# Patient Record
Sex: Female | Born: 1980 | Race: White | Hispanic: No | Marital: Married | Smoking: Former smoker
Health system: Southern US, Community
[De-identification: ages and names within clinical notes are randomized; demographics above are authoritative.]

## PROBLEM LIST (undated history)

## (undated) DIAGNOSIS — G90A Postural orthostatic tachycardia syndrome (POTS): Secondary | ICD-10-CM

## (undated) DIAGNOSIS — I951 Orthostatic hypotension: Secondary | ICD-10-CM

## (undated) DIAGNOSIS — R002 Palpitations: Secondary | ICD-10-CM

## (undated) DIAGNOSIS — E282 Polycystic ovarian syndrome: Secondary | ICD-10-CM

## (undated) DIAGNOSIS — S83209A Unspecified tear of unspecified meniscus, current injury, unspecified knee, initial encounter: Secondary | ICD-10-CM

## (undated) DIAGNOSIS — E785 Hyperlipidemia, unspecified: Secondary | ICD-10-CM

## (undated) DIAGNOSIS — R000 Tachycardia, unspecified: Secondary | ICD-10-CM

## (undated) DIAGNOSIS — Q078 Other specified congenital malformations of nervous system: Secondary | ICD-10-CM

## (undated) DIAGNOSIS — D8940 Mast cell activation, unspecified: Secondary | ICD-10-CM

## (undated) DIAGNOSIS — I498 Other specified cardiac arrhythmias: Secondary | ICD-10-CM

## (undated) DIAGNOSIS — M549 Dorsalgia, unspecified: Secondary | ICD-10-CM

## (undated) DIAGNOSIS — K219 Gastro-esophageal reflux disease without esophagitis: Secondary | ICD-10-CM

## (undated) DIAGNOSIS — R7303 Prediabetes: Secondary | ICD-10-CM

## (undated) DIAGNOSIS — J069 Acute upper respiratory infection, unspecified: Secondary | ICD-10-CM

## (undated) DIAGNOSIS — N19 Unspecified kidney failure: Secondary | ICD-10-CM

## (undated) DIAGNOSIS — G43909 Migraine, unspecified, not intractable, without status migrainosus: Secondary | ICD-10-CM

## (undated) DIAGNOSIS — Q7962 Hypermobile Ehlers-Danlos syndrome: Secondary | ICD-10-CM

## (undated) DIAGNOSIS — F411 Generalized anxiety disorder: Secondary | ICD-10-CM

## (undated) DIAGNOSIS — A609 Anogenital herpesviral infection, unspecified: Secondary | ICD-10-CM

## (undated) HISTORY — DX: Polycystic ovarian syndrome: E28.2

## (undated) HISTORY — DX: Unspecified tear of unspecified meniscus, current injury, unspecified knee, initial encounter: S83.209A

## (undated) HISTORY — DX: Hyperlipidemia, unspecified: E78.5

## (undated) HISTORY — DX: Unspecified kidney failure: N19

## (undated) HISTORY — DX: Dorsalgia, unspecified: M54.9

## (undated) HISTORY — DX: Other specified congenital malformations of nervous system: Q07.8

## (undated) HISTORY — DX: Acute upper respiratory infection, unspecified: J06.9

## (undated) HISTORY — DX: Migraine, unspecified, not intractable, without status migrainosus: G43.909

## (undated) HISTORY — PX: OTHER SURGICAL HISTORY: SHX169

## (undated) HISTORY — DX: Generalized anxiety disorder: F41.1

## (undated) HISTORY — DX: Gastro-esophageal reflux disease without esophagitis: K21.9

## (undated) HISTORY — DX: Palpitations: R00.2

## (undated) HISTORY — DX: Anogenital herpesviral infection, unspecified: A60.9

## (undated) HISTORY — PX: TONSILLECTOMY AND ADENOIDECTOMY: SUR1326

---
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
## 2006-03-25 HISTORY — PX: LUMBAR LAMINECTOMY: SHX95

## 2006-06-21 ENCOUNTER — Ambulatory Visit (HOSPITAL_COMMUNITY): Admission: RE | Admit: 2006-06-21 | Discharge: 2006-06-21 | Payer: Self-pay | Admitting: Occupational Medicine

## 2006-07-02 ENCOUNTER — Encounter: Admission: RE | Admit: 2006-07-02 | Discharge: 2006-07-02 | Payer: Self-pay | Admitting: Neurosurgery

## 2006-07-16 ENCOUNTER — Encounter: Admission: RE | Admit: 2006-07-16 | Discharge: 2006-07-16 | Payer: Self-pay | Admitting: Neurosurgery

## 2006-10-08 ENCOUNTER — Encounter: Admission: RE | Admit: 2006-10-08 | Discharge: 2006-10-08 | Payer: Self-pay | Admitting: Neurosurgery

## 2006-10-10 ENCOUNTER — Encounter: Admission: RE | Admit: 2006-10-10 | Discharge: 2007-01-08 | Payer: Self-pay | Admitting: Neurosurgery

## 2006-12-23 ENCOUNTER — Encounter: Payer: Self-pay | Admitting: Internal Medicine

## 2007-02-23 ENCOUNTER — Ambulatory Visit (HOSPITAL_COMMUNITY): Admission: RE | Admit: 2007-02-23 | Discharge: 2007-02-24 | Payer: Self-pay | Admitting: Neurosurgery

## 2007-05-07 ENCOUNTER — Emergency Department (HOSPITAL_COMMUNITY): Admission: EM | Admit: 2007-05-07 | Discharge: 2007-05-08 | Payer: Self-pay | Admitting: Emergency Medicine

## 2007-07-01 ENCOUNTER — Encounter: Payer: Self-pay | Admitting: Internal Medicine

## 2007-07-01 LAB — CONVERTED CEMR LAB
ALT: 45 units/L
AST: 31 units/L
CO2: 21 meq/L
Chloride: 102 meq/L
Cholesterol: 219 mg/dL
Glucose, Bld: 79 mg/dL
HCT: 39.3 %
HDL: 30 mg/dL
Hemoglobin: 13.8 g/dL
Platelets: 301 10*3/uL
Potassium: 4.1 meq/L
RBC: 4.61 M/uL

## 2008-01-15 ENCOUNTER — Encounter: Payer: Self-pay | Admitting: Internal Medicine

## 2008-01-15 ENCOUNTER — Ambulatory Visit: Payer: Self-pay | Admitting: Internal Medicine

## 2008-01-15 ENCOUNTER — Ambulatory Visit (HOSPITAL_COMMUNITY): Admission: RE | Admit: 2008-01-15 | Discharge: 2008-01-15 | Payer: Self-pay | Admitting: Internal Medicine

## 2008-01-15 LAB — CONVERTED CEMR LAB
Albumin: 4.3 g/dL
Alkaline Phosphatase: 55 units/L
Calcium: 10.3 mg/dL
Creatinine, Ser: 0.64 mg/dL
Glucose, Bld: 85 mg/dL
Platelets: 279 10*3/uL
Sodium: 138 meq/L

## 2008-02-05 ENCOUNTER — Ambulatory Visit: Payer: Self-pay | Admitting: Internal Medicine

## 2008-02-05 ENCOUNTER — Encounter: Payer: Self-pay | Admitting: Internal Medicine

## 2008-02-05 ENCOUNTER — Ambulatory Visit (HOSPITAL_COMMUNITY): Admission: RE | Admit: 2008-02-05 | Discharge: 2008-02-05 | Payer: Self-pay | Admitting: Internal Medicine

## 2008-03-25 HISTORY — PX: OTHER SURGICAL HISTORY: SHX169

## 2008-03-25 LAB — CONVERTED CEMR LAB: Pap Smear: NEGATIVE

## 2008-04-01 ENCOUNTER — Encounter: Payer: Self-pay | Admitting: Internal Medicine

## 2008-04-14 ENCOUNTER — Ambulatory Visit: Payer: Self-pay | Admitting: Internal Medicine

## 2008-04-14 ENCOUNTER — Encounter: Payer: Self-pay | Admitting: Urgent Care

## 2008-04-14 ENCOUNTER — Encounter: Payer: Self-pay | Admitting: Internal Medicine

## 2008-04-14 LAB — CONVERTED CEMR LAB
Alkaline Phosphatase: 60 units/L (ref 39–117)
Total Protein: 7.2 g/dL (ref 6.0–8.3)

## 2008-05-04 ENCOUNTER — Encounter: Payer: Self-pay | Admitting: Internal Medicine

## 2008-05-04 ENCOUNTER — Emergency Department (HOSPITAL_COMMUNITY): Admission: EM | Admit: 2008-05-04 | Discharge: 2008-05-04 | Payer: Self-pay | Admitting: Emergency Medicine

## 2008-05-04 LAB — CONVERTED CEMR LAB
Eosinophils Relative: 1 %
Glucose, Bld: 93 mg/dL
Lymphocytes, automated: 33 %
Neutrophils Relative %: 60 %
Potassium: 4.1 meq/L
WBC: 7.1 10*3/uL

## 2008-05-14 ENCOUNTER — Encounter: Payer: Self-pay | Admitting: Internal Medicine

## 2008-05-24 ENCOUNTER — Ambulatory Visit (HOSPITAL_COMMUNITY): Admission: RE | Admit: 2008-05-24 | Discharge: 2008-05-24 | Payer: Self-pay | Admitting: Neurosurgery

## 2008-06-06 ENCOUNTER — Encounter: Payer: Self-pay | Admitting: Internal Medicine

## 2008-06-06 LAB — CONVERTED CEMR LAB
AST: 21 units/L
Alkaline Phosphatase: 66 units/L
BUN: 14 mg/dL
Cholesterol: 232 mg/dL
Glucose, Bld: 73 mg/dL
HDL: 28 mg/dL
Hemoglobin: 13.6 g/dL
LDL Cholesterol: 128 mg/dL
MCV: 86 fL
Potassium: 4.1 meq/L
RDW: 12.3 %
Sodium: 136 meq/L
Total Bilirubin: 0.3 mg/dL
Total Protein: 7.5 g/dL

## 2008-06-23 ENCOUNTER — Ambulatory Visit: Payer: Self-pay | Admitting: Cardiology

## 2008-07-01 ENCOUNTER — Encounter: Payer: Self-pay | Admitting: Internal Medicine

## 2008-07-14 ENCOUNTER — Ambulatory Visit: Payer: Self-pay

## 2008-07-14 ENCOUNTER — Encounter: Payer: Self-pay | Admitting: Cardiology

## 2008-07-18 ENCOUNTER — Ambulatory Visit: Payer: Self-pay | Admitting: Cardiology

## 2008-08-05 ENCOUNTER — Ambulatory Visit: Payer: Self-pay | Admitting: Cardiology

## 2008-08-31 DIAGNOSIS — E282 Polycystic ovarian syndrome: Secondary | ICD-10-CM | POA: Insufficient documentation

## 2008-08-31 DIAGNOSIS — K219 Gastro-esophageal reflux disease without esophagitis: Secondary | ICD-10-CM

## 2008-08-31 DIAGNOSIS — G8929 Other chronic pain: Secondary | ICD-10-CM | POA: Insufficient documentation

## 2008-08-31 DIAGNOSIS — E1169 Type 2 diabetes mellitus with other specified complication: Secondary | ICD-10-CM | POA: Insufficient documentation

## 2008-08-31 DIAGNOSIS — M549 Dorsalgia, unspecified: Secondary | ICD-10-CM

## 2008-08-31 DIAGNOSIS — E782 Mixed hyperlipidemia: Secondary | ICD-10-CM | POA: Insufficient documentation

## 2008-09-01 ENCOUNTER — Ambulatory Visit: Payer: Self-pay | Admitting: Cardiology

## 2008-09-01 ENCOUNTER — Encounter: Payer: Self-pay | Admitting: Cardiology

## 2008-10-13 ENCOUNTER — Encounter: Admission: RE | Admit: 2008-10-13 | Discharge: 2008-12-15 | Payer: Self-pay | Admitting: Neurosurgery

## 2008-10-21 ENCOUNTER — Encounter: Payer: Self-pay | Admitting: Internal Medicine

## 2008-12-05 ENCOUNTER — Encounter: Payer: Self-pay | Admitting: Internal Medicine

## 2008-12-05 LAB — CONVERTED CEMR LAB
Basophils Relative: 0.1 %
Eosinophils Relative: 1.1 %
Lymphocytes, automated: 43.1 %
Neutrophils Relative %: 50.4 %
RBC: 3.88 M/uL
WBC: 8.2 10*3/uL

## 2008-12-09 ENCOUNTER — Encounter: Payer: Self-pay | Admitting: Gastroenterology

## 2008-12-14 ENCOUNTER — Encounter: Payer: Self-pay | Admitting: Urgent Care

## 2008-12-14 ENCOUNTER — Encounter: Payer: Self-pay | Admitting: Internal Medicine

## 2008-12-21 ENCOUNTER — Encounter: Payer: Self-pay | Admitting: Internal Medicine

## 2008-12-23 ENCOUNTER — Encounter: Payer: Self-pay | Admitting: Internal Medicine

## 2008-12-29 ENCOUNTER — Ambulatory Visit (HOSPITAL_COMMUNITY): Admission: RE | Admit: 2008-12-29 | Discharge: 2008-12-29 | Payer: Self-pay | Admitting: Neurosurgery

## 2009-02-06 ENCOUNTER — Ambulatory Visit (HOSPITAL_COMMUNITY): Admission: RE | Admit: 2009-02-06 | Discharge: 2009-02-07 | Payer: Self-pay | Admitting: Neurosurgery

## 2009-03-25 DIAGNOSIS — Q078 Other specified congenital malformations of nervous system: Secondary | ICD-10-CM

## 2009-03-25 HISTORY — DX: Other specified congenital malformations of nervous system: Q07.8

## 2009-07-07 ENCOUNTER — Telehealth: Payer: Self-pay | Admitting: Cardiology

## 2009-07-10 ENCOUNTER — Ambulatory Visit: Payer: Self-pay | Admitting: Cardiology

## 2009-07-10 DIAGNOSIS — R55 Syncope and collapse: Secondary | ICD-10-CM

## 2009-07-10 DIAGNOSIS — R002 Palpitations: Secondary | ICD-10-CM | POA: Insufficient documentation

## 2009-07-11 ENCOUNTER — Telehealth (INDEPENDENT_AMBULATORY_CARE_PROVIDER_SITE_OTHER): Payer: Self-pay | Admitting: *Deleted

## 2009-07-13 ENCOUNTER — Encounter: Payer: Self-pay | Admitting: Cardiology

## 2009-07-13 ENCOUNTER — Telehealth: Payer: Self-pay | Admitting: Cardiology

## 2009-07-17 ENCOUNTER — Telehealth: Payer: Self-pay | Admitting: Cardiology

## 2009-07-18 ENCOUNTER — Telehealth: Payer: Self-pay | Admitting: Cardiology

## 2009-07-24 ENCOUNTER — Ambulatory Visit (HOSPITAL_COMMUNITY): Admission: RE | Admit: 2009-07-24 | Discharge: 2009-07-24 | Payer: Self-pay | Admitting: Internal Medicine

## 2009-07-24 ENCOUNTER — Ambulatory Visit: Payer: Self-pay | Admitting: Internal Medicine

## 2009-07-25 ENCOUNTER — Telehealth: Payer: Self-pay | Admitting: Cardiology

## 2009-07-28 ENCOUNTER — Ambulatory Visit: Payer: Self-pay | Admitting: Internal Medicine

## 2009-07-28 ENCOUNTER — Ambulatory Visit: Payer: Self-pay | Admitting: Cardiology

## 2009-07-28 DIAGNOSIS — Z9189 Other specified personal risk factors, not elsewhere classified: Secondary | ICD-10-CM | POA: Insufficient documentation

## 2009-07-28 DIAGNOSIS — G43909 Migraine, unspecified, not intractable, without status migrainosus: Secondary | ICD-10-CM | POA: Insufficient documentation

## 2009-07-28 DIAGNOSIS — I10 Essential (primary) hypertension: Secondary | ICD-10-CM | POA: Insufficient documentation

## 2009-07-28 LAB — CONVERTED CEMR LAB
Cholesterol, target level: 200 mg/dL
LDL Goal: 160 mg/dL

## 2009-07-30 DIAGNOSIS — F419 Anxiety disorder, unspecified: Secondary | ICD-10-CM | POA: Insufficient documentation

## 2009-08-03 ENCOUNTER — Encounter: Payer: Self-pay | Admitting: Internal Medicine

## 2009-08-11 ENCOUNTER — Ambulatory Visit: Payer: Self-pay | Admitting: Internal Medicine

## 2009-08-22 ENCOUNTER — Encounter: Payer: Self-pay | Admitting: Internal Medicine

## 2009-09-04 ENCOUNTER — Telehealth (INDEPENDENT_AMBULATORY_CARE_PROVIDER_SITE_OTHER): Payer: Self-pay | Admitting: *Deleted

## 2009-09-06 ENCOUNTER — Ambulatory Visit: Payer: Self-pay | Admitting: Cardiology

## 2009-09-21 ENCOUNTER — Ambulatory Visit: Payer: Self-pay | Admitting: Internal Medicine

## 2009-11-09 ENCOUNTER — Telehealth (INDEPENDENT_AMBULATORY_CARE_PROVIDER_SITE_OTHER): Payer: Self-pay | Admitting: *Deleted

## 2009-11-24 ENCOUNTER — Telehealth: Payer: Self-pay | Admitting: Internal Medicine

## 2009-12-06 ENCOUNTER — Ambulatory Visit: Payer: Self-pay | Admitting: Internal Medicine

## 2009-12-29 ENCOUNTER — Encounter (INDEPENDENT_AMBULATORY_CARE_PROVIDER_SITE_OTHER): Payer: Self-pay | Admitting: *Deleted

## 2009-12-29 ENCOUNTER — Telehealth (INDEPENDENT_AMBULATORY_CARE_PROVIDER_SITE_OTHER): Payer: Self-pay | Admitting: *Deleted

## 2010-01-11 ENCOUNTER — Telehealth: Payer: Self-pay | Admitting: Cardiology

## 2010-01-16 ENCOUNTER — Encounter: Payer: Self-pay | Admitting: Cardiology

## 2010-01-22 ENCOUNTER — Telehealth: Payer: Self-pay | Admitting: Cardiology

## 2010-03-20 ENCOUNTER — Ambulatory Visit: Payer: Self-pay | Admitting: Internal Medicine

## 2010-03-20 ENCOUNTER — Telehealth: Payer: Self-pay | Admitting: Internal Medicine

## 2010-03-20 DIAGNOSIS — N390 Urinary tract infection, site not specified: Secondary | ICD-10-CM

## 2010-03-20 DIAGNOSIS — G901 Familial dysautonomia [Riley-Day]: Secondary | ICD-10-CM

## 2010-03-20 LAB — CONVERTED CEMR LAB
Bilirubin Urine: NEGATIVE
Blood in Urine, dipstick: NEGATIVE
Nitrite: NEGATIVE
Protein, U semiquant: NEGATIVE
Specific Gravity, Urine: <1.005

## 2010-03-21 ENCOUNTER — Encounter: Payer: Self-pay | Admitting: Internal Medicine

## 2010-03-28 ENCOUNTER — Telehealth: Payer: Self-pay | Admitting: Internal Medicine

## 2010-04-14 ENCOUNTER — Encounter: Payer: Self-pay | Admitting: Neurosurgery

## 2010-04-15 ENCOUNTER — Encounter: Payer: Self-pay | Admitting: Occupational Medicine

## 2010-04-24 NOTE — Letter (Signed)
Summary: [employer] - (POTS) Postural Orthostatic Tachycardia Syn  [facility] - (POTS) Postural Orthostatic Tachycardia Syndrome   Imported By: Marylou Mccoy 01/19/2010 09:14:40  _____________________________________________________________________  External Attachment:    Type:   Image     Comment:   External Document

## 2010-04-24 NOTE — Letter (Signed)
Summary: [facility]   Imported By: Sherian Rein 09/13/2009 07:39:58  _____________________________________________________________________  External Attachment:    Type:   Image     Comment:   External Document

## 2010-04-24 NOTE — Letter (Signed)
Summary: [facility]  [facility]   Imported By: Sherian Rein 08/08/2009 13:37:12  _____________________________________________________________________  External Attachment:    Type:   Image     Comment:   External Document
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
## 2010-04-24 NOTE — Letter (Signed)
Summary: [facility]  [facility]   Imported By: Sherian Rein 09/13/2009 07:42:39  _____________________________________________________________________  External Attachment:    Type:   Image     Comment:   External Document

## 2010-04-24 NOTE — Assessment & Plan Note (Signed)
Summary: 2 wk f/u #/ cd   Vital Signs:  Patient profile:   30 year old female Height:      65 inches (165.10 cm) Weight:      209.4 pounds (95.18 kg) O2 Sat:      96 % on Room air Temp:     97.5 degrees F (36.39 degrees C) oral Pulse rate:   79 / minute BP sitting:   120 / 82  (left arm) Cuff size:   regular  Vitals Entered By: Orlan Leavens (Aug 11, 2009 10:15 AM)  O2 Flow:  Room air CC: 2 week follow-up Is Patient Diabetic? No Pain Assessment Patient in pain? no        Primary Care Provider:  Newt Lukes MD  CC:  2 week follow-up.  History of Present Illness: here for followup -  1) depression and anxiety  - describes overwhelming fatigue and excess sleep patterns - 8-16h/day sleep started low dose paxil - no adv se, 100% compliance -  2) PCOS - was on fertility tx for same prio to syncope spells and dx of POTS gyn notes and labs reviewed- interested in metformin will cont to follow with gyn for same - rare period cycle - currently spotting x 10days, but improving - a/w sig weight gain  3) POTS - on atenolol as intol of lopressor and diltiazem- follows with cards for eval and tx of same - next f/u 2 weeks  4) spine DDD - s/p cervical surg for same as well as lumbar laminectomy takes daily NSAIDs for control of pain  5) dyslipidemia - related to PCOS and weight gain never on meds for same dx  Clinical Review Panels:  Prevention   Last Pap Smear:  Interpretation/Result:Negative for intraepithelial Lesion or Malignancy.    (03/25/2008)  Immunizations   Last Tetanus Booster:  Historical (03/25/2008)   Last Flu Vaccine:  Historical (12/23/2008)  CBC   WBC:  8.2 (12/05/2008)   RBC:  3.88 (12/05/2008)   Hgb:  11.7 (12/05/2008)   Hct:  32.7 (12/05/2008)   Platelets:  311 (12/05/2008)   MCV  84.3 (12/05/2008)   RDW  13.2 (12/05/2008)   PMN:  50.4 (12/05/2008)   Monos:  4.6 (12/05/2008)   Eosinophils:  1.1 (12/05/2008)   Basophil:  0.1  (12/05/2008)  Complete Metabolic Panel   Albumin:  4.4 (04/14/2008)   Total Protein:  7.2 (04/14/2008)   Total Bili:  0.4 (04/14/2008)   Alk Phos:  60 (04/14/2008)   SGPT (ALT):  28 (04/14/2008)   SGOT (AST):  21 (04/14/2008)   Current Medications (verified): 1)  Dexilant 60 Mg Cpdr (Dexlansoprazole) .... One By Mouth Daily 2)  Robaxin-750 750 Mg Tabs (Methocarbamol) .Marland Kitchen.. 1 By Mouth Three Times A Day As Needed 3)  Diclofenac Sodium 75 Mg Tbec (Diclofenac Sodium) .Marland Kitchen.. 1 By Mouth Two Times A Day As Needed 4)  Calcium-Vitamin D 600-200 Mg-Unit Tabs (Calcium-Vitamin D) .Marland Kitchen.. 1 By Mouth Two Times A Day 5)  Atenolol 25 Mg Tabs (Atenolol) .Marland Kitchen.. 1 By Mouth Daily 6)  Calcitonin (Salmon) 200 Unit/act Soln (Calcitonin (Salmon)) .Marland Kitchen.. 1 Spray Each Nostril Once Daily 7)  Zyrtec Allergy 10 Mg Tabs (Cetirizine Hcl) .Marland Kitchen.. 1 By Mouth Once Daily 8)  Paxil 10 Mg Tabs (Paroxetine Hcl) .Marland Kitchen.. 1 By Mouth Once Daily  Allergies (verified): No Known Drug Allergies  Past History:  Past Medical History:  1. Polycystic ovarian syndrome.   2. Hyperlipidemia.   3. Chronic back pain/neck pain  4. GERD  5. 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   6. PVCs  7. Hypertension  MD rooster: cards - [facility] nsurg - [facility] - [facility] ([place])  Review of Systems  The patient denies weight loss, chest pain, syncope, peripheral edema, and headaches.    Physical Exam  General:  overweight-appearing.  alert, well-developed, well-nourished, and cooperative to examination.   spouse at side Lungs:  normal respiratory effort, no intercostal retractions or use of accessory muscles; normal breath sounds bilaterally - no crackles and no wheezes.    Heart:  normal rate, regular rhythm, no murmur, and no rub. BLE without edema.  Psych:  Oriented X3, memory intact for recent and remote, normally interactive, good eye contact, not anxious appearing, not depressed appearing, and not agitated.      Impression &  Recommendations:  Problem # 1:  ANXIETY (ICD-300.00)  continue upward titration of SSRI - new erx done for 20mg  paxil Her updated medication list for this problem includes:    Paxil 20 Mg Tabs (Paroxetine hcl) .Marland Kitchen... 1 by mouth once daily  Orders: Prescription Created Electronically [phone])  Problem # 2:  POLYCYSTIC OVARIAN DISEASE (ICD-256.4)  start metformin - new erx done fertility plans on hold at this time gyn records reviewed - will cont to follow there but consider need for endo asst as well  Orders: Prescription Created Electronically [phone])  Problem # 3:  PALPITATIONS (ICD-785.1) POTS tx per cards as ongoing Her updated medication list for this problem includes:    Atenolol 25 Mg Tabs (Atenolol) .Marland Kitchen... 1 by mouth daily  Problem # 4:  SYNCOPE AND COLLAPSE (ICD-780.2) as above, continue low dose of beta blocker to treat this.   POTS - per cards  Complete Medication List: 1)  Dexilant 60 Mg Cpdr (Dexlansoprazole) .... One by mouth daily 2)  Robaxin-750 750 Mg Tabs (Methocarbamol) .Marland Kitchen.. 1 by mouth three times a day as needed 3)  Diclofenac Sodium 75 Mg Tbec (Diclofenac sodium) .Marland Kitchen.. 1 by mouth two times a day as needed 4)  Calcium-vitamin D 600-200 Mg-unit Tabs (Calcium-vitamin d) .Marland Kitchen.. 1 by mouth two times a day 5)  Atenolol 25 Mg Tabs (Atenolol) .Marland Kitchen.. 1 by mouth daily 6)  Calcitonin (salmon) 200 Unit/act Soln (Calcitonin (salmon)) .Marland Kitchen.. 1 spray each nostril once daily 7)  Zyrtec Allergy 10 Mg Tabs (Cetirizine hcl) .Marland Kitchen.. 1 by mouth once daily 8)  Paxil 20 Mg Tabs (Paroxetine hcl) .Marland Kitchen.. 1 by mouth once daily 9)  Metformin Hcl 500 Mg Xr24h-tab (Metformin hcl) .Marland Kitchen.. 1 by mouth two times a day  Patient Instructions: 1)  it was good to see you today. 2)  increase paxil to 20mg  once daily  3)  start metformin as discussed - 4)  your prescriptions have been electronically submitted to your pharmacy. Please take as directed. Contact our office if you believe you're having problems  with the medication(s).  5)  Please schedule a follow-up appointment in 6 weeks, sooner if problems.  Prescriptions: METFORMIN HCL 500 MG XR24H-TAB (METFORMIN HCL) 1 by mouth two times a day  #60 x 3   Entered and Authorized by:   Newt Lukes MD   Signed by:   Newt Lukes MD on 08/11/2009   Method used:   Electronically to        [facility] (retail)       [address] [facility],  [place]  [address]       Ph: [phone]       Fax: [phone]   RxID:   [id number] PAXIL 20 MG TABS (PAROXETINE HCL) 1 by mouth once daily  #30 x 2   Entered and Authorized by:   Newt Lukes MD   Signed by:   Newt Lukes MD on 08/11/2009   Method used:   Electronically to        [facility] (retail)       [address]       [place], [place]  [address]       Ph: [phone]       Fax: [phone]   RxID:   [id number]

## 2010-04-24 NOTE — Assessment & Plan Note (Signed)
Summary: rov/785.1/pla   Visit Type:  Follow-up Primary Provider:  Newt Lukes MD  CC:  Postural Orthostatic Tachycardia.  History of Present Illness: The patient presents for evaluation of presyncope.  She most recently had a tilt test by Dr. Graciela Husbands and had diagnostic criteria for postural orthostatic tachycardia syndrom.  She returns now for treatment of this.  She has not been able to tolerate returning to work.  She denies any frank syncope but she continues to get spells as described previously.  Lipid Management History:      Negative NCEP/ATP III risk factors include female age less than 9 years old, no ASHD (atherosclerotic heart disease), no prior stroke/TIA, and no peripheral vascular disease.    Current Medications (verified): 1)  Cardizem Cd 120 Mg Xr24h-Cap (Diltiazem Hcl Coated Beads) .... One By Mouth Daily 2)  Dexilant 60 Mg Cpdr (Dexlansoprazole) .... One By Mouth Daily 3)  Robaxin-750 750 Mg Tabs (Methocarbamol) .Marland Kitchen.. 1 By Mouth Three Times A Day As Needed 4)  Diclofenac Sodium 75 Mg Tbec (Diclofenac Sodium) .Marland Kitchen.. 1 By Mouth Two Times A Day As Needed 5)  Calcium-Vitamin D 600-200 Mg-Unit Tabs (Calcium-Vitamin D) .Marland Kitchen.. 1 By Mouth Two Times A Day  Allergies (verified): No Known Drug Allergies  Past History:  Past Medical History: Reviewed history from 07/10/2009 and no changes required.  1. Polycystic ovarian syndrome.   2. Hyperlipidemia.   3. Chronic back pain.   4. Gastroesophageal reflux disease.   5. 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   6. PVCs     Past Surgical History: Reviewed history from 07/10/2009 and no changes required.  Lumbar laminectomy.  Cervical Spine Surgery TA  Review of Systems       As stated in the HPI and negative for all other systems.   Vital Signs:  Patient profile:   30 year old female Height:      65 inches Weight:      211 pounds BMI:     35.24 Pulse rate:   99 / minute Resp:     16 per minute BP sitting:    134 / 90  (right arm)  Vitals Entered By: Marrion Coy, CNA (Jul 28, 2009 12:56 PM)  Physical Exam  General:  Well developed, well nourished, in no acute distress. Head:  normocephalic and atraumatic Neck:  Well-healed surgical scar, no thyromegaly Chest Wall:  no deformities or breast masses noted Lungs:  Clear bilaterally to auscultation and percussion. Heart:  Non-displaced PMI, chest non-tender; regular rate and rhythm, S1, S2 without murmurs, rubs or gallops. Carotid upstroke normal, no bruit. Normal abdominal aortic size, no bruits. Femorals normal pulses, no bruits. Pedals normal pulses. No edema, no varicosities. Abdomen:  Bowel sounds positive; abdomen soft and non-tender without masses, organomegaly, or hernias noted. No hepatosplenomegaly. Neurologic:  Alert and oriented x 3. Psych:  Normal affect.   Impression & Recommendations:  Problem # 1:  SYNCOPE AND COLLAPSE (ICD-780.2) Today I will try again a low dose of beta blocker to treat this.  I will start with Atenolol 25 mg nightly.  She will discontinue the diltiazem.  I have also called and discussed her case with Dr. Felicity Coyer.  She will be seen and the anxiety/panic component will be addressed by Dr. Felicity Coyer with possibly an SSRI.  Problem # 2:  PALPITATIONS (ICD-785.1) We will continue with beta blocker.  Lipid Assessment/Plan:      Based on NCEP/ATP III, the patient's risk factor category is "0-1 risk  factors".  The patient's lipid goals are as follows: Total cholesterol goal is 200; LDL cholesterol goal is 160; HDL cholesterol goal is 40; Triglyceride goal is 150.     Patient Instructions: 1)  Your physician recommends that you schedule a follow-up appointment in: 1 month 2)  Your physician has recommended you make the following change in your medication: Start atenolol 25mg  1 by mouth daily,STOP Cardizem 3)  You have been referred to: Dr. Rene Paci Primary care Physician Prescriptions: ATENOLOL 25 MG TABS  (ATENOLOL) 1 by mouth daily  #30 x 11   Entered by:   Marrion Coy, CNA   Authorized by:   Rollene Rotunda, MD, [facility]   Signed by:   Marrion Coy, CNA on 07/28/2009   Method used:   Electronically to        [facility] (retail)       [address]       [place], [place]  [address]       Ph: [phone]       Fax: [phone]   RxID:   [id number]   Appended Document: rov/785.1/pla per Christy-pt left before being told to D/C Cardizem--NA at [phone]  Appended Document: rov/785.1/pla na [phone]/ pt not at [phone]  Appended Document: rov/785.1/pla Spoke with Mrs. Burnette and she was aware to discontinue Cardizem. Lisabeth Devoid RN

## 2010-04-24 NOTE — Progress Notes (Signed)
Summary: status of disability claim form  Phone Note From Other Clinic   Caller: ashley from [facility]  [phone] Request: Talk with Nurse Details of Complaint: checking on disability claim form letter sent on 9/14.  Initial call taken by: Lorne Skeens,  January 11, 2010 3:19 PM  Follow-up for Phone Call        Pt calling [facility] regarding paper work call back at [phone] [employer]  January 12, 2010 9:03 AM

## 2010-04-24 NOTE — Letter (Signed)
Summary: [facility]  [facility]   Imported By: Sherian Rein 08/08/2009 13:34:01  _____________________________________________________________________  External Attachment:    Type:   Image     Comment:   External Document

## 2010-04-24 NOTE — Procedures (Signed)
Summary: EGD/[facility]  EGD/[facility]   Imported By: Sherian Rein 09/13/2009 07:41:04  _____________________________________________________________________  External Attachment:    Type:   Image     Comment:   External Document

## 2010-04-24 NOTE — Progress Notes (Signed)
Summary: no improvement in S/S with compressions stockings  Phone Note Call from Patient Call back at Home Phone [phone]   Caller: Patient Reason for Call: Talk to Nurse Summary of Call: request to speak to nurse Initial call taken by: Migdalia Dk,  July 18, 2009 9:54 AM  Follow-up for Phone Call        Spoke with pt who feels as though wearing the compression stockings has not helped - infact made right foot swell.  She has seen no improvement in her s/s.  The dizziness and weakness continue and now she is having her heart to race and bound in her temples when is is going up the stairs.  She has noticed this the last couple of days. This resolves her rest. Reviewed the results of BMP - pt c/o having muscle cramps in her neck, side and foot after being told her potassium was 3.6 and that she may want to increase foods high in potassium.  Will forward information to Dr Antoine Poche was his review and futher orders. Follow-up by: Charolotte Capuchin, RN,  July 18, 2009 2:13 PM

## 2010-04-24 NOTE — Letter (Signed)
Summary: Generic Letter/ reviewed on phone and mailed  [facility], Main Office  [address]   [place], [place] [address]   Phone: [phone]  Fax: [phone]          July 13, 2009 MRN: [id number]      [facility] [address] [place], [place]  [address]      Dear Ms. Pevehouse-BURNETTE,  You are scheduled for a tilt table test on Jul 24, 2009 with Dr. Graciela Husbands.  Please arrive at the [facility] Stay [facility] of [facility] at 5:30 am. You will be prepped for the test which will be done at 7:30am. Do Not Eat or Drink after Midnight.      Sincerely,    Charolotte Capuchin, RN for Dr. Rollene Rotunda  This letter has been electronically signed by your physician.

## 2010-04-24 NOTE — Progress Notes (Signed)
Summary: ?LABS/TILT TABLE TEST  Phone Note Call from Patient Call back at Home Phone [phone]   Caller: Patient Call For: nurse Summary of Call: Patient called office and wanted to know when she is supposed to have bloodwork and tilt table testing done. Initial call taken by: Carlye Grippe,  July 11, 2009 3:19 PM  Follow-up for Phone Call        saw Hochrein 4/18 will forward mess to [facility], RN  July 11, 2009 3:26 PM   Scheduled 07/24/2009  7:30 am Follow-up by: Charolotte Capuchin, RN,  July 13, 2009 2:23 PM

## 2010-04-24 NOTE — Medication Information (Signed)
Summary: Physician Orders   Physician Orders   Imported By: Roderic Ovens 08/10/2009 16:33:34  _____________________________________________________________________  External Attachment:    Type:   Image     Comment:   External Document

## 2010-04-24 NOTE — Progress Notes (Signed)
  Phone Note Other Incoming   Request: Send information Summary of Call: Request received from [facility] forwarded to [employer].

## 2010-04-24 NOTE — Progress Notes (Signed)
Summary: Disability  Phone Note From Other Clinic   Caller: Harmon Pier term disabilty [phone] Summary of Call: [place] disabilty called. 1. Was paperwork recieved for pt's disability? Were these completed?  2. Has pt been seen by MD since 6/15?  Initial call taken by: Lamar Sprinkles, CMA,  January 11, 2010 3:45 PM  Follow-up for Phone Call        1) if papers recieved and done, it would be scanned into our records - cardiology is managing her disability as it is dr. Antoine Poche who pulled her from work for cardiac reasons 2) OV with me in EMR 12/06/09 Follow-up by: Newt Lukes MD,  January 12, 2010 8:30 AM  Additional Follow-up for Phone Call Additional follow up Details #1::        Pt calling to check on disability papers Judie Grieve  January 16, 2010 3:50 PM  When calling back to the about number was told I have the wrong number. Dr Antoine Poche has the paperwork and is working on it.  Letters the pt sent in have been put on letterhead and ready for him to sign. Additional Follow-up by: Charolotte Capuchin, RN,  January 16, 2010 4:58 PM

## 2010-04-24 NOTE — Progress Notes (Signed)
Summary: Patients Blood Pressure Vitals  Patients Blood Pressure Vitals   Imported By: Roderic Ovens 07/20/2009 14:52:20  _____________________________________________________________________  External Attachment:    Type:   Image     Comment:   External Document

## 2010-04-24 NOTE — Progress Notes (Signed)
Summary: disability fax paperwork to [phone]  Phone Note Call from Patient Call back at Home Phone [phone] Call back at [phone] cell phone   Caller: Patient Reason for Call: Talk to Nurse Summary of Call: Per pt calling back, left several message for nurse to call her- disability Initial call taken by: Lorne Skeens,  January 22, 2010 8:33 AM  Follow-up for Phone Call        left message for pt again - letters areon letterhead and ready to be picked up - he also has paperwork filled out and I have been trying to contact [facility] but am told I have the wrong number.  Requested pt call back tomorrow  Sander Nephew, RN  [phone]  fax number

## 2010-04-24 NOTE — Progress Notes (Signed)
Summary: follow up questions  Phone Note Call from Patient Call back at Home Phone [phone]   Caller: Patient Reason for Call: Talk to Nurse Summary of Call: has some questions to follow up from visit earlier this week Initial call taken by: Migdalia Dk,  July 13, 2009 12:16 PM  Follow-up for Phone Call        Needs to be scheduled for tilt table test ASAP Needs Lab work Needs to est Dr Rene Paci  (order sent to [facility] to schedule) Still dizzy with position changes gets tired easy and has to take frequent breaks d/t fatigue.  Scheduled for tilt table test on Jul 24, 2009 with Dr Graciela Husbands at [facility], pt to report to [facility] at 5:30 for a 7:30 am test. Follow-up by: Charolotte Capuchin, RN,  July 13, 2009 2:08 PM  Additional Follow-up for Phone Call Additional follow up Details #1::        pt is going to try compression stockings over the weekend and if they help enough that she feels comfortable going back to work she will call for a note.  She works evenings at [employer]  Lab order faxed to [facility] Additional Follow-up by: Charolotte Capuchin, RN,  July 13, 2009 2:53 PM    New/Updated Medications: JOBST KNEE HIGH COMPRESSION SM  MISC (ELASTIC BANDAGES & SUPPORTS) 20 to 30 mm/hg:  wear daily during waking hours Prescriptions: JOBST KNEE HIGH COMPRESSION SM  MISC (ELASTIC BANDAGES & SUPPORTS) 20 to 30 mm/hg:  wear daily during waking hours  #1 x 0   Entered by:   Charolotte Capuchin, RN   Authorized by:   Rollene Rotunda, MD, [facility]   Signed by:   Charolotte Capuchin, RN on 07/13/2009   Method used:   Electronically to        [facility]* (retail)       [address]       [place], [place]  [address]       Ph: [phone]       Fax: [phone]   RxID:   [id number]

## 2010-04-24 NOTE — Progress Notes (Signed)
Summary: passed out  Phone Note Call from Patient Call back at Home Phone [phone]   Caller: Patient Summary of Call: passed out 3 times in the past month, having headaches, dizzy, nausea.... has seen neuro, neuro doesn't seem to think its neuro Initial call taken by: Migdalia Dk,  July 07, 2009 8:46 AM  Follow-up for Phone Call        per pt for a couple of months she has had dizziness and nausea that has been off and on.  Just didn't feel good. about 3 weeks ago she "had the flu" and was dehydrated and passed out going down the stairs at her home.  2 weeks ago while standing in her kitchen she reports "falling out".  she has also been dizzy at work and had to sit down because she felt faint. 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  She has also been having headache for days at a time that she wakes with.  the headaches are both in the frontal area as well as the occipital area.  The headaches and dizziness make her feel disconnected from herself.  Encouraged pt to increase fluid intake, change positions slowly, not stand in one place for too long.  She is concerned about working and driving and has been advised not to do either until she is seen on Monday.  She knows if her s/s become worse - she is to call or go to ED. Follow-up by: Charolotte Capuchin, RN,  July 07, 2009 12:20 PM

## 2010-04-24 NOTE — Assessment & Plan Note (Signed)
Summary: NEW [employer] PT--PKG/OFF--#--STC   Vital Signs:  Patient profile:   30 year old female Height:      65 inches (165.10 cm) Weight:      210.8 pounds (95.82 kg) O2 Sat:      99 % on Room air Temp:     98.3 degrees F (36.83 degrees C) oral Pulse rate:   91 / minute BP sitting:   120 / 84  (left arm) Cuff size:   large  Vitals Entered By: Orlan Leavens (Jul 28, 2009 2:03 PM)  O2 Flow:  Room air CC: New patient Is Patient Diabetic? No Pain Assessment Patient in pain? no        Primary Care Provider:  Newt Lukes MD  CC:  New patient.  History of Present Illness: new pt to me and our division, here to est care  concerned about depression and anxiety symptoms - onset over 6 moa ago describes symptoms aspalpitations and light headness assoc with panic and hard to focus better with biofeedback techniques prev on paxil with good control of symptoms - weaned self off  2) PCOS - was on fertility tx for same prio to syncope spells and dx of POTS never on metformin follows with gyn for same - rare period cycle a/w sig weight gain  3) POTS - just began on cardizem as intol of lopressor - follows with cards for eval and tx of same  4) spine DDD - s/p cervical surg for same as well as lumbar laminectomy takes dailt NSAIDs for control of pain  5) dys;ipidemia - related to PCOS and weight gain never on meds for same dx  Preventive Screening-Counseling & Management  Alcohol-Tobacco     Alcohol drinks/day: <1     Alcohol Counseling: not indicated; use of alcohol is not excessive or problematic  Caffeine-Diet-Exercise     Nutrition Referrals: no     Does Patient Exercise: no     Exercise Counseling: to improve exercise regimen     Depression Counseling: further diagnostic testing and/or other treatment is indicated  Safety-Violence-Falls     Seat Belt Counseling: not indicated; patient wears seat belts     Helmet Counseling: not applicable     Firearm  Counseling: not indicated; uses recommended firearm safety measures     Violence Counseling: not indicated; no violence risk noted     Sexual Abuse Counseling: n/a     Fall Risk Counseling: not indicated; no significant falls noted  Clinical Review Panels:  Prevention   Last Pap Smear:  Interpretation/Result:Negative for intraepithelial Lesion or Malignancy.    (03/25/2008)  Immunizations   Last Tetanus Booster:  Historical (03/25/2008)   Last Flu Vaccine:  Historical (12/23/2008)  Complete Metabolic Panel   Albumin:  4.4 (04/14/2008)   Total Protein:  7.2 (04/14/2008)   Total Bili:  0.4 (04/14/2008)   Alk Phos:  60 (04/14/2008)   SGPT (ALT):  28 (04/14/2008)   SGOT (AST):  21 (04/14/2008)   Current Medications (verified): 1)  Dexilant 60 Mg Cpdr (Dexlansoprazole) .... One By Mouth Daily 2)  Robaxin-750 750 Mg Tabs (Methocarbamol) .Marland Kitchen.. 1 By Mouth Three Times A Day As Needed 3)  Diclofenac Sodium 75 Mg Tbec (Diclofenac Sodium) .Marland Kitchen.. 1 By Mouth Two Times A Day As Needed 4)  Calcium-Vitamin D 600-200 Mg-Unit Tabs (Calcium-Vitamin D) .Marland Kitchen.. 1 By Mouth Two Times A Day 5)  Atenolol 25 Mg Tabs (Atenolol) .Marland Kitchen.. 1 By Mouth Daily 6)  Calcitonin (Salmon) 200 Unit/act  Soln (Calcitonin (Salmon)) .Marland Kitchen.. 1 Spray Each Nostril Once Daily  Allergies (verified): No Known Drug Allergies  Past History:  Past medical, surgical, family and social histories (including risk factors) reviewed, and no changes noted (except as noted below).  Past Medical History:  1. Polycystic ovarian syndrome.   2. Hyperlipidemia.   3. Chronic back pain.   4. GERD  5. 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   6. PVCs  7. Hypertension  MD rooster: cards - hochrien nsurg - nudleman gyn - buist ([place])  Past Surgical History:  Lumbar laminectomy (2008)  Cervical Spine Surgery - ACDF c5-6 (2010) TA  Family History: Reviewed history from 08/31/2008 and no changes required.  Positive for maternal grandmother  diagnosed with colon   cancer in her 56s.  Otherwise, no known family history of inflammatory  bowel disease, colorectal carcinoma, or other chronic GI problems.  Mother is healthy, prior CVA age 5y..   Father deceased at 22.  He had a history of heart  disease and died secondary to an accident.  She has 2 healthy sisters.      Social History: Reviewed history from 08/31/2008 and no changes required.  Ms. Shambaugh has been married for 6 months.  She has   been an Charity fundraiser for 3 years.  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  She  works night shift.  She has a history of smoking about a pack of  cigarettes a week for about 10 years, but currently does not smoke.  She  consumes a couple of alcoholic beverages per month and denies any drug  use.  Does Patient Exercise:  no  Review of Systems       see HPI above. I have reviewed all other systems and they were negative.   Physical Exam  General:  overweight-appearing.  alert, well-developed, well-nourished, and cooperative to examination.   spouse at side Eyes:  vision grossly intact; pupils equal, round and reactive to light.  conjunctiva and lids normal.   wears glasses Ears:  normal pinnae bilaterally, without erythema, swelling, or tenderness to palpation. TMs clear, without effusion, or cerumen impaction. Hearing grossly normal bilaterally  Mouth:  teeth and gums in good repair; mucous membranes moist, without lesions or ulcers. oropharynx clear without exudate, erythema.  Neck:  supple, full ROM, no masses, no thyromegaly; no thyroid nodules or tenderness. no JVD or carotid bruits.   Lungs:  normal respiratory effort, no intercostal retractions or use of accessory muscles; normal breath sounds bilaterally - no crackles and no wheezes.    Heart:  normal rate, regular rhythm, no murmur, and no rub. BLE without edema. normal DP pulses and normal cap refill in all 4 extremities    Abdomen:  soft, non-tender, normal bowel sounds, no distention; no  masses and no appreciable hepatomegaly or splenomegaly.   Msk:  No deformity or scoliosis noted of thoracic or lumbar spine.   Neurologic:  alert & oriented X3 and cranial nerves II-XII symetrically intact.  strength normal in all extremities, sensation intact to light touch, and gait normal. speech fluent without dysarthria or aphasia; follows commands with good comprehension.  Skin:  no rashes, vesicles, ulcers, or erythema. No nodules or irregularity to palpation.  Psych:  Oriented X3, memory intact for recent and remote, normally interactive, good eye contact, not anxious appearing, not depressed appearing, and not agitated.      Impression & Recommendations:  Problem # 1:  ANXIETY (ICD-300.00)  star low dose paxil - titrate  to lowest effective dose  - new erx done also on tx for new dx POTS - per cards Her updated medication list for this problem includes:    Paxil 10 Mg Tabs (Paroxetine hcl) .Marland Kitchen... 1 by mouth once daily  Orders: Prescription Created Electronically [phone])  Problem # 2:  POLYCYSTIC OVARIAN DISEASE (ICD-256.4) consider metformin fertility plans on hold at this time send for records  Problem # 3:  SYNCOPE AND COLLAPSE (ICD-780.2) per cards today: Today I will try again a low dose of beta blocker to treat this.  I will start with Atenolol 25 mg nightly.  She will discontinue the diltiazem.  I have also called and discussed her case with Dr. Felicity Coyer.  She will be seen and the anxiety/panic component will be addressed by Dr. Felicity Coyer with possibly an SSRI.  Problem # 4:  BACK PAIN, CHRONIC (ICD-724.5) rior neck and back surg reviewed - cont nsaids - f/u nudelman as ongoing Her updated medication list for this pproblem includes:    Robaxin-750 750 Mg Tabs (Methocarbamol) .Marland Kitchen... 1 by mouth three times a day as needed    Diclofenac Sodium 75 Mg Tbec (Diclofenac sodium) .Marland Kitchen... 1 by mouth two times a day as needed  Problem # 5:  HYPERLIPIDEMIA (ICD-272.4)  send for  records - related to pcos per pt  Labs Reviewed: SGOT: 21 (04/14/2008)   SGPT: 28 (04/14/2008)  Lipid Goals: Chol Goal: 200 (07/28/2009)   HDL Goal: 40 (07/28/2009)   LDL Goal: 160 (07/28/2009)   TG Goal: 150 (07/28/2009)  Prior 10 Yr Risk Heart Disease: Not enough information (09/01/2008)  Complete Medication List: 1)  Dexilant 60 Mg Cpdr (Dexlansoprazole) .... One by mouth daily 2)  Robaxin-750 750 Mg Tabs (Methocarbamol) .Marland Kitchen.. 1 by mouth three times a day as needed 3)  Diclofenac Sodium 75 Mg Tbec (Diclofenac sodium) .Marland Kitchen.. 1 by mouth two times a day as needed 4)  Calcium-vitamin D 600-200 Mg-unit Tabs (Calcium-vitamin d) .Marland Kitchen.. 1 by mouth two times a day 5)  Atenolol 25 Mg Tabs (Atenolol) .Marland Kitchen.. 1 by mouth daily 6)  Calcitonin (salmon) 200 Unit/act Soln (Calcitonin (salmon)) .Marland Kitchen.. 1 spray each nostril once daily 7)  Zyrtec Allergy 10 Mg Tabs (Cetirizine hcl) .Marland Kitchen.. 1 by mouth once daily 8)  Paxil 10 Mg Tabs (Paroxetine hcl) .Marland Kitchen.. 1 by mouth once daily   Patient Instructions: 1)  it was good to see you today. 2)  start low dose paxil to help control underlying anxiety symptoms - your prescription has been electronically submitted to your pharmacy. Please take as directed. Contact our office if you believe you're having problems with the medication(s). 3)  will send for prior labs and records from dr. Willaim Bane and [facility] to review 4)  Please schedule a follow-up appointment in 2 weeks to continue discussion, call sooner if problems.  Prescriptions: PAXIL 10 MG TABS (PAROXETINE HCL) 1 by mouth once daily  #30 x 0   Entered and Authorized by:   Newt Lukes MD   Signed by:   Newt Lukes MD on 07/28/2009   Method used:   Electronically to        [facility]* (retail)       [address]       [place], [place]  [address]       Ph: [phone]       Fax: [phone]   RxID:   [id number]  Pap Smear  Procedure date:   03/25/2008  Findings:      Interpretation/Result:Negative for intraepithelial Lesion or Malignancy.       Immunization History:  Tetanus/Td Immunization History:    Tetanus/Td:  historical (03/25/2008)  Influenza Immunization History:    Influenza:  historical (12/23/2008)

## 2010-04-24 NOTE — Progress Notes (Signed)
Summary: PT CALLING REGARDING GOING BACK TO WORK  Phone Note Call from Patient Call back at Home Phone [phone]   Caller: Patient Summary of Call: PT CALLING REGARDING BACK TO WORK AND WAS TOLD TO CALL BACK TODAY Initial call taken by: Judie Grieve,  July 17, 2009 10:01 AM  Follow-up for Phone Call        per pt--talked with Pam last  Thursday--pt was told if she wore compression stockings over the weekend  and they helped she could go back to work tonight--she works evenings at 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

## 2010-04-24 NOTE — Assessment & Plan Note (Signed)
Summary: rov per pt call/lg   Visit Type:  Follow-up Primary Provider:  Newt Lukes MD  CC:  Pre syncope.  History of Present Illness: The  and palpitations that have been described elsewhere. She had a tilt table test as reported previously. She is now being managed with a low dose of beta blocker and is also being treated with Paxil for her primary physician. She thinks she's had some improvement in her symptoms and that her tachycardia palpitations or not as severe. Her heart rates and running lower than would still jumps off sporadically or with activities. She has modified her lifestyle so as to avoid any situations where she might be standing excessively and had syncope. She's had presyncope but no frank syncope. She unfortunately he is sleeping 12-17 hours a day. Also had some nausea. She's not had any chest pain or shortness of breath.  Current Medications (verified): 1)  Dexilant 60 Mg Cpdr (Dexlansoprazole) .... One By Mouth Daily 2)  Robaxin-750 750 Mg Tabs (Methocarbamol) .Marland Kitchen.. 1 By Mouth Three Times A Day As Needed 3)  Diclofenac Sodium 75 Mg Tbec (Diclofenac Sodium) .Marland Kitchen.. 1 By Mouth Two Times A Day As Needed 4)  Calcium-Vitamin D 600-200 Mg-Unit Tabs (Calcium-Vitamin D) .Marland Kitchen.. 1 By Mouth Two Times A Day 5)  Atenolol 25 Mg Tabs (Atenolol) .Marland Kitchen.. 1 By Mouth Daily 6)  Calcitonin (Salmon) 200 Unit/act Soln (Calcitonin (Salmon)) .Marland Kitchen.. 1 Spray Each Nostril Once Daily 7)  Zyrtec Allergy 10 Mg Tabs (Cetirizine Hcl) .Marland Kitchen.. 1 By Mouth Once Daily 8)  Paxil 20 Mg Tabs (Paroxetine Hcl) .Marland Kitchen.. 1 By Mouth Once Daily 9)  Metformin Hcl 500 Mg Xr24h-Tab (Metformin Hcl) .Marland Kitchen.. 1 By Mouth Two Times A Day  Allergies (verified): No Known Drug Allergies  Past History:  Past Medical History:  1. Polycystic ovarian syndrome.   2. Hyperlipidemia.   3. Chronic back pain/neck pain  4. GERD  5. 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   6. PVCs  7. Hypertension  MD roster cards - hochrien nsurg -  [facility]udleman gyn - buist ([place])  Past Surgical History: Lumbar laminectomy (2008)  Cervical Spine Surgery - ACDF c5-6 (2010) TA  Review of Systems       As stated in the HPI and negative for all other systems.   Vital Signs:  Patient profile:   30 year old female Height:      65 inches Weight:      206 pounds BMI:     34.40 Pulse rate:   92 / minute Resp:     16 per minute BP sitting:   106 / 76  (right arm)  Vitals Entered By: Marrion Coy, CNA (September 06, 2009 2:13 PM)  Physical Exam  General:  Well developed, well nourished, in no acute distress. Head:  normocephalic and atraumatic Neck:  Neck supple, no JVD. No masses, thyromegaly or abnormal cervical nodes. Chest Wall:  no deformities or breast masses noted Lungs:  Clear bilaterally to auscultation and percussion. Heart:  Non-displaced PMI, chest non-tender; regular rate and rhythm, S1, S2 without murmurs, rubs or gallops. Carotid upstroke normal, no bruit. Normal abdominal aortic size, no bruits. Femorals normal pulses, no bruits. Pedals normal pulses. No edema, no varicosities. Abdomen:  Bowel sounds positive; abdomen soft and non-tender without masses, organomegaly, or hernias noted. No hepatosplenomegaly. Msk:  Back normal, normal gait. Muscle strength and tone normal. Pulses:  pulses normal in all 4 extremities Extremities:  No clubbing or cyanosis. Neurologic:  Alert and  oriented x 3. Skin:  Intact without lesions or rashes. Cervical Nodes:  no significant adenopathy Axillary Nodes:  no significant adenopathy Inguinal Nodes:  no significant adenopathy Psych:  Normal affect.   Impression & Recommendations:  Problem # 1:  Postural Orthostatic Hypotension The patient is slightly improved on Paxil and Tenormin. I cannot titrate that to normal and because of low heart rates and blood pressures. Lifestyle modifications are important. Continue treatment by her primary physician also is important. She will be given a  letter stating that with this condition and her current symptoms she is unable to return to work. Of note I will give her a limited prescription for Phenergan to treat nausea. Renewals would need to come from her primary physician.  Problem # 2:  HYPERTENSION (ICD-401.9)  Currently her blood pressure is running low and this will be managed in the context of treating the above.  Her updated medication list for this problem includes:    Atenolol 25 Mg Tabs (Atenolol) .Marland Kitchen... 1 by mouth daily  Patient Instructions: 1)  Your physician recommends that you schedule a follow-up appointment in: 1 yr with Dr Antoine Poche 2)  Your physician has recommended you make the following change in your medication: May take phenergan 12.5 mg as needed for nausea Prescriptions: PROMETHAZINE HCL 12.5 MG TABS (PROMETHAZINE HCL) as needed for nausea  #30 x 0   Entered by:   Charolotte Capuchin, RN   Authorized by:   Rollene Rotunda, MD, [facility]   Signed by:   Charolotte Capuchin, RN on 09/06/2009   Method used:   Electronically to        [facility] (retail)       [address]       [place], [place]  [address]       Ph: [phone]       Fax: [phone]   RxID:   [id number]

## 2010-04-24 NOTE — Miscellaneous (Signed)
Summary: needs labs and ? tilt table  Clinical Lists Changes  Orders: Added new Test order of TLB-Magnesium (Mg) (83735-MG) - Signed Added new Test order of TLB-TSH (Thyroid Stimulating Hormone) (84443-TSH) - Signed Added new Test order of TLB-BMP (Basic Metabolic Panel-BMET) (80048-METABOL) - Signed

## 2010-04-24 NOTE — Progress Notes (Signed)
  Recieved [employer] papers back from [employer]...sent to [facility]  September 04, 2009 10:28 AM

## 2010-04-24 NOTE — Letter (Signed)
Summary: out of work  [facility]  [address]   [place], [place] [address]   Phone: [phone]  Fax: [phone]        09/06/2009    Wyman Songster [phone] fax number   RE: Kayla Floyd Amon-BURNETTE [address] [id number]     The above named individual is under my medical care and will be out of work for an undetermined amount of time.  If you have any further questions or need additional information, please call.       Sincerely,     Charolotte Capuchin, RN for Dr Rollene Rotunda

## 2010-04-24 NOTE — Progress Notes (Signed)
Summary: pt wants to talk about tilt table  Phone Note Call from Patient Call back at [phone]   Caller: Patient Reason for Call: Talk to Nurse, Talk to Doctor Summary of Call: pt had tilt table yesterday and she wants to talk to you about it Initial call taken by: Omer Jack,  Jul 25, 2009 11:49 AM  Follow-up for Phone Call        had tilt table test. SCK to discuss with Dr Antoine Poche.pt needs to know what changes to be made ei.. changing to a beta blocker.  States over the weekend she had an episode where her was driving in a town she has lived in for 30 yrs and "got lost"  states she was just turning onto roads that " looked familiar until she got to where she was going". Pt returning call want to know whats going on Judie Grieve  Jul 26, 2009 1:45 PM  pt called again because she had not heard from anyone and needs to know her next step Omer Jack  Jul 27, 2009 12:21 PM  Follow-up by: Charolotte Capuchin, RN,  Jul 25, 2009 3:39 PM  Additional Follow-up for Phone Call Additional follow up Details #1::        Pt. has questions regarding treatment plan after tilt table test. States she is geting anxious about this and would like to know what next step is. Will forward note to Dr. Antoine Poche. Dossie Arbour, RN, BSN  Jul 27, 2009 12:42 PM Discussed with Dr. Antoine Poche. He would like to see pt in office tomorrow at 1:00. Pt notified and she states she will be here. Additional Follow-up by: Dossie Arbour, RN, BSN,  Jul 27, 2009 2:46 PM

## 2010-04-24 NOTE — Assessment & Plan Note (Signed)
Summary: [name]  #   Vital Signs:  Patient profile:   30 year old female Height:      65 inches (165.10 cm) Weight:      204.0 pounds (92.73 kg) O2 Sat:      98 % on Room air Temp:     98.2 degrees F (36.78 degrees C) oral Pulse rate:   81 / minute BP sitting:   114 / 76  (left arm) Cuff size:   regular  Vitals Entered By: Kayla Floyd RMA (December 06, 2009 3:09 PM)  O2 Flow:  Room air CC: follow-up visit Is Patient Diabetic? No Pain Assessment Patient in pain? no        Primary Care Provider:  Newt Lukes MD  CC:  follow-up visit.  History of Present Illness: here for followup -  1) depression and anxiety  - continued overwhelming fatigue and excess sleep patterns -  tol generic paxil - no adv se, 100% compliance - not sure if she feels better  2) PCOS - was on fertility tx for same prio to syncope spells and dx of POTS now on BCP to help control bleeding also on metformin - no adv SE will cont to follow with gyn for same - prev a/w sig weight gain, now stable  3) POTS - on low dose atenolol as intol of lopressor and diltiazem- follows with cards for eval and tx of same - next f/u 56mo per last OV note has been recommended by cards to remain out of work and pursue disability? pt and insurance requesting help with this process still orthosatic dizzy symptoms and only showers if someone in house to supervise for poss falss - no further syncope events not driving  4) spine DDD - s/p cervical surg for same as well as lumbar laminectomy takes daily NSAIDs for control of pain  5) dyslipidemia - related to PCOS and weight gain never on meds for same dx  Clinical Review Panels:  Lipid Management   Cholesterol:  232 (06/06/2008)   LDL (bad choesterol):  128 (06/06/2008)   HDL (good cholesterol):  28 (06/06/2008)   Triglycerides:  378 (06/06/2008)  CBC   WBC:  8.2 (12/05/2008)   RBC:  3.88 (12/05/2008)   Hgb:  11.7 (12/05/2008)   Hct:  32.7  (12/05/2008)   Platelets:  311 (12/05/2008)   MCV  84.3 (12/05/2008)   RDW  13.2 (12/05/2008)   PMN:  50.4 (12/05/2008)   Monos:  4.6 (12/05/2008)   Eosinophils:  1.1 (12/05/2008)   Basophil:  0.1 (12/05/2008)  Complete Metabolic Panel   Glucose:  73 (06/06/2008)   Sodium:  136 (06/06/2008)   Potassium:  4.1 (06/06/2008)   Chloride:  101 (06/06/2008)   CO2:  19 (06/06/2008)   BUN:  14 (06/06/2008)   Creatinine:  0.78 (06/06/2008)   Albumin:  4.5 (06/06/2008)   Total Protein:  7.5 (06/06/2008)   Calcium:  9.2 (06/06/2008)   Total Bili:  0.3 (06/06/2008)   Alk Phos:  66 (06/06/2008)   SGPT (ALT):  27 (06/06/2008)   SGOT (AST):  21 (06/06/2008)   Current Medications (verified): 1)  Dexilant 60 Mg Cpdr (Dexlansoprazole) .... One By Mouth Daily 2)  Robaxin-750 750 Mg Tabs (Methocarbamol) .Marland Kitchen.. 1 By Mouth Three Times A Day As Needed 3)  Diclofenac Sodium 75 Mg Tbec (Diclofenac Sodium) .Marland Kitchen.. 1 By Mouth Two Times A Day As Needed 4)  Calcium-Vitamin D 600-200 Mg-Unit Tabs (Calcium-Vitamin D) .Marland Kitchen.. 1 By Mouth  Two Times A Day 5)  Atenolol 25 Mg Tabs (Atenolol) .Marland Kitchen.. 1 By Mouth Daily 6)  Calcitonin (Salmon) 200 Unit/act Soln (Calcitonin (Salmon)) .Marland Kitchen.. 1 Spray Each Nostril Once Daily 7)  Zyrtec Allergy 10 Mg Tabs (Cetirizine Hcl) .Marland Kitchen.. 1 By Mouth Once Daily 8)  Paxil 20 Mg Tabs (Paroxetine Hcl) .Marland Kitchen.. 1 By Mouth Once Daily 9)  Metformin Hcl 500 Mg Xr24h-Tab (Metformin Hcl) .Marland Kitchen.. 1 By Mouth Two Times A Day 10)  Promethazine Hcl 12.5 Mg Tabs (Promethazine Hcl) .... As Needed For Nausea 11)  Loestrin 24 Fe 1-20 Mg-Mcg Tabs (Norethin Ace-Eth Estrad-Fe) .... Take 1 By Mouth Once Daily  Allergies (verified): No Known Drug Allergies  Past History:  Past Medical History:  1. Polycystic ovarian syndrome.   2. Hyperlipidemia.   3. Chronic back pain/neck pain  4. GERD   5. 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   6. PVCs  7. Hypertension  MD roster: cards - hochrien nsurg - nudleman gyn -  buist ([place])  Review of Systems  The patient denies weight loss, peripheral edema, and depression.    Physical Exam  General:  overweight-appearing.  alert, well-developed, well-nourished, and cooperative to examination.   spouse at side Lungs:  normal respiratory effort, no intercostal retractions or use of accessory muscles; normal breath sounds bilaterally - no crackles and no wheezes.    Heart:  normal rate, regular rhythm, no murmur, and no rub. BLE without edema.  Psych:  Oriented X3, memory intact for recent and remote, normally interactive, good eye contact, not anxious appearing, not depressed appearing, and not agitated.      Impression & Recommendations:  Problem # 1:  PALPITATIONS (ICD-785.1)  POTS tx per cards as ongoing still with symptoms despite ongoing low dose Bbloc - HR normal today and HD stable may need f/u cards sooner than later - see next  Her updated medication list for this problem includes:    Atenolol 25 Mg Tabs (Atenolol) .Marland Kitchen... 1 by mouth daily  Problem # 2:  SYNCOPE AND COLLAPSE (ICD-780.2) no further events but cont nearsync symptoms and dizziness can control symptoms with behavior modification (sitting before standing, etc) as above, continue low dose of beta blocker to treat same   POTS - per cards - ?full disability to be persued - defer this to cardiology  if cardiology unable to complete, will refer to "disability doctor" to assist as i do not feel comfortable with full/long term disablity advised her lawyer may be able to asst her with this as needed   Problem # 3:  ANXIETY (ICD-300.00) seems stable and improved to my eval - cont same Her updated medication list for this problem includes:    Paxil 20 Mg Tabs (Paroxetine hcl) .Marland Kitchen... 1 by mouth once daily  Problem # 4:  BACK PAIN, CHRONIC (ICD-724.5)  Her updated medication list for this problem includes:    Robaxin-750 750 Mg Tabs (Methocarbamol) .Marland Kitchen... 1 by mouth three times a day as  needed    Diclofenac Sodium 75 Mg Tbec (Diclofenac sodium) .Marland Kitchen... 1 by mouth two times a day as needed  prior neck and back surg reviewed - cont nsaids - f/u nudelman (nsurg) as needed  Problem # 5:  POLYCYSTIC OVARIAN DISEASE (ICD-256.4)  cont BCP as per gyn and metformin as ongoing      after review of requested information from [employer] re: short term disability, i do not feel able to answer the required information -  as above, defer to cards  or another "disability specialist"  Complete Medication List: 1)  Dexilant 60 Mg Cpdr (Dexlansoprazole) .... One by mouth daily 2)  Robaxin-750 750 Mg Tabs (Methocarbamol) .Marland Kitchen.. 1 by mouth three times a day as needed 3)  Diclofenac Sodium 75 Mg Tbec (Diclofenac sodium) .Marland Kitchen.. 1 by mouth two times a day as needed 4)  Calcium-vitamin D 600-200 Mg-unit Tabs (Calcium-vitamin d) .Marland Kitchen.. 1 by mouth two times a day 5)  Atenolol 25 Mg Tabs (Atenolol) .Marland Kitchen.. 1 by mouth daily 6)  Calcitonin (salmon) 200 Unit/act Soln (Calcitonin (salmon)) .Marland Kitchen.. 1 spray each nostril once daily 7)  Zyrtec Allergy 10 Mg Tabs (Cetirizine hcl) .Marland Kitchen.. 1 by mouth once daily 8)  Paxil 20 Mg Tabs (Paroxetine hcl) .Marland Kitchen.. 1 by mouth once daily 9)  Metformin Hcl 500 Mg Xr24h-tab (Metformin hcl) .Marland Kitchen.. 1 by mouth two times a day 10)  Promethazine Hcl 12.5 Mg Tabs (Promethazine hcl) .... As needed for nausea 11)  Loestrin 24 Fe 1-20 Mg-mcg Tabs (Norethin ace-eth estrad-fe) .... Take 1 by mouth once daily  Patient Instructions: 1)  it was good to see you today. interval history reviewed - 2)  no medication changes today - refills done as discussed 3)  will look into the process of your diability application - i do not complete forms for permanant disability, only short term; if dr. Antoine Poche is uanle to help with this process, will work to arrange for evaluation and treatment by a "disability doctor" - your lawyer may be able to help with this too 4)  Please schedule a follow-up appointment in 6  months, sooner if problems.  Prescriptions: PAXIL 20 MG TABS (PAROXETINE HCL) 1 by mouth once daily  #30 Each x 6   Entered and Authorized by:   Newt Lukes MD   Signed by:   Newt Lukes MD on 12/06/2009   Method used:   Electronically to        [facility] (retail)       [address]       [place], [place]  [address]       Ph: [phone]       Fax: [phone]   RxID:   [id number] PROMETHAZINE HCL 12.5 MG TABS (PROMETHAZINE HCL) as needed for nausea  #30 x 6   Entered and Authorized by:   Newt Lukes MD   Signed by:   Newt Lukes MD on 12/06/2009   Method used:   Electronically to        [facility] (retail)       [address]       [place], [place]  [address]       Ph: [phone]       Fax: [phone]   RxID:   [id number] METFORMIN HCL 500 MG XR24H-TAB (METFORMIN HCL) 1 by mouth two times a day  #60 x 6   Entered and Authorized by:   Newt Lukes MD   Signed by:   Newt Lukes MD on 12/06/2009   Method used:   Electronically to        [facility] (retail)       [address]       [place], [place]  [address]       Ph: [phone]  Fax: [phone]   RxID:   [id number]

## 2010-04-24 NOTE — Progress Notes (Signed)
  Phone Note Other Incoming   Caller: Pt Summary of Call: PT called and wants a letter before she goes on a cruise to states that due to her medical hx she cannot sit or stand for long periods of time. 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 Initial call taken by: Ami Bullins CMA,  November 24, 2009 11:11 AM  Follow-up for Phone Call        if leaving on her cruise before Tuesday we can do a note today otherwise wait for Dr. Felicity Coyer Follow-up by: Jacques Navy MD,  November 24, 2009 2:11 PM  Additional Follow-up for Phone Call Additional follow up Details #1::        pt is leaving for cruise tomorrow asks if she can have note emailed to her as she lives an hour away and is unsure if she can pick up today  email address is [email or web address] Additional Follow-up by: Brenton Grills MA,  November 24, 2009 2:49 PM    Additional Follow-up for Phone Call Additional follow up Details #2::    called and left a message on answering machine: notice too short, cannot e-mail patient related information. ON a cruise she should be able to limit her activity to a level of comfort. Left regrets on not being able to oblige her. Follow-up by: Jacques Navy MD,  November 24, 2009 6:31 PM

## 2010-04-24 NOTE — Letter (Signed)
Summary: [facility]  [facility]   Imported By: Sherian Rein 09/13/2009 07:38:29  _____________________________________________________________________  External Attachment:    Type:   Image     Comment:   External Document

## 2010-04-24 NOTE — Letter (Signed)
Summary: Recall Office Visit  [facility]  [address]   [place], [place] [address]   Phone: [phone]  Fax: [phone]      December 29, 2009   [facility] Pedraza-BURNET[place] [address] [place], [place]  [address] 62/95/2841   Dear Ms. Kinderman-BURNETTE,   According to our records, it is time for you to schedule a follow-up office visit with [place].   At your convenience, please call [phone] to schedule an office visit. If you have any questions, concerns, or feel that this letter is in error, we would appreciate your call.   Sincerely,    Diana Eves  [facility] Ph: [phone]   Fax: [phone]

## 2010-04-24 NOTE — Progress Notes (Signed)
  Phone Note Other Incoming   Request: Send information Summary of Call: Request for records received from [employer]. Request forwarded to [employer].     

## 2010-04-26 NOTE — Progress Notes (Signed)
Summary: Rx req  Phone Note Call from Patient   Caller: Patient [phone] Summary of Call: Pt called stating she wants to continue on Lyrica 75 mg 1 tab once daily. Pt is requesting Rx to [facility] Initial call taken by: Margaret Pyle, CMA,  March 28, 2010 2:56 PM  Follow-up for Phone Call        ok - changed to once daily dose and will fax rx as requested - thanks Follow-up by: Newt Lukes MD,  March 28, 2010 4:51 PM  Additional Follow-up for Phone Call Additional follow up Details #1::        Rx faxed to pharmacy Additional Follow-up by: Margaret Pyle, CMA,  March 28, 2010 4:53 PM    New/Updated Medications: LYRICA 75 MG CAPS (PREGABALIN) 1 by mouth once daily Prescriptions: LYRICA 75 MG CAPS (PREGABALIN) 1 by mouth once daily  #30 x 3   Entered and Authorized by:   Newt Lukes MD   Signed by:   Newt Lukes MD on 03/28/2010   Method used:   Printed then faxed to ...       [facility] (retail)       [address]       [place], [place]  [address]       Ph: [phone]       Fax: [phone]   RxID:   [id number]

## 2010-04-26 NOTE — Progress Notes (Signed)
Summary: Rx change  Phone Note Call from Patient   Caller: Patient [phone] Summary of Call: Pt called stating that coupon for Lyrica is good for up to 75mg  and she was given Rx for 100mg . Pt is requesting Rx for 75mg  instead, please advise Initial call taken by: Margaret Pyle, CMA,  March 20, 2010 3:00 PM  Follow-up for Phone Call        Med change authorized by VAL, Rx phoned into pharmacy via pharmacist Molly Maduro. Pt informed Follow-up by: Margaret Pyle, CMA,  March 20, 2010 3:18 PM    New/Updated Medications: LYRICA 75 MG CAPS (PREGABALIN) 1 by mouth two times a day Prescriptions: LYRICA 75 MG CAPS (PREGABALIN) 1 by mouth two times a day  #14 x 0   Entered and Authorized by:   Margaret Pyle, CMA   Signed by:   Margaret Pyle, CMA on 03/20/2010   Method used:   Telephoned to ...       [facility] ([facility])       [address]       [place], [place]  [address]       Ph: [phone]       Fax: [phone]   RxID:   [id number]

## 2010-04-26 NOTE — Assessment & Plan Note (Signed)
Summary: ?URI AND UTI/LB   Vital Signs:  Patient profile:   30 year old female Height:      65 inches (165.10 cm) Weight:      204 pounds (92.73 kg) O2 Sat:      97 % on Room air Temp:     97.9 degrees F (36.61 degrees C) oral Pulse rate:   108 / minute BP sitting:   124 / 98  (left arm) Cuff size:   regular  Vitals Entered By: Orlan Leavens RMA (March 20, 2010 10:12 AM)  O2 Flow:  Room air CC: ? UTI & URI Is Patient Diabetic? No   Primary Care Provider:  Newt Lukes MD  CC:  ? UTI & URI.  History of Present Illness: c/o UTI symptoms - onset >7 days ago no hematuria or flanks/abd/pelvic pain, no n/v +small freq voiding, +dysuria improved sx with cytex but not resolved (recurrent symptoms after dc of med)  also hoarseness, cough and ST >7 days - +myalgias -- but chronic related to POTS  here for followup -  1) depression and anxiety  - continued overwhelming fatigue and excess sleep patterns -  tol generic paxil - no adv se, 100% compliance - not sure if she feels better  2) PCOS - was on fertility tx for same prio to syncope spells and dx of POTS now on BCP to help control bleeding also on metformin - no adv SE will cont to follow with gyn for same - prev a/w sig weight gain, now stable  3) POTS - on low dose atenolol as intol of lopressor and diltiazem- follows with cards annually for eval and tx of same -  recommended by cards to remain out of work - letter by cards to insurance reviewed cont orthosatic dizzy symptoms, only showers if someone in house to supervise - no further syncope events not driving ?try lyrica to help with myalgias  4) spine DDD - s/p cervical surg for same as well as lumbar laminectomy takes daily NSAIDs for control of pain  5) dyslipidemia - related to PCOS and weight gain never on meds for same dx  Current Medications (verified): 1)  Dexilant 60 Mg Cpdr (Dexlansoprazole) .... One By Mouth Daily 2)  Robaxin-750 750 Mg  Tabs (Methocarbamol) .Marland Kitchen.. 1 By Mouth Three Times A Day As Needed 3)  Diclofenac Sodium 75 Mg Tbec (Diclofenac Sodium) .Marland Kitchen.. 1 By Mouth Two Times A Day As Needed 4)  Calcium-Vitamin D 600-200 Mg-Unit Tabs (Calcium-Vitamin D) .Marland Kitchen.. 1 By Mouth Two Times A Day 5)  Atenolol 25 Mg Tabs (Atenolol) .Marland Kitchen.. 1 By Mouth Daily 6)  Calcitonin (Salmon) 200 Unit/act Soln (Calcitonin (Salmon)) .Marland Kitchen.. 1 Spray Each Nostril Once Daily 7)  Zyrtec Allergy 10 Mg Tabs (Cetirizine Hcl) .Marland Kitchen.. 1 By Mouth Once Daily 8)  Paxil 20 Mg Tabs (Paroxetine Hcl) .Marland Kitchen.. 1 By Mouth Once Daily 9)  Metformin Hcl 500 Mg Xr24h-Tab (Metformin Hcl) .Marland Kitchen.. 1 By Mouth Two Times A Day 10)  Promethazine Hcl 12.5 Mg Tabs (Promethazine Hcl) .... As Needed For Nausea 11)  Loestrin 24 Fe 1-20 Mg-Mcg Tabs (Norethin Ace-Eth Estrad-Fe) .... Take 1 By Mouth Once Daily  Allergies (verified): No Known Drug Allergies  Past History:  Past Medical History:  1. Polycystic ovarian syndrome  2. Hyperlipidemia.   3. Chronic back pain/neck pain  4. GERD   5. 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 PVCs  7. Hypertension  MD roster: cards - hochrien nsurg - nudleman  gyn - buist ([place])  Review of Systems  The patient denies weight loss, weight gain, chest pain, peripheral edema, and headaches.    Physical Exam  General:  overweight-appearing.  alert, well-developed, well-nourished, and cooperative to examination.   spouse and dtr at side Lungs:  normal respiratory effort, no intercostal retractions or use of accessory muscles; normal breath sounds bilaterally - no crackles and no wheezes.    Heart:  normal rate, regular rhythm, no murmur, and no rub. BLE without edema.  Abdomen:  soft, non-tender, normal bowel sounds, no distention; no masses and no appreciable hepatomegaly or splenomegaly.   Psych:  Oriented X3, memory intact for recent and remote, normally interactive, good eye contact, not anxious appearing, not depressed appearing, and not  agitated.      Impression & Recommendations:  Problem # 1:  UTI (ICD-599.0)  Her updated medication list for this problem includes:    Cipro 500 Mg Tabs (Ciprofloxacin hcl) .Marland Kitchen... 1 by mouth two times a day x 5days  Encouraged to push clear liquids, get enough rest, and take acetaminophen as needed. To be seen in 10 days if no improvement, sooner if worse.  Orders: T-Culture, Urine ([address]) UA Dipstick w/o Micro (manual) ([address])  Problem # 2:  DYSAUTONOMIA (ICD-742.8) POTS dx - follows annually with cards for same on disablity for same as per cards - see letter fall 2011 cont atenolol and paxil -  ok to try lyrica for assoc myalgias - rx for 7 day free trial provided - to call if refills needed - consider savella or cymbalta in place of paxil as needed   Problem # 3:  GERD (ICD-530.81)  Her updated medication list for this problem includes:    Dexilant 60 Mg Cpdr (Dexlansoprazole) ..... One by mouth daily  Labs Reviewed: Hgb: 11.7 (12/05/2008)   Hct: 32.7 (12/05/2008)  Problem # 4:  SYNCOPE AND COLLAPSE (ICD-780.2)  no further events but cont near sync symptoms and dizziness can control symptoms with behavior modification (sitting before standing, etc) as above, continue low dose of beta blocker to treat same   related to POTS - per cards  Complete Medication List: 1)  Dexilant 60 Mg Cpdr (Dexlansoprazole) .... One by mouth daily 2)  Robaxin-750 750 Mg Tabs (Methocarbamol) .Marland Kitchen.. 1 by mouth three times a day as needed 3)  Diclofenac Sodium 75 Mg Tbec (Diclofenac sodium) .Marland Kitchen.. 1 by mouth two times a day as needed 4)  Calcium-vitamin D 600-200 Mg-unit Tabs (Calcium-vitamin d) .Marland Kitchen.. 1 by mouth two times a day 5)  Atenolol 25 Mg Tabs (Atenolol) .Marland Kitchen.. 1 by mouth daily 6)  Calcitonin (salmon) 200 Unit/act Soln (Calcitonin (salmon)) .Marland Kitchen.. 1 spray each nostril once daily 7)  Zyrtec Allergy 10 Mg Tabs (Cetirizine hcl) .Marland Kitchen.. 1 by mouth once daily 8)  Paxil 20 Mg Tabs (Paroxetine hcl)  .Marland Kitchen.. 1 by mouth once daily 9)  Metformin Hcl 500 Mg Xr24h-tab (Metformin hcl) .Marland Kitchen.. 1 by mouth two times a day 10)  Promethazine Hcl 12.5 Mg Tabs (Promethazine hcl) .... As needed for nausea 11)  Loestrin 24 Fe 1-20 Mg-mcg Tabs (Norethin ace-eth estrad-fe) .... Take 1 by mouth once daily 12)  Cipro 500 Mg Tabs (Ciprofloxacin hcl) .Marland Kitchen.. 1 by mouth two times a day x 5days 13)  Lyrica 100 Mg Caps (Pregabalin) .Marland Kitchen.. 1 by mouth two times a day  Patient Instructions: 1)  it was good to see you today. 2)  Cipro for bladder symptoms - your prescription has been electronically  submitted to your pharmacy. Please take as directed. Contact our office if you believe you're having problems with the medication(s).  will call if culture shows different bacteria or needs different antibiotic 3)  try lyrica for body aches and pains - your prescriptions has been printed and signed for you to submit to your pharmacy (along with free 7 days trial card). Please take as directed. Contact our office if you believe you're having problems with the medication(s).  If this medication helps with your pain symptoms, call for refills as needed  4)  refill on reflux medication as requested 5)  Please schedule a follow-up appointment in 6 months, call sooner if problems.  Prescriptions: DEXILANT 60 MG CPDR (DEXLANSOPRAZOLE) one by mouth daily  #30 x 6   Entered and Authorized by:   Newt Lukes MD   Signed by:   Newt Lukes MD on 03/20/2010   Method used:   Electronically to        [facility] (retail)       [address]       [place], [place]  [address]       Ph: [phone]       Fax: [phone]   RxID:   [id number] LYRICA 100 MG CAPS (PREGABALIN) 1 by mouth two times a day  #14 x 0   Entered and Authorized by:   Newt Lukes MD   Signed by:   Newt Lukes MD on 03/20/2010   Method used:   Print then Give to Patient   RxID:   [id number] CIPRO 500 MG TABS  (CIPROFLOXACIN HCL) 1 by mouth two times a day x 5days  #10 x 0   Entered and Authorized by:   Newt Lukes MD   Signed by:   Newt Lukes MD on 03/20/2010   Method used:   Electronically to        [facility] (retail)       [address]       [place], [place]  [address]       Ph: [phone]       Fax: [phone]   RxID:   [id number]    Orders Added: 1)  Est. Patient Level IV [[address]] 2)  T-Culture, Urine [[address]] 3)  UA Dipstick w/o Micro (manual) [[address]]    Laboratory Results   Urine Tests    Routine Urinalysis   Color: lt. yellow Appearance: Clear Glucose: negative   (Normal Range: Negative) Bilirubin: negative   (Normal Range: Negative) Ketone: negative   (Normal Range: Negative) Spec. Gravity: <1.005   (Normal Range: 1.003-1.035) Blood: negative   (Normal Range: Negative) pH: 5.0   (Normal Range: 5.0-8.0) Protein: negative   (Normal Range: Negative) Urobilinogen: 0.2   (Normal Range: 0-1) Nitrite: negative   (Normal Range: Negative) Leukocyte Esterace: small   (Normal Range: Negative)

## 2010-04-27 ENCOUNTER — Telehealth: Payer: Self-pay | Admitting: Internal Medicine

## 2010-05-02 NOTE — Progress Notes (Signed)
Summary: Lyrica  Phone Note Call from Patient   Caller: Patient [phone] Summary of Call: Pt called requesting Lyrica be increased to 100mg  two times a day. Pt states that VAL told her to call if medication worked for her after being given original 100mg  two times a day #14 x 0 and it would be refilled as the same, however Rx needed to be changed in order to use discount card. Pt has used card and is requesting Rx be changed back to 100mg  two times a day although she called 03/28/2010 requesting 75mg  once daily. I tried to advise pt that VAL may not change Rx and pt became angry stating that she is a [employer] fund[employer] and medication is not indicated to stop suddenly, I reminded pt that she recieved requested refills in January. Pt was very upset and requested to speak with VAL directly, I told pt that VAL is not in office this am but I will forward request to her. Pt again told me that she was and RN is is very aware of the appropriate dosing  and indications of medicine. Pt is requesting increase dose, frequency and a call from VAL, please advise Initial call taken by: Margaret Pyle, CMA,  April 27, 2010 9:43 AM  Follow-up for Phone Call        ok to call in lyrica 100mg  two times a day as this was the original rx - #60, 3 refills. - let pt know same - thanks Follow-up by: Newt Lukes MD,  April 27, 2010 12:33 PM  Additional Follow-up for Phone Call Additional follow up Details #1::        Notified pt md ok rx. Faxed to to [facility] @ [phone] Additional Follow-up by: Orlan Leavens RMA,  April 27, 2010 4:18 PM    New/Updated Medications: LYRICA 100 MG CAPS (PREGABALIN) 1 by mouth two times a day Prescriptions: LYRICA 100 MG CAPS (PREGABALIN) 1 by mouth two times a day  #60 x 3   Entered by:   Orlan Leavens RMA   Authorized by:   Newt Lukes MD   Signed by:   Orlan Leavens RMA on 04/27/2010   Method used:   Printed then faxed to ...       [facility] (retail)     [address]       [place], [place]  [address]       Ph: [phone]       Fax: [phone]   RxID:   [id number] LYRICA 100 MG CAPS (PREGABALIN) 1 by mouth two times a day  #60 x 3   Entered and Authorized by:   Newt Lukes MD   Signed by:   Newt Lukes MD on 04/27/2010   Method used:   Historical   RxID:   [id number]

## 2010-06-06 ENCOUNTER — Other Ambulatory Visit: Payer: Self-pay | Admitting: Internal Medicine

## 2010-06-06 ENCOUNTER — Ambulatory Visit (INDEPENDENT_AMBULATORY_CARE_PROVIDER_SITE_OTHER): Payer: Self-pay | Admitting: Internal Medicine

## 2010-06-06 ENCOUNTER — Other Ambulatory Visit: Payer: Self-pay

## 2010-06-06 ENCOUNTER — Encounter: Payer: Self-pay | Admitting: Internal Medicine

## 2010-06-06 DIAGNOSIS — Q078 Other specified congenital malformations of nervous system: Secondary | ICD-10-CM

## 2010-06-06 DIAGNOSIS — I10 Essential (primary) hypertension: Secondary | ICD-10-CM

## 2010-06-06 DIAGNOSIS — F411 Generalized anxiety disorder: Secondary | ICD-10-CM

## 2010-06-06 LAB — CBC WITH DIFFERENTIAL/PLATELET
Basophils Absolute: 0 10*3/uL (ref 0.0–0.1)
Basophils Relative: 0.6 % (ref 0.0–3.0)
Eosinophils Absolute: 0.1 10*3/uL (ref 0.0–0.7)
Hemoglobin: 12 g/dL (ref 12.0–15.0)
Lymphocytes Relative: 41.7 % (ref 12.0–46.0)
MCHC: 34.8 g/dL (ref 30.0–36.0)
MCV: 81.8 fl (ref 78.0–100.0)
Monocytes Absolute: 0.4 10*3/uL (ref 0.1–1.0)
Neutro Abs: 4.1 10*3/uL (ref 1.4–7.7)
Neutrophils Relative %: 51.2 % (ref 43.0–77.0)
RBC: 4.2 Mil/uL (ref 3.87–5.11)
RDW: 13.3 % (ref 11.5–14.6)

## 2010-06-06 LAB — BASIC METABOLIC PANEL
CO2: 28 mEq/L (ref 19–32)
Calcium: 9 mg/dL (ref 8.4–10.5)
Chloride: 102 mEq/L (ref 96–112)
Creatinine, Ser: 0.8 mg/dL (ref 0.4–1.2)
Glucose, Bld: 76 mg/dL (ref 70–99)

## 2010-06-10 ENCOUNTER — Emergency Department (HOSPITAL_COMMUNITY): Payer: Self-pay

## 2010-06-10 ENCOUNTER — Emergency Department (HOSPITAL_COMMUNITY)
Admission: EM | Admit: 2010-06-10 | Discharge: 2010-06-10 | Disposition: A | Discharge status: Home / Self Care | Payer: Self-pay | Attending: Emergency Medicine | Admitting: Emergency Medicine

## 2010-06-10 DIAGNOSIS — R000 Tachycardia, unspecified: Secondary | ICD-10-CM | POA: Insufficient documentation

## 2010-06-10 DIAGNOSIS — M25669 Stiffness of unspecified knee, not elsewhere classified: Secondary | ICD-10-CM | POA: Insufficient documentation

## 2010-06-10 DIAGNOSIS — Z79899 Other long term (current) drug therapy: Secondary | ICD-10-CM | POA: Insufficient documentation

## 2010-06-10 DIAGNOSIS — IMO0002 Reserved for concepts with insufficient information to code with codable children: Secondary | ICD-10-CM | POA: Insufficient documentation

## 2010-06-10 DIAGNOSIS — X500XXA Overexertion from strenuous movement or load, initial encounter: Secondary | ICD-10-CM | POA: Insufficient documentation

## 2010-06-10 DIAGNOSIS — M25569 Pain in unspecified knee: Secondary | ICD-10-CM | POA: Insufficient documentation

## 2010-06-10 DIAGNOSIS — M25469 Effusion, unspecified knee: Secondary | ICD-10-CM | POA: Insufficient documentation

## 2010-06-10 DIAGNOSIS — Y929 Unspecified place or not applicable: Secondary | ICD-10-CM | POA: Insufficient documentation

## 2010-06-12 NOTE — Assessment & Plan Note (Signed)
Summary: 6 MO ROV//# CD   Vital Signs:  Patient profile:   30 year old female Weight:      207.6 pounds (94.36 kg) BMI:     34.67 O2 Sat:      97 % on Room air Temp:     98.4 degrees F (36.89 degrees C) oral Pulse rate:   87 / minute BP sitting:   110 / 72  (left arm) Cuff size:   large  Vitals Entered By: Orlan Leavens RMA (June 06, 2010 3:45 PM)  O2 Flow:  Room air CC: 6 month follow-up Is Patient Diabetic? No Pain Assessment Patient in pain? no        Primary Care Provider:  Newt Lukes MD  CC:  6 month follow-up.  History of Present Illness: here for inc depression and anxiety - exac dizziness and pots symptoms   1) depression and anxiety  - continued overwhelming fatigue and excess sleep patterns -  tol generic paxil - no adv se, 100% compliance - worse with new stressors (mom living with pt/family)  2) PCOS - was on fertility tx for same prior to syncope spells and dx of POTS now on BCP to help control bleeding also on metformin - no adv SE will cont to follow with gyn for same - prev a/w sig weight gain, now stable  3) POTS - on low dose atenolol as intol of lopressor and diltiazem- follows with cards annually for eval and tx of same -  recommended by cards to remain out of work - letter by cards to insurance reviewed cont orthosatic dizzy symptoms, only showers if someone in house to supervise - no further syncope events not driving  4) spine DDD - s/p cervical surg for same as well as lumbar laminectomy takes daily NSAIDs and lyrica for control of pain + muscle relaxants  5) dyslipidemia - related to PCOS and weight gain never on meds for same dx  Clinical Review Panels:  Lipid Management   Cholesterol:  232 (06/06/2008)   LDL (bad choesterol):  128 (06/06/2008)   HDL (good cholesterol):  28 (06/06/2008)   Triglycerides:  378 (06/06/2008)  CBC   WBC:  8.2 (12/05/2008)   RBC:  3.88 (12/05/2008)   Hgb:  11.7 (12/05/2008)   Hct:  32.7  (12/05/2008)   Platelets:  311 (12/05/2008)   MCV  84.3 (12/05/2008)   RDW  13.2 (12/05/2008)   PMN:  50.4 (12/05/2008)   Monos:  4.6 (12/05/2008)   Eosinophils:  1.1 (12/05/2008)   Basophil:  0.1 (12/05/2008)  Complete Metabolic Panel   Glucose:  73 (06/06/2008)   Sodium:  136 (06/06/2008)   Potassium:  4.1 (06/06/2008)   Chloride:  101 (06/06/2008)   CO2:  19 (06/06/2008)   BUN:  14 (06/06/2008)   Creatinine:  0.78 (06/06/2008)   Albumin:  4.5 (06/06/2008)   Total Protein:  7.5 (06/06/2008)   Calcium:  9.2 (06/06/2008)   Total Bili:  0.3 (06/06/2008)   Alk Phos:  66 (06/06/2008)   SGPT (ALT):  27 (06/06/2008)   SGOT (AST):  21 (06/06/2008)   Current Medications (verified): 1)  Dexilant 60 Mg Cpdr (Dexlansoprazole) .... One By Mouth Daily 2)  Robaxin-750 750 Mg Tabs (Methocarbamol) .Marland Kitchen.. 1 By Mouth Three Times A Day As Needed 3)  Diclofenac Sodium 75 Mg Tbec (Diclofenac Sodium) .Marland Kitchen.. 1 By Mouth Two Times A Day As Needed 4)  Calcium-Vitamin D 600-200 Mg-Unit Tabs (Calcium-Vitamin D) .Marland Kitchen.. 1 By Mouth Two  Times A Day 5)  Atenolol 25 Mg Tabs (Atenolol) .Marland Kitchen.. 1 By Mouth Daily 6)  Calcitonin (Salmon) 200 Unit/act Soln (Calcitonin (Salmon)) .Marland Kitchen.. 1 Spray Each Nostril Once Daily 7)  Zyrtec Allergy 10 Mg Tabs (Cetirizine Hcl) .Marland Kitchen.. 1 By Mouth Once Daily 8)  Paxil 20 Mg Tabs (Paroxetine Hcl) .Marland Kitchen.. 1 By Mouth Once Daily 9)  Metformin Hcl 500 Mg Xr24h-Tab (Metformin Hcl) .Marland Kitchen.. 1 By Mouth Two Times A Day 10)  Promethazine Hcl 12.5 Mg Tabs (Promethazine Hcl) .... As Needed For Nausea 11)  Loestrin 24 Fe 1-20 Mg-Mcg Tabs (Norethin Ace-Eth Estrad-Fe) .... Take 1 By Mouth Once Daily 12)  Lyrica 100 Mg Caps (Pregabalin) .Marland Kitchen.. 1 By Mouth Two Times A Day  Allergies (verified): No Known Drug Allergies  Past History:  Past Medical History:  1. Polycystic ovarian syndrome  2. Hyperlipidemia   3. Chronic back pain/neck pain  4. GERD   5. 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 PVCs  7.  Hypertension  8. dysautonimia/POTS  MD roster: cards - hochrien nsurg - nudleman gyn - buist ([place])  Review of Systems       The patient complains of peripheral edema.  The patient denies weight loss, syncope, headaches, and abdominal pain.    Physical Exam  General:  overweight-appearing.  alert, well-developed, well-nourished, and cooperative to examination.   mom at side Lungs:  normal respiratory effort, no intercostal retractions or use of accessory muscles; normal breath sounds bilaterally - no crackles and no wheezes.    Heart:  normal rate, regular rhythm, no murmur, and no rub. BLE without edema.  Psych:  Oriented X3, memory intact for recent and remote, normally interactive, good eye contact, not anxious appearing, mild depressed appearing, and not agitated.      Impression & Recommendations:  Problem # 1:  DYSAUTONOMIA (ICD-742.8)  Orders: TLB-BMP (Basic Metabolic Panel-BMET) (80048-METABOL) TLB-CBC Platelet - w/Differential (85025-CBCD) TLB-TSH (Thyroid Stimulating Hormone) (84443-TSH)  POTS dx - follows annually with cards for same on disablity for same as per cards - see letter fall 2011 cont atenolol and paxil -  cont lyrica for assoc myalgias -  consider savella or cymbalta in place of paxil as needed (depends also on cost)  Problem # 2:  ANXIETY (ICD-300.00) inc dose paxil now - erx done Her updated medication list for this problem includes:    Paxil 40 Mg Tabs (Paroxetine hcl) .Marland Kitchen... 1 by mouth once daily  Orders: TLB-BMP (Basic Metabolic Panel-BMET) (80048-METABOL) TLB-CBC Platelet - w/Differential (85025-CBCD) TLB-TSH (Thyroid Stimulating Hormone) (09811-BJY) Prescription Created Electronically [phone])  Problem # 3:  HYPERTENSION (ICD-401.9)  Her updated medication list for this problem includes:    Atenolol 25 Mg Tabs (Atenolol) .Marland Kitchen... 1 by mouth daily  Orders: TLB-BMP (Basic Metabolic Panel-BMET) (80048-METABOL) TLB-CBC Platelet -  w/Differential (85025-CBCD) TLB-TSH (Thyroid Stimulating Hormone) (84443-TSH)  Currently her blood pressure is running low and this will be managed in the context of treating the above.  BP today: 110/72 Prior BP: 124/98 (03/20/2010)  Prior 10 Yr Risk Heart Disease: Not enough information (09/01/2008)  Labs Reviewed: K+: 4.1 (06/06/2008) Creat: : 0.78 (06/06/2008)   Chol: 232 (06/06/2008)   HDL: 28 (06/06/2008)   LDL: 128 (06/06/2008)   TG: 378 (06/06/2008)  Complete Medication List: 1)  Dexilant 60 Mg Cpdr (Dexlansoprazole) .... One by mouth daily 2)  Robaxin-750 750 Mg Tabs (Methocarbamol) .Marland Kitchen.. 1 by mouth three times a day as needed 3)  Diclofenac Sodium 75 Mg Tbec (Diclofenac sodium) .Marland KitchenMarland KitchenMarland Kitchen 1  by mouth two times a day as needed 4)  Calcium-vitamin D 600-200 Mg-unit Tabs (Calcium-vitamin d) .Marland Kitchen.. 1 by mouth two times a day 5)  Atenolol 25 Mg Tabs (Atenolol) .Marland Kitchen.. 1 by mouth daily 6)  Calcitonin (salmon) 200 Unit/act Soln (Calcitonin (salmon)) .Marland Kitchen.. 1 spray each nostril once daily 7)  Zyrtec Allergy 10 Mg Tabs (Cetirizine hcl) .Marland Kitchen.. 1 by mouth once daily 8)  Paxil 40 Mg Tabs (Paroxetine hcl) .Marland Kitchen.. 1 by mouth once daily 9)  Metformin Hcl 500 Mg Xr24h-tab (Metformin hcl) .Marland Kitchen.. 1 by mouth two times a day 10)  Promethazine Hcl 12.5 Mg Tabs (Promethazine hcl) .... As needed for nausea 11)  Loestrin 24 Fe 1-20 Mg-mcg Tabs (Norethin ace-eth estrad-fe) .... Take 1 by mouth once daily 12)  Lyrica 100 Mg Caps (Pregabalin) .Marland Kitchen.. 1 by mouth two times a day  Patient Instructions: 1)  it was good to see you today. 2)  increase paxil dose - your prescription and refills have been electronically submitted to your pharmacy. Please take as directed. Contact our office if you believe you're having problems with the medication(s).  3)  look into counseling support as discussed - hang in there 4)  test(s) ordered today - your results will be called to you after review in 48-72 hours from the time of test  completion; call [phone] and enter your 9 digit MRN (listed above on this page, just below your name); if any changes need to be made or there are abnormal results, you will be contacted directly.  5)  Please schedule a follow-up appointment as planned (3-6 months), call sooner if problems.  Prescriptions: DICLOFENAC SODIUM 75 MG TBEC (DICLOFENAC SODIUM) 1 by mouth two times a day as needed  #60 x 1   Entered and Authorized by:   Newt Lukes MD   Signed by:   Newt Lukes MD on 06/06/2010   Method used:   Electronically to        [facility] (retail)       [address]       [place], [place]  [address]       Ph: [phone]       Fax: [phone]   RxID:   [id number] ROBAXIN-750 750 MG TABS (METHOCARBAMOL) 1 by mouth three times a day as needed  #90 x 1   Entered and Authorized by:   Newt Lukes MD   Signed by:   Newt Lukes MD on 06/06/2010   Method used:   Electronically to        [facility] (retail)       [address]       [place], [place]  [address]       Ph: [phone]       Fax: [phone]   RxID:   [id number] PAXIL 40 MG TABS (PAROXETINE HCL) 1 by mouth once daily  #30 x 6   Entered and Authorized by:   Newt Lukes MD   Signed by:   Newt Lukes MD on 06/06/2010   Method used:   Electronically to        [facility] (retail)       [address]       [place], [place]  [address]  Ph: [phone]       Fax: [phone]   RxID:   [id number]    Orders Added: 1)  TLB-BMP (Basic Metabolic Panel-BMET) [80048-METABOL] 2)  TLB-CBC Platelet - w/Differential [85025-CBCD] 3)  TLB-TSH (Thyroid Stimulating Hormone) [84443-TSH] 4)  Est. Patient Level IV [62952] 5)  Prescription Created Electronically [phone]

## 2010-06-14 ENCOUNTER — Encounter: Payer: Self-pay | Admitting: Internal Medicine

## 2010-06-14 ENCOUNTER — Ambulatory Visit (INDEPENDENT_AMBULATORY_CARE_PROVIDER_SITE_OTHER): Payer: Self-pay | Admitting: Internal Medicine

## 2010-06-14 VITALS — BP 102/72 | HR 90 | Temp 98.6°F | Ht 65.0 in | Wt 207.0 lb

## 2010-06-14 DIAGNOSIS — S83209A Unspecified tear of unspecified meniscus, current injury, unspecified knee, initial encounter: Secondary | ICD-10-CM

## 2010-06-14 DIAGNOSIS — IMO0002 Reserved for concepts with insufficient information to code with codable children: Secondary | ICD-10-CM

## 2010-06-14 HISTORY — DX: Unspecified tear of unspecified meniscus, current injury, unspecified knee, initial encounter: S83.209A

## 2010-06-14 MED ORDER — OXYCODONE-ACETAMINOPHEN 5-325 MG PO TABS: 1.0000 | ORAL_TABLET | Freq: Four times a day (QID) | ORAL | Status: DC | PRN

## 2010-06-14 NOTE — Patient Instructions (Signed)
Good to see you today. we'll make referral to orthopedics as discussed. Our office will contact you regarding appointment(s) once made. Until then, continue knee brace and crutches support, ice and elevate leg. Refill on percocet done today pending ortho evaluation. Keep followup as previously scheduled, call sooner if problems

## 2010-06-14 NOTE — Progress Notes (Signed)
Subjective:    Patient ID: Kayla Floyd, female    DOB: 09/12/1980, 30 y.o.   MRN: [id number]  HPI Here for followup ER -  seen 06/10/10 at [facility] ER for right knee pain. Injury and pain precipitated by descending stairs - no twisting - no fall Felt pop on lateral side of right knee with swelling and pain medially and superior to knee Pain worse with flexion, no locking but feels unstable Workup included xray - no fx rx'd percocet for pain control, requests refill now Wearing knee immobilizer, swelling improved but not resolved  Also reviewed chronic med issues:  1) depression and anxiety  - continued overwhelming fatigue and excess sleep patterns -  tol generic paxil - no adv se, 100% compliance - worse with new stressors (mom living with pt/family)  2) PCOS - was on fertility tx for same prior to syncope spells and dx of POTS now on BCP to help control bleeding also on metformin - no adv SE will cont to follow with gyn for same - prev a/w sig weight gain, now stable  3) POTS - on low dose atenolol as intol of lopressor and diltiazem- follows with cards annually for eval and tx of same -  recommended by cards to remain out of work - letter by cards to insurance reviewed cont orthosatic dizzy symptoms, only showers if someone in house to supervise - no further syncope events not driving  4) spine DDD - s/p cervical surg for same as well as lumbar laminectomy takes daily NSAIDs and lyrica for control of pain + muscle relaxants  5) dyslipidemia - related to PCOS and weight gain never on meds for same dx  Past Medical History  Diagnosis Date  . POLYCYSTIC OVARIAN DISEASE 08/31/2008  . HYPERLIPIDEMIA 08/31/2008  . MIGRAINE HEADACHE 07/28/2009  . HYPERTENSION 07/28/2009  . GERD 08/31/2008  . BACK PAIN, CHRONIC 08/31/2008  . Palpitations 07/10/2009  . UTI 03/20/2010  . DYSAUTONOMIA 03/20/2010   Current outpatient prescriptions:atenolol (TENORMIN) 25 MG tablet, Take 25 mg  by mouth daily.  , Disp: , Rfl: ;  calcitonin, salmon, (MIACALCIN/FORTICAL) 200 UNIT/ACT nasal spray, 1 spray by Nasal route daily.  , Disp: , Rfl: ;  Calcium 600-200 MG-UNIT per tablet, Take 1 tablet by mouth 2 (two) times daily.  , Disp: , Rfl: ;  cetirizine (ZYRTEC) 10 MG tablet, Take 10 mg by mouth daily.  , Disp: , Rfl:  dexlansoprazole (DEXILANT) 60 MG capsule, Take 60 mg by mouth daily.  , Disp: , Rfl: ;  diclofenac (VOLTAREN) 75 MG EC tablet, Take 75 mg by mouth 2 (two) times daily.  , Disp: , Rfl: ;  metFORMIN (GLUCOPHAGE) 500 MG tablet, Take 500 mg by mouth 2 (two) times daily with a meal.  , Disp: , Rfl: ;  Norethin Ace-Eth Estrad-FE (LOESTRIN 24 FE) 1-20 MG-MCG(24) TABS, Take by mouth daily.  , Disp: , Rfl:  PARoxetine (PAXIL) 40 MG tablet, Take 40 mg by mouth every morning.  , Disp: , Rfl: ;  pregabalin (LYRICA) 100 MG capsule, Take 100 mg by mouth 2 (two) times daily.  , Disp: , Rfl: ;  promethazine (PHENERGAN) 12.5 MG tablet, Take 12.5 mg by mouth every 6 (six) hours as needed.  , Disp: , Rfl: ;  oxyCODONE-acetaminophen (PERCOCET) 5-325 MG per tablet, Take 1 tablet by mouth every 6 (six) hours as needed., Disp: 30 tablet, Rfl: 0 DISCONTD: oxyCODONE-acetaminophen (PERCOCET) 5-325 MG per tablet, Take 1 tablet by mouth every  6 (six) hours as needed.  , Disp: , Rfl:    Review of Systems  Constitutional: Negative for fever.  Respiratory: Negative for cough.   Cardiovascular: Negative for chest pain.  Musculoskeletal: Positive for back pain, joint swelling and gait problem.       Objective:   Physical Exam  Constitutional: She appears well-developed and well-nourished.       Mom at side  Cardiovascular: Normal rate, regular rhythm and normal heart sounds.   Pulmonary/Chest: Effort normal and breath sounds normal.  Musculoskeletal:       Right knee: She exhibits decreased range of motion, swelling, effusion and ecchymosis. She exhibits no deformity, no laceration, no erythema and normal  alignment. tenderness found. Medial joint line tenderness noted.       Left knee: Normal.   Positive patella apprehension on right knee          Assessment & Plan:  Right knee pain - suspect patella dislocation vs. acute meniscus tear - See problem list. Medications, xray and labs reviewed today.

## 2010-06-14 NOTE — Assessment & Plan Note (Addendum)
Right knee pain - suspect patella dislocation vs. acute meniscus tear Onset 06/09/10 descending stairs, no twisting or trauma -  Refer ortho, hold mri until after specialist evaluation, refill percocet done today (#30) pending ortho eval Continue knee immobilizer and crutch support

## 2010-06-18 ENCOUNTER — Other Ambulatory Visit (HOSPITAL_COMMUNITY): Payer: Self-pay | Admitting: Sports Medicine

## 2010-06-18 DIAGNOSIS — M25461 Effusion, right knee: Secondary | ICD-10-CM

## 2010-06-18 DIAGNOSIS — S83206A Unspecified tear of unspecified meniscus, current injury, right knee, initial encounter: Secondary | ICD-10-CM

## 2010-06-18 DIAGNOSIS — M25561 Pain in right knee: Secondary | ICD-10-CM

## 2010-06-22 ENCOUNTER — Ambulatory Visit (HOSPITAL_COMMUNITY)
Admission: RE | Admit: 2010-06-22 | Discharge: 2010-06-22 | Disposition: A | Discharge status: Home / Self Care | Payer: Self-pay | Source: Ambulatory Visit | Attending: Sports Medicine | Admitting: Sports Medicine

## 2010-06-22 DIAGNOSIS — S82109A Unspecified fracture of upper end of unspecified tibia, initial encounter for closed fracture: Secondary | ICD-10-CM | POA: Insufficient documentation

## 2010-06-22 DIAGNOSIS — X58XXXA Exposure to other specified factors, initial encounter: Secondary | ICD-10-CM | POA: Insufficient documentation

## 2010-06-22 DIAGNOSIS — S83206A Unspecified tear of unspecified meniscus, current injury, right knee, initial encounter: Secondary | ICD-10-CM

## 2010-06-22 DIAGNOSIS — M25469 Effusion, unspecified knee: Secondary | ICD-10-CM | POA: Insufficient documentation

## 2010-06-22 DIAGNOSIS — M25461 Effusion, right knee: Secondary | ICD-10-CM

## 2010-06-22 DIAGNOSIS — M25561 Pain in right knee: Secondary | ICD-10-CM

## 2010-06-27 LAB — CBC
HCT: 37 % (ref 36.0–46.0)
Hemoglobin: 13.1 g/dL (ref 12.0–15.0)
MCHC: 35.5 g/dL (ref 30.0–36.0)
MCV: 83 fL (ref 78.0–100.0)
Platelets: 292 10*3/uL (ref 150–400)
RBC: 4.47 MIL/uL (ref 3.87–5.11)
RDW: 11.9 % (ref 11.5–15.5)
WBC: 8.8 10*3/uL (ref 4.0–10.5)

## 2010-06-27 LAB — BASIC METABOLIC PANEL
BUN: 9 mg/dL (ref 6–23)
CO2: 27 mEq/L (ref 19–32)
Calcium: 10 mg/dL (ref 8.4–10.5)
Chloride: 100 mEq/L (ref 96–112)
Creatinine, Ser: 0.7 mg/dL (ref 0.4–1.2)
GFR calc Af Amer: >60 mL/min (ref >60)
GFR calc non Af Amer: >60 mL/min (ref >60)
Glucose, Bld: 83 mg/dL (ref 70–99)
Potassium: 4.5 mEq/L (ref 3.5–5.1)
Sodium: 135 mEq/L (ref 135–145)

## 2010-07-10 LAB — BASIC METABOLIC PANEL
BUN: 8 mg/dL (ref 6–23)
Chloride: 106 mEq/L (ref 96–112)
GFR calc non Af Amer: >60 mL/min (ref >60)
Glucose, Bld: 93 mg/dL (ref 70–99)
Potassium: 4.1 mEq/L (ref 3.5–5.1)
Sodium: 137 mEq/L (ref 135–145)

## 2010-07-10 LAB — CBC
HCT: 34.2 % — ABNORMAL LOW (ref 36.0–46.0)
Hemoglobin: 12.3 g/dL (ref 12.0–15.0)
MCV: 84.1 fL (ref 78.0–100.0)
Platelets: 282 10*3/uL (ref 150–400)
RDW: 12.1 % (ref 11.5–15.5)
WBC: 7.1 10*3/uL (ref 4.0–10.5)

## 2010-07-10 LAB — DIFFERENTIAL
Eosinophils Absolute: 0.1 10*3/uL (ref 0.0–0.7)
Eosinophils Relative: 1 % (ref 0–5)
Lymphocytes Relative: 33 % (ref 12–46)
Lymphs Abs: 2.3 10*3/uL (ref 0.7–4.0)
Monocytes Absolute: 0.4 10*3/uL (ref 0.1–1.0)

## 2010-07-10 LAB — POCT CARDIAC MARKERS
CKMB, poc: <1 ng/mL — ABNORMAL LOW (ref 1.0–8.0)
Troponin i, poc: <0.05 ng/mL (ref 0.00–0.09)

## 2010-07-23 ENCOUNTER — Telehealth: Payer: Self-pay | Admitting: Cardiology

## 2010-07-23 NOTE — Telephone Encounter (Signed)
appt given for May 3 , 2012 at 9:30am

## 2010-07-23 NOTE — Telephone Encounter (Signed)
Per pt call states she is having more PVC's than normal for the past 4 days.  She is having a squeezing in her heart that is more consistent and occurs with PVC's.  She reports taking Atenolol as ordered.  Pt is also concerned that her weight is up 3 to 4 lbs despite decreasing NA intake and only drinking water. (of note - pt does have POTS).   HR is in the 90's and last BP was 120/80's per pt report.  She would like to know from Dr Antoine Poche what to do.  Will review with him and call back with any orders.  Pt is agreeable.

## 2010-07-23 NOTE — Telephone Encounter (Signed)
Pt having chest pain for fours days now. Pt states its been getting worse.

## 2010-07-23 NOTE — Telephone Encounter (Signed)
I would suggest an appt with the PA this week or if I have a cancellation on Thursday.

## 2010-07-25 ENCOUNTER — Encounter: Payer: Self-pay | Admitting: Cardiology

## 2010-07-25 ENCOUNTER — Ambulatory Visit (INDEPENDENT_AMBULATORY_CARE_PROVIDER_SITE_OTHER): Payer: Self-pay | Admitting: Cardiology

## 2010-07-25 VITALS — BP 114/76 | HR 83 | Ht 65.0 in | Wt 206.0 lb

## 2010-07-25 DIAGNOSIS — I10 Essential (primary) hypertension: Secondary | ICD-10-CM

## 2010-07-25 DIAGNOSIS — R002 Palpitations: Secondary | ICD-10-CM

## 2010-07-25 DIAGNOSIS — S83209A Unspecified tear of unspecified meniscus, current injury, unspecified knee, initial encounter: Secondary | ICD-10-CM

## 2010-07-25 DIAGNOSIS — R55 Syncope and collapse: Secondary | ICD-10-CM

## 2010-07-25 NOTE — Assessment & Plan Note (Signed)
At this point I will start a 24-hour blood pressure monitor to try to understand how I might be able to adjust beta blockers or other therapies. She will continue to follow with her primary for management of other issues.

## 2010-07-25 NOTE — Progress Notes (Signed)
HPI The patient presents for evaulation of POTS.  After our last visit she was making some slight improvement. Following with her primary provider for management regimen. Unfortunately she had increased stress in her life and her mother moved in.  The last couple of weeks she's had increasing dizziness palpitations. He nauseated. She has been excessively fatigued and sleeping frequently. Her blood pressures have been well labile often low. She has had increasing orthostatic symptoms though she has had no frank syncope. His abdomen and chest pressure. There is chronic dyspnea but no PND or orthopnea. She had been making some slow and steady improvements tolerating the medications as listed.  No Known Allergies  Current Outpatient Prescriptions  Medication Sig Dispense Refill  . atenolol (TENORMIN) 25 MG tablet Take 25 mg by mouth daily.        . calcitonin, salmon, (MIACALCIN/FORTICAL) 200 UNIT/ACT nasal spray 1 spray by Nasal route daily.        . Calcium 600-200 MG-UNIT per tablet Take 1 tablet by mouth 2 (two) times daily.        . cetirizine (ZYRTEC) 10 MG tablet Take 10 mg by mouth daily.        Marland Kitchen dexlansoprazole (DEXILANT) 60 MG capsule Take 60 mg by mouth daily.        . diclofenac (VOLTAREN) 75 MG EC tablet Take 75 mg by mouth 2 (two) times daily.        Marland Kitchen HYDROcodone-acetaminophen (NORCO) 5-325 MG per tablet Take 1 tablet by mouth every 6 (six) hours as needed.        . metFORMIN (GLUCOPHAGE) 500 MG tablet Take 500 mg by mouth 2 (two) times daily with a meal.        . Norethin Ace-Eth Estrad-FE (LOESTRIN 24 FE) 1-20 MG-MCG(24) TABS Take by mouth daily.        Marland Kitchen oxyCODONE-acetaminophen (PERCOCET) 5-325 MG per tablet Take 1 tablet by mouth every 6 (six) hours as needed.  30 tablet  0  . PARoxetine (PAXIL) 40 MG tablet Take 40 mg by mouth every morning.        . pregabalin (LYRICA) 100 MG capsule Take 100 mg by mouth 2 (two) times daily.        . promethazine (PHENERGAN) 12.5 MG tablet Take  12.5 mg by mouth every 6 (six) hours as needed.          Past Medical History  Diagnosis Date  . POLYCYSTIC OVARIAN DISEASE 08/31/2008  . HYPERLIPIDEMIA 08/31/2008  . MIGRAINE HEADACHE 07/28/2009  . HYPERTENSION 07/28/2009  . GERD 08/31/2008  . BACK PAIN, CHRONIC 08/31/2008  . Palpitations 07/10/2009  . UTI 03/20/2010  . DYSAUTONOMIA 03/20/2010    Past Surgical History  Procedure Date  . Lumbar laminectomy 2008  . Cervical spine 2010    ACDF c5-6  . Tonsillectomy and adenoidectomy     ROS:  As stated in the HPI and negative for all other systems. She did fall injuring her right leg.  PHYSICAL EXAM BP 114/76  Pulse 83  Ht 5\' 5"  (1.651 m)  Wt 206 lb (93.441 kg)  BMI 34.28 kg/m2 GENERAL:  Well appearing HEENT:  Pupils equal round and reactive, fundi not visualized, oral mucosa unremarkable NECK:  No jugular venous distention, waveform within normal limits, carotid upstroke brisk and symmetric, no bruits, no thyromegaly LYMPHATICS:  No cervical, inguinal adenopathy LUNGS:  Clear to auscultation bilaterally BACK:  No CVA tenderness CHEST:  Unremarkable HEART:  PMI not displaced or sustained,S1 and  S2 within normal limits, no S3, no S4, no clicks, no rubs, no murmurs ABD:  Flat, positive bowel sounds normal in frequency in pitch, no bruits, no rebound, no guarding, no midline pulsatile mass, no hepatomegaly, no splenomegaly EXT:  2 plus pulses throughout, no edema, no cyanosis no clubbing SKIN:  No rashes no nodules NEURO:  Cranial nerves II through XII grossly intact, motor grossly intact throughout PSYCH:  Cognitively intact, oriented to person place and time   EKG:  Sinus rhythm, rate 83 axis within normal limits, intervals within normal soft tissue ventricular contractions  ASSESSMENT AND PLAN

## 2010-07-25 NOTE — Patient Instructions (Addendum)
You will scheduled to have a 24 hour blood pressure monitor placed.  You will be called to schedule this appointment.  If you do not hear from the scheduling department please call them at [phone]. Please continue your current medications Follow up as scheduled

## 2010-07-25 NOTE — Assessment & Plan Note (Signed)
She fell injuring her leg and right knee surgery. She did not contraindication in ACC/AHA guidelines although she would need careful monitoring of heart rate and blood pressure.

## 2010-07-26 ENCOUNTER — Ambulatory Visit: Payer: Self-pay | Admitting: Cardiology

## 2010-07-26 ENCOUNTER — Encounter (INDEPENDENT_AMBULATORY_CARE_PROVIDER_SITE_OTHER): Payer: Self-pay

## 2010-07-26 DIAGNOSIS — R030 Elevated blood-pressure reading, without diagnosis of hypertension: Secondary | ICD-10-CM

## 2010-07-26 DIAGNOSIS — R55 Syncope and collapse: Secondary | ICD-10-CM

## 2010-07-27 ENCOUNTER — Telehealth: Payer: Self-pay | Admitting: Cardiology

## 2010-07-27 NOTE — Telephone Encounter (Signed)
Pt has question re blood pressure monitor and documents that she needs by today.

## 2010-07-27 NOTE — Telephone Encounter (Signed)
Pt called and waiting on documents that she needs by end of today.  Pt has called earlier requesting same.

## 2010-07-30 NOTE — Telephone Encounter (Signed)
Lawyer Harland Dingwall- is aware that Elita Quick is in a meeting. Status of letter  Re long term disability.

## 2010-07-30 NOTE — Telephone Encounter (Signed)
I believe that the office policy is a two week turn around for disability forms.  Please confirm this and inform the patient.

## 2010-07-30 NOTE — Telephone Encounter (Signed)
Will forward to Dr Antoine Poche for his completion

## 2010-08-01 ENCOUNTER — Telehealth: Payer: Self-pay | Admitting: Cardiology

## 2010-08-01 NOTE — Telephone Encounter (Signed)
Kayla Floyd aware pt brought paperwork to [place] Wednesday of last week (her appt was at 4pm) and that our policy is up to 14 business days.   She will let the atty know.

## 2010-08-01 NOTE — Telephone Encounter (Signed)
Attorney daniel nash's office calling on pt's behalf to follow up on the status of her short term disability form, pt needs today, on a time crunch at this point-pls call

## 2010-08-05 ENCOUNTER — Encounter: Payer: Self-pay | Admitting: Cardiology

## 2010-08-06 NOTE — Telephone Encounter (Signed)
Letter completed and information given to Medical Records - Kayla Batten - to be faxed to atty

## 2010-08-07 NOTE — Op Note (Signed)
NAME:  Kayla Floyd, Kayla Floyd              ACCOUNT NO.:  [id number]   MEDICAL RECORD NO.:  [id number]          PATIENT TYPE:  AMB   LOCATION:  DAY                           FACILITY:  [facility]   PHYSICIAN:  R. Roetta Sessions, M.D. DATE OF BIRTH:  04/06/1980   DATE OF PROCEDURE:  02/05/2008  DATE OF DISCHARGE:                               OPERATIVE REPORT   PROCEDURE:  Diagnostic esophagogastroduodenoscopy.   INDICATIONS FOR PROCEDURE:  A 30 year old lady with a couple month  history of upper abdominal discomfort, nausea in the setting of multiple  NSAID use (although she does not use either Relafen nor ibuprofen at the  same time).  Recent battery of labs include CBC, amylase, lipase, LFTs,  and met-7, all look good except for slightly elevated ALT of 37.  Urine  pregnancy test negative.  CT of the abdomen and pelvis demonstrated a  fatty-appearing liver and prominent endometrial stripe for which GYN  evaluation was recommended.  EGD is now being done to further evaluate  her symptoms.  Risks, benefits, alternatives, and limitations have been  reviewed.  Questions answered.  Please see the documentation in the  medical record.   PROCEDURE NOTE:  O2 saturation, blood pressure, pulse, and respirations  were monitored throughout the entirety of the procedure.   CONSCIOUS SEDATION:  Versed 5 mg IV and Demerol 100 mg IV in divided  doses.   INSTRUMENT:  Pentax video chip system.  Cetacaine spray for topical  pharyngeal anesthesia.   FINDINGS:  Examination of the tubular esophagus revealed multiple 5 to  10-mm distal esophageal erosions coming up from the EG junction.  There  was no Barrett esophagus or other abnormality.  EG junction was easily  traversed.  Stomach:  Gastric cavity was emptied and insufflated well  with air.  Thorough examination of the gastric mucosa including the  retroflex view of the proximal stomach and esophagogastric junction  demonstrated no abnormalities aside from the  couple of antral erosions.  There was no ulcer or infiltrating process.  Pylorus was patent and  easily traversed.  Examination of the bulb and second portion revealed  multiple areas of erosion with surrounding erythema extending into the  proximal second portion of the duodenum.  There was no frank ulcer seen,  however.  Duodenal mucosa, otherwise, appeared unremarkable.  Therapeutic/diagnostic maneuvers performed:  None.   The patient tolerated the procedure well and was reactive to Endoscopy.   IMPRESSION:  1. Distal esophageal erosion consistent with erosive reflux      esophagitis.  2. Couple of tiny antral erosions; otherwise, normal-appearing gastric      mucosa, multiple bulbar D2 erosions consistent with erosive      duodenitis.   The patient has gastroesophageal reflux disease and may have likely a  NSAID insult to her stomach and duodenum as well.   RECOMMENDATIONS:  1. We would like her to stop taking Relafen and ibuprofen at least for      the next 3-4 weeks.  2. Check H. pylori serologies.  3. Begin Kapidex 60 mg orally once daily before breakfast.  She needs  to get to my office for free samples.  We will arrange for her to      come back to see [place] in the office in 1 month.  4. At some point in a month or two, we will have her repeat hepatic      profile.  5. Antireflux measures/lifestyle (including weight loss) will be      recommended.      Jonathon Bellows, M.D.  Electronically Signed     RMR/MEDQ  D:  02/05/2008  T:  02/06/2008  Job:  [id number]   cc:   Meredith Mody

## 2010-08-07 NOTE — Assessment & Plan Note (Signed)
[facility]                          [facility] OFFICE NOTE   NAME:Kayla Floyd, Kayla Floyd                     MRN:          [id number]  DATE:08/05/2008                            DOB:          [birth year]    PRIMARY CARE PHYSICIAN:  Meredith Mody, MD   REASON FOR PRESENTATION:  Evaluate the patient with frequent premature  ventricular contractions.   HISTORY OF PRESENT ILLNESS:  The patient is a pleasant 30 year old white  female with a history of palpitations.  She says that this has been  going on frequently for months.  She describes palpitations throughout  the day.  As a nurse, she describes frequent premature contractions,  occasional couplets.  She has completely cut out caffeine and it has not  really improved these.  She may notice it more if she is more fatigued,  but there is not a strong correlation.  They do not wake her from her  sleep.  She cannot bring them on.  She has had no presyncope or syncope.  She has left work in the past because a kind of made her feel drained.  She does not have any chest pressure, neck, or arm discomfort.  She has  no shortness of breath, PND, or orthopnea.   She did wear an event monitor for 21 days.  This demonstrated frequent  premature ventricular contractions and occasionally in a  bigeminal  pattern.  These were episodes that she triggered.  She had some  autotriggering with sinus tachycardia.  However, it is not clear what  she was doing at those moments.  She says she does have a resting heart  rate that goes up fairly frequently, but she is not describing sustained  paroxysms of sudden tachy palpitations.   I did review old labs done by her GYN and that included a TSH, which was  normal.  She had normal electrolytes as well.  Finally, I reviewed the  echocardiogram that she had done most recently and it demonstrated no  abnormalities.   PAST MEDICAL HISTORY:  1. Polycystic ovarian syndrome.  2.  Hyperlipidemia.  3. Chronic back pain.  4. Gastroesophageal reflux disease.  5. 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   PAST SURGICAL HISTORY:  Lumbar laminectomy.   ALLERGIES:  GLYCEROL.   MEDICATIONS:  Diclofenac 75 mg b.i.d., Robaxin, Kapidex 60 mg daily.   REVIEW OF SYSTEMS:  As stated in the HPI and otherwise negative for all  other systems.   PHYSICAL EXAMINATION:  GENERAL:  The patient is pleasant and in no  distress.  VITAL SIGNS:  Blood pressure 128/72, heart rate 79 and regular, and  weight 203 pounds.  HEENT:  Eyelids unremarkable.  Pupils equal, round, and reactive to  light.  Fundi not visualized.  Oral mucosa unremarkable.  NECK:  No jugular venous distension at 45 degrees.  Carotid upstroke  brisk and symmetric.  No bruits.  No thyromegaly.  LYMPHATICS:  No cervical, axillary, or inguinal adenopathy.  LUNGS:  Clear to auscultation bilaterally.  BACK:  No costovertebral angle tenderness.  CHEST:  Unremarkable.  HEART:  PMI not  displaced or sustained.  S1 and S2 within normal limits.  No S3, no S4.  No clicks, no rubs, no murmurs.  ABDOMEN:  Obese, positive bowel sounds, normal in frequency and pitch.  No bruits, no rebound, no guarding.  No midline pulsatile mass.  No  hepatomegaly, no splenomegaly.  SKIN:  No rashes, no nodules.  EXTREMITIES:  Pulses 2+ throughout.  No edema, no cyanosis, no clubbing.  NEUROLOGIC:  Oriented to person, place, and time.  Cranial nerves II  through XII grossly intact.  Motor grossly intact throughout.   ASSESSMENT AND PLAN:  1. Premature ventricular contractions.  The patient is having      symptomatic frequent ventricular ectopy.  We discussed this at      length.  She has a structurally normal heart by echo.  I am going      to put her on a treadmill to see if I can induce any arrhythmia,      such as an right ventricular outflow tract arrhythmia.  I do not      think that ablation, however, would be indicated unless she  has any      sustained tachyarrhythmias.  Rather, I am going to go ahead and      give her a low-dose beta-blocker.  This should be safe even though      she is going to infertility.  I am getting every 12.5 mg twice a      day of metoprolol.  We discussed fetal problems including      intrauterine growth retardation and bradyarrhythmias and she would      stop the drug if she finds out she is pregnant.  We will adjust the      dose as needed for to see if we can improve on these      symptomatically.  If I feel like I need to quantify further the      amount of arrhythmia or to look at the sinus tachycardia, which she      is having or are aware of 24-48 hour Holter monitor.  2. Hypertension.  Blood pressure is controlled.  She will continue the      meds as listed.  This apparently has not been a recent problem.      They should give [place] room, however, to titrate her beta-blocker.  3. Followup.  I will see her at the time of the treadmill.     Rollene Rotunda, MD, [facility]  Electronically Signed    JH/MedQ  DD: 08/05/2008  DT: 08/06/2008  Job #: [id number]   cc:   Meredith Mody, MD

## 2010-08-07 NOTE — Consult Note (Signed)
NAME:  Kayla Floyd, Kayla Floyd              ACCOUNT NO.:  [id number]   MEDICAL RECORD NO.:  [id number]          PATIENT TYPE:  AMB   LOCATION:  DAY                           FACILITY:  [facility]   PHYSICIAN:  Kayla Floyd, M.D. DATE OF BIRTH:  Apr 10, 1980   DATE OF CONSULTATION:  01/15/2008  DATE OF DISCHARGE:                                 CONSULTATION   REQUESTING PHYSICIAN:  Dr. Willaim Floyd from [place], [place].   PHYSICIAN CONSULTING NOTE:  Kayla Bellows, MD   REASON FOR CONSULTATION:  Abdominal pain for 2 months.   HISTORY OF PRESENT ILLNESS:  Kayla Floyd is 30 year old Caucasian  female.  She has history of chronic abdominal pain that has been present  for a couple of months now.  She rates the pain anywhere from 5-7/10 on  pain scale.  It is associated with nausea.  It is usually postprandial.  It comes on within 30 minutes or so after eating.  It is mostly to the  upper abdomen, but at times she feels bloating and cramps throughout her  entire abdomen.  This all started with an acute onset which was quite  severe and lasted 1 week.  She had nausea with the episode but never  vomited.  And now, she has episodes at least 2-3 times per week.  She  has not noticed any correlations with any particular foods.  Prior to  the onset of this, she rarely had heartburn or indigestion.  She denies  any dysphagia or odynophagia.  She does have early satiety and denies  any anorexia.  She tells me if she does not eat, she does not have pain  and that food occasionally makes the pain worse if it is already  present.  She denies any new medications.  She denies any change in her  bowel habits.  She generally has anywhere between 2 or 3 brown stools  per day.  She denies any rectal bleeding or melena.  Denies any mucus in  her stool.  Denies any fever or chills.  Denies any unintentional weight  loss.  She has lost about 10 pounds, but tells me that much of this was  intentional.  She has tried clidium  which does not seem to make a much  difference.  She believes she has tried some other medications for her  pain, but does not remember the names.   PAST MEDICAL AND SURGICAL HISTORY:  PCOS, chronic back pain.  She had a  L4-L5 laminectomy and she had tonsillectomy at age 24.   CURRENT MEDICATIONS:  1. Relafen 500 mg b.i.d. p.Kaylan.  2. Phenergan 25 mg p.Kaylan.  3. Robaxin 750 mg p.Kaylan.  4. Clidium 5/2.5 mg p.Kaylan.  5. Ibuprofen p.Kaylan.   ALLERGIES:  No known drug allergies.   FAMILY HISTORY:  Positive for maternal grandmother diagnosed with colon  cancer in her 52s.  Otherwise, no known family history of inflammatory  bowel disease, colorectal carcinoma, or other chronic GI problems.  Mother is healthy.  Father deceased at 56.  He had a history of heart  disease and died secondary  to an accident.  She has 2 healthy sisters.   SOCIAL HISTORY:  Kayla Floyd has been married for 6 months.  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  She  works night shift.  She has a history of smoking about a pack of  cigarettes a week for about 10 years, but currently does not smoke.  She  consumes a couple of alcoholic beverages per month and denies any drug  use.   REVIEW OF SYSTEMS:  GU:  She denies any dysuria, hematuria, or increased  urinary frequency.  GYN:  Her last menstrual period was January 2009.  She has very irregular cycles.  She at times has had cycles induced with  Provera.  She tells me that she took a urine pregnancy test within the  last month which was negative and 1 was done previously at Dr. Hall Busing  office which was negative as well.   PHYSICAL EXAMINATION:  VITAL SIGNS:  Weight 209 pounds, height 5 feet 6  inches, temperature 97.5, blood pressure 118/82, and pulse 76.  GENERAL:  Kayla Floyd is an obese Caucasian female who is alert,  oriented, pleasant, cooperative in no acute distress.  HEENT.  Clear.  Nonicteric.  Conjunctivae pink.  Oropharynx  pink, moist,  and without lesions.  NECK:  Supple without mass or thyromegaly.  HEART:  Regular rate and rhythm.  Normal S1 and S2 without murmurs,  clicks, rubs, or gallops.  LUNGS:  Clear to auscultation bilaterally.  ABDOMEN:  Protuberant with positive bowel sounds x4.  No bruits  auscultated.  Soft and nondistended.  She does have moderate tenderness  to left lower quadrant to deep palpation.  There is no rebound  tenderness or guarding.  No hepatosplenomegaly or masses.  EXTREMITIES:  Without clubbing or edema.   IMPRESSION:  Kayla Floyd is a 30 year old Caucasian female with a couple-  month history of at times severe abdominal pain along with nausea.  Symptoms are usually worse postprandially.  There is no vomiting or  change of bowel habits, therefore this cannot be classified as irritable  bowel syndrome.  Differentials include gallbladder disease,  diverticulitis, ovarian cyst etiology, renal lithiasis, and peptic ulcer  disease would be less likely given the location of her pain.  1. We would check CBC, amylase, lipase, LFTS, MET-7.  2. Urine pregnancy.  3. CT of the abdomen and pelvis with IV and oral contrast.  4. If CT is negative, would pursue HIDA scan versus EGD after a trial      of PPI.   Thank you Dr. Willaim Floyd for allowing [place] to participate in the care of Ms.  Floyd.      Kayla Floyd, N.P.      Kayla Floyd, M.D.  Electronically Signed    KJ/MEDQ  D:  01/15/2008  T:  01/16/2008  Job:  [id number]

## 2010-08-07 NOTE — Assessment & Plan Note (Signed)
[facility]                          [facility] NOTE   NAME:Kayla Floyd                     MRN:          [id number]  DATE:06/23/2008                            DOB:          [birth year]    PRIMARY CARDIOLOGIST:  Jonelle Sidle, MD ([place]).   REASON FOR CONSULTATION:  Ms.  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   Kayla Floyd has not undergone any prior cardiac evaluation.  She  recently presented here at [facility] on May 04, 2008, with  complaint of chest pain and palpitations, and baseline labs indicated  normal potassium, renal function and cardiac markers.  Electrocardiogram  indicated NSR at 73 bpm, with normal axis and nonspecific ST  abnormalities.  The patient and her husband informed me, however, that  they noticed frequent PVCs on the monitor, but that this was not  formally addressed.  She and her husband then presented to the [facility] Emergency Room, later that same day, with similar  complaints.  Labs were redrawn, again indicating normal electrolytes and  renal function, as well as negative cardiac markers.  A D-dimer was also  repeated, and again within normal limits.  More recent blood work also  reveals a normal TSH level, as well.   Clinically, Kayla Floyd states that she has been experiencing  palpitations on a daily basis, since that presentation on May 04, 2008.  Her chest pain is correlated only with these palpitations, and  not associated with any exertion or activity.  She does not have any  significant symptoms with the palpitations, but did report 1 episode of  dizziness, but no frank syncope.   Kayla Floyd presents with cardiac risk factors notable for borderline  hypertension,  hypercholesterolemia, history of tobacco smoking, and  family history of premature coronary artery disease.   ALLERGIES:  GLYCEROL.   CURRENT MEDICATIONS:  1. Kapidex 60 mg daily.  2. Clomid 50 mg daily, as recommended.  3. Ciprofloxacin 500 mg b.i.d.   PAST MEDICAL HISTORY:  1. Polycystic ovarian syndrome.  2. Hypercholesterolemia.  3. Borderline hypertension.  4. Chronic back pain.  5. GERD.  6. History of gastric ulcers, secondary to nonsteroidals.   SURGICAL HISTORY:  Status post lumbar laminectomy, at [facility].   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  They are currently in the  process of undergoing a fertility workup.  She quit smoking and tobacco  about 3 years ago, however, previously smoked for approximately 10  years.  She drinks on average 3 caffeinated beverages a day.   FAMILY HISTORY:  Father deceased age 16, fatal motor vehicle accident.  History of several MIs, the first occurring at age 63.  Mother deceased  at age 42, history of stroke in her 4s.   REVIEW OF SYSTEMS:  Denies any history  of exertional angina pectoris or  significant dyspnea.  Experiences palpitations on a daily basis, with  associated chest discomfort.  Denies any frank syncope.  Has heartburn  symptoms on rare occasion.  Otherwise as per HPI, remaining systems are  negative.   PHYSICAL EXAMINATION:  VITAL SIGNS:  Blood pressure 128/87, pulse 81,  regular, and weight 197.4.  GENERAL:  A 30 year old female, moderately obese, sitting upright, no  distress.  HEENT:  Normocephalic and atraumatic.  PERRLA.  EOMI.  NECK:  Palpable carotid pulses without bruits; no JVD.  LUNGS:  Clear to auscultation in all fields.  HEART:  Regular rate and rhythm.  A soft grade 1/6 holosystolic murmur  at the base.  No rubs.  ABDOMEN:  Protuberant, intact bowel sounds.  EXTREMITIES:  Minimally palpable dorsalis pedis pulses; trace  pedal  edema.  SKIN:  Warm and dry.  MUSCULOSKELETAL:  No gross defect.  NEUROLOGIC:  No focal deficit.   IMPRESSION:  1. Tachy palpitations.      a.     Documented ventricular ectopy.  2. Atypical chest pain.      a.     Correlated with tachy palpitations.  3. History of tobacco.  4. Borderline hypertension.  5. History of hypercholesterolemia.   PLAN:  1. A 2-D echocardiogram for assessment of left ventricular function      and rule out of underlying structural abnormalities.  2. Schedule CardioNet monitoring to document any dysrhythmia,      frequency of ventricular ectopy, and to exclude nonsustained      ventricular tachycardia.  3. The patient strongly advised to eliminate caffeine from her diet,      as much as possible.  4. Schedule early clinic follow up with myself and Dr. Diona Browner in the      following 3-4 weeks, for review of study results and further      recommendations.      Rozell Searing, PA-C  Electronically Signed      Jonelle Sidle, MD  Electronically Signed   GS/MedQ  DD: 06/23/2008  DT: 06/24/2008  Job #: [id number]   cc:   Meredith Mody, MD

## 2010-08-07 NOTE — Assessment & Plan Note (Signed)
NAMEMarland Kitchen  EMBERLIE, GOTCHER               CHART#:  [id number]   DATE:  04/14/2008                       DOB:  1981/02/26   CHIEF COMPLAINT:  Follow up GERD, erosive esophagitis, and fatty liver.   PROBLEM LIST:  1. Diffuse fatty infiltration of the liver with mild transaminitis.  2. Erosive reflux esophagitis and duodenitis on EGD by Dr. Jena Gauss on      February 05, 2008/GERD.  3. Prominent endometrial stripe.  She is undergoing GYN evaluation.  4. History of polycystic ovarian syndrome.  5. Chronic back pain, status post L4-L5 laminectomy.  6. Status post tonsillectomy.   SUBJECTIVE:  The patient is a 30 year old Caucasian female, who is here  today for followup.  She is doing well.  She denies any GI complaints at  this time.  She is taking Kapidex 60 mg daily.  She did run out for  about 2 weeks and had some breakthrough upper abdominal pain and  indigestion.  This had been previously resolved on Kapidex.  She is off  NSAIDs at this time, but is asking whether she can resume them.  She has  lost 6 pounds since she was seen here 3 months ago.  Overall, she feels  very well.   CURRENT MEDICATIONS:  See the list from April 14, 2008.   ALLERGIES:  No known drug allergies.   OBJECTIVE:  VITAL SIGNS:  Weight 203 pounds, height 55 inches,  temperature 99.2, blood pressure 118/80, and pulse 96.  GENERAL:  She is an obese Caucasian female, who is alert, oriented,  pleasant, cooperative, and no acute distress.  HEENT:  Sclerae clear, nonicteric.  Conjunctivae pink.  Oropharynx pink  and moist without any lesions.  CHEST:  Heart, regular rate and rhythm.  Normal S1 and S2.  ABDOMEN:  Positive bowel sounds x4.  No bruits auscultated.  Soft,  nontender, nondistended without palpable mass or hepatosplenomegaly.  No  rebound, tenderness, or guarding.  EXTREMITIES:  Without clubbing or edema.   ASSESSMENT:  The patient is a 30 year old Caucasian female with  complicated gastroesophageal reflux  disease/erosive reflux esophagitis,  well controlled on proton pump inhibitor.  Esophagitis/duodenitis well  controlled on proton pump inhibitor.  If she is going to resume  nonsteroidal antiinflammatory drugs, she is going to need to have proton  pump inhibitor protection.   She also have fatty liver with a mild transaminitis.   PLAN:  1. Recheck LFTs, hepatitis B surface antigen, and hepatitis C      antibody.  2. She should remain on p.o. Kapidex 60 mg daily indefinitely if she      is going to remain on NSAIDs or at least for the next 3 months and      she can try having a taper if she is not going to be on NSAIDs.  3. Kapidex #31 with 5 refills.  4. Office visit in 6 months or sooner if needed.  5. She is to follow with GYN regarding her prominent endometrial      stripe.       Lorenza Burton, N.P.  Electronically Signed     R. Roetta Sessions, M.D.  Electronically Signed    KJ/MEDQ  D:  04/15/2008  T:  04/15/2008  Job:  [id number]   cc:   Dr. Meredith Mody

## 2010-08-07 NOTE — Op Note (Signed)
Kayla Floyd, MONRROY NO.:  [id number]   MEDICAL RECORD NO.:  [id number]          PATIENT TYPE:  OIB   LOCATION:  3006                         FACILITY:  [facility]   PHYSICIAN:  Hewitt Shorts, M.D.DATE OF BIRTH:  1980-09-10   DATE OF PROCEDURE:  DATE OF DISCHARGE:                               OPERATIVE REPORT   PREOPERATIVE DIAGNOSIS:  L4-L5 lumbar disc herniation.   POSTOPERATIVE DIAGNOSIS:  L4-L5 lumbar disc herniation.   PROCEDURE:  Bilateral L4-L5 lumbar laminotomy, microdiscectomy with  microdissection.   SURGEON:  Hewitt Shorts, M.D.   ASSISTANT:  Nelia Shi. Georgina Peer and Stefani Dama, M.D.   ANESTHESIA:  General endotracheal.   INDICATIONS FOR PROCEDURE:  The patient is a 30 year old woman who  presented with low back and bilateral lumbar radicular pain.  The  patient was found to have a moderately large central L4-L5 disc  herniation, a small disc herniation at L5-S1.  She was found to be  symptomatic at the L4-L5, L5-S1.  Decision was made to proceed with  bilateral L4-L5 laminotomy and microdiscectomy.   PROCEDURE:  The patient was brought to the operating room and placed  under general endotracheal anesthesia.  The patient was turned to a  prone position.  Lumbar region was prepped with Betadine scrub and  solution and draped in a sterile fashion.  The midline was infiltrated  with local anesthetic with epinephrine and x-ray was taken.  The L4-L5  level was identified.  A midline incision was made over the L4-L5 and  carried down through the subcutaneous tissue.  Bipolar coated  electrocautery was used to maintain hemostasis.  Dissection was carried  down through the lumbar fascia, which was incised bilaterally.  The  paraspinal muscles were dissected from the spinous process and lamina in  a subperiosteal fashion.  Self-retaining retractor was placed and then  an x-ray was taken to the localize the L4-L5 level, and then bilateral  laminotomies was performed using the  X-Max drill and Kerrison punches.  Edges of bone were waxed as needed and ligamentum flavum was carefully  removed bilaterally, and we identified the thecal sac and exiting L5  nerve root bilaterally.  A microscope was draped and brought into the  field to provide additional navigation, illumination, and visualization.  The remainder of the thecal pressure was performed using microdissection  and microsurgical technique.   We have examined the ventral epidural space, large broad-based L4-L5  disc herniation with the greatest extent centrally was identified inside  the annulus bilaterally, and proceeded with a thorough discectomy with  particular attention paid to the midline.  Thorough discectomy was  performed using a variety of microcurettes and pituitary rongeurs and  good decompression in the thecal sac and exiting L5 nerve root was  achieved bilaterally.  All these fragments of disc materials removed  from both the disc space and the epidural space and good decompression  of the neural structures were achieved.  Once the decompression was  completed, hemostasis was established with the use of bipolar cautery as  well as Gelfoam soaked in thrombin, although  Gelfoam that was removed  prior to closure.  The wound was irrigated extensively with bacitracin  solution and hemostasis was confirmed, and then we proceeded with  closure.  Prior to closure, we instilled 2 mL of fentanyl with 80 mg of  Depo-Medrol into the epidural space.  Deep fascia was closed with  interrupted undyed #1 Vicryl suture, Scarpa fascia was closed with  interrupted undyed #1 Vicryl sutures.  The subcutaneous and subcuticular  was closed with interrupted inverted 2-0 and 3-0 undyed Vicryl sutures,  and the skin was approximated with Dermabond.  The procedure was  tolerated well.  The estimated blood loss was 50 mL.  Sponge and needle  count were correct.  Following surgery, the  patient was turned back to  supine position, to be reversed from the anesthetic, extubated, and  transferred to the recovery room for further care.      Hewitt Shorts, M.D.  Electronically Signed     RWN/MEDQ  D:  02/23/2007  T:  02/23/2007  Job:  [id number]

## 2010-09-02 ENCOUNTER — Emergency Department (HOSPITAL_COMMUNITY)
Admission: EM | Admit: 2010-09-02 | Discharge: 2010-09-02 | Disposition: A | Discharge status: Home / Self Care | Payer: Self-pay | Attending: Emergency Medicine | Admitting: Emergency Medicine

## 2010-09-02 DIAGNOSIS — N390 Urinary tract infection, site not specified: Secondary | ICD-10-CM | POA: Insufficient documentation

## 2010-09-02 DIAGNOSIS — R002 Palpitations: Secondary | ICD-10-CM | POA: Insufficient documentation

## 2010-09-02 DIAGNOSIS — E282 Polycystic ovarian syndrome: Secondary | ICD-10-CM | POA: Insufficient documentation

## 2010-09-02 DIAGNOSIS — R000 Tachycardia, unspecified: Secondary | ICD-10-CM | POA: Insufficient documentation

## 2010-09-02 DIAGNOSIS — R748 Abnormal levels of other serum enzymes: Secondary | ICD-10-CM | POA: Insufficient documentation

## 2010-09-02 DIAGNOSIS — D649 Anemia, unspecified: Secondary | ICD-10-CM | POA: Insufficient documentation

## 2010-09-02 LAB — URINALYSIS, ROUTINE W REFLEX MICROSCOPIC
Bilirubin Urine: NEGATIVE
Ketones, ur: NEGATIVE mg/dL
Specific Gravity, Urine: 1.02 (ref 1.005–1.030)
Urobilinogen, UA: 0.2 mg/dL (ref 0.0–1.0)

## 2010-09-02 LAB — DIFFERENTIAL
Basophils Absolute: 0 10*3/uL (ref 0.0–0.1)
Lymphocytes Relative: 21 % (ref 12–46)
Lymphs Abs: 2 10*3/uL (ref 0.7–4.0)
Neutro Abs: 6.9 10*3/uL (ref 1.7–7.7)

## 2010-09-02 LAB — URINE MICROSCOPIC-ADD ON

## 2010-09-02 LAB — COMPREHENSIVE METABOLIC PANEL
ALT: 62 U/L — ABNORMAL HIGH (ref 0–35)
Alkaline Phosphatase: 72 U/L (ref 39–117)
CO2: 25 mEq/L (ref 19–32)
Chloride: 97 mEq/L (ref 96–112)
GFR calc Af Amer: >60 mL/min (ref >60)
GFR calc non Af Amer: >60 mL/min (ref >60)
Glucose, Bld: 113 mg/dL — ABNORMAL HIGH (ref 70–99)
Potassium: 3.7 mEq/L (ref 3.5–5.1)
Sodium: 134 mEq/L — ABNORMAL LOW (ref 135–145)
Total Bilirubin: 0.2 mg/dL — ABNORMAL LOW (ref 0.3–1.2)
Total Protein: 7.7 g/dL (ref 6.0–8.3)

## 2010-09-02 LAB — CBC
HCT: 31.9 % — ABNORMAL LOW (ref 36.0–46.0)
Hemoglobin: 10.7 g/dL — ABNORMAL LOW (ref 12.0–15.0)
MCV: 81 fL (ref 78.0–100.0)
RBC: 3.94 MIL/uL (ref 3.87–5.11)
WBC: 9.6 10*3/uL (ref 4.0–10.5)

## 2010-09-02 LAB — D-DIMER, QUANTITATIVE: D-Dimer, Quant: 0.29 ug/mL-FEU (ref 0.00–0.48)

## 2010-09-05 ENCOUNTER — Other Ambulatory Visit: Payer: Self-pay | Admitting: Cardiology

## 2010-09-07 ENCOUNTER — Other Ambulatory Visit: Payer: Self-pay | Admitting: *Deleted

## 2010-09-07 NOTE — Telephone Encounter (Signed)
After reviewing chart, refill for atenolol already sent back by [facility]. Office. No refills authorized from this encounter. [facility] informed.

## 2010-09-20 ENCOUNTER — Encounter: Payer: Self-pay | Admitting: Internal Medicine

## 2010-09-27 ENCOUNTER — Other Ambulatory Visit: Payer: Self-pay | Admitting: *Deleted

## 2010-09-27 MED ORDER — PREGABALIN 100 MG PO CAPS: 100.0000 mg | ORAL_CAPSULE | Freq: Two times a day (BID) | ORAL | Status: DC

## 2010-09-27 NOTE — Telephone Encounter (Signed)
Faxed script back to [facility] in [place] @ [phone]...09/27/10@1 :44pm/LMB

## 2010-11-14 ENCOUNTER — Other Ambulatory Visit: Payer: Self-pay | Admitting: Internal Medicine

## 2010-11-14 MED ORDER — DICLOFENAC SODIUM 75 MG PO TBEC: 75.0000 mg | DELAYED_RELEASE_TABLET | Freq: Two times a day (BID) | ORAL | Status: DC

## 2010-11-14 MED ORDER — METHOCARBAMOL 750 MG PO TABS: 750.0000 mg | ORAL_TABLET | Freq: Three times a day (TID) | ORAL | Status: DC

## 2010-11-14 NOTE — Telephone Encounter (Signed)
Faxed Lyrica script back to [facility] @ [phone] other two was sent electronically....11/14/10@3 :20pm/LMB

## 2010-11-14 NOTE — Telephone Encounter (Signed)
Refills provided for 1 month + 1 refill unless otherwise specified

## 2010-12-05 ENCOUNTER — Ambulatory Visit: Payer: Self-pay | Admitting: Internal Medicine

## 2010-12-05 DIAGNOSIS — Z0289 Encounter for other administrative examinations: Secondary | ICD-10-CM

## 2010-12-14 LAB — I-STAT 8, (EC8 V) (CONVERTED LAB)
BUN: 5 — ABNORMAL LOW
Chloride: 106
Glucose, Bld: 112 — ABNORMAL HIGH
HCT: 40
Hemoglobin: 13.6
Operator id: 161631
Sodium: 137

## 2010-12-14 LAB — URINALYSIS, ROUTINE W REFLEX MICROSCOPIC
Ketones, ur: NEGATIVE
Nitrite: NEGATIVE
Protein, ur: NEGATIVE
pH: 5.5

## 2010-12-14 LAB — DIFFERENTIAL
Basophils Absolute: 0.1
Basophils Relative: 1
Neutro Abs: 5.8
Neutrophils Relative %: 61

## 2010-12-14 LAB — POCT I-STAT CREATININE
Creatinine, Ser: 0.8
Operator id: 161631

## 2010-12-14 LAB — CBC
MCHC: 35.3
RDW: 12.4

## 2010-12-25 ENCOUNTER — Telehealth: Payer: Self-pay

## 2010-12-25 ENCOUNTER — Encounter: Payer: Self-pay | Admitting: Internal Medicine

## 2010-12-25 LAB — H. PYLORI ANTIBODY, IGG: H Pylori IgG: <0.4

## 2010-12-25 NOTE — Telephone Encounter (Signed)
Darl Pikes to make appt.

## 2010-12-25 NOTE — Telephone Encounter (Signed)
Agree 

## 2010-12-25 NOTE — Telephone Encounter (Signed)
Received request from [facility] for PA for pts dexilant. Pt has not been seen in the office since 04/15/2008. Pt was given 3 refills on 12/22/2009 and was told she needed ov to further refills. Pt was sent a letter to call and make appt. Informed [facility] that we needed ov prior to doing PA.

## 2010-12-27 NOTE — Telephone Encounter (Signed)
I could not reach pt by phone, mailed letter for her to call office to set up OV to further her refills

## 2011-01-01 LAB — HCG, SERUM, QUALITATIVE: Preg, Serum: NEGATIVE

## 2011-01-01 LAB — CBC
HCT: 35.4 — ABNORMAL LOW
Hemoglobin: 12.3
MCHC: 34.6
MCV: 86.8
Platelets: 348
RBC: 4.08
RDW: 12
WBC: 9

## 2011-01-18 ENCOUNTER — Telehealth: Payer: Self-pay | Admitting: *Deleted

## 2011-01-18 NOTE — Telephone Encounter (Signed)
Left msg on vm requesting md to rx antibiotic for cold sxs. Called pt back to let her know she would need ov before antibiotic could be rx. 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 Advise pt if not able to see md she could go to urgent care again due to office policy md would have to see pt for antibiotic...01/18/11@10 :33am/LMB

## 2011-02-06 ENCOUNTER — Other Ambulatory Visit: Payer: Self-pay | Admitting: Internal Medicine

## 2011-03-22 ENCOUNTER — Ambulatory Visit (INDEPENDENT_AMBULATORY_CARE_PROVIDER_SITE_OTHER): Payer: Self-pay | Admitting: Internal Medicine

## 2011-03-22 ENCOUNTER — Other Ambulatory Visit: Payer: Self-pay | Admitting: Internal Medicine

## 2011-03-22 ENCOUNTER — Encounter: Payer: Self-pay | Admitting: Internal Medicine

## 2011-03-22 DIAGNOSIS — F411 Generalized anxiety disorder: Secondary | ICD-10-CM

## 2011-03-22 DIAGNOSIS — K219 Gastro-esophageal reflux disease without esophagitis: Secondary | ICD-10-CM

## 2011-03-22 MED ORDER — SERTRALINE HCL 50 MG PO TABS: 50.0000 mg | ORAL_TABLET | Freq: Every day | ORAL | Status: DC

## 2011-03-22 MED ORDER — OMEPRAZOLE 40 MG PO CPDR: 40.0000 mg | DELAYED_RELEASE_CAPSULE | Freq: Every day | ORAL | Status: DC

## 2011-03-22 MED ORDER — ALPRAZOLAM 0.5 MG PO TABS: 0.5000 mg | ORAL_TABLET | Freq: Three times a day (TID) | ORAL | Status: AC | PRN

## 2011-03-22 NOTE — Patient Instructions (Addendum)
It was good to see you today. Wean off paxil and onto Zoloft as discussed - also use xanax as needed -  Change Kapidex to omeprazole Your prescription(s) have been submitted to your pharmacy. Please take as directed and contact our office if you believe you are having problem(s) with the medication(s). Continue with plans for counseling as discussed Good luck with your planned move to [place]

## 2011-03-22 NOTE — Progress Notes (Signed)
  Subjective:    Patient ID: Kayla Floyd, female    DOB: Jan 08, 1981, 30 y.o.   MRN: [id number]  HPI Here for increase in anxiety symptoms   Also reviewed chronic med issues:  continued right knee pain. 05/2010 Injury precipitated by descending stairs - no twisting - no fall >>ACL tear No surgery due to no insurance  depression and anxiety  - continued overwhelming fatigue and excess sleep patterns -  tol generic paxil - no adv se, 100% compliance - worse with new stressors (mom living with pt/family)  PCOS - was on fertility tx for same prior to syncope spells and dx of POTS now on BCP to help control bleeding also on metformin - no adv SE will cont to follow with gyn for same -  POTS - on low dose atenolol as intol of lopressor and diltiazem- follows with cards annually for eval and tx of same -  recommended by cards to remain out of work in 2011- letter by cards to insurance reviewed  cont orthosatic dizzy symptoms, only showers if someone in house to supervise - no further syncope events not driving  spine DDD - s/p cervical surg for same as well as lumbar laminectomy takes daily NSAIDs and lyrica for control of pain + muscle relaxants  dyslipidemia - related to PCOS and weight gain never on meds for same dx  Past Medical History  Diagnosis Date  . POLYCYSTIC OVARIAN DISEASE   . HYPERLIPIDEMIA   . MIGRAINE HEADACHE   . HYPERTENSION   . GERD   . BACK PAIN, CHRONIC   . Palpitations   . DYSAUTONOMIA 2011    "POTS" , recurrent syncope - follows with cards for same  . ANXIETY   . Tear meniscus knee 06/14/2010    right    Review of Systems     Objective:   Physical Exam BP 118/80  Pulse 84  Temp(Src) 98.5 F (36.9 C) (Oral)  Wt 216 lb 6.4 oz (98.158 kg)  SpO2 96% Wt Readings from Last 3 Encounters:  03/22/11 216 lb 6.4 oz (98.158 kg)  07/25/10 206 lb (93.441 kg)  06/14/10 207 lb (93.895 kg)   Constitutional: She is overwright - appears  well-developed and well-nourished. No distress. spouse and kids at side Cardiovascular: Normal rate, regular rhythm and normal heart sounds.  No murmur heard. No BLE edema. Pulmonary/Chest: Effort normal and breath sounds normal. No respiratory distress. She has no wheezes.  Psychiatric: She has a dysthymic mood and affect. Her behavior is normal. Judgment and thought content normal.    Lab Results  Component Value Date   WBC 9.6 09/02/2010   HGB 10.7* 09/02/2010   HCT 31.9* 09/02/2010   PLT 275 09/02/2010   GLUCOSE 113* 09/02/2010   CHOL 232 06/06/2008   HDL 28 06/06/2008   LDLCALC 128 06/06/2008   ALT 62* 09/02/2010   AST 44* 09/02/2010   NA 134* 09/02/2010   K 3.7 09/02/2010   CL 97 09/02/2010   CREATININE 0.73 09/02/2010   BUN 9 09/02/2010   CO2 25 09/02/2010   TSH 0.84 06/06/2010        Assessment & Plan:

## 2011-03-22 NOTE — Assessment & Plan Note (Signed)
Wean off paxil, start sertraline - instructions on wean provided Also xanax to use prn Support offered - has counseling appt 04/02/11 sched with [employer]

## 2011-04-19 ENCOUNTER — Other Ambulatory Visit: Payer: Self-pay | Admitting: Internal Medicine

## 2011-04-23 ENCOUNTER — Other Ambulatory Visit: Payer: Self-pay

## 2011-04-23 ENCOUNTER — Encounter (HOSPITAL_COMMUNITY): Payer: Self-pay | Admitting: *Deleted

## 2011-04-23 ENCOUNTER — Emergency Department (HOSPITAL_COMMUNITY)
Admission: EM | Admit: 2011-04-23 | Discharge: 2011-04-23 | Disposition: A | Discharge status: Home / Self Care | Payer: Self-pay | Attending: Emergency Medicine | Admitting: Emergency Medicine

## 2011-04-23 ENCOUNTER — Other Ambulatory Visit: Payer: Self-pay | Admitting: *Deleted

## 2011-04-23 DIAGNOSIS — R002 Palpitations: Secondary | ICD-10-CM | POA: Insufficient documentation

## 2011-04-23 DIAGNOSIS — Z87891 Personal history of nicotine dependence: Secondary | ICD-10-CM | POA: Insufficient documentation

## 2011-04-23 DIAGNOSIS — R42 Dizziness and giddiness: Secondary | ICD-10-CM | POA: Insufficient documentation

## 2011-04-23 DIAGNOSIS — K219 Gastro-esophageal reflux disease without esophagitis: Secondary | ICD-10-CM | POA: Insufficient documentation

## 2011-04-23 DIAGNOSIS — I498 Other specified cardiac arrhythmias: Secondary | ICD-10-CM | POA: Insufficient documentation

## 2011-04-23 DIAGNOSIS — E282 Polycystic ovarian syndrome: Secondary | ICD-10-CM | POA: Insufficient documentation

## 2011-04-23 DIAGNOSIS — Z800 Family history of malignant neoplasm of digestive organs: Secondary | ICD-10-CM | POA: Insufficient documentation

## 2011-04-23 DIAGNOSIS — M542 Cervicalgia: Secondary | ICD-10-CM | POA: Insufficient documentation

## 2011-04-23 DIAGNOSIS — R51 Headache: Secondary | ICD-10-CM | POA: Insufficient documentation

## 2011-04-23 DIAGNOSIS — G8929 Other chronic pain: Secondary | ICD-10-CM | POA: Insufficient documentation

## 2011-04-23 DIAGNOSIS — M549 Dorsalgia, unspecified: Secondary | ICD-10-CM | POA: Insufficient documentation

## 2011-04-23 DIAGNOSIS — F411 Generalized anxiety disorder: Secondary | ICD-10-CM | POA: Insufficient documentation

## 2011-04-23 HISTORY — DX: Tachycardia, unspecified: R00.0

## 2011-04-23 HISTORY — DX: Postural orthostatic tachycardia syndrome (POTS): G90.A

## 2011-04-23 HISTORY — DX: Other specified cardiac arrhythmias: I49.8

## 2011-04-23 HISTORY — DX: Orthostatic hypotension: I95.1

## 2011-04-23 LAB — CBC
Platelets: 315 10*3/uL (ref 150–400)
RDW: 12.4 % (ref 11.5–15.5)
WBC: 10.6 10*3/uL — ABNORMAL HIGH (ref 4.0–10.5)

## 2011-04-23 LAB — BASIC METABOLIC PANEL
CO2: 29 mEq/L (ref 19–32)
Calcium: 10.8 mg/dL — ABNORMAL HIGH (ref 8.4–10.5)
Potassium: 3.6 mEq/L (ref 3.5–5.1)
Sodium: 139 mEq/L (ref 135–145)

## 2011-04-23 LAB — DIFFERENTIAL
Basophils Absolute: 0 10*3/uL (ref 0.0–0.1)
Eosinophils Relative: 2 % (ref 0–5)
Lymphocytes Relative: 31 % (ref 12–46)
Neutro Abs: 6.5 10*3/uL (ref 1.7–7.7)
Neutrophils Relative %: 61 % (ref 43–77)

## 2011-04-23 MED ORDER — SODIUM CHLORIDE 0.9 % IV BOLUS (SEPSIS)
1000.0000 mL | Freq: Once | INTRAVENOUS | Status: AC
  Administered 2011-04-23: 1000 mL via INTRAVENOUS

## 2011-04-23 MED ORDER — HYDROMORPHONE HCL PF 1 MG/ML IJ SOLN
1.0000 mg | Freq: Once | INTRAMUSCULAR | Status: AC
  Administered 2011-04-23: 1 mg via INTRAVENOUS
  Filled 2011-04-23: qty 1

## 2011-04-23 MED ORDER — KETOROLAC TROMETHAMINE 30 MG/ML IJ SOLN
30.0000 mg | Freq: Once | INTRAMUSCULAR | Status: AC
  Administered 2011-04-23: 30 mg via INTRAVENOUS
  Filled 2011-04-23: qty 1

## 2011-04-23 MED ORDER — PROMETHAZINE HCL 12.5 MG PO TABS: 12.5000 mg | ORAL_TABLET | Freq: Four times a day (QID) | ORAL | Status: DC | PRN

## 2011-04-23 MED ORDER — METHOCARBAMOL 750 MG PO TABS: 750.0000 mg | ORAL_TABLET | Freq: Three times a day (TID) | ORAL | Status: DC

## 2011-04-23 MED ORDER — HYDROCODONE-ACETAMINOPHEN 5-325 MG PO TABS: 1.0000 | ORAL_TABLET | ORAL | Status: AC | PRN

## 2011-04-23 MED ORDER — ONDANSETRON HCL 4 MG/2ML IJ SOLN
4.0000 mg | Freq: Once | INTRAMUSCULAR | Status: AC
  Administered 2011-04-23: 4 mg via INTRAVENOUS
  Filled 2011-04-23: qty 2

## 2011-04-23 NOTE — Telephone Encounter (Signed)
Thanks for refilling robaxin - lyrica rx updated - ok to print and i will sign - thanks

## 2011-04-23 NOTE — ED Notes (Signed)
Pt states having ha, w/ n&v for the last 5 days. Any movements make condition worse. Pt states has been laying flat for the most of the time only getting up to use the restroom. Pt did home iv fluid Friday or sat 2 liters.

## 2011-04-23 NOTE — ED Notes (Signed)
Pt states she is feeling better but prefers to be laying flat.

## 2011-04-23 NOTE — ED Provider Notes (Signed)
History     CSN: [id number]  Arrival date & time 04/23/11  0213   First MD Initiated Contact with Patient 04/23/11 [phone]      Chief Complaint  Patient presents with  . Headache  . Neck Pain  . Nausea  . Palpitations    (Consider location/radiation/quality/duration/timing/severity/associated sxs/prior treatment) HPI Kayla Floyd is a 31 y.o. female  With a h/o POTS syndrome, migraine headaches, anxiety, chronic back pain, GERD, who presents to the Emergency Department complaining of headache, neck pain, dizziness, nausea c/w her POTS syndrome x 5 days. She has been out of her lyrica and methocarbamol for several days. 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 She denies any recent fever, chills, vision changes, difficulty speaking or swallowing.  PCP Dr. Felicity Coyer Cardiologist Dr. Lynelle Smoke  Past Medical History  Diagnosis Date  . POLYCYSTIC OVARIAN DISEASE   . HYPERLIPIDEMIA   . MIGRAINE HEADACHE   . GERD   . BACK PAIN, CHRONIC   . Palpitations   . DYSAUTONOMIA 2011    "POTS" , recurrent syncope - follows with cards for same  . ANXIETY   . Tear meniscus knee 06/14/2010    right  . POTS (postural orthostatic tachycardia syndrome)     Past Surgical History  Procedure Date  . Cervical spine 2010    ACDF c5-6  . Tonsillectomy and adenoidectomy   . Lumbar laminectomy 2008    Family History  Problem Relation Age of Onset  . Colon cancer Maternal Grandmother 14    History  Substance Use Topics  . Smoking status: Former Smoker -- 1.0 packs/day for 10 years    Quit date: 03/25/2005  . Smokeless tobacco: Not on file   Comment: She had hx of smokiong about a pack week for about 10 years  . Alcohol Use: Yes     occasionally    OB History    Grav Para Term Preterm Abortions TAB SAB Ect Mult Living                  Review of Systems 10 Systems reviewed and are  negative for acute change except as noted in the HPI. Allergies  Review of patient's allergies indicates no known allergies.  Home Medications   Current Outpatient Rx  Name Route Sig Dispense Refill  . ATENOLOL 25 MG PO TABS  TAKE ONE TABLET BY MOUTH EVERY DAY 30 tablet 6  . CETIRIZINE HCL 10 MG PO TABS Oral Take 10 mg by mouth daily.      Marland Kitchen LYRICA 75 MG PO CAPS  TAKE ONE CAPSULE BY MOUTH TWICE DAILY 14 each 0  . METHOCARBAMOL 750 MG PO TABS Oral Take 1 tablet (750 mg total) by mouth 3 (three) times daily. 90 tablet 1  . ONE-DAILY MULTI VITAMINS PO TABS Oral Take 1 tablet by mouth daily.      Azzie Roup ACE-ETH ESTRAD-FE 1-20 MG-MCG(24) PO TABS Oral Take by mouth daily.      Marland Kitchen OMEPRAZOLE 40 MG PO CPDR Oral Take 1 capsule (40 mg total) by mouth daily. 30 capsule 6  . PROMETHAZINE HCL 12.5 MG PO TABS Oral Take 12.5 mg by mouth every 6 (six) hours as needed.      . SERTRALINE HCL 50 MG PO TABS Oral Take 1 tablet (50 mg total) by mouth daily. 30 tablet 3    BP 141/93  Temp(Src)  98.5 F (36.9 C) (Oral)  Resp 20  Ht 5\' 5"  (1.651 m)  Wt 210 lb (95.255 kg)  BMI 34.95 kg/m2  SpO2 97%  Physical Exam  Nursing note and vitals reviewed. Constitutional: She is oriented to person, place, and time. She appears well-developed and well-nourished.  HENT:  Head: Normocephalic and atraumatic.  Right Ear: External ear normal.  Left Ear: External ear normal.  Nose: Nose normal.  Mouth/Throat: No oropharyngeal exudate.  Eyes: Conjunctivae and EOM are normal. Pupils are equal, round, and reactive to light.  Neck: Normal range of motion. Neck supple.       Soft tissue tenderness across the neck  Cardiovascular: Normal rate, normal heart sounds and intact distal pulses.   Pulmonary/Chest: Effort normal and breath sounds normal.  Abdominal: Soft. Bowel sounds are normal.  Musculoskeletal: Normal range of motion.  Neurological: She is alert and oriented to person, place, and time. She has normal  reflexes.  Skin: Skin is warm and dry.  Psychiatric: She has a normal mood and affect.    ED Course  Procedures (including critical care time) Results for orders placed during the hospital encounter of 04/23/11  CBC      Component Value Range   WBC 10.6 (*) 4.0 - 10.5 (K/uL)   RBC 4.44  3.87 - 5.11 (MIL/uL)   Hemoglobin 13.1  12.0 - 15.0 (g/dL)   HCT 40.9  81.1 - 91.4 (%)   MCV 82.7  78.0 - 100.0 (fL)   MCH 29.5  26.0 - 34.0 (pg)   MCHC 35.7  30.0 - 36.0 (g/dL)   RDW 78.2  95.6 - 21.3 (%)   Platelets 315  150 - 400 (K/uL)  DIFFERENTIAL      Component Value Range   Neutrophils Relative 61  43 - 77 (%)   Neutro Abs 6.5  1.7 - 7.7 (K/uL)   Lymphocytes Relative 31  12 - 46 (%)   Lymphs Abs 3.3  0.7 - 4.0 (K/uL)   Monocytes Relative 6  3 - 12 (%)   Monocytes Absolute 0.6  0.1 - 1.0 (K/uL)   Eosinophils Relative 2  0 - 5 (%)   Eosinophils Absolute 0.2  0.0 - 0.7 (K/uL)   Basophils Relative 0  0 - 1 (%)   Basophils Absolute 0.0  0.0 - 0.1 (K/uL)  BASIC METABOLIC PANEL      Component Value Range   Sodium 139  135 - 145 (mEq/L)   Potassium 3.6  3.5 - 5.1 (mEq/L)   Chloride 97  96 - 112 (mEq/L)   CO2 29  19 - 32 (mEq/L)   Glucose, Bld 94  70 - 99 (mg/dL)   BUN 8  6 - 23 (mg/dL)   Creatinine, Ser 0.86  0.50 - 1.10 (mg/dL)   Calcium 57.8 (*) 8.4 - 10.5 (mg/dL)   GFR calc non Af Amer >90  >90 (mL/min)   GFR calc Af Amer >90  >90 (mL/min)    Date: 04/23/2011  0224  Rate:106  Rhythm: sinus tachycardia  QRS Axis: normal  Intervals: normal  ST/T Wave abnormalities: nonspecific T wave changes  Conduction Disutrbances:none  Narrative Interpretation:   Old EKG Reviewed: unchanged c/w 09/02/10  0420 Patient states pain is 4/10 from an 8/10. Received first liter of IVF. 2nd liter to run.Additional analgesics ordered. [id number] Patient has received 2 liters IVF, analgesics x 2, anti inflammatory and antiemetic. Feeling better.   MDM  Patient with h/o POTS here with headache, neck pain,  palpitations, dizziness all similar to her previous POTS symptoms. Labs are unremarkable, EKG is non provocative. She has received IVF, analgesics, antiinflammatory  and antiemetic with improvement. Pt feels improved after observation and/or treatment in ED.Pt stable in ED with no significant deterioration in condition.The patient appears reasonably screened and/or stabilized for discharge and I doubt any other medical condition or other [facility] requiring further screening, evaluation, or treatment in the ED at this time prior to discharge.  MDM Reviewed: nursing note and vitals Interpretation: labs and ECG Total time providing critical care: 35.           Nicoletta Dress. Colon Branch, MD 04/23/11 [id number]

## 2011-04-23 NOTE — ED Notes (Signed)
Pt alert & oriented x4, stable gait. Pt given discharge instructions, paperwork & prescription(s), pt verbalized understanding. Pt left department w/ no further questions.  

## 2011-04-23 NOTE — Telephone Encounter (Signed)
Left msg on vm wife is needing refills on her robaxin & lyrica. Per pharmacy med was denied on Friday. Had to go to Er this am due to neck pain. Requesting refills to be [facility] to [facility]. Called wife/husband can send in robaxin, but lurica has to approve by md. Pt states md rx lyica 100 mg take 1 twice a day not 75 mg that is in the system. Will give them call back tomorrow with md response....04/23/11@2 :43pm/LMB

## 2011-04-23 NOTE — ED Notes (Signed)
C/o HA, neck pain, dizziness, nausea, palpitations x 5 days; reports hx of POTS

## 2011-04-24 MED ORDER — PREGABALIN 100 MG PO CAPS: 100.0000 mg | ORAL_CAPSULE | Freq: Two times a day (BID) | ORAL | Status: DC

## 2011-04-24 NOTE — Telephone Encounter (Signed)
Called husband left msg on vm md approve Lyrica fax script back to [facility].Marland KitchenMarland Kitchen1/30/13@9 :19am/LMB

## 2011-05-27 ENCOUNTER — Encounter (HOSPITAL_COMMUNITY): Payer: Self-pay | Admitting: *Deleted

## 2011-05-27 ENCOUNTER — Emergency Department (HOSPITAL_COMMUNITY)
Admission: EM | Admit: 2011-05-27 | Discharge: 2011-05-28 | Disposition: A | Discharge status: Home / Self Care | Payer: Self-pay | Attending: Emergency Medicine | Admitting: Emergency Medicine

## 2011-05-27 DIAGNOSIS — M25569 Pain in unspecified knee: Secondary | ICD-10-CM | POA: Insufficient documentation

## 2011-05-27 DIAGNOSIS — Y92009 Unspecified place in unspecified non-institutional (private) residence as the place of occurrence of the external cause: Secondary | ICD-10-CM | POA: Insufficient documentation

## 2011-05-27 DIAGNOSIS — E282 Polycystic ovarian syndrome: Secondary | ICD-10-CM | POA: Insufficient documentation

## 2011-05-27 DIAGNOSIS — G43909 Migraine, unspecified, not intractable, without status migrainosus: Secondary | ICD-10-CM | POA: Insufficient documentation

## 2011-05-27 DIAGNOSIS — G8929 Other chronic pain: Secondary | ICD-10-CM | POA: Insufficient documentation

## 2011-05-27 DIAGNOSIS — S93401A Sprain of unspecified ligament of right ankle, initial encounter: Secondary | ICD-10-CM

## 2011-05-27 DIAGNOSIS — M25461 Effusion, right knee: Secondary | ICD-10-CM

## 2011-05-27 DIAGNOSIS — M549 Dorsalgia, unspecified: Secondary | ICD-10-CM | POA: Insufficient documentation

## 2011-05-27 DIAGNOSIS — K219 Gastro-esophageal reflux disease without esophagitis: Secondary | ICD-10-CM | POA: Insufficient documentation

## 2011-05-27 DIAGNOSIS — E785 Hyperlipidemia, unspecified: Secondary | ICD-10-CM | POA: Insufficient documentation

## 2011-05-27 DIAGNOSIS — W010XXA Fall on same level from slipping, tripping and stumbling without subsequent striking against object, initial encounter: Secondary | ICD-10-CM | POA: Insufficient documentation

## 2011-05-27 DIAGNOSIS — I498 Other specified cardiac arrhythmias: Secondary | ICD-10-CM | POA: Insufficient documentation

## 2011-05-27 DIAGNOSIS — S93409A Sprain of unspecified ligament of unspecified ankle, initial encounter: Secondary | ICD-10-CM | POA: Insufficient documentation

## 2011-05-27 DIAGNOSIS — Z87891 Personal history of nicotine dependence: Secondary | ICD-10-CM | POA: Insufficient documentation

## 2011-05-27 DIAGNOSIS — F411 Generalized anxiety disorder: Secondary | ICD-10-CM | POA: Insufficient documentation

## 2011-05-27 DIAGNOSIS — M25469 Effusion, unspecified knee: Secondary | ICD-10-CM | POA: Insufficient documentation

## 2011-05-27 NOTE — ED Notes (Signed)
Fell at home around 6 pm, pain in both ankles and right knee, already has problems with knee

## 2011-05-27 NOTE — ED Notes (Signed)
Pt stated she tripped when walking down steps tonight and felt a pop in her RT ankle. Ankle has some swelling with no discoloration at this time, ice applied

## 2011-05-28 ENCOUNTER — Emergency Department (HOSPITAL_COMMUNITY): Payer: Self-pay

## 2011-05-28 MED ORDER — OXYCODONE-ACETAMINOPHEN 5-325 MG PO TABS: 1.0000 | ORAL_TABLET | ORAL | Status: AC | PRN

## 2011-05-28 MED ORDER — OXYCODONE-ACETAMINOPHEN 5-325 MG PO TABS
1.0000 | ORAL_TABLET | Freq: Once | ORAL | Status: AC
  Administered 2011-05-28: 1 via ORAL
  Filled 2011-05-28: qty 1

## 2011-05-28 NOTE — ED Notes (Signed)
Discharge instructions reviewed with pt, questions answered. Pt verbalized understanding.  

## 2011-05-28 NOTE — ED Provider Notes (Signed)
Medical screening examination/treatment/procedure(s) were performed by non-physician practitioner and as supervising physician I was immediately available for consultation/collaboration. Devoria Albe, MD, Armando Gang   Ward Givens, MD 05/28/11 [phone]

## 2011-05-28 NOTE — Discharge Instructions (Signed)
Ankle Sprain An ankle sprain is an injury to the strong, fibrous tissues (ligaments) that hold the bones of your ankle joint together.  CAUSES Ankle sprain usually is caused by a fall or by twisting your ankle. People who participate in sports are more prone to these types of injuries.  SYMPTOMS  Symptoms of ankle sprain include:  Pain in your ankle. The pain may be present at rest or only when you are trying to stand or walk.   Swelling.   Bruising. Bruising may develop immediately or within 1 to 2 days after your injury.   Difficulty standing or walking.  DIAGNOSIS  Your caregiver will ask you details about your injury and perform a physical exam of your ankle to determine if you have an ankle sprain. During the physical exam, your caregiver will press and squeeze specific areas of your foot and ankle. Your caregiver will try to move your ankle in certain ways. An X-ray exam may be done to be sure a bone was not broken or a ligament did not separate from one of the bones in your ankle (avulsion).  TREATMENT  Certain types of braces can help stabilize your ankle. Your caregiver can make a recommendation for this. Your caregiver may recommend the use of medication for pain. If your sprain is severe, your caregiver may refer you to a surgeon who helps to restore function to parts of your skeletal system (orthopedist) or a physical therapist. HOME CARE INSTRUCTIONS  Apply ice to your injury for 1 to 2 days or as directed by your caregiver. Applying ice helps to reduce inflammation and pain.  Put ice in a plastic bag.   Place a towel between your skin and the bag.   Leave the ice on for 15 to 20 minutes at a time, every 2 hours while you are awake.   Take over-the-counter or prescription medicines for pain, discomfort, or fever only as directed by your caregiver.   Keep your injured leg elevated, when possible, to lessen swelling.   If your caregiver recommends crutches, use them as  instructed. Gradually, put weight on the affected ankle. Continue to use crutches or a cane until you can walk without feeling pain in your ankle.   If you have a plaster splint, wear the splint as directed by your caregiver. Do not rest it on anything harder than a pillow the first 24 hours. Do not put weight on it. Do not get it wet. You may take it off to take a shower or bath.   You may have been given an elastic bandage to wear around your ankle to provide support. If the elastic bandage is too tight (you have numbness or tingling in your foot or your foot becomes cold and blue), adjust the bandage to make it comfortable.   If you have an air splint, you may blow more air into it or let air out to make it more comfortable. You may take your splint off at night and before taking a shower or bath.   Wiggle your toes in the splint several times per day if you are able.  SEEK MEDICAL CARE IF:   You have an increase in bruising, swelling, or pain.   Your toes feel cold.   Pain relief is not achieved with medication.  SEEK IMMEDIATE MEDICAL CARE IF: Your toes are numb or blue or you have severe pain. MAKE SURE YOU:   Understand these instructions.   Will watch your condition.     Will get help right away if you are not doing well or get worse.  Document Released: 03/11/2005 Document Revised: 02/28/2011 Document Reviewed: 10/14/2007 [facility] Patient Information 2012 [place], [place].Knee Effusion The medical term for having fluid in your knee is effusion. This is often due to an internal derangement of the knee. This means something is wrong inside the knee. Some of the causes of fluid in the knee may be torn cartilage, a torn ligament, or bleeding into the joint from an injury. Your knee is likely more difficult to bend and move. This is often because there is increased pain and pressure in the joint. The time it takes for recovery from a knee effusion depends on different factors, including:     Type of injury.   Your age.   Physical and medical conditions.   Rehabilitation Strategies.  How long you will be away from your normal activities will depend on what kind of knee problem you have and how much damage is present. Your knee has two types of cartilage. Articular cartilage covers the bone ends and lets your knee bend and move smoothly. Two menisci, thick pads of cartilage that form a rim inside the joint, help absorb shock and stabilize your knee. Ligaments bind the bones together and support your knee joint. Muscles move the joint, help support your knee, and take stress off the joint itself. CAUSES  Often an effusion in the knee is caused by an injury to one of the menisci. This is often a tear in the cartilage. Recovery after a meniscus injury depends on how much meniscus is damaged and whether you have damaged other knee tissue. Small tears may heal on their own with conservative treatment. Conservative means rest, limited weight bearing activity and muscle strengthening exercises. Your recovery may take up to 6 weeks.  TREATMENT  Larger tears may require surgery. Meniscus injuries may be treated during arthroscopy. Arthroscopy is a procedure in which your surgeon uses a small telescope like instrument to look in your knee. Your caregiver can make a more accurate diagnosis (learning what is wrong) by performing an arthroscopic procedure. If your injury is on the inner margin of the meniscus, your surgeon may trim the meniscus back to a smooth rim. In other cases your surgeon will try to repair a damaged meniscus with stitches (sutures). This may make rehabilitation take longer, but may provide better long term result by helping your knee keep its shock absorption capabilities. Ligaments which are completely torn usually require surgery for repair. HOME CARE INSTRUCTIONS  Use crutches as instructed.   If a brace is applied, use as directed.   Once you are home, an ice pack  applied to your swollen knee may help with discomfort and help decrease swelling.   Keep your knee raised (elevated) when you are not up and around or on crutches.   Only take over-the-counter or prescription medicines for pain, discomfort, or fever as directed by your caregiver.   Your caregivers will help with instructions for rehabilitation of your knee. This often includes strengthening exercises.   You may resume a normal diet and activities as directed.  SEEK MEDICAL CARE IF:   There is increased swelling in your knee.   You notice redness, swelling, or increasing pain in your knee.   An unexplained oral temperature above 102 F (38.9 C) develops.  SEEK IMMEDIATE MEDICAL CARE IF:   You develop a rash.   You have difficulty breathing.   You have any allergic  reactions from medications you may have been given.   There is severe pain with any motion of the knee.  MAKE SURE YOU:   Understand these instructions.   Will watch your condition.   Will get help right away if you are not doing well or get worse.  Document Released: 06/01/2003 Document Revised: 02/28/2011 Document Reviewed: 08/05/2007 [facility] Patient Information 2012 [place], [place].   You may take the oxycodone prescribed for pain relief.  This will make you drowsy - do not drive within 4 hours of taking this medication.   Call Dr. Thurston Hole for a recheck of your symptoms as discussed.  Use your crutches to avoid weight bearing,  Wear your braces until symptoms are improved.

## 2011-05-28 NOTE — ED Provider Notes (Signed)
History     CSN: [id number]  Arrival date & time 05/27/11  2231   First MD Initiated Contact with Patient 05/27/11 2320      Chief Complaint  Patient presents with  . Ankle Injury    (Consider location/radiation/quality/duration/timing/severity/associated sxs/prior treatment) HPI Comments: Patient presents for evaluation of injury she sustained to her right ankle and right knee after tripping off of the bottom step of a flight of stairs at 6 PM.  She landed on her right side causing inversion of her right ankle with lateral swelling and pain.  She has a previous known anterior cruciate ligament tear which she has been awaiting surgery and has developed increased pain in the right knee as well.  She sees Dr. Thurston Hole but has been putting off surgery until her insurance is in place.  She has increased swelling of the right knee and pain behind the knee which is worse with flexion and attempts at weightbearing.  She does have a knee immobilizer which she wears this evening, but has not been wearing prior to tonight's injury.  She also has crutches at home.  She has taken no pain medications prior to arrival.  She denies other injuries including no trauma to head or neck.  Denies upper extremity pain.  The history is provided by the patient.    Past Medical History  Diagnosis Date  . POLYCYSTIC OVARIAN DISEASE   . HYPERLIPIDEMIA   . MIGRAINE HEADACHE   . GERD   . BACK PAIN, CHRONIC   . Palpitations   . DYSAUTONOMIA 2011    "POTS" , recurrent syncope - follows with cards for same  . ANXIETY   . Tear meniscus knee 06/14/2010    right  . POTS (postural orthostatic tachycardia syndrome)     Past Surgical History  Procedure Date  . Cervical spine 2010    ACDF c5-6  . Tonsillectomy and adenoidectomy   . Lumbar laminectomy 2008    Family History  Problem Relation Age of Onset  . Colon cancer Maternal Grandmother 45    History  Substance Use Topics  . Smoking status: Former Smoker  -- 1.0 packs/day for 10 years    Quit date: 03/25/2005  . Smokeless tobacco: Not on file   Comment: She had hx of smokiong about a pack week for about 10 years  . Alcohol Use: Yes     occasionally    OB History    Grav Para Term Preterm Abortions TAB SAB Ect Mult Living                  Review of Systems  Constitutional: Negative for fever.  HENT: Negative for congestion, sore throat and neck pain.   Eyes: Negative.   Respiratory: Negative for chest tightness and shortness of breath.   Cardiovascular: Negative for chest pain.  Gastrointestinal: Negative for nausea and abdominal pain.  Genitourinary: Negative.   Musculoskeletal: Positive for joint swelling, arthralgias and gait problem.  Skin: Negative.  Negative for rash and wound.  Neurological: Negative for dizziness, weakness, light-headedness, numbness and headaches.  Hematological: Negative.   Psychiatric/Behavioral: Negative.     Allergies  Review of patient's allergies indicates no known allergies.  Home Medications   Current Outpatient Rx  Name Route Sig Dispense Refill  . ATENOLOL 25 MG PO TABS  TAKE ONE TABLET BY MOUTH EVERY DAY 30 tablet 6  . CETIRIZINE HCL 10 MG PO TABS Oral Take 10 mg by mouth daily.      Marland Kitchen  METHOCARBAMOL 750 MG PO TABS Oral Take 1 tablet (750 mg total) by mouth 3 (three) times daily. 90 tablet 1  . ONE-DAILY MULTI VITAMINS PO TABS Oral Take 1 tablet by mouth daily.      Azzie Roup ACE-ETH ESTRAD-FE 1-20 MG-MCG(24) PO TABS Oral Take by mouth daily.      Marland Kitchen OMEPRAZOLE 40 MG PO CPDR Oral Take 1 capsule (40 mg total) by mouth daily. 30 capsule 6  . OXYCODONE-ACETAMINOPHEN 5-325 MG PO TABS Oral Take 1 tablet by mouth every 4 (four) hours as needed for pain. 12 tablet 0  . OXYCODONE-ACETAMINOPHEN 5-325 MG PO TABS Oral Take 1 tablet by mouth every 4 (four) hours as needed for pain. 6 tablet 0  . PREGABALIN 100 MG PO CAPS Oral Take 1 capsule (100 mg total) by mouth 2 (two) times daily. 60 capsule 3  .  PROMETHAZINE HCL 12.5 MG PO TABS Oral Take 1 tablet (12.5 mg total) by mouth every 6 (six) hours as needed. 30 tablet 0  . SERTRALINE HCL 50 MG PO TABS Oral Take 1 tablet (50 mg total) by mouth daily. 30 tablet 3    BP 131/87  Pulse 81  Temp 97.7 F (36.5 C)  Resp 20  Ht 5\' 5"  (1.651 m)  Wt 210 lb (95.255 kg)  BMI 34.95 kg/m2  SpO2 100%  LMP 05/11/2010  Physical Exam  Nursing note and vitals reviewed. Constitutional: She is oriented to person, place, and time. She appears well-developed and well-nourished.  HENT:  Head: Normocephalic.  Eyes: Conjunctivae are normal.  Neck: Normal range of motion.  Cardiovascular: Normal rate and intact distal pulses.  Exam reveals no decreased pulses.   Pulses:      Dorsalis pedis pulses are 2+ on the right side.  Pulmonary/Chest: Effort normal.  Musculoskeletal: She exhibits edema and tenderness.       Right knee: She exhibits swelling and effusion. She exhibits no deformity, no erythema, no LCL laxity, normal patellar mobility, no bony tenderness and no MCL laxity. tenderness found. Medial joint line tenderness noted.       Right ankle: She exhibits decreased range of motion, swelling and ecchymosis. She exhibits normal pulse. tenderness. Lateral malleolus and CF ligament tenderness found. No head of 5th metatarsal and no proximal fibula tenderness found. Achilles tendon normal.  Neurological: She is alert and oriented to person, place, and time. No sensory deficit.  Skin: Skin is warm, dry and intact.    ED Course  Procedures (including critical care time)  Labs Reviewed - No data to display Dg Ankle Complete Right  05/28/2011  *RADIOLOGY REPORT*  Clinical Data: Ankle injury.  RIGHT ANKLE - COMPLETE 3+ VIEW  Comparison: None.  Findings: There is no fracture, dislocation, or joint effusion.  IMPRESSION: Normal exam.  Original Report Authenticated By: Gwynn Burly, M.D.   Dg Knee Complete 4 Views Right  05/28/2011  *RADIOLOGY REPORT*   Clinical Data: Right knee pain secondary to a fall.  RIGHT KNEE - COMPLETE 4+ VIEW  Comparison: 06/10/2010  Findings: There is no fracture or dislocation.  Moderate joint effusion.  No appreciable arthritic changes.  IMPRESSION: Moderate joint effusion.  No fracture.  Original Report Authenticated By: Gwynn Burly, M.D.     1. Knee effusion, right   2. Sprain of ankle, right       MDM  Patient to continue wearing a right knee immobilizer, was given ASO for right ankle.  Ice and elevation, Percocet, ibuprofen.  Followup  with Dr. Thurston Hole.        Candis Musa, PA 05/28/11 [phone]

## 2011-06-04 MED FILL — Oxycodone w/ Acetaminophen Tab 5-325 MG: ORAL | Qty: 6 | Status: AC

## 2011-07-23 ENCOUNTER — Other Ambulatory Visit: Payer: Self-pay | Admitting: Internal Medicine

## 2011-07-24 NOTE — Telephone Encounter (Signed)
Faxed script back to [facility]... 07/24/11@1 :31pm/LMB

## 2011-08-20 ENCOUNTER — Other Ambulatory Visit: Payer: Self-pay | Admitting: *Deleted

## 2011-08-20 MED ORDER — DICLOFENAC SODIUM 75 MG PO TBEC: 75.0000 mg | DELAYED_RELEASE_TABLET | Freq: Two times a day (BID) | ORAL | Status: DC

## 2011-08-29 ENCOUNTER — Other Ambulatory Visit: Payer: Self-pay | Admitting: Internal Medicine

## 2011-09-09 ENCOUNTER — Other Ambulatory Visit: Payer: Self-pay | Admitting: Cardiology

## 2011-09-16 ENCOUNTER — Encounter: Payer: Self-pay | Admitting: Internal Medicine

## 2011-09-16 ENCOUNTER — Ambulatory Visit (INDEPENDENT_AMBULATORY_CARE_PROVIDER_SITE_OTHER): Payer: Self-pay | Admitting: Internal Medicine

## 2011-09-16 VITALS — BP 110/82 | HR 106 | Temp 98.2°F | Ht 65.0 in | Wt 218.0 lb

## 2011-09-16 DIAGNOSIS — Q078 Other specified congenital malformations of nervous system: Secondary | ICD-10-CM

## 2011-09-16 DIAGNOSIS — S83209A Unspecified tear of unspecified meniscus, current injury, unspecified knee, initial encounter: Secondary | ICD-10-CM

## 2011-09-16 DIAGNOSIS — G43909 Migraine, unspecified, not intractable, without status migrainosus: Secondary | ICD-10-CM

## 2011-09-16 DIAGNOSIS — F411 Generalized anxiety disorder: Secondary | ICD-10-CM

## 2011-09-16 DIAGNOSIS — IMO0002 Reserved for concepts with insufficient information to code with codable children: Secondary | ICD-10-CM

## 2011-09-16 MED ORDER — ALPRAZOLAM 0.5 MG PO TABS: 0.5000 mg | ORAL_TABLET | Freq: Two times a day (BID) | ORAL | Status: DC

## 2011-09-16 MED ORDER — SERTRALINE HCL 50 MG PO TABS: 100.0000 mg | ORAL_TABLET | Freq: Every day | ORAL | Status: DC

## 2011-09-16 MED ORDER — PREGABALIN 100 MG PO CAPS: 100.0000 mg | ORAL_CAPSULE | Freq: Two times a day (BID) | ORAL | Status: DC

## 2011-09-16 MED ORDER — GABAPENTIN 100 MG PO CAPS: 100.0000 mg | ORAL_CAPSULE | Freq: Three times a day (TID) | ORAL | Status: DC

## 2011-09-16 NOTE — Assessment & Plan Note (Signed)
POTS - pending disability due to same Med mgmt per cards - symptoms appear generally stable at this time

## 2011-09-16 NOTE — Assessment & Plan Note (Signed)
Weaned off paxil 12/12, started sertraline -titrate up now Also xanax to use prn Support offered -

## 2011-09-16 NOTE — Progress Notes (Signed)
Subjective:    Patient ID: Kayla Floyd, female    DOB: 07/06/1980, 31 y.o.   MRN: [id number]  HPI  Here for increase in anxiety symptoms   Also reviewed chronic med issues:  R ACL tear 05/2010 - continued right knee pain. Unable to surgical repair due to lack of insurance 05/2010 Injury precipitated by descending stairs - no twisting - no fall   depression and anxiety  - continued overwhelming fatigue and excess sleep patterns -  tol generic paxil - no adv side effects, 100% compliance - worse with new stressors (mom living with pt/family)  PCOS - was on fertility tx for same prior to syncope spells and dx of POTS now on BCP to help control bleeding also on metformin - no adv SE will cont to follow with gyn for same -  POTS - on low dose atenolol as intol of lopressor and diltiazem- follows with cards annually for eval and tx of same -  recommended by cards to remain out of work in 2011- letter by cards to insurance reviewed  cont orthosatic dizzy symptoms, only showers if someone in house to supervise - no further syncope events not driving  spine DDD - s/p cervical surg for same as well as lumbar laminectomy takes daily NSAIDs and lyrica for control of pain + muscle relaxants  dyslipidemia - related to PCOS and weight gain never on meds for same dx  Past Medical History  Diagnosis Date  . POLYCYSTIC OVARIAN DISEASE   . HYPERLIPIDEMIA   . MIGRAINE HEADACHE   . GERD   . BACK PAIN, CHRONIC   . Palpitations   . DYSAUTONOMIA 2011    "POTS" , recurrent syncope - follows with cards for same  . ANXIETY   . Tear meniscus knee 06/14/2010    right  . POTS (postural orthostatic tachycardia syndrome)     Review of Systems  Constitutional: Negative for fever and fatigue.  Respiratory: Negative for cough and shortness of breath.   Neurological: Positive for headaches.       Objective:   Physical Exam  BP 110/82  Pulse 106  Temp 98.2 F (36.8 C) (Oral)   Ht 5\' 5"  (1.651 m)  Wt 218 lb (98.884 kg)  BMI 36.28 kg/m2  SpO2 96% Wt Readings from Last 3 Encounters:  09/16/11 218 lb (98.884 kg)  05/27/11 210 lb (95.255 kg)  04/23/11 210 lb (95.255 kg)   Constitutional: She is overweight -appears well-developed and well-nourished. No distress. spouse at side Neck: Normal range of motion. Neck supple. No JVD present. No thyromegaly present.  Cardiovascular: Normal rate, regular rhythm and normal heart sounds.  No murmur heard. No BLE edema. Pulmonary/Chest: Effort normal and breath sounds normal. No respiratory distress. She has no wheezes.  Neurological: She is alert and oriented to person, place, and time. No cranial nerve deficit. Coordination normal.  Psychiatric: She has a normal mood and affect. Her behavior is normal. Judgment and thought content normal.     Lab Results  Component Value Date   WBC 10.6* 04/23/2011   HGB 13.1 04/23/2011   HCT 36.7 04/23/2011   PLT 315 04/23/2011   GLUCOSE 94 04/23/2011   CHOL 232 06/06/2008   HDL 28 06/06/2008   LDLCALC 128 06/06/2008   ALT 62* 09/02/2010   AST 44* 09/02/2010   NA 139 04/23/2011   K 3.6 04/23/2011   CL 97 04/23/2011   CREATININE 0.66 04/23/2011   BUN 8 04/23/2011   CO2 29  04/23/2011   TSH 0.84 06/06/2010        Assessment & Plan:

## 2011-09-16 NOTE — Patient Instructions (Signed)
It was good to see you today. Increase dose Zoloft as discussed and start gabapentin - also continue to use xanax as needed -  Other medications reviewed, no other changes at this time. Your prescription(s) have been given to you to submit to your pharmacy. Please take as directed and contact our office if you believe you are having problem(s) with the medication(s). Good luck with your planned move to [place] Please schedule followup in 6 months, call sooner if problems.

## 2012-04-03 ENCOUNTER — Other Ambulatory Visit: Payer: Self-pay | Admitting: Internal Medicine

## 2012-04-03 NOTE — Telephone Encounter (Signed)
Faxed script back to [facility] in [place]...lmb

## 2013-02-16 ENCOUNTER — Ambulatory Visit (INDEPENDENT_AMBULATORY_CARE_PROVIDER_SITE_OTHER): Payer: Medicare Other | Admitting: Nurse Practitioner

## 2013-02-16 ENCOUNTER — Encounter: Payer: Self-pay | Admitting: Nurse Practitioner

## 2013-02-16 ENCOUNTER — Encounter (INDEPENDENT_AMBULATORY_CARE_PROVIDER_SITE_OTHER): Payer: Medicare Other

## 2013-02-16 VITALS — BP 130/70 | HR 88 | Ht 65.0 in | Wt 219.8 lb

## 2013-02-16 DIAGNOSIS — I951 Orthostatic hypotension: Secondary | ICD-10-CM

## 2013-02-16 DIAGNOSIS — R000 Tachycardia, unspecified: Secondary | ICD-10-CM

## 2013-02-16 DIAGNOSIS — R55 Syncope and collapse: Secondary | ICD-10-CM

## 2013-02-16 DIAGNOSIS — F411 Generalized anxiety disorder: Secondary | ICD-10-CM

## 2013-02-16 DIAGNOSIS — I498 Other specified cardiac arrhythmias: Secondary | ICD-10-CM

## 2013-02-16 NOTE — Progress Notes (Signed)
Kayla Floyd Date of Birth: 03-02-1981 Medical Record #[id number]  History of Present Illness: Ms. Swilling is seen today for a work in visit. Seen for Dr. Antoine Poche. She has not been here since May of 2012. She has POTS. Other issues include polycystic ovarian disease, HLD, migraines, HTN, GERD, chronic back pain, palpitations, anxiety, UTI and dysautonomia. She has had several orthopedic surgeries. Looks like she had her cardiac work up in 2011 with tilt table and echo. Not clear as to whether she has seen Dr. Graciela Husbands. Has been maintained on chronic beta blocker therapy as well as SSRI therapy.   Comes in today. Previously getting her care in [place].   Called earlier this week and wanted to be seen. Thus added to my schedule.   Here with her husband. She feels like she is back sliding. Now having more palpitations. Feels her heart beating fast as well. Says it is 2 separate feelings. Has had 15 to 20 presncopal spells over the past month - 3 syncopal spells as well. Not every day. She usually knows when it is going to happen but now with less warning. No injury. No real caffeine use. Does not quantify her use of alcohol. No regular exercise. Tries to increase her salt but then has swelling and BP goes up. Says support stockings don't work. Now back in [place] and will be reestablishing her care here.   Current Outpatient Prescriptions  Medication Sig Dispense Refill  . ALPRAZolam (XANAX) 0.5 MG tablet Take 1 tablet (0.5 mg total) by mouth 2 (two) times daily.  60 tablet  3  . atenolol (TENORMIN) 25 MG tablet take one tablet twice a day      . atorvastatin (LIPITOR) 10 MG tablet Take 10 mg by mouth daily.      . baclofen (LIORESAL) 10 MG tablet Take 10 mg by mouth 3 (three) times daily as needed for muscle spasms.      . cetirizine (ZYRTEC) 10 MG tablet Take 10 mg by mouth daily.        . diclofenac (VOLTAREN) 75 MG EC tablet Take 1 tablet (75 mg total) by mouth 2 (two) times  daily.  60 tablet  1  . fluticasone (FLONASE) 50 MCG/ACT nasal spray Place 1 spray into both nostrils as needed for allergies or rhinitis.      Marland Kitchen gabapentin (NEURONTIN) 100 MG capsule Take 100 mg by mouth as needed.      Marland Kitchen ketorolac (TORADOL) 30 MG/ML injection Inject 30 mg into the muscle as needed for pain.      Marland Kitchen LYRICA 100 MG capsule TAKE ONE CAPSULE BY MOUTH TWICE DAILY  60 capsule  1  . methocarbamol (ROBAXIN) 750 MG tablet TAKE ONE TABLET BY MOUTH THREE TIMES DAILY  90 tablet  3  . mirtazapine (REMERON) 45 MG tablet Take 45 mg by mouth at bedtime.      . Multiple Vitamin (MULTIVITAMIN) tablet Take 1 tablet by mouth daily.        Marland Kitchen omeprazole (PRILOSEC) 40 MG capsule Take 1 capsule (40 mg total) by mouth daily.  30 capsule  6  . OnabotulinumtoxinA (BOTOX IJ) Inject as directed. Every 12 weeks      . oxyCODONE-acetaminophen (PERCOCET) 10-325 MG per tablet Take 1 tablet by mouth as needed for pain. 1 to 1/2 tablet prn      . promethazine (PHENERGAN) 12.5 MG tablet Take 1 tablet (12.5 mg total) by mouth every 6 (six) hours as needed.  30  tablet  0  . sertraline (ZOLOFT) 50 MG tablet Take 2 tablets (100 mg total) by mouth daily.  60 tablet  3  . sodium chloride 0.9 % Inject into the vein as needed.      . Topiramate (TOPAMAX PO) Take 75 mg by mouth 2 (two) times daily.      . Norethin Ace-Eth Estrad-FE (LOESTRIN 24 FE) 1-20 MG-MCG(24) TABS Take by mouth daily.         No current facility-administered medications for this visit.    Allergies  Allergen Reactions  . Beef-Derived Products     anaphylaxes   . Glycerol, Iodinated     anaphylaxes   . Pork-Derived Products     anaplaxis    Past Medical History  Diagnosis Date  . POLYCYSTIC OVARIAN DISEASE   . HYPERLIPIDEMIA   . MIGRAINE HEADACHE   . GERD   . BACK PAIN, CHRONIC   . Palpitations   . DYSAUTONOMIA 2011    "POTS" , recurrent syncope - follows with cards for same  . ANXIETY   . Tear meniscus knee 06/14/2010    right    . POTS (postural orthostatic tachycardia syndrome)     Past Surgical History  Procedure Laterality Date  . Cervical spine  2010    ACDF c5-6  . Tonsillectomy and adenoidectomy    . Lumbar laminectomy  2008    History  Smoking status  . Former Smoker -- 1.00 packs/day for 10 years  . Quit date: 03/25/2005  Smokeless tobacco  . Not on file    Comment: She had hx of smokiong about a pack week for about 10 years    History  Alcohol Use  . Yes    Comment: occasionally    Family History  Problem Relation Age of Onset  . Colon cancer Maternal Grandmother 70    Review of Systems: The review of systems is per the HPI.  All other systems were reviewed and are negative.  Physical Exam: BP 130/70  Pulse 88  Ht 5\' 5"  (1.651 m)  Wt 219 lb 12.8 oz (99.701 kg)  BMI 36.58 kg/m2  SpO2 98% Sitting and standing BP is 120/90 by me and HR remains 80 and regular.  Patient is alert and in no acute distress. She is obese. Skin is warm and dry. Color is normal.  HEENT is unremarkable. Normocephalic/atraumatic. PERRL. Sclera are nonicteric. Neck is supple. No masses. No JVD. Lungs are clear. Cardiac exam shows a regular rate and rhythm. Abdomen is soft. Extremities are without edema. Gait and ROM are intact. No gross neurologic deficits noted.  LABORATORY DATA: EKG shows sinus rhythm.   Lab Results  Component Value Date   WBC 10.6* 04/23/2011   HGB 13.1 04/23/2011   HCT 36.7 04/23/2011   PLT 315 04/23/2011   GLUCOSE 94 04/23/2011   CHOL 232 06/06/2008   HDL 28 06/06/2008   LDLCALC 128 06/06/2008   ALT 62* 09/02/2010   AST 44* 09/02/2010   NA 139 04/23/2011   K 3.6 04/23/2011   CL 97 04/23/2011   CREATININE 0.66 04/23/2011   BUN 8 04/23/2011   CO2 29 04/23/2011   TSH 0.84 06/06/2010     Assessment / Plan: 1. POTS - with reports of recurrent presyncopal/syncopal spells - I really do not have much to offer her -  I have talked with Dr. Graciela Husbands who was so kind to come and see the patient with me  - he reviewed her Tilt and  says that it really was not that impressive. He is agreeable to my plan of placing an event monitor. He will see her back for discussion.   2. Obesity  3. Elevated LFTs  Patient is agreeable to this plan and will call if any problems develop in the interim.   Rosalio Macadamia, RN, ANP-C [facility] [address] [place], [place]  [address]

## 2013-02-16 NOTE — Patient Instructions (Addendum)
Please stay on your current medicines for now  We are going to put on an event monitor for next month  We will get you a visit with Dr. Graciela Husbands  Call the [facility] office at [phone] if you have any questions, problems or concerns.

## 2013-03-08 IMAGING — CR DG KNEE COMPLETE 4+V*R*
4 series · 4 of 4 positions shown · non-contrast
Comparison: None.

CLINICAL DATA: Right knee pain for 2 days.  No injury.

RIGHT KNEE - COMPLETE 4+ VIEW

[view not recorded (1 of 4)]
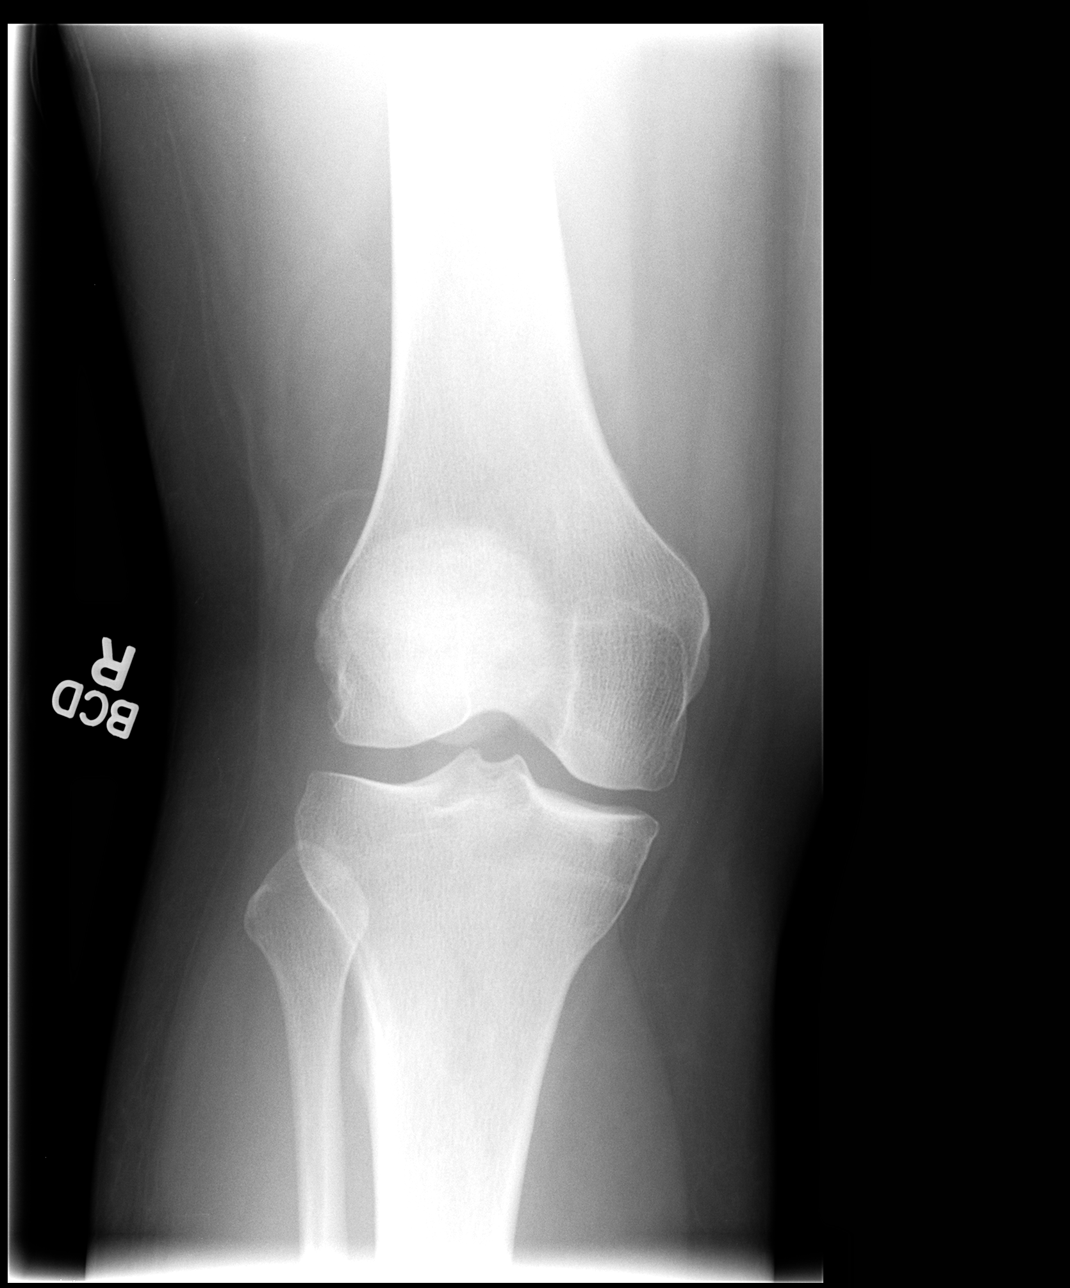

[view not recorded (2 of 4)]
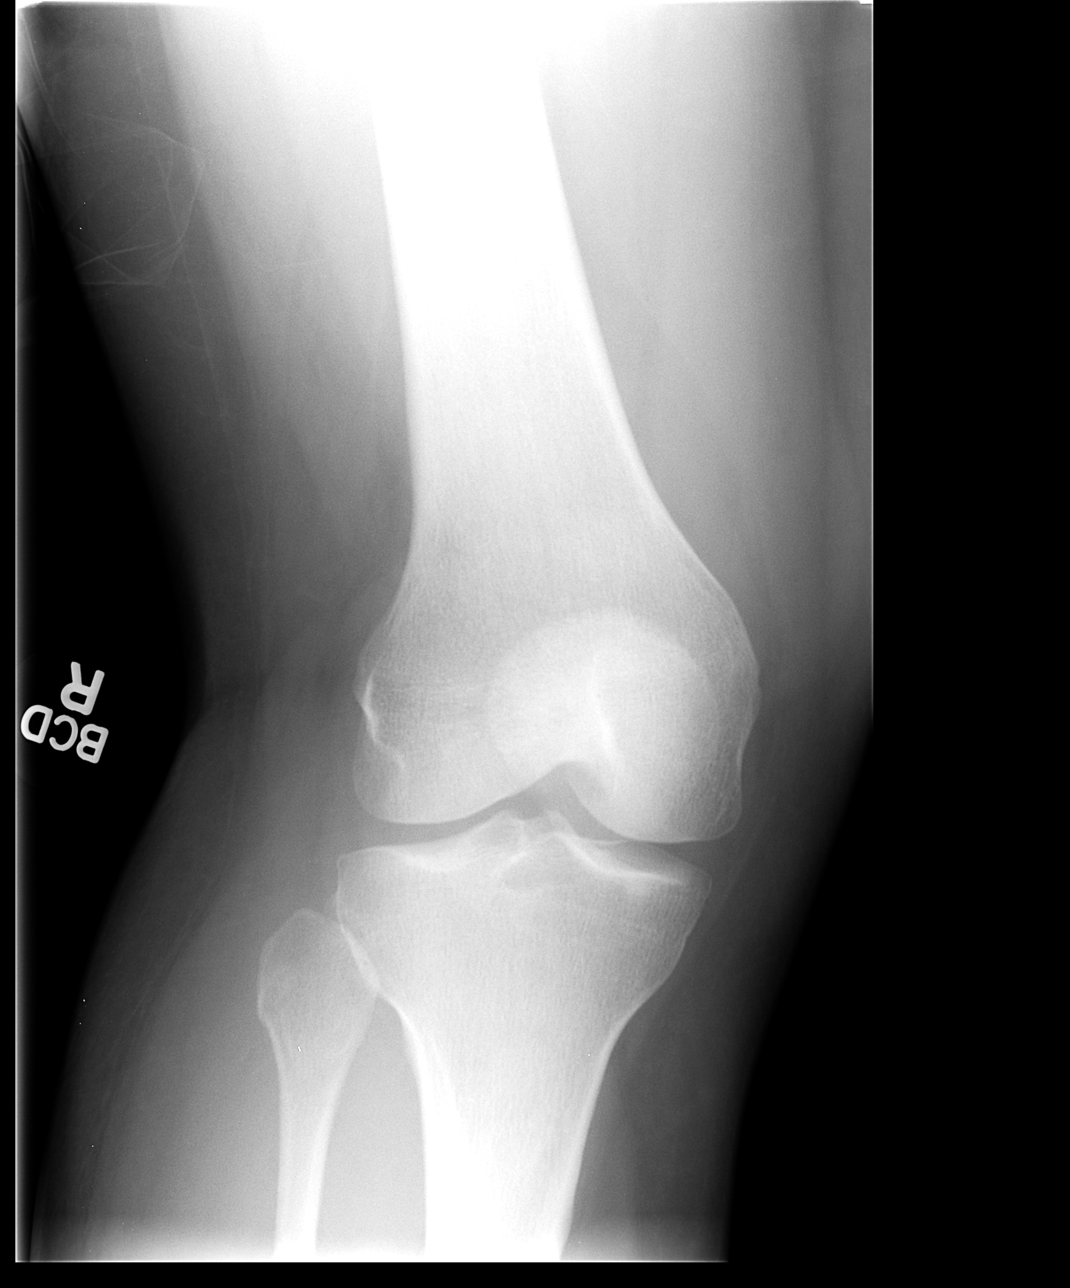

[view not recorded (3 of 4)]
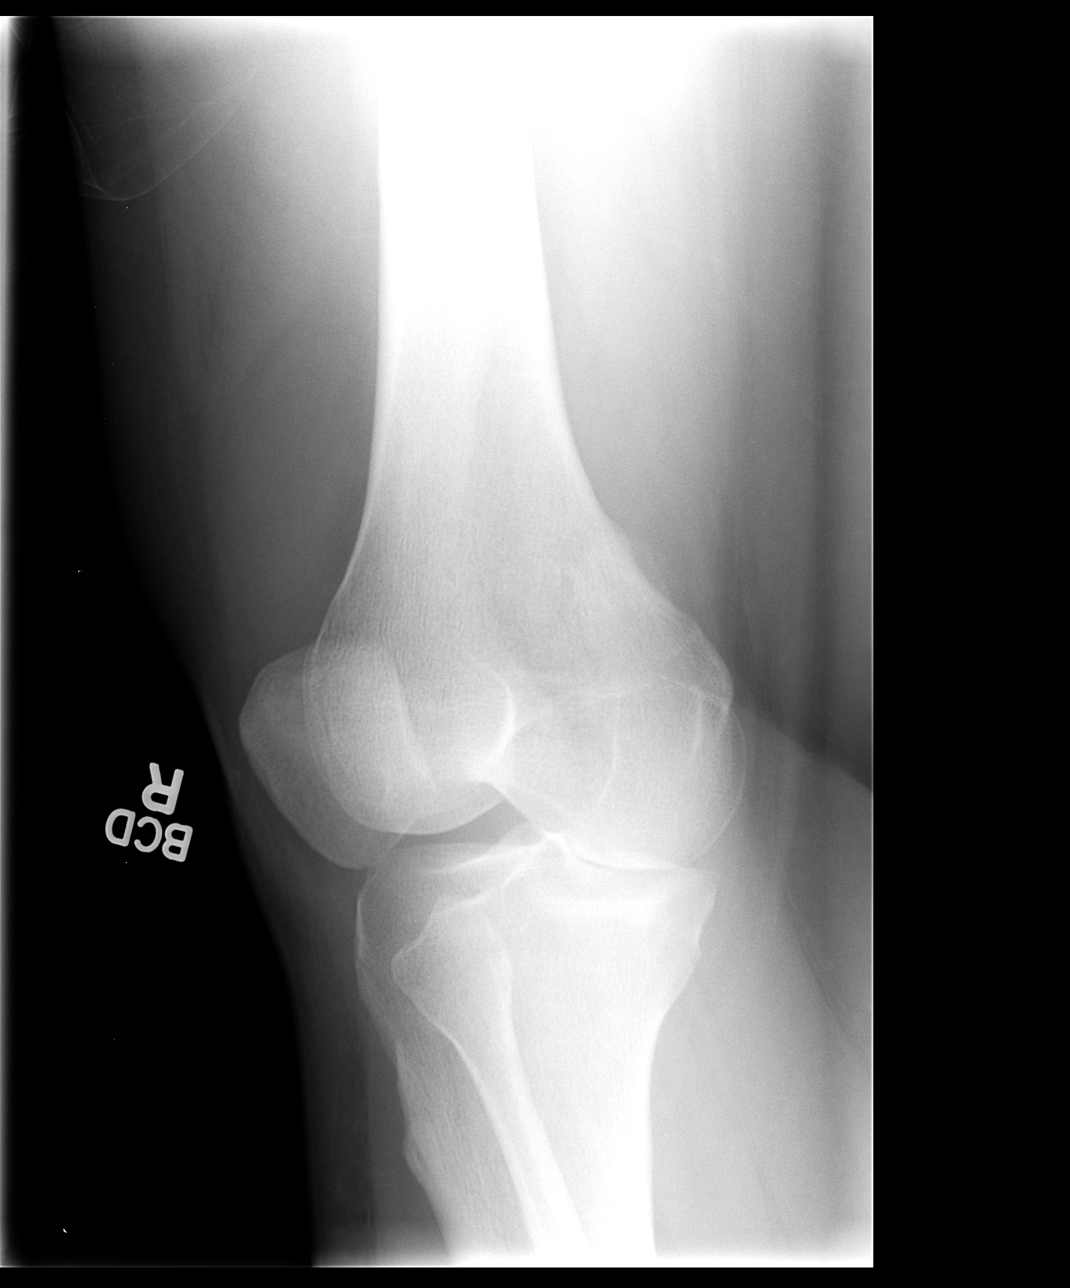

[view not recorded (4 of 4)]
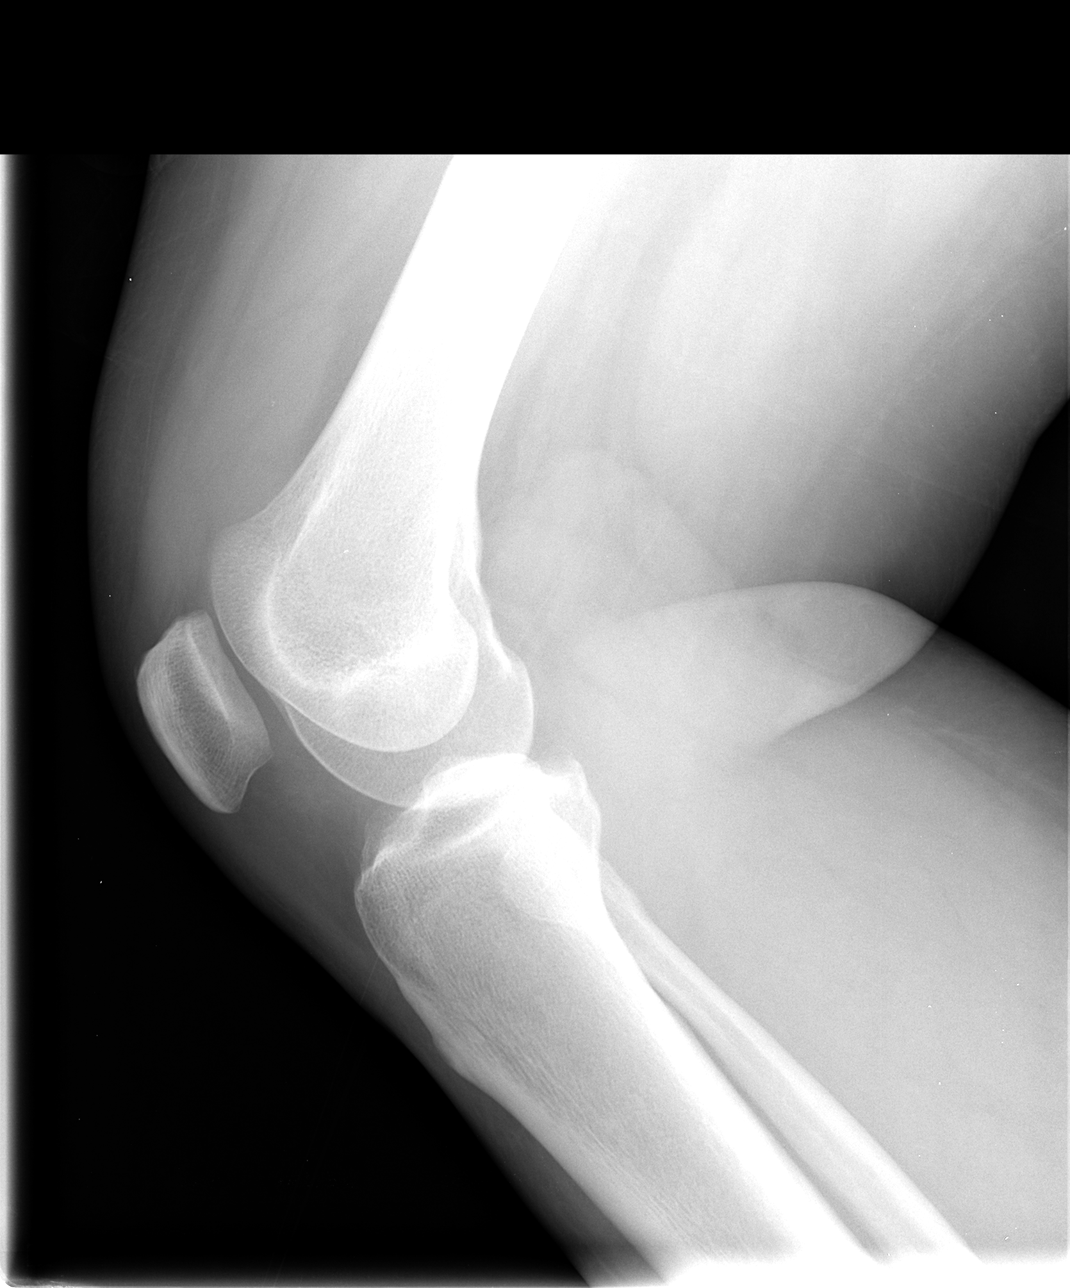

[4 of 4 positions shown; findings below may reference images not displayed]

FINDINGS: Moderate sized effusion.  Slight subcortical sclerosis in
the medial tibial plateau, likely benign.  No focal bone loss or
erosion.  Osteochondral lesion is not excluded.  No evidence of
acute fracture or subluxation.
IMPRESSION: Right knee effusion.  Small focal area of subcortical sclerosis in
the medial tibial plateau.  No evidence of acute fracture.

## 2013-03-26 ENCOUNTER — Ambulatory Visit (INDEPENDENT_AMBULATORY_CARE_PROVIDER_SITE_OTHER): Payer: Medicare Other | Admitting: Internal Medicine

## 2013-03-26 ENCOUNTER — Encounter: Payer: Self-pay | Admitting: Internal Medicine

## 2013-03-26 VITALS — BP 106/74 | HR 84 | Ht 65.0 in | Wt 216.1 lb

## 2013-03-26 DIAGNOSIS — I951 Orthostatic hypotension: Secondary | ICD-10-CM

## 2013-03-26 DIAGNOSIS — G90A Postural orthostatic tachycardia syndrome (POTS): Secondary | ICD-10-CM

## 2013-03-26 DIAGNOSIS — R000 Tachycardia, unspecified: Secondary | ICD-10-CM

## 2013-03-26 MED ORDER — ATENOLOL 25 MG PO TABS: 25.0000 mg | ORAL_TABLET | Freq: Every day | ORAL | Status: DC

## 2013-03-26 NOTE — Progress Notes (Signed)
Patient Care Team: Newt Lukes, MD as PCP - General Rollene Rotunda, MD (Cardiology) Eulas Post, MD (Orthopedic Surgery)   HPI  Kayla Floyd is a 33 y.o. female  The patient has a history of symptoms of orthostatic intolerance, orthostatic and exertional palpitations and more recently syncope that occurred in the context of a tilt table test performed in 2011 at the request of Dr. [facility] which demonstrated a change in heart rate from 75--115 with a presumptive diagnosis of orthostatic tachycardia syndrome. Atenolol was started and she was much improved. She's having increasing symptoms of syncope now without warning  Her event recorder showed no arrhytmia with syncope Recurrent symptoms of palpitations assoc with NSR   Also with dizziness  Depression remains a big problem   Past Medical History  Diagnosis Date  . POLYCYSTIC OVARIAN DISEASE   . HYPERLIPIDEMIA   . MIGRAINE HEADACHE   . GERD   . BACK PAIN, CHRONIC   . Palpitations   . DYSAUTONOMIA 2011    "POTS" , recurrent syncope - follows with cards for same  . ANXIETY   . Tear meniscus knee 06/14/2010    right  . POTS (postural orthostatic tachycardia syndrome)     Past Surgical History  Procedure Laterality Date  . Cervical spine  2010    ACDF c5-6  . Tonsillectomy and adenoidectomy    . Lumbar laminectomy  2008    Current Outpatient Prescriptions  Medication Sig Dispense Refill  . ALPRAZolam (XANAX) 0.5 MG tablet Take 1 tablet (0.5 mg total) by mouth 2 (two) times daily.  60 tablet  3  . atenolol (TENORMIN) 25 MG tablet take one tablet twice a day      . atorvastatin (LIPITOR) 10 MG tablet Take 10 mg by mouth daily.      . baclofen (LIORESAL) 10 MG tablet Take 10 mg by mouth 3 (three) times daily as needed for muscle spasms.      . cetirizine (ZYRTEC) 10 MG tablet Take 10 mg by mouth daily.        . diclofenac (VOLTAREN) 75 MG EC tablet Take 1 tablet (75 mg total) by mouth 2 (two)  times daily.  60 tablet  1  . fluticasone (FLONASE) 50 MCG/ACT nasal spray Place 1 spray into both nostrils as needed for allergies or rhinitis.      Marland Kitchen ketorolac (TORADOL) 30 MG/ML injection Inject 30 mg into the muscle as needed for pain.      Marland Kitchen LYRICA 100 MG capsule TAKE ONE CAPSULE BY MOUTH TWICE DAILY  60 capsule  1  . methocarbamol (ROBAXIN) 750 MG tablet TAKE ONE TABLET BY MOUTH THREE TIMES DAILY  90 tablet  3  . mirtazapine (REMERON) 45 MG tablet Take 45 mg by mouth at bedtime.      . Multiple Vitamin (MULTIVITAMIN) tablet Take 1 tablet by mouth daily.        . OnabotulinumtoxinA (BOTOX IJ) Inject as directed. Every 12 weeks      . oxyCODONE-acetaminophen (PERCOCET) 10-325 MG per tablet Take 1 tablet by mouth as needed for pain. 1 to 1/2 tablet prn      . promethazine (PHENERGAN) 12.5 MG tablet Take 1 tablet (12.5 mg total) by mouth every 6 (six) hours as needed.  30 tablet  0  . sertraline (ZOLOFT) 50 MG tablet Take 2 tablets (100 mg total) by mouth daily.  60 tablet  3  . sodium chloride 0.9 % Inject into  the vein as needed.      . Topiramate (TOPAMAX PO) Take 75 mg by mouth 2 (two) times daily.      . Norethin Ace-Eth Estrad-FE (LOESTRIN 24 FE) 1-20 MG-MCG(24) TABS Take by mouth daily.        Marland Kitchen. omeprazole (PRILOSEC) 40 MG capsule Take 1 capsule (40 mg total) by mouth daily.  30 capsule  6   No current facility-administered medications for this visit.    Allergies  Allergen Reactions  . Beef-Derived Products     anaphylaxes   . Glycerol, Iodinated     anaphylaxes   . Pork-Derived Products     anaplaxis    Review of Systems negative except from HPI and PMH  Physical Exam BP 106/74  Pulse 84  Ht 5\' 5"  (1.651 m)  Wt 216 lb 1.9 oz (98.031 kg)  BMI 35.96 kg/m2 Well developed and nourished in no acute distress HENT normal Neck supple with JVP-flat Carotids brisk and full without bruits Clear Regular rate and rhythm, no murmurs or gallops Abd-soft with active BS without  hepatomegaly No Clubbing cyanosis edema Skin-warm and dry A & Oriented  Grossly normal sensory and motor function  ECG  Sinus 76  15/08/40  Assessment and  Plan

## 2013-03-26 NOTE — Patient Instructions (Signed)
Your physician has recommended you make the following change in your medication:  1) Decrease Atenolol to 25mg  once daily  Prescription given for waist high compression stockings  Make sure to exercise  Intake plenty of fluids  Your physician recommends that you schedule a follow-up appointment in: 3 months with Dr. Graciela HusbandsKlein.   I will contact home care to see about possibility of home IV fluids, and will let you know when I have an answer. (Please call back if you have not heard from us by end of next week about this)  Dory HornSherri Arlie Posch, RN   [phone]

## 2013-03-26 NOTE — Assessment & Plan Note (Signed)
.   We will decrease her atenolol from 25 twice a day to once a day.  We have reviewed the importance of aerobic activity with exercise and also describing his isometric contractions prior to standing to try to her orthostatic syncope. She has not been tolerant of salt because of edema; we have discussed the potential use of ProAmatine but given her. Medications we will hold off on that for now. I will give her a prescription for 20-30 mm support stockings.  She asks about home IV fluids. We will check was advanced home care to see if supplies can be prescribed.   I've also encouraged her to continue to work on mental health and the secondary depression that has been part of her symptoms.

## 2013-04-07 LAB — HEPATIC FUNCTION PANEL
ALT: 36 U/L — AB (ref 7–35)
AST: 49 U/L — AB (ref 13–35)

## 2013-04-07 LAB — LIPID PANEL
Cholesterol: 199 mg/dL (ref 0–200)
HDL: 29 mg/dL — AB (ref 35–70)
LDL Cholesterol: 92 mg/dL
TRIGLYCERIDES: 389 mg/dL — AB (ref 40–160)

## 2013-04-07 LAB — BASIC METABOLIC PANEL
BUN: 12 mg/dL (ref 4–21)
Creatinine: 0.8 mg/dL (ref 0.5–1.1)
GLUCOSE: 113 mg/dL
Potassium: 4.1 mmol/L (ref 3.4–5.3)
Sodium: 145 mmol/L (ref 137–147)

## 2013-04-07 LAB — HEMOGLOBIN A1C: Hgb A1c MFr Bld: 5.6 % (ref 4.0–6.0)

## 2013-05-14 ENCOUNTER — Telehealth: Payer: Self-pay | Admitting: *Deleted

## 2013-05-14 NOTE — Telephone Encounter (Signed)
Calling patient to inform her that we cannot get home health IVF bags for home prn fluids. Voicemail would not let me leave message.

## 2013-06-14 NOTE — Telephone Encounter (Signed)
Left message on personal voicemail that we she is unable to obtain IVF bags through home health.

## 2014-01-07 ENCOUNTER — Other Ambulatory Visit: Payer: Self-pay

## 2014-03-14 ENCOUNTER — Telehealth: Payer: Self-pay | Admitting: Internal Medicine

## 2014-03-14 NOTE — Telephone Encounter (Signed)
I trust any of my partners to manage her condition(s) as needed That said, if she wants to see me, we will try to accommodate - but do not add on Please let her know that I am part time and do not have same availability as when she last saw me - happy to transfer care to new PCP if pt prefers - thanks!

## 2014-03-14 NOTE — Telephone Encounter (Signed)
Patient had appointment scheduled for the end of month. I called to reschedule. Patient states she has waited several months to get in with Dr. Felicity CoyerLeschber.  She does not think another provider is going to be able to handle her condition.  She is requesting a date to be worked in.

## 2014-03-15 NOTE — Telephone Encounter (Signed)
lvm for pt to call back.  

## 2014-03-24 ENCOUNTER — Ambulatory Visit: Payer: Medicare Other | Admitting: Internal Medicine

## 2014-03-29 ENCOUNTER — Other Ambulatory Visit (INDEPENDENT_AMBULATORY_CARE_PROVIDER_SITE_OTHER): Payer: Medicare Other

## 2014-03-29 ENCOUNTER — Ambulatory Visit (INDEPENDENT_AMBULATORY_CARE_PROVIDER_SITE_OTHER): Payer: Medicare Other | Admitting: Internal Medicine

## 2014-03-29 ENCOUNTER — Encounter: Payer: Self-pay | Admitting: Internal Medicine

## 2014-03-29 ENCOUNTER — Telehealth: Payer: Self-pay | Admitting: Internal Medicine

## 2014-03-29 VITALS — BP 126/90 | HR 93 | Temp 98.0°F | Ht 65.0 in | Wt 216.2 lb

## 2014-03-29 DIAGNOSIS — G901 Familial dysautonomia [Riley-Day]: Secondary | ICD-10-CM

## 2014-03-29 DIAGNOSIS — M549 Dorsalgia, unspecified: Secondary | ICD-10-CM

## 2014-03-29 DIAGNOSIS — G909 Disorder of the autonomic nervous system, unspecified: Secondary | ICD-10-CM

## 2014-03-29 DIAGNOSIS — E282 Polycystic ovarian syndrome: Secondary | ICD-10-CM

## 2014-03-29 DIAGNOSIS — R34 Anuria and oliguria: Secondary | ICD-10-CM

## 2014-03-29 DIAGNOSIS — G43909 Migraine, unspecified, not intractable, without status migrainosus: Secondary | ICD-10-CM

## 2014-03-29 DIAGNOSIS — G8929 Other chronic pain: Secondary | ICD-10-CM

## 2014-03-29 LAB — URINALYSIS, ROUTINE W REFLEX MICROSCOPIC
BILIRUBIN URINE: NEGATIVE
Ketones, ur: NEGATIVE
LEUKOCYTES UA: NEGATIVE
NITRITE: POSITIVE — AB
Specific Gravity, Urine: 1.025 (ref 1.000–1.030)
TOTAL PROTEIN, URINE-UPE24: NEGATIVE
UROBILINOGEN UA: 0.2 (ref 0.0–1.0)
Urine Glucose: NEGATIVE
pH: 6 (ref 5.0–8.0)

## 2014-03-29 LAB — BASIC METABOLIC PANEL
BUN: 10 mg/dL (ref 6–23)
CHLORIDE: 111 meq/L (ref 96–112)
CO2: 19 meq/L (ref 19–32)
CREATININE: 0.7 mg/dL (ref 0.4–1.2)
Calcium: 8.9 mg/dL (ref 8.4–10.5)
GFR: 100.02 mL/min (ref >60.00)
Glucose, Bld: 108 mg/dL — ABNORMAL HIGH (ref 70–99)
Potassium: 3.7 mEq/L (ref 3.5–5.1)
SODIUM: 137 meq/L (ref 135–145)

## 2014-03-29 MED ORDER — OXYCODONE-ACETAMINOPHEN 10-325 MG PO TABS: ORAL_TABLET | ORAL | Status: DC

## 2014-03-29 MED ORDER — SERTRALINE HCL 50 MG PO TABS: 100.0000 mg | ORAL_TABLET | Freq: Every day | ORAL | Status: DC

## 2014-03-29 MED ORDER — OXYCODONE-ACETAMINOPHEN 10-325 MG PO TABS: 1.0000 | ORAL_TABLET | ORAL | Status: DC | PRN

## 2014-03-29 MED ORDER — MIRTAZAPINE 45 MG PO TABS: 45.0000 mg | ORAL_TABLET | Freq: Every day | ORAL | Status: DC

## 2014-03-29 NOTE — Progress Notes (Signed)
Pre visit review using our clinic review tool, if applicable. No additional management support is needed unless otherwise documented below in the visit note. 

## 2014-03-29 NOTE — Telephone Encounter (Signed)
[facility] called regarding medication, directions two sets neither make sense  -- need frequency clarified? Arlys JohnBrian [phone], pharmacist. Pt at pharmacy waiting for percocet script

## 2014-03-29 NOTE — Assessment & Plan Note (Signed)
Check BMET & UA because of oliguria

## 2014-03-29 NOTE — Assessment & Plan Note (Signed)
Discuss with Dr Felicity CoyerLeschber if she will assume management of chronic back syndrome

## 2014-03-29 NOTE — Telephone Encounter (Signed)
I called and spoke to [facility] to let them know directions for patient's Percocet 10-325 mg is 1/2 - 1 tablet by mouth q 6 h as needed.

## 2014-03-29 NOTE — Patient Instructions (Signed)
Results of your pending labs will be transmitted to you through My Chart

## 2014-03-29 NOTE — Progress Notes (Signed)
   Subjective:    Patient ID: Traci SermonBrandi M Nieves-Burnette, female    DOB: 05-Nov-1980, 34 y.o.   MRN: [id number]  HPI  Her medical history is extremely complex. She has multiple concerns; most serious of which is dysautonomia.  She also has migraines and receives Topamax from the [facility] as well as Botox injections every 12 weeks. She states that this has resulted in dramatic improvement in her quality of life  She had been followed by Dr. Felicity Coyer[place]eschber [place] A dysautonomia specialist there started her on Remeron. [place].  She also been seen in a pain clinic in [place] but was displeased with her interactions there. She stated she was released after she was 5 minutes late. 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 These also include spinal stenosis &  polycystic ovarian disease.  Review of Systems  For the dysautonomia she takes fluids "as needed". She felt she did become dehydrated 2 months ago as her urine was very concentrated and she was oliguric.  That has improved but she continues to have small amounts of urine.     Objective:   Physical Exam  General appearance :adequately nourished; in no distress. As per CDC Guidelines ,Epic documents obesity as being present .  Eyes: No conjunctival inflammation or scleral icterus is present.  Oral exam: Dental hygiene is good. Lips and gums are healthy appearing.There is no oropharyngeal erythema or exudate noted.   Heart:  Normal rate and regular rhythm. S1 and S2 normal without gallop, murmur, click, rub or other extra sounds     Lungs:Chest clear to  auscultation; no wheezes, rhonchi,rales ,or rubs present.No increased work of breathing.   Abdomen: bowel sounds normal, soft and non-tender without masses, organomegaly or hernias noted.  No guarding or rebound. No flank tenderness to percussion.  Vascular : all pulses equal ; no bruits present.  Skin:Warm & dry.  Intact without suspicious lesions or rashes ; no jaundice or tenting  Lymphatic: No lymphadenopathy is noted about the head, neck, axilla         Assessment & Plan:  See Current Assessment & Plan in Problem List under specific Diagnosis

## 2014-03-30 ENCOUNTER — Telehealth: Payer: Self-pay

## 2014-03-30 ENCOUNTER — Other Ambulatory Visit: Payer: Medicare Other

## 2014-03-30 DIAGNOSIS — R8290 Unspecified abnormal findings in urine: Secondary | ICD-10-CM

## 2014-03-30 NOTE — Telephone Encounter (Signed)
-----   Message from Pecola LawlessWilliam F Hopper, MD sent at 03/30/2014  6:26 AM EST ----- Please order C&S on urine

## 2014-03-30 NOTE — Telephone Encounter (Signed)
LVM for pt to call back per MD (Dr. Felicity CoyerLeschber).    RE: staff message below -   Thanks for the note Hopp, and for seeing this former pt of mine-   No, i will not manage her chronic pain rx (specifically, controlled substance)  Will recommended she find a new PCP as I am not working full time in [employer], [employer]   Stef - please call pt to let her know same  If pt wishes refer to new pain mgmt, will make refer for same.  I can remain PCP for routine health needs if desired, but not pain mgmt - nor can partners of mine be expected to write these rx unless she establishes with them as her PCP   Thanks!  VAL

## 2014-03-30 NOTE — Telephone Encounter (Signed)
Request for add on has been faxed to lab 

## 2014-04-02 ENCOUNTER — Encounter: Payer: Self-pay | Admitting: Internal Medicine

## 2014-04-02 ENCOUNTER — Other Ambulatory Visit: Payer: Self-pay | Admitting: Internal Medicine

## 2014-04-02 DIAGNOSIS — N390 Urinary tract infection, site not specified: Secondary | ICD-10-CM

## 2014-04-02 LAB — URINE CULTURE: Colony Count: >=100000

## 2014-04-02 MED ORDER — SULFAMETHOXAZOLE-TRIMETHOPRIM 800-160 MG PO TABS: 1.0000 | ORAL_TABLET | Freq: Two times a day (BID) | ORAL | Status: DC

## 2014-06-20 ENCOUNTER — Other Ambulatory Visit (INDEPENDENT_AMBULATORY_CARE_PROVIDER_SITE_OTHER): Payer: Medicare Other

## 2014-06-20 ENCOUNTER — Encounter: Payer: Self-pay | Admitting: Internal Medicine

## 2014-06-20 ENCOUNTER — Ambulatory Visit (INDEPENDENT_AMBULATORY_CARE_PROVIDER_SITE_OTHER): Payer: Medicare Other | Admitting: Internal Medicine

## 2014-06-20 VITALS — BP 126/82 | HR 85 | Temp 97.8°F | Resp 18 | Ht 65.0 in | Wt 209.0 lb

## 2014-06-20 DIAGNOSIS — G909 Disorder of the autonomic nervous system, unspecified: Secondary | ICD-10-CM

## 2014-06-20 DIAGNOSIS — Z0000 Encounter for general adult medical examination without abnormal findings: Secondary | ICD-10-CM | POA: Diagnosis not present

## 2014-06-20 DIAGNOSIS — G43709 Chronic migraine without aura, not intractable, without status migrainosus: Secondary | ICD-10-CM | POA: Diagnosis not present

## 2014-06-20 DIAGNOSIS — E782 Mixed hyperlipidemia: Secondary | ICD-10-CM | POA: Diagnosis not present

## 2014-06-20 DIAGNOSIS — G901 Familial dysautonomia [Riley-Day]: Secondary | ICD-10-CM

## 2014-06-20 LAB — LIPID PANEL
CHOLESTEROL: 240 mg/dL — AB (ref 0–200)
HDL: 30.2 mg/dL — ABNORMAL LOW (ref >39.00)
TRIGLYCERIDES: 412 mg/dL — AB (ref 0.0–149.0)
Total CHOL/HDL Ratio: 8

## 2014-06-20 LAB — LDL CHOLESTEROL, DIRECT: Direct LDL: 156 mg/dL

## 2014-06-20 MED ORDER — ATENOLOL 25 MG PO TABS: 25.0000 mg | ORAL_TABLET | Freq: Every day | ORAL | Status: DC

## 2014-06-20 MED ORDER — MIRTAZAPINE 45 MG PO TABS: 45.0000 mg | ORAL_TABLET | Freq: Every day | ORAL | Status: DC

## 2014-06-20 MED ORDER — DICLOFENAC SODIUM 75 MG PO TBEC: 75.0000 mg | DELAYED_RELEASE_TABLET | Freq: Two times a day (BID) | ORAL | Status: DC

## 2014-06-20 MED ORDER — BACLOFEN 10 MG PO TABS: 10.0000 mg | ORAL_TABLET | Freq: Three times a day (TID) | ORAL | Status: DC | PRN

## 2014-06-20 MED ORDER — PREGABALIN 100 MG PO CAPS: 100.0000 mg | ORAL_CAPSULE | Freq: Two times a day (BID) | ORAL | Status: DC

## 2014-06-20 MED ORDER — ALPRAZOLAM 0.5 MG PO TABS: 0.5000 mg | ORAL_TABLET | Freq: Two times a day (BID) | ORAL | Status: DC | PRN

## 2014-06-20 MED ORDER — PROMETHAZINE HCL 12.5 MG PO TABS: 12.5000 mg | ORAL_TABLET | Freq: Four times a day (QID) | ORAL | Status: DC | PRN

## 2014-06-20 MED ORDER — SERTRALINE HCL 50 MG PO TABS: 50.0000 mg | ORAL_TABLET | Freq: Every day | ORAL | Status: DC

## 2014-06-20 MED ORDER — GEMFIBROZIL 600 MG PO TABS: 600.0000 mg | ORAL_TABLET | Freq: Two times a day (BID) | ORAL | Status: DC

## 2014-06-20 NOTE — Assessment & Plan Note (Signed)
[facility] Improved with Botox injections which has allowed less Oxy use Will initiate controlled substance contract agreement and UDS today

## 2014-06-20 NOTE — Assessment & Plan Note (Signed)
POTS - on disability since 2013 due to same Med mgmt per cards - symptoms appear generally stable at this time

## 2014-06-20 NOTE — Progress Notes (Signed)
Pre visit review using our clinic review tool, if applicable. No additional management support is needed unless otherwise documented below in the visit note. 

## 2014-06-20 NOTE — Patient Instructions (Signed)
It was good to see you today.  We have reviewed your prior records including labs and tests today  Health Maintenance reviewed - please consider flu shot each fall and follow-up with gynecology for your Pap smear. All other recommended immunizations and age-appropriate screenings are up-to-date.  Signed narcotic contract agreement today. Urine drug screen submitted for compliance with same anticipating oxycodone refills here as discussed  Test(s) ordered today. Your results will be released to MyChart (or called to you) after review, usually within 72hours after test completion. If any changes need to be made, you will be notified at that same time.  Medications reviewed and updated, no changes recommended at this time.  Continue working with your specialists as reviewed and ongoing. Will consider rheumatology evaluation for possible Ehlers-Danlos syndrome as requested  Please schedule followup in 6-12 months for semiannual exam and labs, call sooner if problems.

## 2014-06-20 NOTE — Progress Notes (Signed)
Subjective:    Patient ID: Kayla Floyd, female    DOB: 03-14-1981, 34 y.o.   MRN: [id number]  HPI   Here for medicare wellness  Diet: heart healthy recommended  Physical activity: sedentary Depression/mood screen: negative Hearing: intact to whispered voice Visual acuity: grossly normal, performs annual eye exam  ADLs: capable Fall risk: none Home safety: good Cognitive evaluation: intact to orientation, naming, recall and repetition EOL planning: adv directives, full code/ I agree  I have personally reviewed and have noted 1. The patient's medical and social history 2. Their use of alcohol, tobacco or illicit drugs 3. Their current medications and supplements 4. The patient's functional ability including ADL's, fall risks, home safety risks and hearing or visual impairment. 5. Diet and physical activities 6. Evidence for depression or mood disorders  Also reviewed chronic conditions, interval events and current concerns Since last visit with me, patient moved to [place] but has now returned Working with Neuro at [employer] for migraines - improved with botox, min oxy use   Past Medical History  Diagnosis Date  . POLYCYSTIC OVARIAN DISEASE   . HYPERLIPIDEMIA   . MIGRAINE HEADACHE   . GERD   . BACK PAIN, CHRONIC   . Palpitations   . DYSAUTONOMIA 2011    "POTS" , recurrent syncope - follows with cards for same  . ANXIETY   . Tear meniscus knee 06/14/2010    right  . POTS (postural orthostatic tachycardia syndrome)    Family History  Problem Relation Age of Onset  . Colon cancer Maternal Grandmother 60   History  Substance Use Topics  . Smoking status: Former Smoker -- 1.00 packs/day for 10 years    Quit date: 03/25/2005  . Smokeless tobacco: Not on file  . Alcohol Use: 0.0 oz/week    0 Standard drinks or equivalent per week     Comment: occasionally    Review of Systems  Constitutional: Negative for fatigue and unexpected weight change.    Respiratory: Negative for cough, shortness of breath and wheezing.   Cardiovascular: Negative for chest pain, palpitations and leg swelling.  Gastrointestinal: Negative for nausea, abdominal pain and diarrhea.  Neurological: Negative for dizziness, weakness, light-headedness and headaches.  Psychiatric/Behavioral: Negative for dysphoric mood. The patient is not nervous/anxious.   All other systems reviewed and are negative.      Objective:    Physical Exam  Constitutional: She appears well-developed and well-nourished. No distress.  obese  Cardiovascular: Normal rate, regular rhythm and normal heart sounds.   No murmur heard. Pulmonary/Chest: Effort normal and breath sounds normal. No respiratory distress.  Musculoskeletal: She exhibits no edema.  Vitals reviewed.   BP 126/82 mmHg  Pulse 85  Temp(Src) 97.8 F (36.6 C) (Oral)  Resp 18  Ht  (1.651 m)  Wt 209 lb (94.802 kg)  BMI 34.78 kg/m2  SpO2 95% Wt Readings from Last 3 Encounters:  06/20/14 209 lb (94.802 kg)  03/29/14 216 lb 4 oz (98.09 kg)  03/26/13 216 lb 1.9 oz (98.031 kg)    Lab Results  Component Value Date   WBC 10.6* 04/23/2011   HGB 13.1 04/23/2011   HCT 36.7 04/23/2011   PLT 315 04/23/2011   GLUCOSE 108* 03/29/2014   CHOL 199 04/07/2013   TRIG 389* 04/07/2013   HDL 29* 04/07/2013   LDLCALC 92 04/07/2013   ALT 36* 04/07/2013   AST 49* 04/07/2013   NA 137 03/29/2014   K 3.7 03/29/2014   CL 111 03/29/2014  CREATININE 0.7 03/29/2014   BUN 10 03/29/2014   CO2 19 03/29/2014   TSH 0.84 06/06/2010   HGBA1C 5.6 04/07/2013    Dg Ankle Complete Right  05/28/2011   *RADIOLOGY REPORT*  Clinical Data: Ankle injury.  RIGHT ANKLE - COMPLETE 3+ VIEW  Comparison: None.  Findings: There is no fracture, dislocation, or joint effusion.  IMPRESSION: Normal exam.  Original Report Authenticated By: Gwynn BurlyJAMES H. MAXWELL, M.D.  Dg Knee Complete 4 Views Right  05/28/2011   *RADIOLOGY REPORT*  Clinical Data: Right  knee pain secondary to a fall.  RIGHT KNEE - COMPLETE 4+ VIEW  Comparison: 06/10/2010  Findings: There is no fracture or dislocation.  Moderate joint effusion.  No appreciable arthritic changes.  IMPRESSION: Moderate joint effusion.  No fracture.  Original Report Authenticated By: Gwynn BurlyJAMES H. MAXWELL, M.D.      Assessment & Plan:   AWV/z00.00 - Today patient counseled on age appropriate routine health concerns for screening and prevention, each reviewed and up to date or declined. Immunizations reviewed and up to date or declined. Labs ordered and reviewed. Risk factors for depression reviewed and negative. Hearing function and visual acuity are intact. ADLs screened and addressed as needed. Functional ability and level of safety reviewed and appropriate. Education, counseling and referrals performed based on assessed risks today. Patient provided with a copy of personalized plan for preventive services.  Problem List Items Addressed This Visit    Chronic migraine without aura without status migrainosus, not intractable    Follows with neuro for same at [facility] Improved with Botox injections which has allowed less Oxy use Will initiate controlled substance contract agreement and UDS today      Relevant Medications   oxyCODONE-acetaminophen (PERCOCET) 10-325 MG per tablet   atenolol (TENORMIN) tablet   baclofen (LIORESAL) tablet   diclofenac (VOLTAREN) EC tablet   gemfibrozil (LOPID) tablet 600 mg   pregabalin (LYRICA) capsule   mirtazapine (REMERON) tablet   sertraline (ZOLOFT) tablet   topiramate (TOPAMAX) tablet   Dysautonomia    POTS - on disability since 2013 due to same Med mgmt per cards - symptoms appear generally stable at this time      Hypercholesterolemia with hypertriglyceridemia    Previously on low-dose atorvastatin, [facility] of [facility] Recheck lipids now, titrate dose or resume statin as needed       Relevant Medications   atenolol (TENORMIN) tablet   gemfibrozil (LOPID) tablet 600 mg   Other Relevant Orders   Lipid panel    Other Visit Diagnoses    Routine general medical examination at a health care facility    -  Primary        Rene PaciValerie Leschber, MD

## 2014-06-20 NOTE — Assessment & Plan Note (Signed)
Previously on low-dose atorvastatin, [facility] because of [facility]lycer[facility] Recheck lipids now, titrate dose or resume statin as needed

## 2014-06-21 MED ORDER — ATORVASTATIN CALCIUM 10 MG PO TABS: 10.0000 mg | ORAL_TABLET | Freq: Every day | ORAL | Status: DC

## 2014-06-21 NOTE — Addendum Note (Signed)
Addended by: Rene PaciLESCHBER, VALERIE A on: 06/21/2014 02:54 PM   Modules accepted: Orders, SmartSet

## 2014-06-22 ENCOUNTER — Other Ambulatory Visit: Payer: Self-pay | Admitting: Internal Medicine

## 2014-07-08 ENCOUNTER — Other Ambulatory Visit: Payer: Self-pay | Admitting: Internal Medicine

## 2014-07-11 ENCOUNTER — Other Ambulatory Visit: Payer: Self-pay

## 2014-07-11 MED ORDER — ALPRAZOLAM 0.5 MG PO TABS: 0.5000 mg | ORAL_TABLET | Freq: Two times a day (BID) | ORAL | Status: DC | PRN

## 2014-07-22 ENCOUNTER — Encounter: Payer: Self-pay | Admitting: Internal Medicine

## 2014-07-22 MED ORDER — METHOCARBAMOL 750 MG PO TABS: 750.0000 mg | ORAL_TABLET | Freq: Three times a day (TID) | ORAL | Status: DC | PRN

## 2014-07-27 ENCOUNTER — Encounter: Payer: Self-pay | Admitting: Internal Medicine

## 2014-08-02 ENCOUNTER — Other Ambulatory Visit: Payer: Self-pay

## 2014-08-02 MED ORDER — TIZANIDINE HCL 4 MG PO TABS
4.0000 mg | ORAL_TABLET | Freq: Three times a day (TID) | ORAL | Status: DC | PRN
Start: 2014-08-02 — End: 2014-08-08

## 2014-08-08 ENCOUNTER — Other Ambulatory Visit: Payer: Self-pay | Admitting: Internal Medicine

## 2014-08-09 ENCOUNTER — Other Ambulatory Visit: Payer: Self-pay | Admitting: Internal Medicine

## 2014-09-22 ENCOUNTER — Telehealth: Payer: Self-pay | Admitting: Internal Medicine

## 2014-09-22 NOTE — Telephone Encounter (Signed)
[facility] called regarding patient has been prescribed gemfibrozil (LOPID) 600 MG tablet [[id number] and atorvastatin (LIPITOR) 10 MG tablet [[id number] which can't be taken together. Please advise pharmacy

## 2014-09-27 NOTE — Telephone Encounter (Signed)
Noted Please contact pt and instruct to DC lopid due to potential interaction risk with Lipitor Continue Lipitor daily as rx'd Lopid removed from med list thanks

## 2014-09-27 NOTE — Telephone Encounter (Signed)
LVM for pt to call back as soon as possible.   RE: Lopid dc   Spoke to pharmacy and informed of the same.

## 2014-12-22 ENCOUNTER — Ambulatory Visit: Payer: Medicare Other | Admitting: Internal Medicine

## 2015-01-17 ENCOUNTER — Other Ambulatory Visit: Payer: Self-pay | Admitting: Internal Medicine

## 2015-01-18 NOTE — Telephone Encounter (Signed)
Refill fax to [facility]...Raechel Chute/lmb

## 2015-02-09 ENCOUNTER — Other Ambulatory Visit: Payer: Self-pay | Admitting: Internal Medicine

## 2015-02-28 ENCOUNTER — Encounter: Payer: Self-pay | Admitting: Internal Medicine

## 2015-02-28 DIAGNOSIS — R200 Anesthesia of skin: Secondary | ICD-10-CM

## 2015-02-28 DIAGNOSIS — R202 Paresthesia of skin: Principal | ICD-10-CM

## 2015-03-03 MED ORDER — OMEPRAZOLE 40 MG PO CPDR: 40.0000 mg | DELAYED_RELEASE_CAPSULE | Freq: Every day | ORAL | Status: DC

## 2015-03-03 NOTE — Telephone Encounter (Signed)
12/21 with PCP.   Sending pt a message to est the best day of week and time of day.

## 2015-03-03 NOTE — Telephone Encounter (Signed)
Please be sure pt has scheduled appt with new PCP in next few months, in addition to OV with me this month

## 2015-03-15 ENCOUNTER — Ambulatory Visit (INDEPENDENT_AMBULATORY_CARE_PROVIDER_SITE_OTHER): Payer: Medicare Other | Admitting: Internal Medicine

## 2015-03-15 ENCOUNTER — Other Ambulatory Visit (INDEPENDENT_AMBULATORY_CARE_PROVIDER_SITE_OTHER): Payer: Medicare Other

## 2015-03-15 ENCOUNTER — Encounter: Payer: Self-pay | Admitting: Internal Medicine

## 2015-03-15 VITALS — BP 122/84 | HR 83 | Temp 97.9°F | Ht 65.0 in | Wt 214.0 lb

## 2015-03-15 DIAGNOSIS — G43001 Migraine without aura, not intractable, with status migrainosus: Secondary | ICD-10-CM

## 2015-03-15 DIAGNOSIS — G909 Disorder of the autonomic nervous system, unspecified: Secondary | ICD-10-CM

## 2015-03-15 DIAGNOSIS — G901 Familial dysautonomia [Riley-Day]: Secondary | ICD-10-CM

## 2015-03-15 DIAGNOSIS — R87619 Unspecified abnormal cytological findings in specimens from cervix uteri: Secondary | ICD-10-CM | POA: Diagnosis not present

## 2015-03-15 DIAGNOSIS — E782 Mixed hyperlipidemia: Secondary | ICD-10-CM | POA: Diagnosis not present

## 2015-03-15 DIAGNOSIS — R739 Hyperglycemia, unspecified: Secondary | ICD-10-CM | POA: Diagnosis not present

## 2015-03-15 DIAGNOSIS — I10 Essential (primary) hypertension: Secondary | ICD-10-CM

## 2015-03-15 DIAGNOSIS — E669 Obesity, unspecified: Secondary | ICD-10-CM

## 2015-03-15 LAB — URINALYSIS, ROUTINE W REFLEX MICROSCOPIC
BILIRUBIN URINE: NEGATIVE
Ketones, ur: NEGATIVE
LEUKOCYTES UA: NEGATIVE
NITRITE: NEGATIVE
PH: 6 (ref 5.0–8.0)
Specific Gravity, Urine: 1.025 (ref 1.000–1.030)
TOTAL PROTEIN, URINE-UPE24: NEGATIVE
URINE GLUCOSE: NEGATIVE
Urobilinogen, UA: 0.2 (ref 0.0–1.0)
WBC, UA: NONE SEEN (ref 0–?)

## 2015-03-15 LAB — LIPID PANEL
CHOL/HDL RATIO: 7
Cholesterol: 211 mg/dL — ABNORMAL HIGH (ref 0–200)
HDL: 31.1 mg/dL — ABNORMAL LOW (ref >39.00)
NONHDL: 179.66
Triglycerides: 326 mg/dL — ABNORMAL HIGH (ref 0.0–149.0)
VLDL: 65.2 mg/dL — ABNORMAL HIGH (ref 0.0–40.0)

## 2015-03-15 LAB — CBC WITH DIFFERENTIAL/PLATELET
BASOS PCT: 0.6 % (ref 0.0–3.0)
Basophils Absolute: 0.1 10*3/uL (ref 0.0–0.1)
Eosinophils Absolute: 0.1 10*3/uL (ref 0.0–0.7)
Eosinophils Relative: 1.3 % (ref 0.0–5.0)
HCT: 37.3 % (ref 36.0–46.0)
Hemoglobin: 12.5 g/dL (ref 12.0–15.0)
LYMPHS ABS: 3 10*3/uL (ref 0.7–4.0)
LYMPHS PCT: 35.2 % (ref 12.0–46.0)
MCHC: 33.5 g/dL (ref 30.0–36.0)
MCV: 82.6 fl (ref 78.0–100.0)
MONOS PCT: 5.1 % (ref 3.0–12.0)
Monocytes Absolute: 0.4 10*3/uL (ref 0.1–1.0)
NEUTROS ABS: 5 10*3/uL (ref 1.4–7.7)
Neutrophils Relative %: 57.8 % (ref 43.0–77.0)
PLATELETS: 305 10*3/uL (ref 150.0–400.0)
RBC: 4.51 Mil/uL (ref 3.87–5.11)
RDW: 12.9 % (ref 11.5–15.5)
WBC: 8.6 10*3/uL (ref 4.0–10.5)

## 2015-03-15 LAB — BASIC METABOLIC PANEL
BUN: 14 mg/dL (ref 6–23)
CALCIUM: 10.6 mg/dL — AB (ref 8.4–10.5)
CHLORIDE: 107 meq/L (ref 96–112)
CO2: 25 meq/L (ref 19–32)
CREATININE: 0.83 mg/dL (ref 0.40–1.20)
GFR: 83.06 mL/min (ref >60.00)
GLUCOSE: 104 mg/dL — AB (ref 70–99)
Potassium: 4.4 mEq/L (ref 3.5–5.1)
SODIUM: 140 meq/L (ref 135–145)

## 2015-03-15 LAB — HEPATIC FUNCTION PANEL
ALT: 44 U/L — ABNORMAL HIGH (ref 0–35)
AST: 29 U/L (ref 0–37)
Albumin: 4.5 g/dL (ref 3.5–5.2)
Alkaline Phosphatase: 65 U/L (ref 39–117)
BILIRUBIN DIRECT: 0.1 mg/dL (ref 0.0–0.3)
BILIRUBIN TOTAL: 0.4 mg/dL (ref 0.2–1.2)
Total Protein: 7.9 g/dL (ref 6.0–8.3)

## 2015-03-15 LAB — HEMOGLOBIN A1C: Hgb A1c MFr Bld: 5.6 % (ref 4.6–6.5)

## 2015-03-15 LAB — LDL CHOLESTEROL, DIRECT: Direct LDL: 134 mg/dL

## 2015-03-15 MED ORDER — OXYCODONE-ACETAMINOPHEN 10-325 MG PO TABS: 0.5000 | ORAL_TABLET | Freq: Four times a day (QID) | ORAL | Status: DC | PRN

## 2015-03-15 NOTE — Assessment & Plan Note (Signed)
[facility] Improved with Botox injections which has allowed less Oxy use Initiated controlled substance contract agreement and UDS 05/2014 - will provide refill today [facility]

## 2015-03-15 NOTE — Assessment & Plan Note (Signed)
Wt Readings from Last 3 Encounters:  03/15/15 214 lb (97.07 kg)  06/20/14 209 lb (94.802 kg)  03/29/14 216 lb 4 oz (98.09 kg)   Body mass index is 35.61 kg/(m^2). The patient is asked to make an attempt to improve diet and exercise patterns to aid in medical management of this problem.

## 2015-03-15 NOTE — Progress Notes (Signed)
Patient received education resource, including the self-management goal and tool. Patient verbalized understanding. 

## 2015-03-15 NOTE — Patient Instructions (Signed)
It was good to see you today.  We have reviewed your prior records including labs and tests today  Test(s) ordered today. Your results will be released to MyChart (or called to you) after review, usually within 72hours after test completion. If any changes need to be made, you will be notified at that same time.  Medications reviewed and updated, no changes recommended at this time. Refill on medication(s) as discussed today.  we'll make referral to gynecology. Our office will contact you regarding appointment(s) once made.  Please schedule followup in 3-4 months with new PCP Dr Lawerance BachBurns, please call sooner if problems.

## 2015-03-15 NOTE — Assessment & Plan Note (Signed)
Previously on low-dose atorvastatin, changed to gemfibrozil summer 2015 by [facility] provider because of hypertriglyceridemia Changed back to statin 05/2014 due to uncontrolled lipids Recheck lipids now, titrate dose of statin as needed

## 2015-03-15 NOTE — Assessment & Plan Note (Addendum)
Check a1c Prev on metformin for PCOS but none since 2013 The patient is asked to make an attempt to improve diet and exercise patterns to aid in medical management of this problem. Lab Results  Component Value Date   HGBA1C 5.6 04/07/2013

## 2015-03-15 NOTE — Assessment & Plan Note (Signed)
POTS -hx syncope related to same- on disability since 2013 due to same Med mgmt per cards - symptoms appear generally stable at this time

## 2015-03-15 NOTE — Progress Notes (Signed)
Subjective:    Patient ID: Kayla Floyd, female    DOB: 1980/04/21, 34 y.o.   MRN: [id number]  HPI  Patient here for follow-up. Also reviewed chronic medical conditions, interval events and current concerns  Past Medical History  Diagnosis Date  . POLYCYSTIC OVARIAN DISEASE   . HYPERLIPIDEMIA   . MIGRAINE HEADACHE   . GERD   . BACK PAIN, CHRONIC   . Palpitations   . DYSAUTONOMIA 2011    "POTS" , recurrent syncope - follows with cards for same  . ANXIETY   . Tear meniscus knee 06/14/2010    right  . POTS (postural orthostatic tachycardia syndrome)     Review of Systems  Constitutional: Positive for fatigue. Negative for unexpected weight change.  Respiratory: Negative for cough and shortness of breath.   Cardiovascular: Negative for chest pain and leg swelling.       Objective:    Physical Exam  Constitutional: She appears well-developed and well-nourished. No distress.  obese  Cardiovascular: Normal rate, regular rhythm and normal heart sounds.   No murmur heard. Pulmonary/Chest: Effort normal and breath sounds normal. No respiratory distress.  Musculoskeletal: She exhibits no edema.  Vitals reviewed.   BP 122/84 mmHg  Pulse 83  Temp(Src) 97.9 F (36.6 C) (Oral)  Ht  (1.651 m)  Wt 214 lb (97.07 kg)  BMI 35.61 kg/m2  SpO2 98%  LMP 03/12/2015 Wt Readings from Last 3 Encounters:  03/15/15 214 lb (97.07 kg)  06/20/14 209 lb (94.802 kg)  03/29/14 216 lb 4 oz (98.09 kg)    Lab Results  Component Value Date   WBC 10.6* 04/23/2011   HGB 13.1 04/23/2011   HCT 36.7 04/23/2011   PLT 315 04/23/2011   GLUCOSE 108* 03/29/2014   CHOL 240* 06/20/2014   TRIG 412.0* 06/20/2014   HDL 30.20* 06/20/2014   LDLDIRECT 156.0 06/20/2014   LDLCALC 92 04/07/2013   ALT 36* 04/07/2013   AST 49* 04/07/2013   NA 137 03/29/2014   K 3.7 03/29/2014   CL 111 03/29/2014   CREATININE 0.7 03/29/2014   BUN 10 03/29/2014   CO2 19 03/29/2014   TSH 0.84  06/06/2010   HGBA1C 5.6 04/07/2013    Dg Ankle Complete Right  05/28/2011  *RADIOLOGY REPORT* Clinical Data: Ankle injury. RIGHT ANKLE - COMPLETE 3+ VIEW Comparison: None. Findings: There is no fracture, dislocation, or joint effusion. IMPRESSION: Normal exam. Original Report Authenticated By: Gwynn Burly, M.D.  Dg Knee Complete 4 Views Right  05/28/2011  *RADIOLOGY REPORT* Clinical Data: Right knee pain secondary to a fall. RIGHT KNEE - COMPLETE 4+ VIEW Comparison: 06/10/2010 Findings: There is no fracture or dislocation.  Moderate joint effusion.  No appreciable arthritic changes. IMPRESSION: Moderate joint effusion.  No fracture. Original Report Authenticated By: Gwynn Burly, M.D.      Assessment & Plan:   Problem List Items Addressed This Visit    Dysautonomia    POTS -hx syncope related to same- on disability since 2013 due to same Med mgmt per cards - symptoms appear generally stable at this time      Essential hypertension    BP Readings from Last 3 Encounters:  03/15/15 122/84  06/20/14 126/82  03/29/14 126/90   The current medical regimen is effective;  continue present plan and medications.       Hypercholesterolemia with hypertriglyceridemia - Primary    Previously on low-dose atorvastatin, changed to gemfibrozil summer 2015 by [facility] provider because of hypertriglyceridemia Changed  back to statin 05/2014 due to uncontrolled lipids Recheck lipids now, titrate dose of statin as needed      Relevant Orders   Lipid panel   Urinalysis, Routine w reflex microscopic ([facility])   Hyperglycemia    Check a1c Prev on metformin for PCOS but none since 2013 The patient is asked to make an attempt to improve diet and exercise patterns to aid in medical management of this problem. Lab Results  Component Value Date   HGBA1C 5.6 04/07/2013         Relevant Orders   Basic metabolic panel   Lipid panel   Urinalysis, Routine w reflex microscopic (not [facility])     Hemoglobin A1c   Migraine    [facility] Improved with Botox injections which has allowed less Oxy use Initiated controlled substance contract agreement and UDS 05/2014 - will provide refill today Pending 2nd opinion with [facility] 03/2015      Relevant Medications   oxyCODONE-acetaminophen (PERCOCET) 10-325 MG tablet   Other Relevant Orders   CBC with Differential/Platelet   Hepatic function panel   Urinalysis, Routine w reflex microscopic ([facility])   Obese    Wt Readings from Last 3 Encounters:  03/15/15 214 lb (97.07 kg)  06/20/14 209 lb (94.802 kg)  03/29/14 216 lb 4 oz (98.09 kg)   Body mass index is 35.61 kg/(m^2). The patient is asked to make an attempt to improve diet and exercise patterns to aid in medical management of this problem.        Other Visit Diagnoses    Abnormal cervical Papanicolaou smear, unspecified abnormal pap finding        Relevant Orders    Urinalysis, Routine w reflex microscopic ([facility])    Ambulatory referral to Gynecology        Rene PaciValerie Ashlee Bewley, MD

## 2015-03-15 NOTE — Assessment & Plan Note (Signed)
BP Readings from Last 3 Encounters:  03/15/15 122/84  06/20/14 126/82  03/29/14 126/90   The current medical regimen is effective;  continue present plan and medications.

## 2015-03-29 ENCOUNTER — Ambulatory Visit: Payer: Medicare Other | Admitting: Neurology

## 2015-03-29 ENCOUNTER — Other Ambulatory Visit: Payer: Self-pay | Admitting: *Deleted

## 2015-03-29 ENCOUNTER — Ambulatory Visit (INDEPENDENT_AMBULATORY_CARE_PROVIDER_SITE_OTHER): Payer: Medicare Other | Admitting: Neurology

## 2015-03-29 ENCOUNTER — Encounter: Payer: Self-pay | Admitting: Neurology

## 2015-03-29 VITALS — BP 130/90 | HR 88 | Ht 65.0 in | Wt 218.5 lb

## 2015-03-29 DIAGNOSIS — R202 Paresthesia of skin: Secondary | ICD-10-CM

## 2015-03-29 DIAGNOSIS — R292 Abnormal reflex: Secondary | ICD-10-CM | POA: Diagnosis not present

## 2015-03-29 DIAGNOSIS — G5712 Meralgia paresthetica, left lower limb: Secondary | ICD-10-CM

## 2015-03-29 DIAGNOSIS — M5441 Lumbago with sciatica, right side: Secondary | ICD-10-CM | POA: Diagnosis not present

## 2015-03-29 DIAGNOSIS — Z9889 Other specified postprocedural states: Secondary | ICD-10-CM | POA: Diagnosis not present

## 2015-03-29 DIAGNOSIS — M5442 Lumbago with sciatica, left side: Secondary | ICD-10-CM

## 2015-03-29 MED ORDER — PREGABALIN 100 MG PO CAPS: 100.0000 mg | ORAL_CAPSULE | Freq: Three times a day (TID) | ORAL | Status: DC
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
## 2015-03-29 NOTE — Patient Instructions (Addendum)
1.  MRI lumbar spine wo contrast 2.  Increase Lyrica 100mg  three times daily 3.  We will call you with the results of the testing and give further recommendations

## 2015-03-29 NOTE — Telephone Encounter (Signed)
atenolol (TENORMIN) 25 MG tablet  Medication   Date: 06/20/2014  [facility]  Ordering/Authorizing: Newt LukesValerie A Leschber, MD      Order Providers    Prescribing Provider Encounter Provider   Newt LukesValerie A Leschber, MD Newt LukesValerie A Leschber, MD    Medication Detail      Disp Refills Start End     atenolol (TENORMIN) 25 MG tablet 30 tablet 11 06/20/2014     Sig - Route: Take 1 tablet (25 mg total) by mouth daily. - Oral    Class: No Print    Notes to Pharmacy: Dosage change     Pharmacy    [facility] - [place], [place] - [address]

## 2015-04-04 ENCOUNTER — Ambulatory Visit: Payer: Self-pay | Admitting: Gynecology

## 2015-04-06 ENCOUNTER — Encounter: Payer: Self-pay | Admitting: Internal Medicine

## 2015-04-06 ENCOUNTER — Other Ambulatory Visit: Payer: Self-pay | Admitting: Internal Medicine

## 2015-04-06 ENCOUNTER — Inpatient Hospital Stay: Admission: RE | Admit: 2015-04-06 | Payer: PRIVATE HEALTH INSURANCE | Source: Ambulatory Visit

## 2015-04-06 ENCOUNTER — Encounter: Payer: Self-pay | Admitting: Gynecology

## 2015-04-09 MED ORDER — ATENOLOL 25 MG PO TABS
25.0000 mg | ORAL_TABLET | Freq: Every day | ORAL | Status: DC
Start: 2015-04-09 — End: 2015-07-10

## 2015-04-10 ENCOUNTER — Ambulatory Visit
Admission: RE | Admit: 2015-04-10 | Discharge: 2015-04-10 | Disposition: A | Discharge status: Home / Self Care | Payer: Medicare Other | Source: Ambulatory Visit | Attending: Neurology | Admitting: Neurology

## 2015-04-10 DIAGNOSIS — M5126 Other intervertebral disc displacement, lumbar region: Secondary | ICD-10-CM | POA: Diagnosis not present

## 2015-04-10 DIAGNOSIS — M5441 Lumbago with sciatica, right side: Secondary | ICD-10-CM

## 2015-04-10 DIAGNOSIS — M5442 Lumbago with sciatica, left side: Secondary | ICD-10-CM

## 2015-04-10 DIAGNOSIS — Z9889 Other specified postprocedural states: Secondary | ICD-10-CM

## 2015-04-10 DIAGNOSIS — R202 Paresthesia of skin: Secondary | ICD-10-CM

## 2015-04-10 DIAGNOSIS — R292 Abnormal reflex: Secondary | ICD-10-CM

## 2015-04-11 ENCOUNTER — Telehealth: Payer: Self-pay | Admitting: Neurology

## 2015-04-11 NOTE — Telephone Encounter (Signed)
MRI results discussed with patient, there is mild progression of L3-4 disc protrusion which may explain some of her proximal leg pain.  She is already taking two muscle relaxants, NSAIDs, and Lyrica  TID.  Recommend PT for lumbar radiculopathy, which she is agreeable to.   Kayla K. Allena Katz, DO

## 2015-04-12 NOTE — Telephone Encounter (Signed)
Referral sent to [facility].

## 2015-04-13 ENCOUNTER — Ambulatory Visit: Payer: Self-pay | Admitting: Gynecology

## 2015-04-13 ENCOUNTER — Encounter: Payer: Self-pay | Admitting: Gynecology

## 2015-04-14 DIAGNOSIS — M5416 Radiculopathy, lumbar region: Secondary | ICD-10-CM | POA: Diagnosis not present

## 2015-04-14 DIAGNOSIS — M62838 Other muscle spasm: Secondary | ICD-10-CM | POA: Diagnosis not present

## 2015-04-14 DIAGNOSIS — M6281 Muscle weakness (generalized): Secondary | ICD-10-CM | POA: Diagnosis not present

## 2015-04-17 DIAGNOSIS — M6281 Muscle weakness (generalized): Secondary | ICD-10-CM | POA: Diagnosis not present

## 2015-04-17 DIAGNOSIS — M5416 Radiculopathy, lumbar region: Secondary | ICD-10-CM | POA: Diagnosis not present

## 2015-04-17 DIAGNOSIS — M62838 Other muscle spasm: Secondary | ICD-10-CM | POA: Diagnosis not present

## 2015-04-20 DIAGNOSIS — M5416 Radiculopathy, lumbar region: Secondary | ICD-10-CM | POA: Diagnosis not present

## 2015-04-20 DIAGNOSIS — M6281 Muscle weakness (generalized): Secondary | ICD-10-CM | POA: Diagnosis not present

## 2015-04-20 DIAGNOSIS — M62838 Other muscle spasm: Secondary | ICD-10-CM | POA: Diagnosis not present

## 2015-04-24 DIAGNOSIS — G43709 Chronic migraine without aura, not intractable, without status migrainosus: Secondary | ICD-10-CM | POA: Diagnosis not present

## 2015-04-25 DIAGNOSIS — M6281 Muscle weakness (generalized): Secondary | ICD-10-CM | POA: Diagnosis not present

## 2015-04-25 DIAGNOSIS — M5416 Radiculopathy, lumbar region: Secondary | ICD-10-CM | POA: Diagnosis not present

## 2015-04-25 DIAGNOSIS — M62838 Other muscle spasm: Secondary | ICD-10-CM | POA: Diagnosis not present

## 2015-04-27 DIAGNOSIS — R531 Weakness: Secondary | ICD-10-CM | POA: Diagnosis not present

## 2015-04-27 DIAGNOSIS — M5416 Radiculopathy, lumbar region: Secondary | ICD-10-CM | POA: Diagnosis not present

## 2015-04-27 DIAGNOSIS — M6281 Muscle weakness (generalized): Secondary | ICD-10-CM | POA: Diagnosis not present

## 2015-04-27 DIAGNOSIS — M62838 Other muscle spasm: Secondary | ICD-10-CM | POA: Diagnosis not present

## 2015-04-27 DIAGNOSIS — M357 Hypermobility syndrome: Secondary | ICD-10-CM | POA: Diagnosis not present

## 2015-05-01 DIAGNOSIS — M5416 Radiculopathy, lumbar region: Secondary | ICD-10-CM | POA: Diagnosis not present

## 2015-05-01 DIAGNOSIS — M357 Hypermobility syndrome: Secondary | ICD-10-CM | POA: Diagnosis not present

## 2015-05-01 DIAGNOSIS — M62838 Other muscle spasm: Secondary | ICD-10-CM | POA: Diagnosis not present

## 2015-05-01 DIAGNOSIS — M6281 Muscle weakness (generalized): Secondary | ICD-10-CM | POA: Diagnosis not present

## 2015-05-01 DIAGNOSIS — R531 Weakness: Secondary | ICD-10-CM | POA: Diagnosis not present

## 2015-05-04 DIAGNOSIS — R531 Weakness: Secondary | ICD-10-CM | POA: Diagnosis not present

## 2015-05-04 DIAGNOSIS — M357 Hypermobility syndrome: Secondary | ICD-10-CM | POA: Diagnosis not present

## 2015-05-04 DIAGNOSIS — M5416 Radiculopathy, lumbar region: Secondary | ICD-10-CM | POA: Diagnosis not present

## 2015-05-04 DIAGNOSIS — M62838 Other muscle spasm: Secondary | ICD-10-CM | POA: Diagnosis not present

## 2015-05-04 DIAGNOSIS — M6281 Muscle weakness (generalized): Secondary | ICD-10-CM | POA: Diagnosis not present

## 2015-05-05 ENCOUNTER — Ambulatory Visit (INDEPENDENT_AMBULATORY_CARE_PROVIDER_SITE_OTHER): Payer: Medicare Other | Admitting: Gynecology

## 2015-05-05 ENCOUNTER — Other Ambulatory Visit (HOSPITAL_COMMUNITY)
Admission: RE | Admit: 2015-05-05 | Discharge: 2015-05-05 | Disposition: A | Discharge status: Home / Self Care | Payer: Medicare Other | Source: Ambulatory Visit | Attending: Gynecology | Admitting: Gynecology

## 2015-05-05 ENCOUNTER — Encounter: Payer: Self-pay | Admitting: Gynecology

## 2015-05-05 VITALS — BP 128/80 | Ht 65.0 in | Wt 216.0 lb

## 2015-05-05 DIAGNOSIS — Z1151 Encounter for screening for human papillomavirus (HPV): Secondary | ICD-10-CM | POA: Diagnosis not present

## 2015-05-05 DIAGNOSIS — N926 Irregular menstruation, unspecified: Secondary | ICD-10-CM

## 2015-05-05 DIAGNOSIS — Z01419 Encounter for gynecological examination (general) (routine) without abnormal findings: Secondary | ICD-10-CM

## 2015-05-05 LAB — TSH: TSH: 1.79 m[IU]/L

## 2015-05-05 LAB — HM PAP SMEAR: HM Pap smear: NEGATIVE

## 2015-05-05 NOTE — Patient Instructions (Signed)
Office will call you to help arrange an appointment with the infertility specialist Dr. April Manson.  Follow up for ultrasound in this office as scheduled

## 2015-05-05 NOTE — Progress Notes (Signed)
Kayla Floyd [birth year] [id number]        35 y.o.  G0P0000  New patient for annual exam.  Several issues noted below.  Past medical history,surgical history, problem list, medications, allergies, family history and social history were all reviewed and documented as reviewed in the EPIC chart.  ROS:  Performed with pertinent positives and negatives included in the history, assessment and plan.   Additional significant findings :  none   Exam: Delena Serve Vitals:   05/05/15 1206  BP: 128/80  Height:  (1.651 m)  Weight: 216 lb (97.977 kg)   General appearance:  Normal affect, orientation and appearance. Skin: Grossly normal HEENT: Without gross lesions.  No cervical or supraclavicular adenopathy. Thyroid normal.  Lungs:  Clear without wheezing, rales or rhonchi Cardiac: RR, without RMG Abdominal:  Soft, nontender, without masses, guarding, rebound, organomegaly or hernia Breasts:  Examined lying and sitting without masses, retractions, discharge or axillary adenopathy. Pelvic:  Ext/BUS/vagina normal  Cervix normal. Pap smear/HPV  Uterus anteverted, normal size, shape and contour, midline and mobile nontender   Adnexa without masses or tenderness    Anus and perineum normal   Rectovaginal normal sphincter tone without palpated masses or tenderness.    Assessment/Plan:  35 y.o. G0P0000 female for annual exam with irregular menses, no contraception.   1. Irregular menses.  Patient has long history of PCOS. Had long skips in her menses previously. They are more regular now but still will skip one or 2 months at a time or have 2 menses in one month. Was told previously that her lining had been overgrown. Recommend baseline sonohysterogram now for pelvic surveillance and endometrial assessment. Patient agrees to schedule.  Check baseline TSH today 2. Infertility. Patient had tried Clomid in the past unsuccessfully. Not using contraception and would accept  pregnancy. Reviewed options with her and given age and circumstance recommend appointment with reproductive endocrinology for more aggressive assessment it treatment is a feel appropriate.  Also reviewed that she is on a number of medications that would need to be modified or discontinued before early in pregnancy. Need to follow up with her physician whose managing these medications to discuss her pregnancy desires. 3. Pap smear 2010. Pap smear/HPV today. No history of abnormal Pap smears previously. 4. Mammography never. Discussed screening mammographic recommendations between 35 and 40. Does relate that her paternal grandmother may have had premenopausal breast cancer. Asked her to try to determine her age at the time of diagnosis. If  Premenopausal the issue of genetic linkage was reviewed and options for genetic referral counseling discussed. Patient will try to find out the age and follow up if she wants to pursue genetic counseling. 5. Health maintenance. Patient requests screening HIV. No other blood work done as this has recently been done through her primary physician's office. Follow up for sonohysterogram and appointment with reproductive endocrinology.  Dara Lords MD, 12:45 PM 05/05/2015

## 2015-05-06 LAB — HIV ANTIBODY (ROUTINE TESTING W REFLEX): HIV: NONREACTIVE

## 2015-05-08 ENCOUNTER — Telehealth: Payer: Self-pay | Admitting: *Deleted

## 2015-05-08 DIAGNOSIS — M357 Hypermobility syndrome: Secondary | ICD-10-CM | POA: Diagnosis not present

## 2015-05-08 DIAGNOSIS — M5416 Radiculopathy, lumbar region: Secondary | ICD-10-CM | POA: Diagnosis not present

## 2015-05-08 DIAGNOSIS — M62838 Other muscle spasm: Secondary | ICD-10-CM | POA: Diagnosis not present

## 2015-05-08 DIAGNOSIS — M6281 Muscle weakness (generalized): Secondary | ICD-10-CM | POA: Diagnosis not present

## 2015-05-08 DIAGNOSIS — R531 Weakness: Secondary | ICD-10-CM | POA: Diagnosis not present

## 2015-05-08 NOTE — Telephone Encounter (Signed)
-----   Message from Dara Lords, MD sent at 05/05/2015 12:50 PM EST ----- Referral to Dr. April Manson reference infertility, history of PCOS

## 2015-05-08 NOTE — Telephone Encounter (Signed)
Referral faxed they will contact pt to schedule. 

## 2015-05-09 LAB — CYTOLOGY - PAP

## 2015-05-11 DIAGNOSIS — M6281 Muscle weakness (generalized): Secondary | ICD-10-CM | POA: Diagnosis not present

## 2015-05-11 DIAGNOSIS — M5416 Radiculopathy, lumbar region: Secondary | ICD-10-CM | POA: Diagnosis not present

## 2015-05-11 DIAGNOSIS — R531 Weakness: Secondary | ICD-10-CM | POA: Diagnosis not present

## 2015-05-11 DIAGNOSIS — M357 Hypermobility syndrome: Secondary | ICD-10-CM | POA: Diagnosis not present

## 2015-05-11 DIAGNOSIS — M62838 Other muscle spasm: Secondary | ICD-10-CM | POA: Diagnosis not present

## 2015-05-12 DIAGNOSIS — Z319 Encounter for procreative management, unspecified: Secondary | ICD-10-CM | POA: Diagnosis not present

## 2015-05-12 DIAGNOSIS — Z13228 Encounter for screening for other metabolic disorders: Secondary | ICD-10-CM | POA: Diagnosis not present

## 2015-05-12 DIAGNOSIS — Z130 Encounter for screening for diseases of the blood and blood-forming organs and certain disorders involving the immune mechanism: Secondary | ICD-10-CM | POA: Diagnosis not present

## 2015-05-12 DIAGNOSIS — E288 Other ovarian dysfunction: Secondary | ICD-10-CM | POA: Diagnosis not present

## 2015-05-15 DIAGNOSIS — M6281 Muscle weakness (generalized): Secondary | ICD-10-CM | POA: Diagnosis not present

## 2015-05-15 DIAGNOSIS — M5416 Radiculopathy, lumbar region: Secondary | ICD-10-CM | POA: Diagnosis not present

## 2015-05-15 DIAGNOSIS — M62838 Other muscle spasm: Secondary | ICD-10-CM | POA: Diagnosis not present

## 2015-05-16 NOTE — Telephone Encounter (Signed)
Pt was scheduled on 05/12/15

## 2015-05-17 DIAGNOSIS — Z319 Encounter for procreative management, unspecified: Secondary | ICD-10-CM | POA: Diagnosis not present

## 2015-05-22 DIAGNOSIS — M357 Hypermobility syndrome: Secondary | ICD-10-CM | POA: Diagnosis not present

## 2015-05-22 DIAGNOSIS — M6281 Muscle weakness (generalized): Secondary | ICD-10-CM | POA: Diagnosis not present

## 2015-05-22 DIAGNOSIS — M62838 Other muscle spasm: Secondary | ICD-10-CM | POA: Diagnosis not present

## 2015-05-22 DIAGNOSIS — R531 Weakness: Secondary | ICD-10-CM | POA: Diagnosis not present

## 2015-05-22 DIAGNOSIS — M5416 Radiculopathy, lumbar region: Secondary | ICD-10-CM | POA: Diagnosis not present

## 2015-05-25 DIAGNOSIS — R531 Weakness: Secondary | ICD-10-CM | POA: Diagnosis not present

## 2015-05-25 DIAGNOSIS — M62838 Other muscle spasm: Secondary | ICD-10-CM | POA: Diagnosis not present

## 2015-05-25 DIAGNOSIS — M5416 Radiculopathy, lumbar region: Secondary | ICD-10-CM | POA: Diagnosis not present

## 2015-05-25 DIAGNOSIS — M357 Hypermobility syndrome: Secondary | ICD-10-CM | POA: Diagnosis not present

## 2015-05-25 DIAGNOSIS — M6281 Muscle weakness (generalized): Secondary | ICD-10-CM | POA: Diagnosis not present

## 2015-05-29 ENCOUNTER — Ambulatory Visit (INDEPENDENT_AMBULATORY_CARE_PROVIDER_SITE_OTHER): Payer: Medicare Other | Admitting: Internal Medicine

## 2015-05-29 ENCOUNTER — Encounter: Payer: Self-pay | Admitting: Internal Medicine

## 2015-05-29 VITALS — BP 112/76 | HR 90 | Temp 98.4°F | Resp 16 | Wt 221.0 lb

## 2015-05-29 DIAGNOSIS — M5416 Radiculopathy, lumbar region: Secondary | ICD-10-CM | POA: Diagnosis not present

## 2015-05-29 DIAGNOSIS — G8929 Other chronic pain: Secondary | ICD-10-CM

## 2015-05-29 DIAGNOSIS — G909 Disorder of the autonomic nervous system, unspecified: Secondary | ICD-10-CM | POA: Diagnosis not present

## 2015-05-29 DIAGNOSIS — G43001 Migraine without aura, not intractable, with status migrainosus: Secondary | ICD-10-CM

## 2015-05-29 DIAGNOSIS — F419 Anxiety disorder, unspecified: Secondary | ICD-10-CM | POA: Diagnosis not present

## 2015-05-29 DIAGNOSIS — M549 Dorsalgia, unspecified: Secondary | ICD-10-CM

## 2015-05-29 DIAGNOSIS — R531 Weakness: Secondary | ICD-10-CM | POA: Diagnosis not present

## 2015-05-29 DIAGNOSIS — K219 Gastro-esophageal reflux disease without esophagitis: Secondary | ICD-10-CM

## 2015-05-29 DIAGNOSIS — M62838 Other muscle spasm: Secondary | ICD-10-CM | POA: Diagnosis not present

## 2015-05-29 DIAGNOSIS — G901 Familial dysautonomia [Riley-Day]: Secondary | ICD-10-CM

## 2015-05-29 DIAGNOSIS — M357 Hypermobility syndrome: Secondary | ICD-10-CM | POA: Diagnosis not present

## 2015-05-29 DIAGNOSIS — I10 Essential (primary) hypertension: Secondary | ICD-10-CM

## 2015-05-29 DIAGNOSIS — M6281 Muscle weakness (generalized): Secondary | ICD-10-CM | POA: Diagnosis not present

## 2015-05-29 MED ORDER — PREGABALIN 100 MG PO CAPS: 100.0000 mg | ORAL_CAPSULE | Freq: Two times a day (BID) | ORAL | Status: DC

## 2015-05-29 NOTE — Patient Instructions (Addendum)
Make a cardiology follow up appointment.    All other Health Maintenance issues reviewed.   All recommended immunizations and age-appropriate screenings are up-to-date.  No immunizations administered today.   Medications reviewed and updated.  No changes recommended at this time.    Please schedule followup in  4 months

## 2015-05-29 NOTE — Assessment & Plan Note (Signed)
On zoloft and takes xanax only as needed - try to keep to a minimum

## 2015-05-29 NOTE — Progress Notes (Signed)
Pre visit review using our clinic review tool, if applicable. No additional management support is needed unless otherwise documented below in the visit note. 

## 2015-05-29 NOTE — Assessment & Plan Note (Signed)
Controlled continue atenolol

## 2015-05-29 NOTE — Assessment & Plan Note (Signed)
On omeprazole daily Controlled Continue same

## 2015-05-29 NOTE — Assessment & Plan Note (Signed)
Following with cardio On atenolol Did not tolerate high sodium diet Not wearing compression stockings Doing PT, but no other regular exercise On disability since 2013

## 2015-05-29 NOTE — Assessment & Plan Note (Signed)
Tizanidine as needed lyrica for nerve pain - sciatica and meralgia paresthetica Diclofenac  Oxycodone only as needed Doing PT

## 2015-05-29 NOTE — Assessment & Plan Note (Signed)
On topamax Getting botox

## 2015-05-29 NOTE — Progress Notes (Signed)
Subjective:    Patient ID: Kayla Floyd, female    DOB: Mar 05, 1981, 35 y.o.   MRN: [id number]  HPI She is here to establish with a new pcp.   She has POTS diagnosed is 2011 which developed after spine surgery.  She had cervical fusion in 2010 and laminectomy in 2008. She has chronic pain issues from those issues.  She does not wear compression stockings.  She goes to PT twice a week.    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  She thinks that is what has caused the above.  She frquently has strains and sprains.    She recently saw dr Allena Katz, neuro and she had an MRI.  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     She has seen Dr Audie Box her gyn.  They discussed fertility.  She saw a fertility specialist.  She has done fertility treatments in the past and they were not effective.  She has gone on metformin and provera.  She has gained weight from the medication.  She will have a biopsy of her uterus and then may try a round of fertility meds.  She knows she will need to go off of some of her medications.    Chronic pain:  Most of her back is lower back into her hips.  Her pain is constant and it is a dull ache.  The intensity of the pain waxes and wanes.  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  She has whole body achiness.  She uses the tizanidine a few times a month and oxycodone as needed (fills it twice a year).  She takes the diclofenac twice a day, which is very helpful.    Migraines;  She gets botox and takes Topamax.    Anxiety: she uses xanax as needed a few times a month.  She tries not to take this often.    Depression:  She takes remeron ( used more for sleep and muscle pain) more than for depression.  She takes zoloft daily.  She does not feel she will have difficulty coming off the zoloft when needed.     Medications and allergies reviewed with patient and updated if appropriate.  Patient Active Problem List   Diagnosis Date Noted  . Hyperglycemia 03/15/2015  . Obese 03/15/2015  . Chronic migraine without aura without status migrainosus, not intractable 06/20/2014  . Tear meniscus knee 06/14/2010  . Dysautonomia 03/20/2010  . ANXIETY 07/30/2009  . Migraine 07/28/2009  . Essential hypertension 07/28/2009  . SYNCOPE AND COLLAPSE 07/10/2009  . PALPITATIONS 07/10/2009  . POLYCYSTIC OVARIAN DISEASE 08/31/2008  . Hypercholesterolemia with hypertriglyceridemia 08/31/2008  . GERD 08/31/2008  . Chronic back pain 08/31/2008    Current Outpatient Prescriptions on File Prior to Visit  Medication Sig Dispense Refill  . ALPRAZolam (XANAX) 0.5 MG tablet Take 1 tablet (0.5 mg total) by mouth 2 (two) times daily as needed for anxiety. 60 tablet 5  . atenolol (TENORMIN) 25 MG tablet Take 1 tablet (25 mg total) by mouth daily. 90 tablet 0  . atorvastatin (LIPITOR) 10 MG tablet Take 1 tablet (10 mg total) by mouth daily. 90 tablet 3  . botulinum toxin Type A (BOTOX) 100 UNITS SOLR injection Inject 100 Units into the muscle every 3 (three) months. Every 12 weeks at [facility] 2  vial   . cetirizine (ZYRTEC) 10 MG tablet Take 10 mg by mouth daily.      . diclofenac (VOLTAREN) 75 MG EC tablet TAKE ONE (1) TABLET BY MOUTH TWICE DAILY 60 tablet 11  . mirtazapine (REMERON) 45 MG tablet Take 1 tablet (45 mg total) by mouth at bedtime. 30 tablet 11  . Multiple Vitamin (MULTIVITAMIN) tablet Take 1 tablet by mouth daily.      Marland Kitchen omeprazole (PRILOSEC) 40 MG capsule Take 1 capsule (40 mg total) by mouth daily. 90 capsule 1  . oxyCODONE-acetaminophen (PERCOCET) 10-325 MG tablet Take 0.5-1 tablets by mouth every 6 (six) hours as needed for pain. 30 tablet 0  . pregabalin (LYRICA) 100 MG capsule Take 1 capsule (100 mg total) by mouth 3 (three) times daily. 90 capsule 5  . sertraline (ZOLOFT) 50 MG tablet Take  1 tablet (50 mg total) by mouth daily. --patient has scheduled appt with new pcp-dr Jenna Ardoin- in march/2017 30 tablet 1  . sodium chloride 0.9 % Inject into the vein as needed.    Marland Kitchen tiZANidine (ZANAFLEX) 4 MG tablet Take 1 tablet (4 mg total) by mouth every 8 (eight) hours as needed for muscle spasms. 90 tablet 1  . topiramate (TOPAMAX) 50 MG tablet Take 1 tablet (50 mg total) by mouth 2 (two) times daily.     No current facility-administered medications on file prior to visit.    Past Medical History  Diagnosis Date  . POLYCYSTIC OVARIAN DISEASE   . HYPERLIPIDEMIA   . MIGRAINE HEADACHE   . GERD   . BACK PAIN, CHRONIC   . Palpitations   . DYSAUTONOMIA 2011    "POTS" , recurrent syncope - follows with cards for same  . ANXIETY   . Tear meniscus knee 06/14/2010    right  . POTS (postural orthostatic tachycardia syndrome)     Past Surgical History  Procedure Laterality Date  . Cervical spine  2010    ACDF c5-6  . Tonsillectomy and adenoidectomy    . Lumbar laminectomy  2008    Social History   Social History  . Marital Status: Married    Spouse Name: N/A  . Number of Children: N/A  . Years of Education: N/A   Social History Main Topics  . Smoking status: Former Smoker -- 1.00 packs/day for 10 years    Quit date: 03/25/2005  . Smokeless tobacco: Never Used  . Alcohol Use: 0.0 oz/week    0 Standard drinks or equivalent per week     Comment: occasionally  . Drug Use: No  . Sexual Activity: Not Currently   Other Topics Concern  . Not on file   Social History Narrative   Disability since 10/2012 due to POTS, 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[facility]       Family History  Problem Relation Age of Onset  . Colon cancer Maternal Grandmother 18  . Other Father     MVA  . Heart disease Father   . Heart attack Father   . Bipolar disorder Mother   . Stroke Mother     Age of 36 while on OCP  . Hypertension Sister   . Hypertension Sister   . Migraines Sister   . Cancer Paternal  Grandmother   . Cancer Maternal Grandmother     Review of Systems  Constitutional: Negative for fever and chills.  Respiratory: Positive for shortness of breath (with exertion at times). Negative for cough and wheezing.   Cardiovascular: Positive for  chest pain, palpitations and leg swelling (in up on feet a lot).  Gastrointestinal: Positive for nausea. Negative for abdominal pain.       GERD - controlled with medication  Endocrine:       Temperature regulation issues  Neurological: Positive for dizziness, light-headedness, numbness (numbness/tingling and pain from nerve pain) and headaches.       Objective:   Filed Vitals:   05/29/15 1512  BP: 112/76  Pulse: 90  Temp: 98.4 F (36.9 C)  Resp: 16   Filed Weights   05/29/15 1512  Weight: 221 lb (100.245 kg)   Body mass index is 36.78 kg/(m^2).   Physical Exam Constitutional: Appears well-developed and well-nourished. No distress.  Neck: Neck supple. No tracheal deviation present. No thyromegaly present.  No carotid bruit. No cervical adenopathy.   Cardiovascular: Normal rate, regular rhythm and normal heart sounds.   No murmur heard.  No edema Pulmonary/Chest: Effort normal and breath sounds normal. No respiratory distress. No wheezes.  Psych: normal mood and affect      Assessment & Plan:   See Problem List for Assessment and Plan of chronic medical problems.  Will continue current medications for now.  She will be going through infertility treatments and will need to come off of several medications. She will discuss the timeline with her fertility specialist. I advised that we discontinue one medication at the time, which should be done slowly. She will discuss with her fertility specialist what medications they feel she needs to come off of as well.  She will follow-up with me in 4 months, sooner if needed.

## 2015-06-14 DIAGNOSIS — N8500 Endometrial hyperplasia, unspecified: Secondary | ICD-10-CM | POA: Diagnosis not present

## 2015-06-15 DIAGNOSIS — M6281 Muscle weakness (generalized): Secondary | ICD-10-CM | POA: Diagnosis not present

## 2015-06-15 DIAGNOSIS — M357 Hypermobility syndrome: Secondary | ICD-10-CM | POA: Diagnosis not present

## 2015-06-15 DIAGNOSIS — M5416 Radiculopathy, lumbar region: Secondary | ICD-10-CM | POA: Diagnosis not present

## 2015-06-15 DIAGNOSIS — M62838 Other muscle spasm: Secondary | ICD-10-CM | POA: Diagnosis not present

## 2015-06-15 DIAGNOSIS — R531 Weakness: Secondary | ICD-10-CM | POA: Diagnosis not present

## 2015-06-19 DIAGNOSIS — R531 Weakness: Secondary | ICD-10-CM | POA: Diagnosis not present

## 2015-06-19 DIAGNOSIS — M357 Hypermobility syndrome: Secondary | ICD-10-CM | POA: Diagnosis not present

## 2015-06-19 DIAGNOSIS — M62838 Other muscle spasm: Secondary | ICD-10-CM | POA: Diagnosis not present

## 2015-06-19 DIAGNOSIS — M6281 Muscle weakness (generalized): Secondary | ICD-10-CM | POA: Diagnosis not present

## 2015-06-19 DIAGNOSIS — M5416 Radiculopathy, lumbar region: Secondary | ICD-10-CM | POA: Diagnosis not present

## 2015-06-22 DIAGNOSIS — M5416 Radiculopathy, lumbar region: Secondary | ICD-10-CM | POA: Diagnosis not present

## 2015-06-22 DIAGNOSIS — R531 Weakness: Secondary | ICD-10-CM | POA: Diagnosis not present

## 2015-06-22 DIAGNOSIS — M357 Hypermobility syndrome: Secondary | ICD-10-CM | POA: Diagnosis not present

## 2015-06-22 DIAGNOSIS — M62838 Other muscle spasm: Secondary | ICD-10-CM | POA: Diagnosis not present

## 2015-06-22 DIAGNOSIS — M6281 Muscle weakness (generalized): Secondary | ICD-10-CM | POA: Diagnosis not present

## 2015-06-24 DIAGNOSIS — A609 Anogenital herpesviral infection, unspecified: Secondary | ICD-10-CM

## 2015-06-24 HISTORY — DX: Anogenital herpesviral infection, unspecified: A60.9

## 2015-06-26 DIAGNOSIS — M5416 Radiculopathy, lumbar region: Secondary | ICD-10-CM | POA: Diagnosis not present

## 2015-06-26 DIAGNOSIS — M6281 Muscle weakness (generalized): Secondary | ICD-10-CM | POA: Diagnosis not present

## 2015-06-26 DIAGNOSIS — R531 Weakness: Secondary | ICD-10-CM | POA: Diagnosis not present

## 2015-06-26 DIAGNOSIS — M62838 Other muscle spasm: Secondary | ICD-10-CM | POA: Diagnosis not present

## 2015-06-26 DIAGNOSIS — M357 Hypermobility syndrome: Secondary | ICD-10-CM | POA: Diagnosis not present

## 2015-07-03 DIAGNOSIS — M5416 Radiculopathy, lumbar region: Secondary | ICD-10-CM | POA: Diagnosis not present

## 2015-07-03 DIAGNOSIS — M357 Hypermobility syndrome: Secondary | ICD-10-CM | POA: Diagnosis not present

## 2015-07-03 DIAGNOSIS — R531 Weakness: Secondary | ICD-10-CM | POA: Diagnosis not present

## 2015-07-03 DIAGNOSIS — M62838 Other muscle spasm: Secondary | ICD-10-CM | POA: Diagnosis not present

## 2015-07-03 DIAGNOSIS — M6281 Muscle weakness (generalized): Secondary | ICD-10-CM | POA: Diagnosis not present

## 2015-07-10 ENCOUNTER — Other Ambulatory Visit: Payer: Self-pay | Admitting: Internal Medicine

## 2015-07-10 DIAGNOSIS — M357 Hypermobility syndrome: Secondary | ICD-10-CM | POA: Diagnosis not present

## 2015-07-10 DIAGNOSIS — M62838 Other muscle spasm: Secondary | ICD-10-CM | POA: Diagnosis not present

## 2015-07-10 DIAGNOSIS — M6281 Muscle weakness (generalized): Secondary | ICD-10-CM | POA: Diagnosis not present

## 2015-07-10 DIAGNOSIS — M5416 Radiculopathy, lumbar region: Secondary | ICD-10-CM | POA: Diagnosis not present

## 2015-07-10 DIAGNOSIS — R531 Weakness: Secondary | ICD-10-CM | POA: Diagnosis not present

## 2015-07-10 NOTE — Telephone Encounter (Signed)
Okay to fill Voltaren and Remeron? Last OV was 05/29/15. Last filled by Dr Felicity CoyerLeschber.

## 2015-07-11 NOTE — Telephone Encounter (Signed)
Ok to fill 

## 2015-07-12 ENCOUNTER — Encounter: Payer: Self-pay | Admitting: Gynecology

## 2015-07-12 NOTE — Telephone Encounter (Signed)
Find out why patient needs Diflucan as requested

## 2015-07-13 ENCOUNTER — Ambulatory Visit (INDEPENDENT_AMBULATORY_CARE_PROVIDER_SITE_OTHER): Payer: Medicare Other | Admitting: Gynecology

## 2015-07-13 ENCOUNTER — Encounter: Payer: Self-pay | Admitting: Gynecology

## 2015-07-13 VITALS — BP 112/76

## 2015-07-13 DIAGNOSIS — Z3009 Encounter for other general counseling and advice on contraception: Secondary | ICD-10-CM

## 2015-07-13 DIAGNOSIS — N889 Noninflammatory disorder of cervix uteri, unspecified: Secondary | ICD-10-CM | POA: Diagnosis not present

## 2015-07-13 DIAGNOSIS — B9689 Other specified bacterial agents as the cause of diseases classified elsewhere: Secondary | ICD-10-CM

## 2015-07-13 DIAGNOSIS — N760 Acute vaginitis: Secondary | ICD-10-CM

## 2015-07-13 DIAGNOSIS — A499 Bacterial infection, unspecified: Secondary | ICD-10-CM

## 2015-07-13 DIAGNOSIS — Z113 Encounter for screening for infections with a predominantly sexual mode of transmission: Secondary | ICD-10-CM

## 2015-07-13 LAB — WET PREP FOR TRICH, YEAST, CLUE
TRICH WET PREP: NONE SEEN
Yeast Wet Prep HPF POC: NONE SEEN

## 2015-07-13 MED ORDER — NORETHINDRONE ACET-ETHINYL EST 1-20 MG-MCG PO TABS: 1.0000 | ORAL_TABLET | Freq: Every day | ORAL | Status: DC

## 2015-07-13 MED ORDER — METRONIDAZOLE 500 MG PO TABS: 500.0000 mg | ORAL_TABLET | Freq: Two times a day (BID) | ORAL | Status: DC

## 2015-07-13 NOTE — Patient Instructions (Signed)
Take the Flagyl medication twice daily for 7 days.  Start on the birth control pills as we discussed. Use condoms as backup as the pills may be less effective because of the other medications you're taking.

## 2015-07-13 NOTE — Progress Notes (Signed)
    Kayla ParodyBrandi M Floyd 1980/12/23 [id number]        35 y.o.  G0P0000 Presents with 2 weeks of vaginal discharge and odor. No urinary symptoms such as frequency dysuria or urgency. Has started a new sexual relationship. Also asked about birth control.  Past medical history,surgical history, problem list, medications, allergies, family history and social history were all reviewed and documented in the EPIC chart.  Directed ROS with pertinent positives and negatives documented in the history of present illness/assessment and plan.  Exam: Kennon PortelaKim Gardner assistant Filed Vitals:   07/13/15 1130  BP: 112/76   General appearance:  Normal Abdomen soft nontender without masses guarding rebound Pelvic external BUS vagina with yellow frothy discharge. Cervix with inflammatory changes. Uterus normal size anteverted midline mobile nontender. Adnexa without masses or tenderness.  Assessment/Plan:  35 y.o. G0P0000 with history, exam and wet prep consistent with bacterial vaginosis. Options for treatment reviewed inpatient a left for Flagyl 500 mg twice a day 7 days. Herpes screen of the inflammatory area of the cervix done. GC/chlamydia screen done.  Reviewed contraceptive options with the patient to include pill patch and ring Depo-Provera Nexplanon and IUDs. She did smoke but quit over 10 years ago. After lengthy discussion of the pros and cons of each choice the patient wants to start on oral contraceptives. She understands possible increased thrombosis risk with smoking history and age to include stroke heart attack DVT. Also understands possible decreased efficacy rate with some of the medications she is on.  She is planning to wean from her Topamax. Recommend that she also use backup contraception with condoms regardless which she agrees to do.  Loestrin 1/20 equivalent refill 6 provided. Follow up if any issues once starting.    Dara LordsFONTAINE,Kehaulani Fruin P MD, 12:00 PM 07/13/2015

## 2015-07-14 LAB — GC/CHLAMYDIA PROBE AMP
CT PROBE, AMP APTIMA: NOT DETECTED
GC PROBE AMP APTIMA: NOT DETECTED

## 2015-07-15 LAB — SURESWAB HSV, TYPE 1/2 DNA, PCR
HSV 1 DNA: NOT DETECTED
HSV 2 DNA: DETECTED — AB

## 2015-07-17 ENCOUNTER — Encounter: Payer: Self-pay | Admitting: Gynecology

## 2015-07-17 ENCOUNTER — Other Ambulatory Visit: Payer: Self-pay | Admitting: Gynecology

## 2015-07-17 DIAGNOSIS — R531 Weakness: Secondary | ICD-10-CM | POA: Diagnosis not present

## 2015-07-17 DIAGNOSIS — M62838 Other muscle spasm: Secondary | ICD-10-CM | POA: Diagnosis not present

## 2015-07-17 DIAGNOSIS — M5416 Radiculopathy, lumbar region: Secondary | ICD-10-CM | POA: Diagnosis not present

## 2015-07-17 DIAGNOSIS — M357 Hypermobility syndrome: Secondary | ICD-10-CM | POA: Diagnosis not present

## 2015-07-17 MED ORDER — VALACYCLOVIR HCL 500 MG PO TABS: 500.0000 mg | ORAL_TABLET | Freq: Every day | ORAL | Status: DC

## 2015-07-24 DIAGNOSIS — R531 Weakness: Secondary | ICD-10-CM | POA: Diagnosis not present

## 2015-07-24 DIAGNOSIS — M357 Hypermobility syndrome: Secondary | ICD-10-CM | POA: Diagnosis not present

## 2015-07-24 DIAGNOSIS — M5416 Radiculopathy, lumbar region: Secondary | ICD-10-CM | POA: Diagnosis not present

## 2015-07-24 DIAGNOSIS — M62838 Other muscle spasm: Secondary | ICD-10-CM | POA: Diagnosis not present

## 2015-07-24 DIAGNOSIS — M6281 Muscle weakness (generalized): Secondary | ICD-10-CM | POA: Diagnosis not present

## 2015-07-31 DIAGNOSIS — M6281 Muscle weakness (generalized): Secondary | ICD-10-CM | POA: Diagnosis not present

## 2015-07-31 DIAGNOSIS — M62838 Other muscle spasm: Secondary | ICD-10-CM | POA: Diagnosis not present

## 2015-07-31 DIAGNOSIS — R531 Weakness: Secondary | ICD-10-CM | POA: Diagnosis not present

## 2015-07-31 DIAGNOSIS — M5416 Radiculopathy, lumbar region: Secondary | ICD-10-CM | POA: Diagnosis not present

## 2015-07-31 DIAGNOSIS — M357 Hypermobility syndrome: Secondary | ICD-10-CM | POA: Diagnosis not present

## 2015-08-17 ENCOUNTER — Other Ambulatory Visit: Payer: Self-pay | Admitting: Internal Medicine

## 2015-09-25 ENCOUNTER — Other Ambulatory Visit: Payer: Self-pay | Admitting: Internal Medicine

## 2015-09-29 ENCOUNTER — Ambulatory Visit: Payer: Medicare Other | Admitting: Internal Medicine

## 2015-09-29 DIAGNOSIS — H40053 Ocular hypertension, bilateral: Secondary | ICD-10-CM | POA: Diagnosis not present

## 2015-09-29 DIAGNOSIS — H47323 Drusen of optic disc, bilateral: Secondary | ICD-10-CM | POA: Diagnosis not present

## 2015-09-29 DIAGNOSIS — G43909 Migraine, unspecified, not intractable, without status migrainosus: Secondary | ICD-10-CM | POA: Diagnosis not present

## 2015-10-05 ENCOUNTER — Encounter: Payer: Self-pay | Admitting: Gynecology

## 2015-10-05 ENCOUNTER — Encounter: Payer: Self-pay | Admitting: Internal Medicine

## 2015-10-06 ENCOUNTER — Other Ambulatory Visit: Payer: Self-pay | Admitting: Gynecology

## 2015-10-06 MED ORDER — CLINDAMYCIN PHOSPHATE 2 % VA CREA
1.0000 | TOPICAL_CREAM | Freq: Every day | VAGINAL | Status: DC
Start: 2015-10-06 — End: 2016-08-28

## 2015-10-06 NOTE — Telephone Encounter (Signed)
Recommend Cleocin vaginal cream nightly 7 nights.  Office visit if symptoms persist

## 2015-10-26 DIAGNOSIS — G43709 Chronic migraine without aura, not intractable, without status migrainosus: Secondary | ICD-10-CM | POA: Diagnosis not present

## 2015-11-08 ENCOUNTER — Other Ambulatory Visit: Payer: Self-pay | Admitting: Internal Medicine

## 2015-11-08 ENCOUNTER — Other Ambulatory Visit: Payer: Self-pay | Admitting: Neurology

## 2015-11-09 ENCOUNTER — Other Ambulatory Visit: Payer: Self-pay | Admitting: *Deleted

## 2015-11-09 MED ORDER — PREGABALIN 100 MG PO CAPS: 100.0000 mg | ORAL_CAPSULE | Freq: Two times a day (BID) | ORAL | 5 refills | Status: DC

## 2015-11-09 MED ORDER — PREGABALIN 100 MG PO CAPS: 100.0000 mg | ORAL_CAPSULE | Freq: Three times a day (TID) | ORAL | 5 refills | Status: DC

## 2015-12-08 ENCOUNTER — Encounter: Payer: Self-pay | Admitting: Student

## 2016-02-01 ENCOUNTER — Other Ambulatory Visit: Payer: Self-pay | Admitting: Internal Medicine

## 2016-02-22 ENCOUNTER — Other Ambulatory Visit: Payer: Self-pay | Admitting: Gynecology

## 2016-02-23 NOTE — Telephone Encounter (Signed)
Refill through next annual exam

## 2016-02-23 NOTE — Telephone Encounter (Signed)
You only provided 6 refills when you initially prescribed this for her in April.  She is not due for CE until Feb 2018.

## 2016-03-10 ENCOUNTER — Other Ambulatory Visit: Payer: Self-pay | Admitting: Internal Medicine

## 2016-03-11 ENCOUNTER — Other Ambulatory Visit: Payer: Self-pay

## 2016-03-11 MED ORDER — VALACYCLOVIR HCL 500 MG PO TABS: 500.0000 mg | ORAL_TABLET | Freq: Every day | ORAL | 3 refills | Status: DC

## 2016-04-25 ENCOUNTER — Other Ambulatory Visit: Payer: Self-pay | Admitting: Internal Medicine

## 2016-06-04 ENCOUNTER — Other Ambulatory Visit: Payer: Self-pay

## 2016-06-05 ENCOUNTER — Telehealth: Payer: Self-pay | Admitting: *Deleted

## 2016-06-05 MED ORDER — BACLOFEN 10 MG PO TABS: 10.0000 mg | ORAL_TABLET | Freq: Three times a day (TID) | ORAL | 0 refills | Status: DC | PRN

## 2016-06-05 MED ORDER — MIRTAZAPINE 45 MG PO TABS: 45.0000 mg | ORAL_TABLET | Freq: Every day | ORAL | 0 refills | Status: DC

## 2016-06-05 NOTE — Telephone Encounter (Signed)
Ok - short term -- last seen one year ago -- needs f/u with me

## 2016-06-05 NOTE — Telephone Encounter (Signed)
Rec'd fax pt requesting refills on her Mirtazapine & Baclofen. Is this ok to send...Raechel Chute/lmb

## 2016-06-05 NOTE — Telephone Encounter (Signed)
Sent 30 day w/appt info for future refills

## 2016-06-12 ENCOUNTER — Encounter: Payer: Self-pay | Admitting: Gynecology

## 2016-06-12 ENCOUNTER — Ambulatory Visit (INDEPENDENT_AMBULATORY_CARE_PROVIDER_SITE_OTHER): Payer: Medicare Other | Admitting: Gynecology

## 2016-06-12 VITALS — BP 124/80 | Ht 65.0 in | Wt 194.0 lb

## 2016-06-12 DIAGNOSIS — Z01419 Encounter for gynecological examination (general) (routine) without abnormal findings: Secondary | ICD-10-CM

## 2016-06-12 DIAGNOSIS — A609 Anogenital herpesviral infection, unspecified: Secondary | ICD-10-CM | POA: Diagnosis not present

## 2016-06-12 MED ORDER — VALACYCLOVIR HCL 500 MG PO TABS: 500.0000 mg | ORAL_TABLET | Freq: Every day | ORAL | 4 refills | Status: DC

## 2016-06-12 MED ORDER — NORETHINDRONE ACET-ETHINYL EST 1-20 MG-MCG PO TABS: 1.0000 | ORAL_TABLET | Freq: Every day | ORAL | 4 refills | Status: DC

## 2016-06-12 NOTE — Progress Notes (Signed)
    Kayla ParodyBrandi M Floyd October 30, 1980 [id number]        36 y.o.  G0P0000 for annual exam.    Past medical history,surgical history, problem list, medications, allergies, family history and social history were all reviewed and documented as reviewed in the EPIC chart.  ROS:  Performed with pertinent positives and negatives included in the history, assessment and plan.   Additional significant findings :  None   Exam: Kayla Floyd assistant Vitals:   06/12/16 1529  BP: 124/80  Weight: 194 lb (88 kg)  Height: 5\' 5"  (1.651 m)   Body mass index is 32.28 kg/m.  General appearance:  Normal affect, orientation and appearance. Skin: Grossly normal HEENT: Without gross lesions.  No cervical or supraclavicular adenopathy. Thyroid normal.  Lungs:  Clear without wheezing, rales or rhonchi Cardiac: RR, without RMG Abdominal:  Soft, nontender, without masses, guarding, rebound, organomegaly or hernia Breasts:  Examined lying and sitting without masses, retractions, discharge or axillary adenopathy. Pelvic:  Ext, BUS, Vagina: Normal  Cervix: Normal  Uterus: Anteverted, normal size, shape and contour, midline and mobile nontender   Adnexa: Without masses or tenderness    Anus and perineum: Normal   Rectovaginal: Normal sphincter tone without palpated masses or tenderness.    Assessment/Plan:  36 y.o. G0P0000 female for annual exam with regular menses, oral contraceptives.   1. Oral contraceptives. Patient is on Loestrin 1/20 equivalent. She has she stopped a month ago when she had some breakthrough bleeding that is going to restart them after her upcoming menses. I again reviewed the risks to include increased risk of thrombosis such as stroke heart attack DVT particularly with her smoking history although she has quit over 10 years ago. Also on Topamax and possible decreased efficacy of the contraception discussed. Options to increase the estrogen dose or use backup contraception  regardless of using the pill discussed. Patient's comfortable continuing her lower dose pill and understands and accepts the risk of pregnancy. 2. History of premenopausal breast cancer in her grandmother and breast cancer in her maternal aunt but not sure if premenopausal or not. Issues of genetic linkage possibilities reviewed. Both of these relatives are deceased. Discussed options for genetic counseling and she would like to pursue this. Will refer to genetic counselor. She knows to call if she does not hear from your office within the next several weeks. 3. Pap smear/HPV 04/2015. No Pap smear done today. No history of abnormal Pap smears previously. 4. Mammography never. Reviewed her history and options to start mammography now versus waiting until age 89 reviewed. At this point patient comfortable waiting. Will follow up genetic counselor and reassess after their risk assessment. SBE monthly reviewed. 5. 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 Uses Valtrex 500 mg daily. Increases to twice daily with outbreak. Refill 1 year provided. 6. Health maintenance. No routine blood work done as patient plans to do so at her primary physician's office. Follow up 1 year, sooner as needed.   Dara LordsFONTAINE,Kayla Floyd P MD, 4:02 PM 06/12/2016

## 2016-06-12 NOTE — Patient Instructions (Signed)
Genetic counseling office should call you to arrange for discussion about genetic testing in reference to your family history of breast cancer.

## 2016-06-13 ENCOUNTER — Telehealth: Payer: Self-pay | Admitting: *Deleted

## 2016-06-13 DIAGNOSIS — Z803 Family history of malignant neoplasm of breast: Secondary | ICD-10-CM

## 2016-06-13 NOTE — Telephone Encounter (Signed)
Referral placed at cancer center they will contact pt to schedule. 

## 2016-06-13 NOTE — Telephone Encounter (Signed)
-----   Message from Dara Lordsimothy P Fontaine, MD sent at 06/12/2016  4:35 PM EDT ----- Schedule appointment with Oncology genetic counselor reference paternal grandmother with premenopausal breast cancer and maternal aunt with breast cancer unknown timing.

## 2016-06-25 ENCOUNTER — Other Ambulatory Visit: Payer: Self-pay | Admitting: Internal Medicine

## 2016-06-27 ENCOUNTER — Other Ambulatory Visit: Payer: Self-pay | Admitting: *Deleted

## 2016-07-10 ENCOUNTER — Encounter: Payer: Self-pay | Admitting: Genetic Counselor

## 2016-07-10 NOTE — Telephone Encounter (Signed)
Appointment on 07/29/16 @ 2:00pm

## 2016-07-18 ENCOUNTER — Telehealth: Payer: Self-pay | Admitting: Internal Medicine

## 2016-07-18 NOTE — Telephone Encounter (Signed)
Patient requesting refill on remeron, omeprazol, baclofen, tizanidine, and atenolol.  Patient has scheduled CPE for end of May (next avail).  Patient uses [facility] on [address] in [place].

## 2016-07-19 MED ORDER — MIRTAZAPINE 45 MG PO TABS: 45.0000 mg | ORAL_TABLET | Freq: Every day | ORAL | 0 refills | Status: DC

## 2016-07-19 MED ORDER — OMEPRAZOLE 40 MG PO CPDR: 40.0000 mg | DELAYED_RELEASE_CAPSULE | Freq: Every day | ORAL | 0 refills | Status: DC

## 2016-07-19 MED ORDER — TIZANIDINE HCL 4 MG PO TABS: 4.0000 mg | ORAL_TABLET | Freq: Three times a day (TID) | ORAL | 0 refills | Status: DC | PRN

## 2016-07-19 MED ORDER — BACLOFEN 10 MG PO TABS: 10.0000 mg | ORAL_TABLET | Freq: Three times a day (TID) | ORAL | 0 refills | Status: DC | PRN

## 2016-07-19 MED ORDER — ATENOLOL 25 MG PO TABS: 25.0000 mg | ORAL_TABLET | Freq: Every day | ORAL | 0 refills | Status: DC

## 2016-07-19 NOTE — Telephone Encounter (Signed)
Per office policy will send 30 day script until appt...lmb

## 2016-07-29 ENCOUNTER — Other Ambulatory Visit: Payer: Medicare Other

## 2016-07-29 ENCOUNTER — Encounter: Payer: Medicare Other | Admitting: Genetic Counselor

## 2016-08-02 ENCOUNTER — Encounter: Payer: Self-pay | Admitting: Gynecology

## 2016-08-02 ENCOUNTER — Other Ambulatory Visit: Payer: Self-pay | Admitting: Internal Medicine

## 2016-08-02 NOTE — Telephone Encounter (Signed)
Last refill sent 07/19/16, please advise if okay.

## 2016-08-02 NOTE — Telephone Encounter (Signed)
Has not been seen in over one year - need an appt - refuse

## 2016-08-05 ENCOUNTER — Other Ambulatory Visit: Payer: Self-pay | Admitting: Internal Medicine

## 2016-08-05 NOTE — Telephone Encounter (Signed)
I do not see where I ordered this in the past.

## 2016-08-06 NOTE — Telephone Encounter (Signed)
Pt has an appt on 08/14/16, okay to give refill?

## 2016-08-13 ENCOUNTER — Other Ambulatory Visit: Payer: Self-pay | Admitting: *Deleted

## 2016-08-13 MED ORDER — ATENOLOL 25 MG PO TABS: 25.0000 mg | ORAL_TABLET | Freq: Every day | ORAL | 0 refills | Status: DC

## 2016-08-13 MED ORDER — OMEPRAZOLE 40 MG PO CPDR: 40.0000 mg | DELAYED_RELEASE_CAPSULE | Freq: Every day | ORAL | 0 refills | Status: DC

## 2016-08-13 NOTE — Addendum Note (Signed)
Addended by: Deatra JamesBRAND, LUCY M on: 08/13/2016 12:25 PM   Modules accepted: Orders

## 2016-08-14 ENCOUNTER — Encounter: Payer: Medicare Other | Admitting: Internal Medicine

## 2016-08-15 ENCOUNTER — Other Ambulatory Visit: Payer: Self-pay | Admitting: Emergency Medicine

## 2016-08-15 MED ORDER — OMEPRAZOLE 40 MG PO CPDR: 40.0000 mg | DELAYED_RELEASE_CAPSULE | Freq: Every day | ORAL | 0 refills | Status: DC

## 2016-08-15 MED ORDER — ATENOLOL 25 MG PO TABS: 25.0000 mg | ORAL_TABLET | Freq: Every day | ORAL | 0 refills | Status: DC

## 2016-08-16 ENCOUNTER — Other Ambulatory Visit: Payer: Self-pay | Admitting: Internal Medicine

## 2016-08-21 ENCOUNTER — Ambulatory Visit (HOSPITAL_BASED_OUTPATIENT_CLINIC_OR_DEPARTMENT_OTHER): Payer: Medicare Other | Admitting: Genetic Counselor

## 2016-08-21 ENCOUNTER — Encounter: Payer: Self-pay | Admitting: Genetic Counselor

## 2016-08-21 ENCOUNTER — Other Ambulatory Visit: Payer: Medicare Other

## 2016-08-21 DIAGNOSIS — Z7183 Encounter for nonprocreative genetic counseling: Secondary | ICD-10-CM | POA: Diagnosis not present

## 2016-08-21 DIAGNOSIS — Z800 Family history of malignant neoplasm of digestive organs: Secondary | ICD-10-CM | POA: Diagnosis not present

## 2016-08-21 DIAGNOSIS — Z803 Family history of malignant neoplasm of breast: Secondary | ICD-10-CM | POA: Diagnosis not present

## 2016-08-21 NOTE — Progress Notes (Signed)
REFERRING PROVIDER: Anastasio Auerbach, MD [address], [place] [address]  PRIMARY PROVIDER:  Binnie Rail, MD  PRIMARY REASON FOR VISIT:  1. Family history of colon cancer   2. Family history of breast cancer      HISTORY OF PRESENT ILLNESS:   Kayla Floyd, a 36 y.o. female, was seen for a [facility] cancer genetics consultation at the request of Dr. Phineas Real due to a family history of cancer.  Kayla Floyd presents to clinic today to discuss the possibility of a hereditary predisposition to cancer, genetic testing, and to further clarify her future cancer risks, as well as potential cancer risks for family members.  Kayla Floyd is a 36 y.o. female with no personal history of cancer.  She reports a concern for ehlers-danlos syndrome and would like to have a work up for that.  She also has a family history of breast and colon cancer.  CANCER HISTORY:   No history exists.     HORMONAL RISK FACTORS:  Menarche was at age 28.  First live birth at age N/A.  OCP use for approximately 10-15 years.  Ovaries intact: yes.  Hysterectomy: no.  Menopausal status: premenopausal.  HRT use: 0 years. Colonoscopy: no; not examined. Mammogram within the last year: no. Number of breast biopsies: 0. Up to date with pelvic exams:  yes. Any excessive radiation exposure in the past:  no  Past Medical History:  Diagnosis Date  . ANXIETY   . BACK PAIN, CHRONIC   . DYSAUTONOMIA 2011   "POTS" , recurrent syncope - follows with cards for same  . Family history of breast cancer   . Family history of colon cancer   . GERD   . 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 HYPERLIPIDEMIA   . MIGRAINE HEADACHE   . Palpitations   . POLYCYSTIC OVARIAN DISEASE   . POTS (postural orthostatic tachycardia syndrome)   . Tear meniscus knee 06/14/2010   right    Past Surgical History:  Procedure Laterality Date  . Cervical  spine  2010   ACDF c5-6  . LUMBAR LAMINECTOMY  2008  . TONSILLECTOMY AND ADENOIDECTOMY      Social History   Social History  . Marital status: Single    Spouse name: N/A  . Number of children: N/A  . Years of education: N/A   Social History Main Topics  . Smoking status: Former Smoker    Packs/day: 1.00    Years: 10.00    Quit date: 03/25/2005  . Smokeless tobacco: Never Used  . Alcohol use 0.0 oz/week     Comment: occasionally  . Drug use: No  . Sexual activity: Yes     Comment: 1st intercourse 36 yo-More than 5 partners   Other Topics Concern  . None   Social History Narrative   Disability since 10/2012 due to POTS, 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[employer]        FAMILY HISTORY:  We obtained a detailed, 4-generation family history.  Significant diagnoses are listed below: Family History  Problem Relation Age of Onset  . Bipolar disorder Mother   . Stroke Mother        Age of 58 while on OCP  . Other Father        MVA  . Heart disease Father   . Heart attack Father   . Colon cancer Maternal Grandmother 47  . Cancer Maternal Grandmother   .  Hypertension Sister   . Hypertension Sister   . Migraines Sister   . Breast cancer Paternal Grandmother        dx in her 30s-40s  . Cancer Paternal Uncle        ocular  . Breast cancer Paternal Aunt 60  . Lung cancer Paternal Uncle     The patient does not have children.  She has two sisters who are cancer free.  Her father died in a MVA.  He has four brothers and three sisters.  One sister died of breast cancer in her 70's, a brother died of an ocular cancer, and another brother died of lung cancer.  The patient's paternal grandmother had breast cancer in her 66's-40's.  The patient's mother had cervical cancer.  She had three brothers and two sisters who are cancer free.  Her mother had colon cancer at 10.   Kayla Floyd is unaware of previous family history of genetic testing for hereditary cancer risks. Patient's maternal  ancestors are of Caucasian descent, and paternal ancestors are of Caucasian descent. There is no reported Ashkenazi Jewish ancestry. There is no known consanguinity.  GENETIC COUNSELING ASSESSMENT: Kayla Floyd is a 36 y.o. female with a family history of breast and colon cancer which is somewhat suggestive of a hereditary cancer syndrome and predisposition to cancer. We, therefore, discussed and recommended the following at today's visit.   DISCUSSION: We disucssed that about 5-10% of breast cancer is hereditary with most cases due to BRCA mutations.  BRCA mutations increase the risk for breast cancer from a population risk of 12.5% up to an 84-87% lifetime risk.  Moderate risk genes that can be associated with an increased risk for breast cancer include ATM, CHEK2 and PALB2.  We reviewed the characteristics, features and inheritance patterns of hereditary cancer syndromes. We also discussed genetic testing, including the appropriate family members to test, the process of testing, insurance coverage and turn-around-time for results. We discussed the implications of a negative, positive and/or variant of uncertain significant result. We recommended Kayla Floyd pursue genetic testing for the Common Hereditary Cancer gene panel. The Hereditary Gene Panel offered by [facility] includes sequencing and/or deletion duplication testing of the following 46 genes: APC, ATM, AXIN2, BARD1, BMPR1A, BRCA1, BRCA2, BRIP1, CDH1, CDKN2A (p14ARF), CDKN2A (p16INK4a), CHEK2, CTNNA1, DICER1, EPCAM (Deletion/duplication testing only), GREM1 (promoter region deletion/duplication testing only), KIT, MEN1, MLH1, MSH2, MSH3, MSH6, MUTYH, NBN, NF1, NHTL1, PALB2, PDGFRA, PMS2, POLD1, POLE, PTEN, RAD50, RAD51C, RAD51D, SDHB, SDHC, SDHD, SMAD4, SMARCA4. STK11, TP53, TSC1, TSC2, and VHL.  The following genes were evaluated for sequence changes only: SDHA and HOXB13 c.251G>A variant only.  A discussion of the Genetic  Information Non-Discrimination Act (GINA) was performed.  We reviewed that GINA provides protection against genetic discrimination based on employment and health insurance, but does not protect against life, long term care or disability insurance.  She stated that she is not able to get life insurance based on her many health issues, so she is not concerned.  Based on Kayla Floyd's family history of cancer, she meets medical criteria for genetic testing. Despite that she meets criteria, she may still have an out of pocket cost. We discussed that if her out of pocket cost for testing is over $100, the laboratory will call and confirm whether she wants to proceed with testing.  If the out of pocket cost of testing is less than $100 she will be billed by the genetic testing laboratory.   In  order to estimate her chance of having a BRCA mutation, we used statistical models (BRCAPro, PENN II and Tyrer [place]) and laboratory data that take into account her personal medical history, family history and ancestry.  Because each model is different, there can be a lot of variability in the risks they give.  Therefore, these numbers must be considered a rough range and not a precise risk of having a BRCA mutation.  These models estimate that she has approximately a 1.46-4.0% chance of having a mutation.    Based on the patient's personal and family history, statistical models (Tyrer [place] and [place])  and literature data were used to estimate her risk of developing breast cancer. These estimate her lifetime risk of developing breast cancer to be approximately 11.35% to 18.9%. This estimation does not take into account any genetic testing results.  The patient's lifetime breast cancer risk is a preliminary estimate based on available information using one of several models endorsed by the [place] (ACS). The ACS recommends consideration of breast MRI screening as an adjunct to mammography for  patients at high risk (defined as 20% or greater lifetime risk). A more detailed breast cancer risk assessment can be considered, if clinically indicated.      PLAN: After considering the risks, benefits, and limitations, Kayla Floyd  provided informed consent to pursue genetic testing and the blood sample was sent to [facility] for analysis of the Common Hereditary Cancer Panel. Results should be available within approximately 2-3 weeks' time, at which point they will be disclosed by telephone to Kayla Floyd, as will any additional recommendations warranted by these results. Kayla Floyd will receive a summary of her genetic counseling visit and a copy of her results once available. This information will also be available in Epic. We encouraged Kayla Floyd to remain in contact with cancer genetics annually so that we can continuously update the family history and inform her of any changes in cancer genetics and testing that may be of benefit for her family. Kayla Floyd's questions were answered to her satisfaction today. Our contact information was provided should additional questions or concerns arise.  We discussed that our program assesses risk for hereditary cancer syndromes, but cannot perform an assessment for Ehlers-Danlos Syndrome.  She would need to have an evaluation by a genetics team, which could be performed at [facility] or [facility].  The phone number to the [facility] adult genetics program is [phone].  Lastly, we encouraged Kayla Floyd-Burnette to remain in contact with cancer genetics annually so that we can continuously update the family history and inform her of any changes in cancer genetics and testing that may be of benefit for this family.   Ms.  Bolin-Burnette's questions were answered to her satisfaction today. Our contact information was provided should additional questions or concerns arise. Thank you for the  referral and allowing [place] to share in the care of your patient.   Karen P. Florene Glen, [place], [facility] Certified Genetic Counselor [email or web address] phone: [phone]  The patient was seen for a total of 60 minutes in face-to-face genetic counseling.  This patient was discussed with Drs. Magrinat, Lindi Adie and/or Burr Medico who agrees with the above.    _______________________________________________________________________ For Office Staff:  Number of people involved in session: 1 Was an Intern/ student involved with case: no

## 2016-08-28 ENCOUNTER — Other Ambulatory Visit (INDEPENDENT_AMBULATORY_CARE_PROVIDER_SITE_OTHER): Payer: Medicare Other

## 2016-08-28 ENCOUNTER — Encounter: Payer: Self-pay | Admitting: Internal Medicine

## 2016-08-28 ENCOUNTER — Ambulatory Visit (INDEPENDENT_AMBULATORY_CARE_PROVIDER_SITE_OTHER): Payer: Medicare Other | Admitting: Internal Medicine

## 2016-08-28 VITALS — BP 104/76 | HR 104 | Temp 97.8°F | Resp 16 | Wt 199.0 lb

## 2016-08-28 DIAGNOSIS — F329 Major depressive disorder, single episode, unspecified: Secondary | ICD-10-CM | POA: Insufficient documentation

## 2016-08-28 DIAGNOSIS — Z0000 Encounter for general adult medical examination without abnormal findings: Secondary | ICD-10-CM

## 2016-08-28 DIAGNOSIS — Z0001 Encounter for general adult medical examination with abnormal findings: Secondary | ICD-10-CM

## 2016-08-28 DIAGNOSIS — G43709 Chronic migraine without aura, not intractable, without status migrainosus: Secondary | ICD-10-CM

## 2016-08-28 DIAGNOSIS — I10 Essential (primary) hypertension: Secondary | ICD-10-CM | POA: Diagnosis not present

## 2016-08-28 DIAGNOSIS — R7989 Other specified abnormal findings of blood chemistry: Secondary | ICD-10-CM

## 2016-08-28 DIAGNOSIS — F419 Anxiety disorder, unspecified: Secondary | ICD-10-CM

## 2016-08-28 DIAGNOSIS — R739 Hyperglycemia, unspecified: Secondary | ICD-10-CM | POA: Diagnosis not present

## 2016-08-28 DIAGNOSIS — R350 Frequency of micturition: Secondary | ICD-10-CM | POA: Insufficient documentation

## 2016-08-28 DIAGNOSIS — G901 Familial dysautonomia [Riley-Day]: Secondary | ICD-10-CM

## 2016-08-28 DIAGNOSIS — F3289 Other specified depressive episodes: Secondary | ICD-10-CM | POA: Diagnosis not present

## 2016-08-28 DIAGNOSIS — K219 Gastro-esophageal reflux disease without esophagitis: Secondary | ICD-10-CM

## 2016-08-28 DIAGNOSIS — E782 Mixed hyperlipidemia: Secondary | ICD-10-CM | POA: Diagnosis not present

## 2016-08-28 DIAGNOSIS — F32A Depression, unspecified: Secondary | ICD-10-CM | POA: Insufficient documentation

## 2016-08-28 DIAGNOSIS — G909 Disorder of the autonomic nervous system, unspecified: Secondary | ICD-10-CM | POA: Diagnosis not present

## 2016-08-28 LAB — COMPREHENSIVE METABOLIC PANEL
ALK PHOS: 40 U/L (ref 39–117)
ALT: 16 U/L (ref 0–35)
AST: 24 U/L (ref 0–37)
Albumin: 4.2 g/dL (ref 3.5–5.2)
BILIRUBIN TOTAL: 0.3 mg/dL (ref 0.2–1.2)
BUN: 10 mg/dL (ref 6–23)
CALCIUM: 9.5 mg/dL (ref 8.4–10.5)
CO2: 24 meq/L (ref 19–32)
CREATININE: 0.71 mg/dL (ref 0.40–1.20)
Chloride: 106 mEq/L (ref 96–112)
GFR: 98.64 mL/min (ref >60.00)
GLUCOSE: 99 mg/dL (ref 70–99)
Potassium: 4.2 mEq/L (ref 3.5–5.1)
Sodium: 136 mEq/L (ref 135–145)
TOTAL PROTEIN: 7.5 g/dL (ref 6.0–8.3)

## 2016-08-28 LAB — LIPID PANEL
Cholesterol: 310 mg/dL — ABNORMAL HIGH (ref 0–200)
HDL: 35.7 mg/dL — ABNORMAL LOW
Total CHOL/HDL Ratio: 9
Triglycerides: 1219 mg/dL — ABNORMAL HIGH (ref 0.0–149.0)

## 2016-08-28 LAB — CBC WITH DIFFERENTIAL/PLATELET
Basophils Absolute: 0 K/uL (ref 0.0–0.1)
Basophils Relative: 0.7 % (ref 0.0–3.0)
Eosinophils Absolute: 0.1 K/uL (ref 0.0–0.7)
Eosinophils Relative: 1.9 % (ref 0.0–5.0)
HCT: 31.3 % — ABNORMAL LOW (ref 36.0–46.0)
Hemoglobin: 10.6 g/dL — ABNORMAL LOW (ref 12.0–15.0)
Lymphocytes Relative: 38.2 % (ref 12.0–46.0)
Lymphs Abs: 2.6 K/uL (ref 0.7–4.0)
MCHC: 33.8 g/dL (ref 30.0–36.0)
MCV: 79.7 fl (ref 78.0–100.0)
Monocytes Absolute: 0.4 K/uL (ref 0.1–1.0)
Monocytes Relative: 5.4 % (ref 3.0–12.0)
Neutro Abs: 3.7 K/uL (ref 1.4–7.7)
Neutrophils Relative %: 53.8 % (ref 43.0–77.0)
Platelets: 258 K/uL (ref 150.0–400.0)
RBC: 3.93 Mil/uL (ref 3.87–5.11)
RDW: 14.9 % (ref 11.5–15.5)
WBC: 6.8 K/uL (ref 4.0–10.5)

## 2016-08-28 LAB — LDL CHOLESTEROL, DIRECT: Direct LDL: 101 mg/dL

## 2016-08-28 LAB — HEMOGLOBIN A1C: Hgb A1c MFr Bld: 5.7 % (ref 4.6–6.5)

## 2016-08-28 LAB — TSH: TSH: 1.17 u[IU]/mL (ref 0.35–4.50)

## 2016-08-28 MED ORDER — DULOXETINE HCL 30 MG PO CPEP: 30.0000 mg | ORAL_CAPSULE | Freq: Every day | ORAL | 5 refills | Status: DC

## 2016-08-28 MED ORDER — OMEPRAZOLE 40 MG PO CPDR: 40.0000 mg | DELAYED_RELEASE_CAPSULE | Freq: Every day | ORAL | 3 refills | Status: DC

## 2016-08-28 MED ORDER — ATENOLOL 25 MG PO TABS: 25.0000 mg | ORAL_TABLET | Freq: Every day | ORAL | 3 refills | Status: DC

## 2016-08-28 MED ORDER — DICLOFENAC SODIUM 75 MG PO TBEC: 75.0000 mg | DELAYED_RELEASE_TABLET | Freq: Two times a day (BID) | ORAL | 5 refills | Status: DC

## 2016-08-28 MED ORDER — TIZANIDINE HCL 4 MG PO TABS: 4.0000 mg | ORAL_TABLET | Freq: Three times a day (TID) | ORAL | 2 refills | Status: DC | PRN

## 2016-08-28 MED ORDER — ATORVASTATIN CALCIUM 10 MG PO TABS: 10.0000 mg | ORAL_TABLET | Freq: Every day | ORAL | 3 refills | Status: DC

## 2016-08-28 MED ORDER — MIRTAZAPINE 45 MG PO TABS: 45.0000 mg | ORAL_TABLET | Freq: Every day | ORAL | 5 refills | Status: DC

## 2016-08-28 NOTE — Assessment & Plan Note (Signed)
Not controlled Currently on Remeron and sertraline and tolerating both well She does not think she can go without Remeron because it does help her sleep although her sleep is still not controlled Continue Remeron at current dose Trial of Cymbalta 30 mg daily-need to monitor for serotonin syndrome Discontinue sertraline May need to see psychiatry in the future

## 2016-08-28 NOTE — Assessment & Plan Note (Signed)
Check A1c. 

## 2016-08-28 NOTE — Assessment & Plan Note (Signed)
?   UTI Urinalysis, urine culture

## 2016-08-28 NOTE — Patient Instructions (Signed)
Test(s) ordered today. Your results will be released to [place] (or called to you) after review, usually within 72hours after test completion. If any changes need to be made, you will be notified at that same time.  All other Health Maintenance issues reviewed.   All recommended immunizations and age-appropriate screenings are up-to-date or discussed.  No immunizations administered today.   Medications reviewed and updated.  Changes include stopping the sertraline and starting the cymbalta.   Your prescription(s) have been submitted to your pharmacy. Please take as directed and contact our office if you believe you are having problem(s) with the medication(s).   Please followup in 2 months   Health Maintenance, Female Adopting a healthy lifestyle and getting preventive care can go a long way to promote health and wellness. Talk with your health care provider about what schedule of regular examinations is right for you. This is a good chance for you to check in with your provider about disease prevention and staying healthy. In between checkups, there are plenty of things you can do on your own. Experts have done a lot of research about which lifestyle changes and preventive measures are most likely to keep you healthy. Ask your health care provider for more information. Weight and diet Eat a healthy diet  Be sure to include plenty of vegetables, fruits, low-fat dairy products, and lean protein.  Do not eat a lot of foods high in solid fats, added sugars, or salt.  Get regular exercise. This is one of the most important things you can do for your health. ? Most adults should exercise for at least 150 minutes each week. The exercise should increase your heart rate and make you sweat (moderate-intensity exercise). ? Most adults should also do strengthening exercises at least twice a week. This is in addition to the moderate-intensity exercise.  Maintain a healthy weight  Body mass index  (BMI) is a measurement that can be used to identify possible weight problems. It estimates body fat based on height and weight. Your health care provider can help determine your BMI and help you achieve or maintain a healthy weight.  For females 38 years of age and older: ? A BMI below 18.5 is considered underweight. ? A BMI of 18.5 to 24.9 is normal. ? A BMI of 25 to 29.9 is considered overweight. ? A BMI of 30 and above is considered obese.  Watch levels of cholesterol and blood lipids  You should start having your blood tested for lipids and cholesterol at 37 years of age, then have this test every 5 years.  You may need to have your cholesterol levels checked more often if: ? Your lipid or cholesterol levels are high. ? You are older than 37 years of age. ? You are at high risk for heart disease.  Cancer screening Lung Cancer  Lung cancer screening is recommended for adults 1-70 years old who are at high risk for lung cancer because of a history of smoking.  A yearly low-dose CT scan of the lungs is recommended for people who: ? Currently smoke. ? Have quit within the past 15 years. ? Have at least a 30-pack-year history of smoking. A pack year is smoking an average of one pack of cigarettes a day for 1 year.  Yearly screening should continue until it has been 15 years since you quit.  Yearly screening should stop if you develop a health problem that would prevent you from having lung cancer treatment.  Breast Cancer  Practice breast self-awareness. This means understanding how your breasts normally appear and feel.  It also means doing regular breast self-exams. Let your health care provider know about any changes, no matter how small.  If you are in your 20s or 30s, you should have a clinical breast exam (CBE) by a health care provider every 1-3 years as part of a regular health exam.  If you are 40 or older, have a CBE every year. Also consider having a breast X-ray  (mammogram) every year.  If you have a family history of breast cancer, talk to your health care provider about genetic screening.  If you are at high risk for breast cancer, talk to your health care provider about having an MRI and a mammogram every year.  Breast cancer gene (BRCA) assessment is recommended for women who have family members with BRCA-related cancers. BRCA-related cancers include: ? Breast. ? Ovarian. ? Tubal. ? Peritoneal cancers.  Results of the assessment will determine the need for genetic counseling and BRCA1 and BRCA2 testing.  Cervical Cancer Your health care provider may recommend that you be screened regularly for cancer of the pelvic organs (ovaries, uterus, and vagina). This screening involves a pelvic examination, including checking for microscopic changes to the surface of your cervix (Pap test). You may be encouraged to have this screening done every 3 years, beginning at age 21.  For women ages 30-65, health care providers may recommend pelvic exams and Pap testing every 3 years, or they may recommend the Pap and pelvic exam, combined with testing for human papilloma virus (HPV), every 5 years. Some types of HPV increase your risk of cervical cancer. Testing for HPV may also be done on women of any age with unclear Pap test results.  Other health care providers may not recommend any screening for nonpregnant women who are considered low risk for pelvic cancer and who do not have symptoms. Ask your health care provider if a screening pelvic exam is right for you.  If you have had past treatment for cervical cancer or a condition that could lead to cancer, you need Pap tests and screening for cancer for at least 20 years after your treatment. If Pap tests have been discontinued, your risk factors (such as having a new sexual partner) need to be reassessed to determine if screening should resume. Some women have medical problems that increase the chance of getting  cervical cancer. In these cases, your health care provider may recommend more frequent screening and Pap tests.  Colorectal Cancer  This type of cancer can be detected and often prevented.  Routine colorectal cancer screening usually begins at 37 years of age and continues through 37 years of age.  Your health care provider may recommend screening at an earlier age if you have risk factors for colon cancer.  Your health care provider may also recommend using home test kits to check for hidden blood in the stool.  A small camera at the end of a tube can be used to examine your colon directly (sigmoidoscopy or colonoscopy). This is done to check for the earliest forms of colorectal cancer.  Routine screening usually begins at age 50.  Direct examination of the colon should be repeated every 5-10 years through 37 years of age. However, you may need to be screened more often if early forms of precancerous polyps or small growths are found.  Skin Cancer  Check your skin from head to toe regularly.  Tell your health care   provider about any new moles or changes in moles, especially if there is a change in a mole's shape or color.  Also tell your health care provider if you have a mole that is larger than the size of a pencil eraser.  Always use sunscreen. Apply sunscreen liberally and repeatedly throughout the day.  Protect yourself by wearing long sleeves, pants, a wide-brimmed hat, and sunglasses whenever you are outside.  Heart disease, diabetes, and high blood pressure  High blood pressure causes heart disease and increases the risk of stroke. High blood pressure is more likely to develop in: ? People who have blood pressure in the high end of the normal range (130-139/85-89 mm Hg). ? People who are overweight or obese. ? People who are African American.  If you are 18-39 years of age, have your blood pressure checked every 3-5 years. If you are 40 years of age or older, have your  blood pressure checked every year. You should have your blood pressure measured twice-once when you are at a hospital or clinic, and once when you are not at a hospital or clinic. Record the average of the two measurements. To check your blood pressure when you are not at a hospital or clinic, you can use: ? An automated blood pressure machine at a pharmacy. ? A home blood pressure monitor.  If you are between 55 years and 79 years old, ask your health care provider if you should take aspirin to prevent strokes.  Have regular diabetes screenings. This involves taking a blood sample to check your fasting blood sugar level. ? If you are at a normal weight and have a low risk for diabetes, have this test once every three years after 37 years of age. ? If you are overweight and have a high risk for diabetes, consider being tested at a younger age or more often. Preventing infection Hepatitis B  If you have a higher risk for hepatitis B, you should be screened for this virus. You are considered at high risk for hepatitis B if: ? You were born in a country where hepatitis B is common. Ask your health care provider which countries are considered high risk. ? Your parents were born in a high-risk country, and you have not been immunized against hepatitis B (hepatitis B vaccine). ? You have HIV or AIDS. ? You use needles to inject street drugs. ? You live with someone who has hepatitis B. ? You have had sex with someone who has hepatitis B. ? You get hemodialysis treatment. ? You take certain medicines for conditions, including cancer, organ transplantation, and autoimmune conditions.  Hepatitis C  Blood testing is recommended for: ? Everyone born from 1945 through 1965. ? Anyone with known risk factors for hepatitis C.  Sexually transmitted infections (STIs)  You should be screened for sexually transmitted infections (STIs) including gonorrhea and chlamydia if: ? You are sexually active and  are younger than 37 years of age. ? You are older than 37 years of age and your health care provider tells you that you are at risk for this type of infection. ? Your sexual activity has changed since you were last screened and you are at an increased risk for chlamydia or gonorrhea. Ask your health care provider if you are at risk.  If you do not have HIV, but are at risk, it may be recommended that you take a prescription medicine daily to prevent HIV infection. This is called pre-exposure prophylaxis (PrEP). You   are considered at risk if: ? You are sexually active and do not regularly use condoms or know the HIV status of your partner(s). ? You take drugs by injection. ? You are sexually active with a partner who has HIV.  Talk with your health care provider about whether you are at high risk of being infected with HIV. If you choose to begin PrEP, you should first be tested for HIV. You should then be tested every 3 months for as long as you are taking PrEP. Pregnancy  If you are premenopausal and you may become pregnant, ask your health care provider about preconception counseling.  If you may become pregnant, take 400 to 800 micrograms (mcg) of folic acid every day.  If you want to prevent pregnancy, talk to your health care provider about birth control (contraception). Osteoporosis and menopause  Osteoporosis is a disease in which the bones lose minerals and strength with aging. This can result in serious bone fractures. Your risk for osteoporosis can be identified using a bone density scan.  If you are 65 years of age or older, or if you are at risk for osteoporosis and fractures, ask your health care provider if you should be screened.  Ask your health care provider whether you should take a calcium or vitamin D supplement to lower your risk for osteoporosis.  Menopause may have certain physical symptoms and risks.  Hormone replacement therapy may reduce some of these symptoms and  risks. Talk to your health care provider about whether hormone replacement therapy is right for you. Follow these instructions at home:  Schedule regular health, dental, and eye exams.  Stay current with your immunizations.  Do not use any tobacco products including cigarettes, chewing tobacco, or electronic cigarettes.  If you are pregnant, do not drink alcohol.  If you are breastfeeding, limit how much and how often you drink alcohol.  Limit alcohol intake to no more than 1 drink per day for nonpregnant women. One drink equals 12 ounces of beer, 5 ounces of wine, or 1 ounces of hard liquor.  Do not use street drugs.  Do not share needles.  Ask your health care provider for help if you need support or information about quitting drugs.  Tell your health care provider if you often feel depressed.  Tell your health care provider if you have ever been abused or do not feel safe at home. This information is not intended to replace advice given to you by your health care provider. Make sure you discuss any questions you have with your health care provider. Document Released: 09/24/2010 Document Revised: 08/17/2015 Document Reviewed: 12/13/2014 Elsevier Interactive Patient Education  2018 [employer]vier Inc[employer]  

## 2016-08-28 NOTE — Assessment & Plan Note (Signed)
Controlled, but needs medication daily

## 2016-08-28 NOTE — Assessment & Plan Note (Signed)
Not ideally controlled Discontinue sertraline and start Cymbalta Has alprazolam to use as needed, but does not take it often

## 2016-08-28 NOTE — Assessment & Plan Note (Signed)
Management per cardiology She is not wearing compression socks and is not exercising regularly-encouraged both Once to go back to doing physical therapy-I can refer if she would like Currently in disability, since 2013

## 2016-08-28 NOTE — Progress Notes (Signed)
Subjective:    Patient ID: Kayla Floyd, female    DOB: 04/24/1980, 36 y.o.   MRN: [id number]  HPI She is here for a physical exam.   Depression:  She has been taking Sertraline 100 mg for while.  She did not feel the 50 mg was working and increased herself to 100 mg. It has helped a little, but she still feels depressed.     She just had genetic testing for cancer through her gyn.  She is still waiting for the results. She is hoping to get tested for an erlos danlos syndrome, but needs to see a geneticist that that there is no one in the area that does.  She still has difficulty sleeping.  She is taking melatonin 3 mg nightly.  She is taking the remeron 45 mg nightly. She has difficulty falling asleep and if she does not fall sleep until later sleep in later. If she does not take the Remeron she is not able to sleep at all. She just recently added the melatonin and is unsure how much it is helping.  Chronic muscle pain in back and neck: She takes baclofen twice daily every day. When the pain is severe she will use the tizanidine, but she will only use this as needed. She will only take in the afternoon so she is not taking it on top of the baclofen.    ? UTI:  She thinks she has a UTI.  She has increased urinary freuqency and dysuria.  She wondered about a urinalysis.  Dysautonomia: She is following with cardiology. Does not wear compression socks.  She is exercising minimally.  Medications and allergies reviewed with patient and updated if appropriate.  Patient Active Problem List   Diagnosis Date Noted  . Family history of colon cancer   . Family history of breast cancer   . Hyperglycemia 03/15/2015  . Obese 03/15/2015  . Chronic migraine without aura without status migrainosus, not intractable 06/20/2014  . Tear meniscus knee 06/14/2010  . Dysautonomia - POTS 03/20/2010  . Anxiety 07/30/2009  . Migraine 07/28/2009  . Essential hypertension 07/28/2009  . SYNCOPE  AND COLLAPSE 07/10/2009  . PALPITATIONS 07/10/2009  . POLYCYSTIC OVARIAN DISEASE 08/31/2008  . Hypercholesterolemia with hypertriglyceridemia 08/31/2008  . GERD 08/31/2008  . Chronic back pain 08/31/2008    Current Outpatient Prescriptions on File Prior to Visit  Medication Sig Dispense Refill  . ALPRAZolam (XANAX) 0.5 MG tablet Take 1 tablet (0.5 mg total) by mouth 2 (two) times daily as needed for anxiety. 60 tablet 5  . atenolol (TENORMIN) 25 MG tablet Take 1 tablet (25 mg total) by mouth daily. Must keep June 6 appt for future refills 90 tablet 0  . atorvastatin (LIPITOR) 10 MG tablet TAKE 1 TABLET BY MOUTH ONCE DAILY 90 tablet 1  . baclofen (LIORESAL) 10 MG tablet TAKE 1 TABLET BY MOUTH 3 TIMES A DAY AS NEEDED FOR MUSCLE SPASM 30 tablet 0  . botulinum toxin Type A (BOTOX) 100 UNITS SOLR injection Inject 100 Units into the muscle every 3 (three) months. Every 12 weeks at [facility] 2 vial   . cetirizine (ZYRTEC) 10 MG tablet Take 10 mg by mouth daily. Reported on 07/13/2015    . clindamycin (CLEOCIN) 2 % vaginal cream Place 1 Applicatorful vaginally at bedtime. 40 g 0  . diclofenac (VOLTAREN) 75 MG EC tablet TAKE 1 TABLET BY MOUTH TWICE DAILY 60 tablet 5  . metFORMIN (GLUCOPHAGE) 1000 MG tablet  Take 1,000 mg by mouth 2 (two) times daily with a meal.    . metroNIDAZOLE (FLAGYL) 500 MG tablet Take 1 tablet (500 mg total) by mouth 2 (two) times daily. For 7 days.  Avoid alcohol while taking 14 tablet 0  . mirtazapine (REMERON) 45 MG tablet Take 1 tablet (45 mg total) by mouth at bedtime. Must keep June appt for future refills 30 tablet 0  . Multiple Vitamin (MULTIVITAMIN) tablet Take 1 tablet by mouth daily.      . norethindrone-ethinyl estradiol (MICROGESTIN) 1-20 MG-MCG tablet Take 1 tablet by mouth daily. 3 Package 4  . omeprazole (PRILOSEC) 40 MG capsule Take 1 capsule (40 mg total) by mouth daily. Must keep 6/6 appt for future refills 90 capsule 0  . oxyCODONE-acetaminophen  (PERCOCET) 10-325 MG tablet Take 0.5-1 tablets by mouth every 6 (six) hours as needed for pain. 30 tablet 0  . pregabalin (LYRICA) 100 MG capsule Take 1 capsule (100 mg total) by mouth 3 (three) times daily. (Patient taking differently: Take 150 mg by mouth 2 (two) times daily. ) 90 capsule 5  . sertraline (ZOLOFT) 50 MG tablet Take 1 tablet (50 mg total) by mouth daily. 30 tablet 1  . sodium chloride 0.9 % Inject into the vein as needed.    Marland Kitchen. tiZANidine (ZANAFLEX) 4 MG tablet Take 1 tablet (4 mg total) by mouth every 8 (eight) hours as needed for muscle spasms. Must keep May appt for future refills 30 tablet 0  . topiramate (TOPAMAX) 50 MG tablet Take 1 tablet (50 mg total) by mouth 2 (two) times daily.    . valACYclovir (VALTREX) 500 MG tablet Take 1 tablet (500 mg total) by mouth daily. 90 tablet 4   No current facility-administered medications on file prior to visit.     Past Medical History:  Diagnosis Date  . ANXIETY   . BACK PAIN, CHRONIC   . DYSAUTONOMIA 2011   "POTS" , recurrent syncope - follows with cards for same  . Family history of breast cancer   . Family history of colon cancer   . GERD   . 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 HYPERLIPIDEMIA   . MIGRAINE HEADACHE   . Palpitations   . POLYCYSTIC OVARIAN DISEASE   . POTS (postural orthostatic tachycardia syndrome)   . Tear meniscus knee 06/14/2010   right    Past Surgical History:  Procedure Laterality Date  . Cervical spine  2010   ACDF c5-6  . LUMBAR LAMINECTOMY  2008  . TONSILLECTOMY AND ADENOIDECTOMY      Social History   Social History  . Marital status: Single    Spouse name: N/A  . Number of children: N/A  . Years of education: N/A   Social History Main Topics  . Smoking status: Former Smoker    Packs/day: 1.00    Years: 10.00    Quit date: 03/25/2005  . Smokeless tobacco: Never Used  . Alcohol use 0.0 oz/week     Comment: occasionally  . Drug use: No  .  Sexual activity: Yes     Comment: 1st intercourse 36 yo-More than 5 partners   Other Topics Concern  . Not on file   Social History Narrative   Disability since 10/2012 due to POTS, 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[employer]       Family History  Problem Relation Age of Onset  . Bipolar disorder Mother   . Stroke Mother  Age of 98 while on OCP  . Other Father        MVA  . Heart disease Father   . Heart attack Father   . Colon cancer Maternal Grandmother 60  . Cancer Maternal Grandmother   . Hypertension Sister   . Hypertension Sister   . Migraines Sister   . Breast cancer Paternal Grandmother        dx in her 30s-40s  . Cancer Paternal Uncle        ocular  . Breast cancer Paternal Aunt 56  . Lung cancer Paternal Uncle     Review of Systems  Constitutional: Negative for chills and fever.  Eyes: Positive for visual disturbance.  Respiratory: Positive for shortness of breath (with activity). Negative for cough and wheezing.   Cardiovascular: Positive for chest pain (with PVC), palpitations (sporadic, improved with rest) and leg swelling.  Gastrointestinal: Positive for abdominal distention (related to dysautonomia - gastroparesis), constipation, diarrhea and nausea. Negative for blood in stool.       Gerd controlled  Genitourinary: Positive for dysuria and frequency. Negative for hematuria.  Musculoskeletal: Positive for arthralgias, back pain and neck pain.  Skin: Negative for color change and rash.       Easy skin breakdown for the past 6 months  Neurological: Positive for light-headedness, numbness and headaches.  Psychiatric/Behavioral: Positive for dysphoric mood. The patient is nervous/anxious.        Objective:   Vitals:   08/28/16 1457  BP: 104/76  Pulse: (!) 104  Resp: 16  Temp: 97.8 F (36.6 C)   Filed Weights   08/28/16 1457  Weight: 199 lb (90.3 kg)   Body mass index is 33.12 kg/m.  Wt Readings from Last 3 Encounters:  08/28/16 199 lb (90.3 kg)    06/12/16 194 lb (88 kg)  05/29/15 221 lb (100.2 kg)     Physical Exam Constitutional: She appears well-developed and well-nourished. No distress.  HENT:  Head: Normocephalic and atraumatic.  Right Ear: External ear normal. Normal ear canal and TM Left Ear: External ear normal.  Normal ear canal and TM Mouth/Throat: Oropharynx is clear and moist.  Eyes: Conjunctivae and EOM are normal.  Neck: Neck supple. No tracheal deviation present. No thyromegaly present.  No carotid bruit  Cardiovascular: Normal rate, regular rhythm and normal heart sounds.   No murmur heard.  Trace b/l LE edema. Pulmonary/Chest: Effort normal and breath sounds normal. No respiratory distress. She has no wheezes. She has no rales.  Breast: deferred to Gyn Abdominal: Soft. She exhibits no distension. There is no tenderness.  Lymphadenopathy: She has no cervical adenopathy.  Skin: Skin is warm and dry. She is not diaphoretic.  Psychiatric: She has a normal mood and affect. Her behavior is normal.         Assessment & Plan:   Physical exam: Screening blood work  Ordered Immunizations  Up to date  Gyn  Up to date  Eye exam:  Up to date  Exercise-encouraged regular exercise Weight-advised weight loss Skin -no obvious skin abnormalities seen Substance abuse  none  See Problem List for Assessment and Plan of chronic medical problems.  Follow-up in 2 months for changes in medication

## 2016-08-28 NOTE — Assessment & Plan Note (Signed)
Continue statin. 

## 2016-08-28 NOTE — Assessment & Plan Note (Signed)
Following at the headache clinic Getting botox Taking topamax Will try new med when it comes out

## 2016-08-28 NOTE — Assessment & Plan Note (Addendum)
Continue Current medications 

## 2016-08-29 ENCOUNTER — Other Ambulatory Visit: Payer: Self-pay | Admitting: Internal Medicine

## 2016-08-29 ENCOUNTER — Telehealth: Payer: Self-pay | Admitting: Genetic Counselor

## 2016-08-29 ENCOUNTER — Encounter: Payer: Self-pay | Admitting: Genetic Counselor

## 2016-08-29 DIAGNOSIS — Z1379 Encounter for other screening for genetic and chromosomal anomalies: Secondary | ICD-10-CM | POA: Insufficient documentation

## 2016-08-29 NOTE — Telephone Encounter (Signed)
LM on VM with good news on results.  Asked that she CB.  Left CB instructions.

## 2016-08-29 NOTE — Telephone Encounter (Signed)
Revealed that patient had negative genetic tesitng.  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  Reminded her that we do not change medical management based on these results.  Offered to re-requisition to test the melanoma genes since her uncle had an eye cancer and not all of the melanoma genes were on the original panel.  She agreed to this.  I will call her when that result comes back.

## 2016-08-31 ENCOUNTER — Encounter: Payer: Self-pay | Admitting: Internal Medicine

## 2016-08-31 DIAGNOSIS — E1169 Type 2 diabetes mellitus with other specified complication: Secondary | ICD-10-CM | POA: Insufficient documentation

## 2016-08-31 DIAGNOSIS — R7303 Prediabetes: Secondary | ICD-10-CM | POA: Insufficient documentation

## 2016-09-02 ENCOUNTER — Other Ambulatory Visit: Payer: Self-pay | Admitting: Emergency Medicine

## 2016-09-02 DIAGNOSIS — E782 Mixed hyperlipidemia: Secondary | ICD-10-CM

## 2016-09-06 ENCOUNTER — Ambulatory Visit: Payer: Self-pay | Admitting: Genetic Counselor

## 2016-09-06 ENCOUNTER — Telehealth: Payer: Self-pay | Admitting: Emergency Medicine

## 2016-09-06 ENCOUNTER — Telehealth: Payer: Self-pay | Admitting: Genetic Counselor

## 2016-09-06 DIAGNOSIS — Z803 Family history of malignant neoplasm of breast: Secondary | ICD-10-CM

## 2016-09-06 DIAGNOSIS — Z800 Family history of malignant neoplasm of digestive organs: Secondary | ICD-10-CM

## 2016-09-06 DIAGNOSIS — Z1379 Encounter for other screening for genetic and chromosomal anomalies: Secondary | ICD-10-CM

## 2016-09-06 MED ORDER — METFORMIN HCL 1000 MG PO TABS: 1000.0000 mg | ORAL_TABLET | Freq: Two times a day (BID) | ORAL | 1 refills | Status: DC

## 2016-09-06 NOTE — Progress Notes (Signed)
HPI: Kayla Floyd was previously seen in the [place] clinic due to a family history of cancer and concerns regarding a hereditary predisposition to cancer. Please refer to our prior cancer genetics clinic note for more information regarding Kayla Floyd's medical, social and family histories, and our assessment and recommendations, at the time. Kayla Floyd's recent genetic test results were disclosed to her, as were recommendations warranted by these results. These results and recommendations are discussed in more detail below.  CANCER HISTORY:   No history exists.    FAMILY HISTORY:  We obtained a detailed, 4-generation family history.  Significant diagnoses are listed below: Family History  Problem Relation Age of Onset  . Bipolar disorder Mother   . Stroke Mother        Age of 76 while on OCP  . Other Father        MVA  . Heart disease Father   . Heart attack Father   . Colon cancer Maternal Grandmother 68  . Cancer Maternal Grandmother   . Hypertension Sister   . Hypertension Sister   . Migraines Sister   . Breast cancer Paternal Grandmother        dx in her 30s-40s  . Cancer Paternal Uncle        ocular  . Breast cancer Paternal Aunt 54  . Lung cancer Paternal Uncle     The patient does not have children.  She has two sisters who are cancer free.  Her father died in a MVA.  He has four brothers and three sisters.  One sister died of breast cancer in her 54's, a brother died of an ocular cancer, and another brother died of lung cancer.  The patient's paternal grandmother had breast cancer in her 5's-40's.  The patient's mother had cervical cancer.  She had three brothers and two sisters who are cancer free.  Her mother had colon cancer at 39.   Kayla Floyd is unaware of previous family history of genetic testing for hereditary cancer risks. Patient's maternal ancestors are of Caucasian descent, and paternal ancestors are  of Caucasian descent. There is no reported Ashkenazi Jewish ancestry. There is no known consanguinity.  GENETIC TEST RESULTS: Genetic testing reported out on August 31, 2016 through the common hereditary cancer panel and the melanoma cancer panel found no deleterious mutations.  The Hereditary Gene Panel offered by [facility] includes sequencing and/or deletion duplication testing of the following 46 genes: APC, ATM, AXIN2, BARD1, BMPR1A, BRCA1, BRCA2, BRIP1, CDH1, CDKN2A (p14ARF), CDKN2A (p16INK4a), CHEK2, CTNNA1, DICER1, EPCAM (Deletion/duplication testing only), GREM1 (promoter region deletion/duplication testing only), KIT, MEN1, MLH1, MSH2, MSH3, MSH6, MUTYH, NBN, NF1, NHTL1, PALB2, PDGFRA, PMS2, POLD1, POLE, PTEN, RAD50, RAD51C, RAD51D, SDHB, SDHC, SDHD, SMAD4, SMARCA4. STK11, TP53, TSC1, TSC2, and VHL.  The following genes were evaluated for sequence changes only: SDHA and HOXB13 c.251G>A variant only. The Melanoma panel offered by [facility] includes sequencing and/or deletion duplication testing of the following 12 genes: BAP1, BRCA1, BRCA2, BRIP1, CDK4, CDKN2A (p14ARF), CDKN2A (p16INK4a), MC1R, POT1, PTEN, RB1, TERT, and TP53.  The following gene was evaluated for sequence changes only: MITF (c.952G>A, p.GLU318Lys variant only).   The test report has been scanned into EPIC and is located under the Molecular Pathology section of the Results Review tab.   We discussed with Kayla Floyd that since the current genetic testing is not perfect, it is possible there may be a gene mutation in one of these genes that current testing cannot detect,  but that chance is small. We also discussed, that it is possible that another gene that has not yet been discovered, or that we have not yet tested, is responsible for the cancer diagnoses in the family, and it is, therefore, important to remain in touch with cancer genetics in the future so that we can continue to offer Kayla Floyd the most up to date  genetic testing.   Genetic testing did detect two Variants of Unknown Significance - one in the CHEK2 gene called c.1556G>A (p.Arg519Gln) and the other in the MSH6 gene called c.3758T>C (p.Val253Ala). At this time, it is unknown if these variants are associated with increased cancer risk or if this is a normal finding, but most variants such as this get reclassified to being inconsequential. It should not be used to make medical management decisions. With time, we suspect the lab will determine the significance of these variants, if any. If we do learn more about it, we will try to contact Kayla Floyd to discuss it further. However, it is important to stay in touch with [place] periodically and keep the address and phone number up to date.     CANCER SCREENING RECOMMENDATIONS:  This normal result is reassuring and indicates that Kayla Floyd does not likely have an increased risk of cancer due to a mutation in one of these genes.  We, therefore, recommended  Kayla Floyd continue to follow the cancer screening guidelines provided by her primary healthcare providers.   RECOMMENDATIONS FOR FAMILY MEMBERS: Women in this family might be at some increased risk of developing cancer, over the general population risk, simply due to the family history of cancer. We recommended women in this family have a yearly mammogram beginning at age 35, or 92 years younger than the earliest onset of cancer, an annual clinical breast exam, and perform monthly breast self-exams. Women in this family should also have a gynecological exam as recommended by their primary provider. All family members should have a colonoscopy by age 96.  FOLLOW-UP: Lastly, we discussed with Kayla Floyd that cancer genetics is a rapidly advancing field and it is possible that new genetic tests will be appropriate for her and/or her family members in the future. We encouraged her to remain in contact with cancer genetics  on an annual basis so we can update her personal and family histories and let her know of advances in cancer genetics that may benefit this family.   Our contact number was provided. Kayla Floyd's questions were answered to her satisfaction, and she knows she is welcome to call us at anytime with additional questions or concerns.   Roma Kayser, MS, [facility] Certified Genetic Counselor [email or web address]

## 2016-09-06 NOTE — Telephone Encounter (Signed)
Ok to fill 

## 2016-09-06 NOTE — Telephone Encounter (Signed)
Revealed that she had negative testing on the reflexed melanoma panel.  Reported that all testing was essentially negative.  She had two VUS in CHEK2 and MSH6. We will contact her once the VUS has been reclassified.

## 2016-09-06 NOTE — Telephone Encounter (Signed)
Received Refill request for Metformin. You have not filled this for pt. Okay to fill?

## 2016-09-09 ENCOUNTER — Other Ambulatory Visit: Payer: Medicare Other

## 2016-10-12 ENCOUNTER — Other Ambulatory Visit: Payer: Self-pay | Admitting: Internal Medicine

## 2016-10-14 NOTE — Telephone Encounter (Signed)
Should pt be taking both of these meds together?

## 2016-10-14 NOTE — Telephone Encounter (Signed)
She does take both but not at the same time.  rx's sent.

## 2016-10-25 ENCOUNTER — Other Ambulatory Visit (INDEPENDENT_AMBULATORY_CARE_PROVIDER_SITE_OTHER): Payer: Medicare Other

## 2016-10-25 DIAGNOSIS — E782 Mixed hyperlipidemia: Secondary | ICD-10-CM | POA: Diagnosis not present

## 2016-10-25 LAB — LIPID PANEL
CHOLESTEROL: 219 mg/dL — AB (ref 0–200)
HDL: 41.8 mg/dL (ref >39.00)
Total CHOL/HDL Ratio: 5
Triglycerides: 460 mg/dL — ABNORMAL HIGH (ref 0.0–149.0)

## 2016-10-25 LAB — LDL CHOLESTEROL, DIRECT: Direct LDL: 109 mg/dL

## 2016-10-27 NOTE — Progress Notes (Signed)
Subjective:    Patient ID: Kayla Floyd, female    DOB: April 09, 1980, 36 y.o.   MRN: [id number]  HPI The patient is here for follow up.  Hypertriglyceridemia:  She has had elevated triglycerides over the years.  She is limited in what she is able to eat due to her gastroparesis.  She eats a lot of triglycerides.  She is not exercising.  She has gained weight.    Dysautonomia:  She follows with cardiology.  She has had tow syncopal episodes in the past two months.  She is not able to exercise.  She has not seen cardiology recently.    Obese:  She is gaining weight.  She is not able to exercise.  She is not eating much and should not be gaining weight.   Difficulty sleeping:  She takes melatonin, remeron. She is taking 6 mg of melatonin and this is helping her sleep well.    Depression: She is taking her medication daily as prescribed. She denies any side effects from the medication. She feels her depression is not as well controlled as she would like it.     Chronic muscle pain in back and neck:  She continues to have chronic back.  She takes baclofen daily.  She sometimes takes tizanidine when the pain is severe, but will not take it instead of the baclofen or at a different time.   Anxiety:  We changed her sertraline to cymbalta at her last visit since her anxiety was not controlled and she has chronic muscle pain.  She is taking her medication daily as prescribed. She denies any side effects from the medication. She feels her anxiety is controlled for the most part, but could be better.     GERD:  She is taking her medication daily as prescribed.  She denies any GERD symptoms and feels her GERD is well controlled.    Medications and allergies reviewed with patient and updated if appropriate.  Patient Active Problem List   Diagnosis Date Noted  . Gastroparesis 10/28/2016  . Prediabetes 08/31/2016  . Genetic testing 08/29/2016  . Depression 08/28/2016  . Urinary  frequency 08/28/2016  . Family history of colon cancer   . Family history of breast cancer   . Obese 03/15/2015  . Chronic migraine without aura without status migrainosus, not intractable 06/20/2014  . Tear meniscus knee 06/14/2010  . Dysautonomia - POTS 03/20/2010  . Anxiety 07/30/2009  . Essential hypertension 07/28/2009  . SYNCOPE AND COLLAPSE 07/10/2009  . PALPITATIONS 07/10/2009  . POLYCYSTIC OVARIAN DISEASE 08/31/2008  . Hypercholesterolemia with hypertriglyceridemia 08/31/2008  . GERD 08/31/2008  . Chronic back pain 08/31/2008    Current Outpatient Prescriptions on File Prior to Visit  Medication Sig Dispense Refill  . ALPRAZolam (XANAX) 0.5 MG tablet Take 1 tablet (0.5 mg total) by mouth 2 (two) times daily as needed for anxiety. 60 tablet 5  . atenolol (TENORMIN) 25 MG tablet Take 1 tablet (25 mg total) by mouth daily. 90 tablet 3  . atorvastatin (LIPITOR) 10 MG tablet Take 1 tablet (10 mg total) by mouth daily. 90 tablet 3  . baclofen (LIORESAL) 10 MG tablet TAKE 1 TABLET BY MOUTH 3 TIMES A DAY AS NEEDED FOR MUSCLE SPASM 30 tablet 2  . botulinum toxin Type A (BOTOX) 100 UNITS SOLR injection Inject 100 Units into the muscle every 3 (three) months. Every 12 weeks at [facility] 2 vial   . cetirizine (ZYRTEC) 10 MG tablet Take  10 mg by mouth daily. Reported on 07/13/2015    . diclofenac (VOLTAREN) 75 MG EC tablet Take 1 tablet (75 mg total) by mouth 2 (two) times daily. 60 tablet 5  . metFORMIN (GLUCOPHAGE) 1000 MG tablet Take 1 tablet (1,000 mg total) by mouth 2 (two) times daily with a meal. 180 tablet 1  . mirtazapine (REMERON) 45 MG tablet Take 1 tablet (45 mg total) by mouth at bedtime. 30 tablet 5  . Multiple Vitamin (MULTIVITAMIN) tablet Take 1 tablet by mouth daily.      . norethindrone-ethinyl estradiol (MICROGESTIN) 1-20 MG-MCG tablet Take 1 tablet by mouth daily. 3 Package 4  . omeprazole (PRILOSEC) 40 MG capsule Take 1 capsule (40 mg total) by mouth daily. 90  capsule 3  . tiZANidine (ZANAFLEX) 4 MG tablet TAKE 1 TABLET BY MOUTH EVERY 8 HOURS AS NEEDED FOR MUSCLE SPASMS 30 tablet 2  . topiramate (TOPAMAX) 50 MG tablet Take 1 tablet (50 mg total) by mouth 2 (two) times daily.    . valACYclovir (VALTREX) 500 MG tablet Take 1 tablet (500 mg total) by mouth daily. 90 tablet 4  . [DISCONTINUED] DULoxetine (CYMBALTA) 30 MG capsule Take 1 capsule (30 mg total) by mouth daily. 30 capsule 5   No current facility-administered medications on file prior to visit.     Past Medical History:  Diagnosis Date  . ANXIETY   . BACK PAIN, CHRONIC   . DYSAUTONOMIA 2011   "POTS" , recurrent syncope - follows with cards for same  . Family history of breast cancer   . Family history of colon cancer   . GERD   . 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 HYPERLIPIDEMIA   . MIGRAINE HEADACHE   . Palpitations   . POLYCYSTIC OVARIAN DISEASE   . POTS (postural orthostatic tachycardia syndrome)   . Tear meniscus knee 06/14/2010   right    Past Surgical History:  Procedure Laterality Date  . Cervical spine  2010   ACDF c5-6  . LUMBAR LAMINECTOMY  2008  . TONSILLECTOMY AND ADENOIDECTOMY      Social History   Social History  . Marital status: Single    Spouse name: N/A  . Number of children: N/A  . Years of education: N/A   Social History Main Topics  . Smoking status: Former Smoker    Packs/day: 1.00    Years: 10.00    Quit date: 03/25/2005  . Smokeless tobacco: Never Used  . Alcohol use 0.0 oz/week     Comment: occasionally  . Drug use: No  . Sexual activity: Yes     Comment: 1st intercourse 36 yo-More than 5 partners   Other Topics Concern  . Not on file   Social History Narrative   Disability since 10/2012 due to POTS, 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[employer]      Some exercise on a regular exericse    Family History  Problem Relation Age of Onset  . Bipolar disorder Mother   . Stroke Mother        Age of 37 while on OCP    . Other Father        MVA  . Heart disease Father   . Heart attack Father   . Colon cancer Maternal Grandmother [id number]  . Cancer Maternal Grandmother   . Hypertension Sister   . Hypertension Sister   . Migraines Sister   . Breast cancer Paternal Grandmother  dx in her 30s-40s  . Cancer Paternal Uncle        ocular  . Breast cancer Paternal Aunt 87  . Lung cancer Paternal Uncle     Review of Systems  Constitutional: Negative for chills and fever.  Respiratory: Positive for cough, shortness of breath (increased, with activity) and wheezing.   Cardiovascular: Positive for chest pain, palpitations and leg swelling.  Gastrointestinal: Positive for abdominal distention and diarrhea. Negative for blood in stool.       Occasional GERD  Endocrine: Positive for cold intolerance and heat intolerance.  Neurological: Positive for light-headedness and headaches.  Psychiatric/Behavioral: Positive for dysphoric mood. The patient is nervous/anxious.        Objective:   Vitals:   10/28/16 1403  BP: 110/80  Pulse: (!) 107  Resp: 20  Temp: 98.2 F (36.8 C)   Wt Readings from Last 3 Encounters:  10/28/16 202 lb (91.6 kg)  08/28/16 199 lb (90.3 kg)  06/12/16 194 lb (88 kg)   Body mass index is 33.61 kg/m.   Physical Exam    Constitutional: Appears well-developed and well-nourished. No distress.  HENT:  Head: Normocephalic and atraumatic.  Neck: Neck supple. No tracheal deviation present. No thyromegaly present.  No cervical lymphadenopathy Cardiovascular: Normal rate, regular rhythm and normal heart sounds.   No murmur heard. No carotid bruit .  No edema Pulmonary/Chest: Effort normal and breath sounds normal. No respiratory distress. No has no wheezes. No rales.  Skin: Skin is warm and dry. Not diaphoretic.  Psychiatric: Normal mood and affect. Behavior is normal.      Assessment & Plan:    See Problem List for Assessment and Plan of chronic medical problems.

## 2016-10-27 NOTE — Assessment & Plan Note (Addendum)
Taking metformin for PCOS Diet is very limited due to gastroparesis - tends to eat more carbs Low sugar Stressed regular exercise, weight loss

## 2016-10-28 ENCOUNTER — Encounter: Payer: Self-pay | Admitting: Internal Medicine

## 2016-10-28 ENCOUNTER — Ambulatory Visit (INDEPENDENT_AMBULATORY_CARE_PROVIDER_SITE_OTHER): Payer: Medicare Other | Admitting: Internal Medicine

## 2016-10-28 VITALS — BP 110/80 | HR 107 | Temp 98.2°F | Resp 20 | Wt 202.0 lb

## 2016-10-28 DIAGNOSIS — I10 Essential (primary) hypertension: Secondary | ICD-10-CM | POA: Diagnosis not present

## 2016-10-28 DIAGNOSIS — R7303 Prediabetes: Secondary | ICD-10-CM

## 2016-10-28 DIAGNOSIS — G909 Disorder of the autonomic nervous system, unspecified: Secondary | ICD-10-CM

## 2016-10-28 DIAGNOSIS — K219 Gastro-esophageal reflux disease without esophagitis: Secondary | ICD-10-CM | POA: Diagnosis not present

## 2016-10-28 DIAGNOSIS — M549 Dorsalgia, unspecified: Secondary | ICD-10-CM

## 2016-10-28 DIAGNOSIS — E782 Mixed hyperlipidemia: Secondary | ICD-10-CM | POA: Diagnosis not present

## 2016-10-28 DIAGNOSIS — F419 Anxiety disorder, unspecified: Secondary | ICD-10-CM | POA: Diagnosis not present

## 2016-10-28 DIAGNOSIS — F3289 Other specified depressive episodes: Secondary | ICD-10-CM

## 2016-10-28 DIAGNOSIS — G8929 Other chronic pain: Secondary | ICD-10-CM

## 2016-10-28 DIAGNOSIS — K3184 Gastroparesis: Secondary | ICD-10-CM | POA: Diagnosis not present

## 2016-10-28 DIAGNOSIS — G901 Familial dysautonomia [Riley-Day]: Secondary | ICD-10-CM

## 2016-10-28 MED ORDER — DULOXETINE HCL 60 MG PO CPEP: 60.0000 mg | ORAL_CAPSULE | Freq: Every day | ORAL | 1 refills | Status: DC

## 2016-10-28 NOTE — Assessment & Plan Note (Signed)
GERD controlled Continue daily medication  

## 2016-10-28 NOTE — Assessment & Plan Note (Addendum)
She can only eat certain foods and can only eat small amounts Does not eat a lot Gaining weight She has seen nutrition and GI in the past Diet as tolerated Can consider seeing GI again in future

## 2016-10-28 NOTE — Assessment & Plan Note (Signed)
BP Readings from Last 3 Encounters:  10/28/16 110/80  08/28/16 104/76  06/12/16 124/80   BP well controlled Current regimen effective and well tolerated Continue current medications at current doses

## 2016-10-28 NOTE — Assessment & Plan Note (Signed)
Elevated trigs Taking lipitor Eating a lot of carbs, not able to exercise, gaining weight Ideally work on lifestyle  Can consider a fibrate

## 2016-10-28 NOTE — Assessment & Plan Note (Signed)
Not ideally controlled Will increase cymbalta to 60 mg daily

## 2016-10-28 NOTE — Assessment & Plan Note (Signed)
Improved, but still having depression Will increase cymbalta to 60 mg

## 2016-10-28 NOTE — Assessment & Plan Note (Addendum)
Has had two episodes of syncope in the past two months Has multiple symptoms related to dysautonomia Will follow up with Dr Graciela HusbandsKlein

## 2016-10-28 NOTE — Patient Instructions (Addendum)
Schedule an appointment with Dr Graciela HusbandsKlein.  Your cholesterol is better.  Discuss further with Dr Graciela HusbandsKlein.   Medications reviewed and updated.  Changes include increasing cymbalta to 60 mg daily.   Your prescription(s) have been submitted to your pharmacy. Please take as directed and contact our office if you believe you are having problem(s) with the medication(s).  A referral was ordered for physical therapy.  Please followup in 6 months

## 2016-10-28 NOTE — Assessment & Plan Note (Signed)
Taking baclofen or tizanidine as needed Taking lyrica, diclofenac Wants to restart PT - will refer

## 2016-10-31 ENCOUNTER — Encounter: Payer: Self-pay | Admitting: Internal Medicine

## 2016-10-31 DIAGNOSIS — G901 Familial dysautonomia [Riley-Day]: Secondary | ICD-10-CM

## 2016-11-22 ENCOUNTER — Ambulatory Visit: Payer: Medicare Other | Admitting: Internal Medicine

## 2016-11-27 ENCOUNTER — Encounter: Payer: Self-pay | Admitting: Internal Medicine

## 2016-11-27 ENCOUNTER — Ambulatory Visit (INDEPENDENT_AMBULATORY_CARE_PROVIDER_SITE_OTHER): Payer: Medicare Other | Admitting: Internal Medicine

## 2016-11-27 VITALS — BP 116/74 | HR 102 | Ht 65.0 in | Wt 202.0 lb

## 2016-11-27 DIAGNOSIS — G909 Disorder of the autonomic nervous system, unspecified: Secondary | ICD-10-CM | POA: Diagnosis not present

## 2016-11-27 DIAGNOSIS — G901 Familial dysautonomia [Riley-Day]: Secondary | ICD-10-CM

## 2016-11-27 NOTE — Progress Notes (Signed)
ELECTROPHYSIOLOGY OFFICE NOTE  Patient ID: Kayla SermonBrandi M Turpin-Burnette, MRN: [id number], DOB/AGE: 12-02-1980 36 y.o. Admit date: (Not on file) Date of Consult: 11/27/2016  Primary Physician: Pincus SanesBurns, Stacy J, MD [facility] Floyd is a 36 y.o. female who is being seen today at her own request   Chief Complaint: dysautonomia   HPI Kayla SermonBrandi M Strohecker-Burnette is a 36 y.o. 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 I last saw her about 3-1/2 years ago.  She had been doing reasonably well able to get through her activities are fairly With acceptable disruptions until the last few months where she has noted increasing problems with orthostatic intolerance presyncope and syncope. She has had more nausea she has noted more venous pooling with lower extremity discoloration. She has had increasing problems with brain fall and short-term memory. Surprisingly, her sleep pattern has been quite good.   Around the time that this started worsening, number of changes were happening. She is going through a divorce. This has been associated with significant stress. She does move to [place]. She also was getting intermittent intravenous fluid infusions; she lost access to this for some time but has been able to reestablish with an infusion center in [place]. She says she benefits for 1-2 weeks time following infusion.  She does not exercise. She does walk around. She is intolerant. She is volume depleted and salt deplete. She has some issues related to fluid retention.  She does not smoke or use recreational drugs he uses alcohol occasionally.  Recent blood work, not available to us, reportedly demonstrates normal thyroid and blood count  She also has joint hypermobility syndrome.  She's had problems with diffuse and chronic pain.  She's involved in the national dysautonomia support network   Past Medical History:  Diagnosis Date  . ANXIETY   .  BACK PAIN, CHRONIC   . DYSAUTONOMIA 2011   "POTS" , recurrent syncope - follows with cards for same  . Family history of breast cancer   . Family history of colon cancer   . GERD   . 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 HYPERLIPIDEMIA   . MIGRAINE HEADACHE   . Palpitations   . POLYCYSTIC OVARIAN DISEASE   . POTS (postural orthostatic tachycardia syndrome)   . Tear meniscus knee 06/14/2010   right      Surgical History:  Past Surgical History:  Procedure Laterality Date  . Cervical spine  2010   ACDF c5-6  . LUMBAR LAMINECTOMY  2008  . TONSILLECTOMY AND ADENOIDECTOMY       Home Meds: Prior to Admission medications   Medication Sig Start Date End Date Taking? Authorizing Provider  ALPRAZolam Prudy Feeler(XANAX) 0.5 MG tablet Take 1 tablet (0.5 mg total) by mouth 2 (two) times daily as needed for anxiety. 07/11/14  Yes Newt LukesLeschber, Valerie A, MD  atenolol (TENORMIN) 25 MG tablet Take 1 tablet (25 mg total) by mouth daily. 08/28/16  Yes Burns, Bobette MoStacy J, MD  atorvastatin (LIPITOR) 10 MG tablet Take 1 tablet (10 mg total) by mouth daily. 08/28/16  Yes Burns, Bobette MoStacy J, MD  b complex vitamins tablet Take 1 tablet by mouth daily.   Yes [provider]  baclofen (LIORESAL) 10 MG tablet TAKE 1 TABLET BY MOUTH 3 TIMES A DAY AS NEEDED FOR MUSCLE SPASM 10/14/16  Yes Burns, Bobette MoStacy J, MD  botulinum toxin Type A (BOTOX) 100 UNITS SOLR injection Inject 100 Units into the  muscle every 3 (three) months. Every 12 weeks at [facility] 03/15/15  Yes Newt Lukes, MD  cetirizine (ZYRTEC) 10 MG tablet Take 10 mg by mouth daily. Reported on 07/13/2015   Yes [provider]  diclofenac (VOLTAREN) 75 MG EC tablet Take 1 tablet (75 mg total) by mouth 2 (two) times daily. 08/28/16  Yes Burns, Bobette Mo, MD  DULoxetine (CYMBALTA) 60 MG capsule Take 1 capsule (60 mg total) by mouth daily. 10/28/16  Yes Burns, Bobette Mo, MD  Erenumab-aooe 70 MG/ML SOAJ Inject 70 mg into the skin  every 30 (thirty) days. 11/14/16 11/14/17 Yes [provider]  MELATONIN PO Take 6 mg by mouth daily.   Yes [provider]  metFORMIN (GLUCOPHAGE) 1000 MG tablet Take 1 tablet (1,000 mg total) by mouth 2 (two) times daily with a meal. 09/06/16  Yes Burns, Bobette Mo, MD  mirtazapine (REMERON) 45 MG tablet Take 1 tablet (45 mg total) by mouth at bedtime. 08/28/16  Yes Burns, Bobette Mo, MD  Multiple Vitamin (MULTIVITAMIN) tablet Take 1 tablet by mouth daily.     Yes [provider]  norethindrone-ethinyl estradiol (MICROGESTIN) 1-20 MG-MCG tablet Take 1 tablet by mouth daily. 06/12/16  Yes Fontaine, Nadyne Coombes, MD  omeprazole (PRILOSEC) 40 MG capsule Take 1 capsule (40 mg total) by mouth daily. 08/28/16  Yes Burns, Bobette Mo, MD  pregabalin (LYRICA) 150 MG capsule Take by mouth 2 (two) times daily.  09/06/16 09/06/17 Yes [provider]  tiZANidine (ZANAFLEX) 4 MG tablet TAKE 1 TABLET BY MOUTH EVERY 8 HOURS AS NEEDED FOR MUSCLE SPASMS 10/14/16  Yes Burns, Bobette Mo, MD  topiramate (TOPAMAX) 50 MG tablet Take 1 tablet (50 mg total) by mouth 2 (two) times daily. 06/20/14  Yes Newt Lukes, MD  valACYclovir (VALTREX) 500 MG tablet Take 1 tablet (500 mg total) by mouth daily. 06/12/16  Yes Fontaine, Nadyne Coombes, MD      Allergies:  Allergies  Allergen Reactions  . Beef-Derived Products     anaphylaxes   . Glycerol, Iodinated     anaphylaxes   . Pork-Derived Products     anaplaxis    Social History   Social History  . Marital status: Single    Spouse name: N/A  . Number of children: N/A  . Years of education: N/A   Occupational History  . Not on file.   Social History Main Topics  . Smoking status: Former Smoker    Packs/day: 1.00    Years: 10.00    Quit date: 03/25/2005  . Smokeless tobacco: Never Used  . Alcohol use 0.0 oz/week     Comment: occasionally  . Drug use: No  . Sexual activity: Yes     Comment: 1st intercourse 36 yo-More than 5 partners   Other  Topics Concern  . 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 Bipolar disorder Mother   . Stroke Mother        Age of 25 while on OCP  . Other Father        MVA  . Heart disease Father   . Heart attack Father   . Colon cancer Maternal Grandmother 66  . Cancer Maternal Grandmother   . Hypertension Sister   . Hypertension  Sister   . Migraines Sister   . Breast cancer Paternal Grandmother        dx in her 30s-40s  . Cancer Paternal Uncle        ocular  . Breast cancer Paternal Aunt 63  . Lung cancer Paternal Uncle      ROS:  Please see the history of present illness.     All other systems reviewed and negative.    Physical Exam: Blood pressure 116/74, pulse (!) 102, height 5\' 5"  (1.651 m), weight 202 lb (91.6 kg), SpO2 98 %. General: Well developed, well nourished female in no acute distress. Head: Normocephalic, atraumatic, sclera non-icteric, no xanthomas, nares are without discharge. EENT: normal Lymph Nodes:  none Back: without scoliosis/kyphosis, no CVA tendersness Neck: Negative for carotid bruits. JVD not elevated. Lungs: Clear bilaterally to auscultation without wheezes, rales, or rhonchi. Breathing is unlabored. Heart: RRR with S1 S2. No  murmur , rubs, or gallops appreciated. Abdomen: Soft, non-tender, non-distended with normoactive bowel sounds. No hepatomegaly. No rebound/guarding. No obvious abdominal masses. Msk:  Strength and tone appear normal for age. Extremities: No clubbing or cyanosis. No edema.  Distal pedal pulses are 2+ and equal bilaterally. Skin: Warm and Dry Neuro: Alert and oriented X 3. CN III-XII intact Grossly normal sensory and motor function . Psych:  Responds to questions appropriately with a normal affect.      Labs: Cardiac Enzymes No results for input(s): CKTOTAL, CKMB,  TROPONINI in the last 72 hours. CBC Lab Results  Component Value Date   WBC 6.8 08/28/2016   HGB 10.6 (L) 08/28/2016   HCT 31.3 (L) 08/28/2016   MCV 79.7 08/28/2016   PLT 258.0 08/28/2016   PROTIME: No results for input(s): LABPROT, INR in the last 72 hours. Chemistry No results for input(s): NA, K, CL, CO2, BUN, CREATININE, CALCIUM, PROT, BILITOT, ALKPHOS, ALT, AST, GLUCOSE in the last 168 hours.  Invalid input(s): LABALBU Lipids Lab Results  Component Value Date   CHOL 219 (H) 10/25/2016   HDL 41.80 10/25/2016   LDLCALC 92 04/07/2013   TRIG (H) 10/25/2016    460.0 Triglyceride is over 400; calculations on Lipids are invalid.   BNP No results found for: PROBNP Thyroid Function Tests: No results for input(s): TSH, T4TOTAL, T3FREE, THYROIDAB in the last 72 hours.  Invalid input(s): FREET3    Miscellaneous Lab Results  Component Value Date   DDIMER 0.29 09/02/2010    Radiology/Studies:  No results found.  EKG: Sinus @ 102 13/08/34   Assessment and Plan:  Dysautonomia  EDS  Depression  Diabetes   Dysautonomia Has been problematic in the context of significant psychosocial stress. We reviewed again the physiology. She is very in tune as she is working with a national dysautonomic support group.  I've encouraged her to pursue aerobic exercise. I've encouraged her increase her salt and water intake. And attend to her mental health. Thankfully she is sleeping well.   Sherryl Manges

## 2016-11-27 NOTE — Patient Instructions (Addendum)
Medication Instructions:  Your physician recommends that you continue on your current medications as directed. Please refer to the Current Medication list given to you today.   Labwork: None ordered  Testing/Procedures: None ordered  Follow-Up:  Your physician wants you to follow-up in: 1 year with Dr. Klein. You will receive a reminder letter in the mail two months in advance. If you don't receive a letter, please call our office to schedule the follow-up appointment.   Any Other Special Instructions Will Be Listed Below (If Applicable).     If you need a refill on your cardiac medications before your next appointment, please call your pharmacy.   

## 2016-12-02 ENCOUNTER — Other Ambulatory Visit: Payer: Self-pay | Admitting: Internal Medicine

## 2016-12-18 ENCOUNTER — Other Ambulatory Visit: Payer: Self-pay | Admitting: Internal Medicine

## 2016-12-30 ENCOUNTER — Encounter: Payer: Self-pay | Admitting: Internal Medicine

## 2017-01-08 ENCOUNTER — Other Ambulatory Visit: Payer: Self-pay | Admitting: Internal Medicine

## 2017-02-05 ENCOUNTER — Other Ambulatory Visit: Payer: Self-pay | Admitting: Internal Medicine

## 2017-02-15 ENCOUNTER — Encounter: Payer: Self-pay | Admitting: Gynecology

## 2017-02-17 ENCOUNTER — Other Ambulatory Visit: Payer: Self-pay | Admitting: Internal Medicine

## 2017-02-17 MED ORDER — VALACYCLOVIR HCL 1 G PO TABS: ORAL_TABLET | ORAL | 6 refills | Status: DC

## 2017-02-17 NOTE — Telephone Encounter (Signed)
Take Valtrex 1000 mg daily as a suppressive dose to see if this does not suppress her recurrences.  She can increase it to twice daily at the earliest sign of an outbreak to see if she does not shorten an outbreak.  Prescribed #60 with 6 refills

## 2017-02-27 ENCOUNTER — Other Ambulatory Visit: Payer: Self-pay | Admitting: Emergency Medicine

## 2017-02-27 MED ORDER — MIRTAZAPINE 45 MG PO TABS: 45.0000 mg | ORAL_TABLET | Freq: Every day | ORAL | 0 refills | Status: DC

## 2017-03-09 ENCOUNTER — Other Ambulatory Visit: Payer: Self-pay | Admitting: Internal Medicine

## 2017-03-16 ENCOUNTER — Other Ambulatory Visit: Payer: Self-pay | Admitting: Internal Medicine

## 2017-04-06 ENCOUNTER — Other Ambulatory Visit: Payer: Self-pay | Admitting: Internal Medicine

## 2017-04-09 ENCOUNTER — Other Ambulatory Visit: Payer: Self-pay | Admitting: Internal Medicine

## 2017-04-27 ENCOUNTER — Other Ambulatory Visit: Payer: Self-pay | Admitting: Internal Medicine

## 2017-05-01 NOTE — Progress Notes (Signed)
Subjective:    Patient ID: Kayla Floyd, female    DOB: 07-23-1980, 37 y.o.   MRN: [id number]  HPI The patient is here for follow up.  Hypertension, POTS: She is taking her medication daily. She is compliant with a low sodium diet.  She does experience shortness of breath at times, chest pain, palpitations, headaches and lightheadedness.  Knows the symptoms are new when she has discussed them with cardiology she is not exercising regularly.      Hyperlipidemia: She is taking her medication daily. She is compliant with a low fat/cholesterol diet. She is not exercising regularly.    Prediabetes, PCOS:  She is taking metformin twice daily.  She is compliant with a low sugar/carbohydrate diet.  She is exercising regularly.  GERD:  She is taking her medication daily as prescribed.  She denies any GERD symptoms and feels her GERD is well controlled.  Gastroparesis: She still struggles with eating.  She is often not able to eat until late afternoon.  There are certain foods that she can and cannot eat.  She does not feel that she wants to do anything further at this time.  She has seen specialist in the past.  Depression: She is taking her medication daily as prescribed. She denies any side effects from the medication. She feels her depression is well controlled and she is happy with her current dose of medication.   Anxiety: She is taking her medication daily as prescribed. She denies any side effects from the medication. She feels her anxiety is well controlled and she is happy with her current dose of medication.    Trial to work.  She may try to go back to work, but not full-time.  She is unsure if she will be able to do physically, but does miss work and would like to try something less vigorous than when she was doing before she became disabled.   Medications and allergies reviewed with patient and updated if appropriate.  Patient Active Problem List   Diagnosis Date Noted  .  Gastroparesis 10/28/2016  . Prediabetes 08/31/2016  . Genetic testing 08/29/2016  . Depression 08/28/2016  . Urinary frequency 08/28/2016  . Family history of colon cancer   . Family history of breast cancer   . Obese 03/15/2015  . Chronic migraine without aura without status migrainosus, not intractable 06/20/2014  . Tear meniscus knee 06/14/2010  . Dysautonomia - POTS 03/20/2010  . Anxiety 07/30/2009  . Essential hypertension 07/28/2009  . SYNCOPE AND COLLAPSE 07/10/2009  . PALPITATIONS 07/10/2009  . POLYCYSTIC OVARIAN DISEASE 08/31/2008  . Hypercholesterolemia with hypertriglyceridemia 08/31/2008  . GERD 08/31/2008  . Chronic back pain 08/31/2008    Current Outpatient Medications on File Prior to Visit  Medication Sig Dispense Refill  . ALPRAZolam (XANAX) 0.5 MG tablet Take 1 tablet (0.5 mg total) by mouth 2 (two) times daily as needed for anxiety. 60 tablet 5  . atenolol (TENORMIN) 25 MG tablet Take 1 tablet (25 mg total) by mouth daily. 90 tablet 3  . atorvastatin (LIPITOR) 10 MG tablet Take 1 tablet (10 mg total) by mouth daily. 90 tablet 3  . b complex vitamins tablet Take 1 tablet by mouth daily.    . baclofen (LIORESAL) 10 MG tablet TAKE 1 TABLET BY MOUTH 3 TIMES A DAY AS NEEDED FOR MUSCLE SPASM 30 tablet 0  . botulinum toxin Type A (BOTOX) 100 UNITS SOLR injection Inject 100 Units into the muscle every 3 (three) months.  Every 12 weeks at [facility] 2 vial   . cetirizine (ZYRTEC) 10 MG tablet Take 10 mg by mouth daily. Reported on 07/13/2015    . diclofenac (VOLTAREN) 75 MG EC tablet TAKE 1 TABLET BY MOUTH TWICE A DAY 60 tablet 3  . DULoxetine (CYMBALTA) 60 MG capsule TAKE 1 CAPSULE BY MOUTH EVERY DAY 90 capsule 0  . Erenumab-aooe 70 MG/ML SOAJ Inject 70 mg into the skin every 30 (thirty) days.    Marland Kitchen MELATONIN PO Take 6 mg by mouth daily.    . metFORMIN (GLUCOPHAGE) 1000 MG tablet TAKE 1 TABLET BY MOUTH TWICE A DAY WITH MEALS 180 tablet 0  . mirtazapine (REMERON) 45 MG  tablet Take 1 tablet (45 mg total) by mouth at bedtime. 90 tablet 0  . Multiple Vitamin (MULTIVITAMIN) tablet Take 1 tablet by mouth daily.      . norethindrone-ethinyl estradiol (MICROGESTIN) 1-20 MG-MCG tablet Take 1 tablet by mouth daily. 3 Package 4  . omeprazole (PRILOSEC) 40 MG capsule Take 1 capsule (40 mg total) by mouth daily. 90 capsule 3  . pregabalin (LYRICA) 150 MG capsule Take by mouth 2 (two) times daily.     Marland Kitchen tiZANidine (ZANAFLEX) 4 MG tablet TAKE 1 TABLET BY MOUTH EVERY 8 HOURS AS NEEDED FOR MUSCLE SPASM 30 tablet 2  . topiramate (TOPAMAX) 50 MG tablet Take 1 tablet (50 mg total) by mouth 2 (two) times daily.    . valACYclovir (VALTREX) 1000 MG tablet Take one tablet by mouth daily. Take twice daily for 3 days early onset outbreak. 60 tablet 6   No current facility-administered medications on file prior to visit.     Past Medical History:  Diagnosis Date  . ANXIETY   . BACK PAIN, CHRONIC   . DYSAUTONOMIA 2011   "POTS" , recurrent syncope - follows with cards for same  . Family history of breast cancer   . Family history of colon cancer   . GERD   . 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 HYPERLIPIDEMIA   . MIGRAINE HEADACHE   . Palpitations   . POLYCYSTIC OVARIAN DISEASE   . POTS (postural orthostatic tachycardia syndrome)   . Tear meniscus knee 06/14/2010   right    Past Surgical History:  Procedure Laterality Date  . Cervical spine  2010   ACDF c5-6  . LUMBAR LAMINECTOMY  2008  . TONSILLECTOMY AND ADENOIDECTOMY      Social History   Socioeconomic History  . Marital status: Single    Spouse name: None  . Number of children: None  . Years of education: None  . Highest education level: None  Social Needs  . Financial resource strain: None  . Food insecurity - worry: None  . Food insecurity - inability: None  . Transportation needs - medical: None  . Transportation needs - non-medical: None  Occupational History  .  None  Tobacco Use  . Smoking status: Former Smoker    Packs/day: 1.00    Years: 10.00    Pack years: 10.00    Last attempt to quit: 03/25/2005    Years since quitting: 12.1  . Smokeless tobacco: Never Used  Substance and Sexual Activity  . Alcohol use: Yes    Alcohol/week: 0.0 oz    Comment: occasionally  . Drug use: No  . Sexual activity: Yes    Comment: 1st intercourse 37 yo-More than 5 partners  Other Topics Concern  . None  Social History Narrative   Disability since 10/2012 due to POTS, 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[employer]      Some exercise on a regular exericse    Family History  Problem Relation Age of Onset  . Bipolar disorder Mother   . Stroke Mother        Age of 38 while on OCP  . Other Father        MVA  . Heart disease Father   . Heart attack Father   . Colon cancer Maternal Grandmother [id number]  . Cancer Maternal Grandmother   . Hypertension Sister   . Hypertension Sister   . Migraines Sister   . Breast cancer Paternal Grandmother        dx in her 30s-40s  . Cancer Paternal Uncle        ocular  . Breast cancer Paternal Aunt [id number]  . Lung cancer Paternal Uncle     Review of Systems  Constitutional: Negative for chills and fever.  Respiratory: Positive for shortness of breath (with activity or palpitations). Negative for cough and wheezing.   Cardiovascular: Positive for chest pain (occasionally) and palpitations (occ). Negative for leg swelling.  Neurological: Positive for light-headedness and headaches.       Objective:   Vitals:   05/02/17 1604  BP: 130/86  Pulse: (!) 113  Resp: 16  Temp: 98.1 F (36.7 C)  SpO2: 98%   Wt Readings from Last 3 Encounters:  05/02/17 200 lb (90.7 kg)  11/27/16 202 lb (91.6 kg)  10/28/16 202 lb (91.6 kg)   Body mass index is 33.28 kg/m.   Physical Exam    Constitutional: Appears well-developed and well-nourished. No distress.  HENT:  Head: Normocephalic and atraumatic.  Neck: Neck supple. No tracheal deviation  present. No thyromegaly present.  No cervical lymphadenopathy Cardiovascular: Normal rate, regular rhythm and normal heart sounds.   No murmur heard. No carotid bruit .  No edema Pulmonary/Chest: Effort normal and breath sounds normal. No respiratory distress. No has no wheezes. No rales.  Skin: Skin is warm and dry. Not diaphoretic.  Psychiatric: Normal mood and affect. Behavior is normal.      Assessment & Plan:   She may possibly be getting married in the next few months and may consider getting pregnant in the fall.  She asked about when to stop birth control.  Advised that she can stop a few months prior to starting to get pregnant because she has had difficulty regulating her menses off of birth control.  Did discuss with her she will need to stop some of her medications and needs to plan ahead.  See Problem List for Assessment and Plan of chronic medical problems.

## 2017-05-01 NOTE — Patient Instructions (Addendum)
  Medications reviewed and updated.  No changes recommended at this time.  Your prescription(s) have been submitted to your pharmacy. Please take as directed and contact our office if you believe you are having problem(s) with the medication(s).    Please followup in 6 months   

## 2017-05-02 ENCOUNTER — Ambulatory Visit (INDEPENDENT_AMBULATORY_CARE_PROVIDER_SITE_OTHER): Payer: Medicare Other | Admitting: Internal Medicine

## 2017-05-02 ENCOUNTER — Encounter: Payer: Self-pay | Admitting: Internal Medicine

## 2017-05-02 VITALS — BP 130/86 | HR 113 | Temp 98.1°F | Resp 16 | Wt 200.0 lb

## 2017-05-02 DIAGNOSIS — I10 Essential (primary) hypertension: Secondary | ICD-10-CM

## 2017-05-02 DIAGNOSIS — F3289 Other specified depressive episodes: Secondary | ICD-10-CM

## 2017-05-02 DIAGNOSIS — G901 Familial dysautonomia [Riley-Day]: Secondary | ICD-10-CM

## 2017-05-02 DIAGNOSIS — E782 Mixed hyperlipidemia: Secondary | ICD-10-CM | POA: Diagnosis not present

## 2017-05-02 DIAGNOSIS — R7303 Prediabetes: Secondary | ICD-10-CM | POA: Diagnosis not present

## 2017-05-02 DIAGNOSIS — K219 Gastro-esophageal reflux disease without esophagitis: Secondary | ICD-10-CM | POA: Diagnosis not present

## 2017-05-02 DIAGNOSIS — F419 Anxiety disorder, unspecified: Secondary | ICD-10-CM

## 2017-05-02 DIAGNOSIS — K3184 Gastroparesis: Secondary | ICD-10-CM | POA: Diagnosis not present

## 2017-05-02 MED ORDER — DULOXETINE HCL 60 MG PO CPEP: ORAL_CAPSULE | ORAL | 1 refills | Status: DC

## 2017-05-02 MED ORDER — BACLOFEN 10 MG PO TABS: ORAL_TABLET | ORAL | 1 refills | Status: DC

## 2017-05-02 NOTE — Assessment & Plan Note (Addendum)
Sugar is minimally elevated in the past Currently on metformin for PCO S Continue metformin We will recheck blood work at her next visit

## 2017-05-02 NOTE — Assessment & Plan Note (Signed)
Currently taking metformin She is not fasting does not want to get blood work done today Will recheck triglycerides at her next visit

## 2017-05-02 NOTE — Assessment & Plan Note (Signed)
Controlled, stable Continue current dose of medication  

## 2017-05-02 NOTE — Assessment & Plan Note (Signed)
GERD controlled Continue daily medication  

## 2017-05-02 NOTE — Assessment & Plan Note (Signed)
Following with cardiology Management per cardio

## 2017-05-02 NOTE — Assessment & Plan Note (Addendum)
Has difficulty eating - often can only eat certain foods and not eating until late in the day Has seen specialist in the past and does not want to try anything different at this time

## 2017-05-02 NOTE — Assessment & Plan Note (Signed)
Blood pressure acceptable Continue current medications Following with cardiology for pots

## 2017-05-03 ENCOUNTER — Other Ambulatory Visit: Payer: Self-pay | Admitting: Internal Medicine

## 2017-05-10 ENCOUNTER — Other Ambulatory Visit: Payer: Self-pay | Admitting: Internal Medicine

## 2017-05-15 ENCOUNTER — Other Ambulatory Visit: Payer: Self-pay | Admitting: Internal Medicine

## 2017-05-22 ENCOUNTER — Encounter: Payer: Self-pay | Admitting: Internal Medicine

## 2017-06-16 ENCOUNTER — Other Ambulatory Visit: Payer: Self-pay | Admitting: Gynecology

## 2017-06-16 ENCOUNTER — Other Ambulatory Visit: Payer: Self-pay | Admitting: Internal Medicine

## 2017-06-30 ENCOUNTER — Other Ambulatory Visit: Payer: Self-pay | Admitting: Internal Medicine

## 2017-07-04 ENCOUNTER — Other Ambulatory Visit: Payer: Self-pay | Admitting: Internal Medicine

## 2017-07-04 ENCOUNTER — Other Ambulatory Visit: Payer: Self-pay | Admitting: Gynecology

## 2017-07-11 ENCOUNTER — Other Ambulatory Visit: Payer: Self-pay | Admitting: Gynecology

## 2017-07-13 ENCOUNTER — Other Ambulatory Visit: Payer: Self-pay | Admitting: Internal Medicine

## 2017-07-15 ENCOUNTER — Other Ambulatory Visit: Payer: Self-pay | Admitting: Internal Medicine

## 2017-07-30 ENCOUNTER — Telehealth: Payer: Self-pay | Admitting: *Deleted

## 2017-07-30 NOTE — Telephone Encounter (Signed)
Patient left message in triage voicemail c/o having only 1 cycle in 6months, negative UPT, has PCOS,, taking birth control pills, having symptoms of mood swings. I called patient and left message on voicemail to schedule OV.

## 2017-08-01 ENCOUNTER — Other Ambulatory Visit: Payer: Self-pay | Admitting: Internal Medicine

## 2017-08-07 ENCOUNTER — Other Ambulatory Visit: Payer: Self-pay | Admitting: Internal Medicine

## 2017-08-08 ENCOUNTER — Ambulatory Visit: Payer: Medicare Other | Admitting: Gynecology

## 2017-08-08 ENCOUNTER — Encounter: Payer: Self-pay | Admitting: Gynecology

## 2017-08-08 VITALS — BP 116/76

## 2017-08-08 DIAGNOSIS — N926 Irregular menstruation, unspecified: Secondary | ICD-10-CM

## 2017-08-08 DIAGNOSIS — E282 Polycystic ovarian syndrome: Secondary | ICD-10-CM

## 2017-08-08 NOTE — Patient Instructions (Signed)
Stay on the current birth control pills as we discussed.  Call me if you continue to have issues with these.

## 2017-08-08 NOTE — Progress Notes (Signed)
    Kayla Floyd May 29, 1980 [id number]        37 y.o.  G0P0000 presents with a history of regular menses up until approximately 7 months ago when she stopped having menses.  She continued on her Loestrin 1/20 equivalent but would not bleed at the end of the  active pills.  She would check a UPT and then restart a new pack.  This past Monday she started spotting at the end of her active pills and bled lightly for several days and then passed several clots and then stopped bleeding.  Also notes over the past several months weight gain, bloating, breast tenderness and generalized hormonal type symptoms.  The symptoms seem to improve now that she has started a menses.  Past medical history,surgical history, problem list, medications, allergies, family history and social history were all reviewed and documented in the EPIC chart.  Directed ROS with pertinent positives and negatives documented in the history of present illness/assessment and plan.  Exam: Kennon Portela assistant Vitals:   08/08/17 1220  BP: 116/76   General appearance:  Normal Abdomen soft nontender without masses guarding rebound Pelvic external BUS vagina normal.  Cervix normal.  Uterus grossly normal midline mobile nontender.  Adnexa without masses or tenderness.  Assessment/Plan:  37 y.o. G0P0000 with history of amenorrhea on low-dose oral contraceptives for approximately 7 months and now with a menses at the appropriate time at the end of her pills noting heavy clots times several and now gone.  Also having some "hormonal" symptoms which seem to be improving with her menses.  She does have a history of PCOS with long bouts of amenorrhea before starting on the birth control pills.  She is contemplating pregnancy trial in a year or so.  She did check UPT's during this time of absent menses.  We will go ahead and check baseline labs now to include hCG, TSH, FSH and prolactin.  Various options reviewed to include switching to a  different form of contraception such as IUD, switching to a different pill or continuing on her current pill and seeing how she does over the next several months.  As she is contemplating pregnancy in a year or so she does not want the IUD.  At this point we both agreed to continue on the same pill for the next several months and we will see how she does.  She actually has an annual exam scheduled coming up and she will follow-up at that time.  She will follow-up now for her lab results.  Greater than 50% of our 15-minute office visit was spent in direct face to face counseling and coordination of care with the patient.     Dara Lords MD, 12:39 PM 08/08/2017

## 2017-08-09 LAB — FOLLICLE STIMULATING HORMONE: FSH: 7.8 m[IU]/mL

## 2017-08-09 LAB — HCG, SERUM, QUALITATIVE: Preg, Serum: NEGATIVE

## 2017-08-09 LAB — TSH: TSH: 1.47 mIU/L

## 2017-08-09 LAB — PROLACTIN: Prolactin: 6.3 ng/mL

## 2017-09-10 ENCOUNTER — Other Ambulatory Visit: Payer: Self-pay | Admitting: Gynecology

## 2017-09-21 ENCOUNTER — Encounter: Payer: Self-pay | Admitting: Internal Medicine

## 2017-09-22 ENCOUNTER — Encounter: Payer: Self-pay | Admitting: Internal Medicine

## 2017-09-22 ENCOUNTER — Encounter: Payer: Medicare Other | Admitting: Gynecology

## 2017-09-22 NOTE — Telephone Encounter (Signed)
appt scheduled

## 2017-09-22 NOTE — Progress Notes (Signed)
Subjective:    Patient ID: Kayla Floyd, female    DOB: 16-Jan-1981, 37 y.o.   MRN: [id number]  HPI The patient is here for follow up from the ED.  She went to the ED 09/05/2017 for sudden onset back/flank pain that radiated to her abdominal area.  It was severe.  She had no urinary symptoms.  CT ruled out renal stone.  She was diagnosed with UTI, possible pyelonephritis.  She was given IVF and pain medication.  She was placed on Keflex TID x 7 days and pain medication.  She completed the antibiotics and she felt better.  She felt her symptoms resolved completely.    She went to the ED two days ago. She had sudden onset of pain similar to 6/14.  She tried drinking 3 beers to flush her kidneys, but still could not urinate.  Transvaginal/Pelvic US negative.  UA appeared positive for infection, but culture still pending.  She had IVF.  She had IV Rocephin.  She was sent home with macrobid and pain medication.  She still has pain - now 6/10, but was 10/10.  She feels exhausted.  She slept a lot last night and is still exhausted.   Pain located b/l mid back / lower ribs and wraps around her sides to her lower abdomen.  It is better, but still there.  She has lower abdominal pain/suprapubic pain.  She does have some nausea.  She states her urination is decreased-she feels she is not urinating as much.  She denies any dysuria, difficulty urinating, hematuria or increased frequency.  She denies any fevers..    She is getting muscle cramping throughout her body.     Hypertension, hyperlipidemia: She is taking her medication daily as prescribed.  She feels she has been eating better.  She has been active, but does not exercise regularly.  Prediabetes: She is taking metformin.  She is not exercising regularly, but is active.  She feels she is eating a low sugar/carbohydrate diet.  Medications and allergies reviewed with patient and updated if appropriate.  Patient Active Problem List   Diagnosis  Date Noted  . Gastroparesis 10/28/2016  . Prediabetes 08/31/2016  . Genetic testing 08/29/2016  . Depression 08/28/2016  . Family history of colon cancer   . Family history of breast cancer   . Obese 03/15/2015  . Chronic migraine without aura without status migrainosus, not intractable 06/20/2014  . Tear meniscus knee 06/14/2010  . Dysautonomia - POTS 03/20/2010  . Anxiety 07/30/2009  . Essential hypertension 07/28/2009  . SYNCOPE AND COLLAPSE 07/10/2009  . PALPITATIONS 07/10/2009  . POLYCYSTIC OVARIAN DISEASE 08/31/2008  . Hypercholesterolemia with hypertriglyceridemia 08/31/2008  . GERD 08/31/2008  . Chronic back pain 08/31/2008    Current Outpatient Medications on File Prior to Visit  Medication Sig Dispense Refill  . ALPRAZolam (XANAX) 0.5 MG tablet Take 1 tablet (0.5 mg total) by mouth 2 (two) times daily as needed for anxiety. 60 tablet 5  . atenolol (TENORMIN) 25 MG tablet Take 1 tablet (25 mg total) by mouth daily. 90 tablet 3  . atorvastatin (LIPITOR) 10 MG tablet TAKE 1 TABLET BY MOUTH EVERY DAY 90 tablet 1  . b complex vitamins tablet Take 1 tablet by mouth daily.    . baclofen (LIORESAL) 10 MG tablet TAKE 1 TABLET BY MOUTH 3 TIMES A DAY AS NEEDED FOR MUSCLE SPASM 270 tablet 1  . botulinum toxin Type A (BOTOX) 100 UNITS SOLR injection Inject 100 Units into  the muscle every 3 (three) months. Every 12 weeks at [facility] 2 vial   . cetirizine (ZYRTEC) 10 MG tablet Take 10 mg by mouth daily. Reported on 07/13/2015    . diclofenac (VOLTAREN) 75 MG EC tablet TAKE 1 TABLET BY MOUTH TWICE A DAY 60 tablet 2  . DULoxetine (CYMBALTA) 60 MG capsule TAKE 1 CAPSULE BY MOUTH EVERY DAY 90 capsule 1  . Erenumab-aooe 70 MG/ML SOAJ Inject 70 mg into the skin every 30 (thirty) days.    Colleen Can. JUNEL 1/20 1-20 MG-MCG tablet TAKE 1 TABLET BY MOUTH EVERY DAY 63 tablet 0  . MELATONIN PO Take 6 mg by mouth daily.    . metFORMIN (GLUCOPHAGE) 1000 MG tablet TAKE 1 TABLET BY MOUTH TWICE A DAY WITH  MEALS 180 tablet 1  . mirtazapine (REMERON) 45 MG tablet TAKE 1 TABLET BY MOUTH AT BEDTIME. 90 tablet 1  . Multiple Vitamin (MULTIVITAMIN) tablet Take 1 tablet by mouth daily.      Marland Kitchen. omeprazole (PRILOSEC) 40 MG capsule Take 1 capsule (40 mg total) by mouth daily. 90 capsule 3  . tiZANidine (ZANAFLEX) 4 MG tablet TAKE 1 TABLET BY MOUTH EVERY 8 HOURS AS NEEDED FOR MUSCLE SPASM 30 tablet 2  . valACYclovir (VALTREX) 1000 MG tablet Take one tablet by mouth daily. Take twice daily for 3 days early onset outbreak. 60 tablet 6  . pregabalin (LYRICA) 150 MG capsule Take by mouth 2 (two) times daily.      No current facility-administered medications on file prior to visit.     Past Medical History:  Diagnosis Date  . ANXIETY   . BACK PAIN, CHRONIC   . DYSAUTONOMIA 2011   "POTS" , recurrent syncope - follows with cards for same  . Family history of breast cancer   . Family history of colon cancer   . GERD   . 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 HYPERLIPIDEMIA   . MIGRAINE HEADACHE   . Palpitations   . POLYCYSTIC OVARIAN DISEASE   . POTS (postural orthostatic tachycardia syndrome)   . Tear meniscus knee 06/14/2010   right    Past Surgical History:  Procedure Laterality Date  . Cervical spine  2010   ACDF c5-6  . LUMBAR LAMINECTOMY  2008  . TONSILLECTOMY AND ADENOIDECTOMY      Social History   Socioeconomic History  . Marital status: Single    Spouse name: Not on file  . Number of children: Not on file  . Years of education: Not on file  . Highest education level: Not on file  Occupational History  . Not on file  Social Needs  . Financial resource strain: Not on file  . Food insecurity:    Worry: Not on file    Inability: Not on file  . Transportation needs:    Medical: Not on file    Non-medical: Not on file  Tobacco Use  . Smoking status: Former Smoker    Packs/day: 1.00    Years: 10.00    Pack years: 10.00    Last attempt to quit:  03/25/2005    Years since quitting: 12.5  . Smokeless tobacco: Never Used  Substance and Sexual Activity  . Alcohol use: Yes    Alcohol/week: 0.0 oz    Comment: occasionally  . Drug use: No  . Sexual activity: Yes    Birth control/protection: Pill    Comment: 1st intercourse 37 yo-More than 5 partners  Lifestyle  . Physical activity:    Days per week: Not on file    Minutes per session: Not on file  . Stress: Not on file  Relationships  . Social connections:    Talks on phone: Not on file    Gets together: Not on file    Attends religious service: Not on file    Active member of club or organization: Not on file    Attends meetings of clubs or organizations: Not on file    Relationship status: Not on file  Other Topics Concern  . 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 Bipolar disorder Mother   . Stroke Mother        Age of 34 while on OCP  . Other Father        MVA  . Heart disease Father   . Heart attack Father   . Colon cancer Maternal Grandmother 14  . Cancer Maternal Grandmother   . Hypertension Sister   . Hypertension Sister   . Migraines Sister   . Breast cancer Paternal Grandmother        dx in her 30s-40s  . Cancer Paternal Uncle        ocular  . Breast cancer Paternal Aunt 42  . Lung cancer Paternal Uncle     Review of Systems  Constitutional: Negative for fever.  Gastrointestinal: Positive for abdominal pain (b/l from back) and nausea (pain related).  Genitourinary: Positive for decreased urine volume and flank pain (b/l). Negative for difficulty urinating, dysuria, frequency and hematuria.       Urine darker at times  Musculoskeletal: Positive for back pain (mid back b/l - CVA area).  Neurological: Positive for light-headedness and headaches.       Objective:   Vitals:   09/23/17 1330    BP: 128/88  Pulse: (!) 105  Resp: 16  Temp: 98 F (36.7 C)  SpO2: 95%   BP Readings from Last 3 Encounters:  09/23/17 128/88  08/08/17 116/76  05/02/17 130/86   Wt Readings from Last 3 Encounters:  09/23/17 206 lb (93.4 kg)  05/02/17 200 lb (90.7 kg)  11/27/16 202 lb (91.6 kg)   Body mass index is 34.28 kg/m.   Physical Exam    Constitutional: Appears well-developed and well-nourished. No distress.  HENT:  Head: Normocephalic and atraumatic.  Neck: Neck supple. No tracheal deviation present. No thyromegaly present.  No cervical lymphadenopathy Cardiovascular: Normal rate, regular rhythm and normal heart sounds.    No edema Pulmonary/Chest: Effort normal and breath sounds normal. No respiratory distress. No has no wheezes. No rales. Abdomen: Obese, soft, some mild diffuse tenderness with increased tenderness bilateral sides/flanks and suprapubic region, no rebound or guarding, no masses Musculoskeletal: Mid back tenderness bilaterally-CVA area, no psoas muscle tenderness Skin: Skin is warm and dry. Not diaphoretic.  Psychiatric: Normal mood and affect. Behavior is normal.      Assessment & Plan:    See Problem List for Assessment and Plan of chronic medical problems.

## 2017-09-23 ENCOUNTER — Encounter: Payer: Self-pay | Admitting: Internal Medicine

## 2017-09-23 ENCOUNTER — Other Ambulatory Visit (INDEPENDENT_AMBULATORY_CARE_PROVIDER_SITE_OTHER): Payer: Medicare Other

## 2017-09-23 ENCOUNTER — Ambulatory Visit (INDEPENDENT_AMBULATORY_CARE_PROVIDER_SITE_OTHER): Payer: Medicare Other | Admitting: Internal Medicine

## 2017-09-23 VITALS — BP 128/88 | HR 105 | Temp 98.0°F | Resp 16 | Wt 206.0 lb

## 2017-09-23 DIAGNOSIS — N3000 Acute cystitis without hematuria: Secondary | ICD-10-CM | POA: Diagnosis not present

## 2017-09-23 DIAGNOSIS — R109 Unspecified abdominal pain: Secondary | ICD-10-CM | POA: Diagnosis not present

## 2017-09-23 DIAGNOSIS — R7303 Prediabetes: Secondary | ICD-10-CM

## 2017-09-23 DIAGNOSIS — I10 Essential (primary) hypertension: Secondary | ICD-10-CM

## 2017-09-23 DIAGNOSIS — E860 Dehydration: Secondary | ICD-10-CM | POA: Diagnosis not present

## 2017-09-23 DIAGNOSIS — E782 Mixed hyperlipidemia: Secondary | ICD-10-CM

## 2017-09-23 LAB — CBC WITH DIFFERENTIAL/PLATELET
BASOS ABS: 0 10*3/uL (ref 0.0–0.1)
BASOS PCT: 0.7 % (ref 0.0–3.0)
EOS ABS: 0.1 10*3/uL (ref 0.0–0.7)
Eosinophils Relative: 1.9 % (ref 0.0–5.0)
HCT: 29.7 % — ABNORMAL LOW (ref 36.0–46.0)
Hemoglobin: 10.3 g/dL — ABNORMAL LOW (ref 12.0–15.0)
LYMPHS ABS: 2.3 10*3/uL (ref 0.7–4.0)
Lymphocytes Relative: 32.4 % (ref 12.0–46.0)
MCHC: 34.6 g/dL (ref 30.0–36.0)
MCV: 84.6 fl (ref 78.0–100.0)
Monocytes Absolute: 0.4 10*3/uL (ref 0.1–1.0)
Monocytes Relative: 5.5 % (ref 3.0–12.0)
NEUTROS ABS: 4.1 10*3/uL (ref 1.4–7.7)
NEUTROS PCT: 59.5 % (ref 43.0–77.0)
PLATELETS: 271 10*3/uL (ref 150.0–400.0)
RBC: 3.51 Mil/uL — ABNORMAL LOW (ref 3.87–5.11)
RDW: 12.9 % (ref 11.5–15.5)
WBC: 7 10*3/uL (ref 4.0–10.5)

## 2017-09-23 LAB — COMPREHENSIVE METABOLIC PANEL
ALT: 18 U/L (ref 0–35)
AST: 17 U/L (ref 0–37)
Albumin: 3.9 g/dL (ref 3.5–5.2)
Alkaline Phosphatase: 47 U/L (ref 39–117)
BILIRUBIN TOTAL: 0.2 mg/dL (ref 0.2–1.2)
BUN: 22 mg/dL (ref 6–23)
CHLORIDE: 105 meq/L (ref 96–112)
CO2: 22 meq/L (ref 19–32)
Calcium: 9 mg/dL (ref 8.4–10.5)
Creatinine, Ser: 2.55 mg/dL — ABNORMAL HIGH (ref 0.40–1.20)
GFR: 22.42 mL/min — ABNORMAL LOW (ref >60.00)
GLUCOSE: 85 mg/dL (ref 70–99)
Potassium: 4.4 mEq/L (ref 3.5–5.1)
Sodium: 137 mEq/L (ref 135–145)
Total Protein: 7 g/dL (ref 6.0–8.3)

## 2017-09-23 LAB — LIPID PANEL
CHOLESTEROL: 191 mg/dL (ref 0–200)
HDL: 29.8 mg/dL — AB (ref >39.00)
Total CHOL/HDL Ratio: 6

## 2017-09-23 LAB — HEMOGLOBIN A1C: Hgb A1c MFr Bld: 5.7 % (ref 4.6–6.5)

## 2017-09-23 LAB — LDL CHOLESTEROL, DIRECT: Direct LDL: 64 mg/dL

## 2017-09-23 MED ORDER — SODIUM CHLORIDE 0.9 % IV SOLN
10.00 | INTRAVENOUS | Status: DC
Start: ? — End: 2017-09-23

## 2017-09-23 MED ORDER — GENERIC EXTERNAL MEDICATION
Status: DC
Start: ? — End: 2017-09-23

## 2017-09-23 NOTE — Patient Instructions (Addendum)
  Test(s) ordered today. Your results will be released to MyChart (or called to you) after review, usually within 72hours after test completion. If any changes need to be made, you will be notified at that same time.    Medications reviewed and updated.  No changes recommended at this time.     

## 2017-09-23 NOTE — Assessment & Plan Note (Signed)
Has not eaten today Taking atorvastatin 10 mg daily Check lipid panel, CMP

## 2017-09-23 NOTE — Assessment & Plan Note (Signed)
Compliant with prediabetic diet Check A1c Currently taking metformin

## 2017-09-23 NOTE — Assessment & Plan Note (Signed)
Associated with UTI Pain improved with pain medication and treatment of her UTI last month, but symptoms recurred Still with pain now, taking pain medication and antibiotic CT scan negative for kidney stone Ultrasound-transvaginal and pelvic normal Continue pain medication and let me know if pain does not continue to improve/resolve

## 2017-09-23 NOTE — Assessment & Plan Note (Signed)
Blood pressure well controlled.  Continue current medications

## 2017-09-23 NOTE — Assessment & Plan Note (Signed)
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

## 2017-09-23 NOTE — Assessment & Plan Note (Signed)
Both times and emergency room visits she did receive IV fluids-was dehydrated With her most recent blood work creatinine was elevated She is trying to continue increased fluids Check CMP

## 2017-09-24 ENCOUNTER — Encounter: Payer: Self-pay | Admitting: Internal Medicine

## 2017-09-24 NOTE — Telephone Encounter (Signed)
Appointment scheduled for Monday, 09/29/17.

## 2017-09-28 NOTE — Progress Notes (Signed)
Subjective:    Patient ID: Kayla Floyd, female    DOB: 03-20-1981, 37 y.o.   MRN: [id number]  HPI The patient is here for follow up from the ED.  Cr  7/2 in ED    2.21,  2.23       7/2 in office  2.55       7/1 in ED    1.45       6/14 in ED   0.85  Elevated Cr:  She went to the Ed 7/2 for follow up of her elevated Cr at the office that day.  She was advised to stop taking the naproxen, diclofenac, baclofen and metformin.  She did not take these, but she was miserable, but did restart the diclofenac and baclofen for her chronic pain she would like to restart the metformin when able to avoid developing ovarian cysts..    UTI, difficulty urinating: Her most recent urine culture was positive for Ecoli.  She completed her antibiotic a couple of days ago.  Her urination is back to normal - she was having difficulty urinating for a few days until she finished the antibiotic.   Her energy level is slightly better.  She denies any dysuria, increased urinary frequency or hematuria.  Mid back pain/flank pain: She still has some mid back pain - it twinges intermittently, but is not constant.  It is better - much less painful.  She denies any abdominal pain or flank pain.  Hypertriglyceridemia: She is a family history of high triglycerides and has had several readings of very high triglycerides.  Most recent blood work again showed high triglycerides.  She is taking her atorvastatin daily as prescribed.  She feels she is eating healthy and there is nothing more she can do with her diet.   Medications and allergies reviewed with patient and updated if appropriate.  Patient Active Problem List   Diagnosis Date Noted  . Dehydration 09/23/2017  . Acute cystitis without hematuria 09/23/2017  . Flank pain 09/23/2017  . Gastroparesis 10/28/2016  . Prediabetes 08/31/2016  . Genetic testing 08/29/2016  . Depression 08/28/2016  . Family history of colon cancer   . Family history of breast cancer    . Obese 03/15/2015  . Chronic migraine without aura without status migrainosus, not intractable 06/20/2014  . Tear meniscus knee 06/14/2010  . Dysautonomia - POTS 03/20/2010  . Anxiety 07/30/2009  . Essential hypertension 07/28/2009  . SYNCOPE AND COLLAPSE 07/10/2009  . PALPITATIONS 07/10/2009  . POLYCYSTIC OVARIAN DISEASE 08/31/2008  . Hypercholesterolemia with hypertriglyceridemia 08/31/2008  . GERD 08/31/2008  . Chronic back pain 08/31/2008    Current Outpatient Medications on File Prior to Visit  Medication Sig Dispense Refill  . ALPRAZolam (XANAX) 0.5 MG tablet Take 1 tablet (0.5 mg total) by mouth 2 (two) times daily as needed for anxiety. 60 tablet 5  . atenolol (TENORMIN) 25 MG tablet Take 1 tablet (25 mg total) by mouth daily. 90 tablet 3  . atorvastatin (LIPITOR) 10 MG tablet TAKE 1 TABLET BY MOUTH EVERY DAY 90 tablet 1  . b complex vitamins tablet Take 1 tablet by mouth daily.    . baclofen (LIORESAL) 10 MG tablet TAKE 1 TABLET BY MOUTH 3 TIMES A DAY AS NEEDED FOR MUSCLE SPASM 270 tablet 1  . botulinum toxin Type A (BOTOX) 100 UNITS SOLR injection Inject 100 Units into the muscle every 3 (three) months. Every 12 weeks at [facility] 2 vial   .  cetirizine (ZYRTEC) 10 MG tablet Take 10 mg by mouth daily. Reported on 07/13/2015    . diclofenac (VOLTAREN) 75 MG EC tablet TAKE 1 TABLET BY MOUTH TWICE A DAY 60 tablet 2  . DULoxetine (CYMBALTA) 60 MG capsule TAKE 1 CAPSULE BY MOUTH EVERY DAY 90 capsule 1  . Erenumab-aooe 70 MG/ML SOAJ Inject 70 mg into the skin every 30 (thirty) days.    Colleen Can 1/20 1-20 MG-MCG tablet TAKE 1 TABLET BY MOUTH EVERY DAY 63 tablet 0  . MELATONIN PO Take 6 mg by mouth daily.    . metFORMIN (GLUCOPHAGE) 1000 MG tablet TAKE 1 TABLET BY MOUTH TWICE A DAY WITH MEALS 180 tablet 1  . mirtazapine (REMERON) 45 MG tablet TAKE 1 TABLET BY MOUTH AT BEDTIME. 90 tablet 1  . Multiple Vitamin (MULTIVITAMIN) tablet Take 1 tablet by mouth daily.      Marland Kitchen  omeprazole (PRILOSEC) 40 MG capsule Take 1 capsule (40 mg total) by mouth daily. 90 capsule 3  . tiZANidine (ZANAFLEX) 4 MG tablet TAKE 1 TABLET BY MOUTH EVERY 8 HOURS AS NEEDED FOR MUSCLE SPASM 30 tablet 2  . valACYclovir (VALTREX) 1000 MG tablet Take one tablet by mouth daily. Take twice daily for 3 days early onset outbreak. 60 tablet 6  . pregabalin (LYRICA) 150 MG capsule Take by mouth 2 (two) times daily.      No current facility-administered medications on file prior to visit.     Past Medical History:  Diagnosis Date  . ANXIETY   . BACK PAIN, CHRONIC   . DYSAUTONOMIA 2011   "POTS" , recurrent syncope - follows with cards for same  . Family history of breast cancer   . Family history of colon cancer   . GERD   . 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 HYPERLIPIDEMIA   . MIGRAINE HEADACHE   . Palpitations   . POLYCYSTIC OVARIAN DISEASE   . POTS (postural orthostatic tachycardia syndrome)   . Tear meniscus knee 06/14/2010   right    Past Surgical History:  Procedure Laterality Date  . Cervical spine  2010   ACDF c5-6  . LUMBAR LAMINECTOMY  2008  . TONSILLECTOMY AND ADENOIDECTOMY      Social History   Socioeconomic History  . Marital status: Single    Spouse name: Not on file  . Number of children: Not on file  . Years of education: Not on file  . Highest education level: Not on file  Occupational History  . Not on file  Social Needs  . Financial resource strain: Not on file  . Food insecurity:    Worry: Not on file    Inability: Not on file  . Transportation needs:    Medical: Not on file    Non-medical: Not on file  Tobacco Use  . Smoking status: Former Smoker    Packs/day: 1.00    Years: 10.00    Pack years: 10.00    Last attempt to quit: 03/25/2005    Years since quitting: 12.5  . Smokeless tobacco: Never Used  Substance and Sexual Activity  . Alcohol use: Yes    Alcohol/week: 0.0 oz    Comment: occasionally  .  Drug use: No  . Sexual activity: Yes    Birth control/protection: Pill    Comment: 1st intercourse 37 yo-More than 5 partners  Lifestyle  . Physical activity:    Days per week: Not on file  Minutes per session: Not on file  . Stress: Not on file  Relationships  . Social connections:    Talks on phone: Not on file    Gets together: Not on file    Attends religious service: Not on file    Active member of club or organization: Not on file    Attends meetings of clubs or organizations: Not on file    Relationship status: Not on file  Other Topics Concern  . Not on file  Social History Narrative   Disability since 10/2012 due to POTS, 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[employer]      Some exercise on a regular exericse    Family History  Problem Relation Age of Onset  . Bipolar disorder Mother   . Stroke Mother        Age of 58 while on OCP  . Other Father        MVA  . Heart disease Father   . Heart attack Father   . Colon cancer Maternal Grandmother 27  . Cancer Maternal Grandmother   . Hypertension Sister   . Hypertension Sister   . Migraines Sister   . Breast cancer Paternal Grandmother        dx in her 30s-40s  . Cancer Paternal Uncle        ocular  . Breast cancer Paternal Aunt 32  . Lung cancer Paternal Uncle     Review of Systems  Constitutional: Positive for fatigue. Negative for chills and fever.  Respiratory: Negative for shortness of breath.   Cardiovascular: Negative for chest pain and palpitations.  Gastrointestinal: Negative for abdominal pain.  Genitourinary: Positive for pelvic pain. Negative for difficulty urinating, dysuria, frequency and hematuria.  Musculoskeletal: Positive for back pain (Mild mid back pain, intermittent).       Objective:   Vitals:   09/29/17 1300  BP: 126/88  Pulse: 100  Resp: 16  Temp: 98.4 F (36.9 C)  SpO2: 98%   BP Readings from Last 3 Encounters:  09/29/17 126/88  09/23/17 128/88  08/08/17 116/76   Wt Readings from Last  3 Encounters:  09/29/17 201 lb (91.2 kg)  09/23/17 206 lb (93.4 kg)  05/02/17 200 lb (90.7 kg)   Body mass index is 33.45 kg/m.   Physical Exam    Constitutional: Appears well-developed and well-nourished. No distress.  HENT:  Head: Normocephalic and atraumatic.  Cardiovascular: Normal rate, regular rhythm and normal heart sounds.    No edema Pulmonary/Chest: Effort normal and breath sounds normal. No respiratory distress. No has no wheezes. No rales.  Musculoskeletal: No CVA tenderness Skin: Skin is warm and dry. Not diaphoretic.  Psychiatric: Normal mood and affect. Behavior is normal.       Assessment & Plan:    See Problem List for Assessment and Plan of chronic medical problems.

## 2017-09-29 ENCOUNTER — Other Ambulatory Visit: Payer: Self-pay | Admitting: Gynecology

## 2017-09-29 ENCOUNTER — Encounter: Payer: Self-pay | Admitting: Internal Medicine

## 2017-09-29 ENCOUNTER — Other Ambulatory Visit (INDEPENDENT_AMBULATORY_CARE_PROVIDER_SITE_OTHER): Payer: Medicare Other

## 2017-09-29 ENCOUNTER — Ambulatory Visit (INDEPENDENT_AMBULATORY_CARE_PROVIDER_SITE_OTHER): Payer: Medicare Other | Admitting: Internal Medicine

## 2017-09-29 VITALS — BP 126/88 | HR 100 | Temp 98.4°F | Resp 16 | Wt 201.0 lb

## 2017-09-29 DIAGNOSIS — R7989 Other specified abnormal findings of blood chemistry: Secondary | ICD-10-CM | POA: Diagnosis not present

## 2017-09-29 DIAGNOSIS — N3000 Acute cystitis without hematuria: Secondary | ICD-10-CM

## 2017-09-29 DIAGNOSIS — R109 Unspecified abdominal pain: Secondary | ICD-10-CM

## 2017-09-29 DIAGNOSIS — E782 Mixed hyperlipidemia: Secondary | ICD-10-CM

## 2017-09-29 LAB — URINALYSIS, ROUTINE W REFLEX MICROSCOPIC
BILIRUBIN URINE: NEGATIVE
Hgb urine dipstick: NEGATIVE
KETONES UR: NEGATIVE
LEUKOCYTES UA: NEGATIVE
Nitrite: NEGATIVE
RBC / HPF: NONE SEEN (ref 0–?)
Specific Gravity, Urine: 1.02 (ref 1.000–1.030)
Total Protein, Urine: NEGATIVE
UROBILINOGEN UA: 0.2 (ref 0.0–1.0)
Urine Glucose: NEGATIVE
pH: 6 (ref 5.0–8.0)

## 2017-09-29 LAB — COMPREHENSIVE METABOLIC PANEL
ALT: 25 U/L (ref 0–35)
AST: 36 U/L (ref 0–37)
Albumin: 4.1 g/dL (ref 3.5–5.2)
Alkaline Phosphatase: 44 U/L (ref 39–117)
BUN: 11 mg/dL (ref 6–23)
CALCIUM: 9.1 mg/dL (ref 8.4–10.5)
CHLORIDE: 105 meq/L (ref 96–112)
CO2: 27 meq/L (ref 19–32)
Creatinine, Ser: 0.92 mg/dL (ref 0.40–1.20)
GFR: 72.71 mL/min (ref >60.00)
Glucose, Bld: 88 mg/dL (ref 70–99)
Potassium: 4.1 mEq/L (ref 3.5–5.1)
SODIUM: 140 meq/L (ref 135–145)
Total Bilirubin: 0.3 mg/dL (ref 0.2–1.2)
Total Protein: 7.4 g/dL (ref 6.0–8.3)

## 2017-09-29 MED ORDER — FENOFIBRATE 48 MG PO TABS: 48.0000 mg | ORAL_TABLET | Freq: Every day | ORAL | 1 refills | Status: DC

## 2017-09-29 NOTE — Patient Instructions (Addendum)
  Test(s) ordered today. Your results will be released to MyChart (or called to you) after review, usually within 72hours after test completion. If any changes need to be made, you will be notified at that same time.   Medications reviewed and updated.  Changes include starting fenofibrate.  Your prescription(s) have been submitted to your pharmacy. Please take as directed and contact our office if you believe you are having problem(s) with the medication(s).

## 2017-09-29 NOTE — Assessment & Plan Note (Signed)
Creatinine was still elevated at the emergency room-improved significantly with IV hydration Completing the antibiotic seem to improve her difficulty urinating, which was a likely the cause of the elevated creatinine She has felt that the past few days her urination has been normal She continues to drink plenty of fluids Recheck CMP-if creatinine is still elevated may need to see nephrology

## 2017-09-29 NOTE — Assessment & Plan Note (Signed)
Flank pain has resolved, still having intermittent mid back pain, but that is improving Pain medication as needed

## 2017-09-29 NOTE — Telephone Encounter (Signed)
Overdue for CE but scheduled for 10/09/17.

## 2017-09-29 NOTE — Assessment & Plan Note (Signed)
Completed antibiotic-Macrobid Given that she has had 2 instances of severe UTI/pyelonephritis in the past few months I will go ahead and recheck her urine to confirm that the infection has been successfully treated Urinalysis, urine culture

## 2017-09-29 NOTE — Assessment & Plan Note (Signed)
Given persistent hypertriglyceridemia we will start fenofibrate at a low dose Continue atorvastatin Will recheck lipid panel, CMP at her next visit

## 2017-09-30 LAB — URINE CULTURE
MICRO NUMBER:: 90804918
SPECIMEN QUALITY:: ADEQUATE

## 2017-10-05 ENCOUNTER — Other Ambulatory Visit: Payer: Self-pay | Admitting: Internal Medicine

## 2017-10-09 ENCOUNTER — Ambulatory Visit: Payer: Medicare Other | Admitting: Gynecology

## 2017-10-09 ENCOUNTER — Encounter: Payer: Self-pay | Admitting: Gynecology

## 2017-10-09 VITALS — BP 120/74 | Ht 65.0 in | Wt 200.0 lb

## 2017-10-09 DIAGNOSIS — Z01419 Encounter for gynecological examination (general) (routine) without abnormal findings: Secondary | ICD-10-CM | POA: Diagnosis not present

## 2017-10-09 DIAGNOSIS — A609 Anogenital herpesviral infection, unspecified: Secondary | ICD-10-CM | POA: Diagnosis not present

## 2017-10-09 MED ORDER — NORETHINDRONE ACET-ETHINYL EST 1-20 MG-MCG PO TABS: 1.0000 | ORAL_TABLET | Freq: Every day | ORAL | 4 refills | Status: DC

## 2017-10-09 MED ORDER — VALACYCLOVIR HCL 1 G PO TABS: ORAL_TABLET | ORAL | 5 refills | Status: DC

## 2017-10-09 NOTE — Progress Notes (Signed)
    Kayla FearingBrandi M Girgenti Dec 16, 1980 [id number]        37 y.o.  G0P0000 for annual gynecologic exam.  Was seen in May due to irregular bleeding after a number of months of amenorrhea on Loestrin 1/20 birth control pills.  This cleared spontaneously and she subsequently has not had any menses over the past 2 months still continuing on the low-dose pills.  She has had negative hCG/ultrasounds in the interim during a work-up for pyelonephritis.  Past medical history,surgical history, problem list, medications, allergies, family history and social history were all reviewed and documented as reviewed in the EPIC chart.  ROS:  Performed with pertinent positives and negatives included in the history, assessment and plan.   Additional significant findings : None   Exam: Kennon PortelaKim Gardner assistant Vitals:   10/09/17 1626  BP: 120/74  Weight: 200 lb (90.7 kg)  Height: 5\' 5"  (1.651 m)   Body mass index is 33.28 kg/m.  General appearance:  Normal affect, orientation and appearance. Skin: Grossly normal HEENT: Without gross lesions.  No cervical or supraclavicular adenopathy. Thyroid normal.  Lungs:  Clear without wheezing, rales or rhonchi Cardiac: RR, without RMG Abdominal:  Soft, nontender, without masses, guarding, rebound, organomegaly or hernia Breasts:  Examined lying and sitting without masses, retractions, discharge or axillary adenopathy. Pelvic:  Ext, BUS, Vagina: Normal  Cervix: Normal  Uterus: Anteverted, normal size, shape and contour, midline and mobile nontender   Adnexa: Without masses or tenderness    Anus and perineum: Normal   Rectovaginal: Normal sphincter tone without palpated masses or tenderness.    Assessment/Plan:  37 y.o. G0P0000 female for annual gynecologic exam without menses, oral contraceptives.   1. Amenorrhea low-dose oral contraceptives.  We have discussed that it is not unusual to see amenorrhea on low-dose pills with a progesterone dominance.  She has had a  negative hCG early July and a negative ultrasound.  She is getting married in September and contemplating pregnancy trial soon after.  Recommend she continue on her low-dose pills for now and then discontinue them after her marriage for pregnancy trial.  Preconceptual multivitamin supplementation. 2. Family history of breast cancer in grandmother and aunt.  Saw a Dentistgenetic counselor and had a negative genetic screening.  She has 2 variance of unknown significance but they did not recommend any special follow-up studies.  Calculated cancer risk less than 20% to warrant MRI.  We discussed when to start screening mammography and she plans to do so at age 37.  Breast exam normal today.  SBE monthly reviewed. 3. Pap smear/HPV 04/2015.  No Pap smear done today.  No history of abnormal Pap smears.  Plan repeat Pap smear/HPV approaching 5-year interval per current screening guidelines. 4. Occasional HSV outbreaks.  Uses Valtrex daily suppression.  Refill provided. 5. Health maintenance.  No routine lab work done as patient does this elsewhere.  Follow-up 1 year, sooner as needed.   Dara Lordsimothy P Clorene Nerio MD, 5:01 PM 10/09/2017

## 2017-10-09 NOTE — Patient Instructions (Signed)
Follow-up in 1 year for annual exam, sooner if any issues. 

## 2017-10-17 ENCOUNTER — Encounter: Payer: Self-pay | Admitting: Internal Medicine

## 2017-10-17 MED ORDER — MIRTAZAPINE 45 MG PO TABS: 45.0000 mg | ORAL_TABLET | Freq: Every day | ORAL | 1 refills | Status: DC

## 2017-10-28 ENCOUNTER — Ambulatory Visit (INDEPENDENT_AMBULATORY_CARE_PROVIDER_SITE_OTHER): Payer: Medicare Other | Admitting: Family

## 2017-10-28 ENCOUNTER — Other Ambulatory Visit: Payer: Medicare Other

## 2017-10-28 ENCOUNTER — Encounter: Payer: Self-pay | Admitting: Family

## 2017-10-28 ENCOUNTER — Ambulatory Visit: Payer: Medicare Other | Admitting: Internal Medicine

## 2017-10-28 VITALS — BP 122/78 | HR 93 | Temp 97.7°F | Ht 65.0 in | Wt 198.0 lb

## 2017-10-28 DIAGNOSIS — R300 Dysuria: Secondary | ICD-10-CM

## 2017-10-28 LAB — POC URINALSYSI DIPSTICK (AUTOMATED)
BILIRUBIN UA: NEGATIVE
GLUCOSE UA: NEGATIVE
KETONES UA: NEGATIVE
Leukocytes, UA: NEGATIVE
NITRITE UA: NEGATIVE
Protein, UA: NEGATIVE
RBC UA: NEGATIVE
SPEC GRAV UA: 1.015 (ref 1.010–1.025)
Urobilinogen, UA: 0.2 E.U./dL
pH, UA: 6 (ref 5.0–8.0)

## 2017-10-28 MED ORDER — NITROFURANTOIN MONOHYD MACRO 100 MG PO CAPS: 100.0000 mg | ORAL_CAPSULE | Freq: Two times a day (BID) | ORAL | 0 refills | Status: DC

## 2017-10-28 NOTE — Progress Notes (Signed)
Kayla Floyd is a 37 y.o. female with the following history as recorded in EpicCare:  Patient Active Problem List   Diagnosis Date Noted  . Elevated serum creatinine 09/29/2017  . Dehydration 09/23/2017  . Acute cystitis without hematuria 09/23/2017  . Flank pain 09/23/2017  . Gastroparesis 10/28/2016  . Prediabetes 08/31/2016  . Genetic testing 08/29/2016  . Depression 08/28/2016  . Family history of colon cancer   . Family history of breast cancer   . Obese 03/15/2015  . Chronic migraine without aura without status migrainosus, not intractable 06/20/2014  . Tear meniscus knee 06/14/2010  . Dysautonomia - POTS 03/20/2010  . Anxiety 07/30/2009  . Essential hypertension 07/28/2009  . SYNCOPE AND COLLAPSE 07/10/2009  . PALPITATIONS 07/10/2009  . POLYCYSTIC OVARIAN DISEASE 08/31/2008  . Hypercholesterolemia with hypertriglyceridemia 08/31/2008  . GERD 08/31/2008  . Chronic back pain 08/31/2008    Current Outpatient Medications  Medication Sig Dispense Refill  . ALPRAZolam (XANAX) 0.5 MG tablet Take 1 tablet (0.5 mg total) by mouth 2 (two) times daily as needed for anxiety. 60 tablet 5  . atenolol (TENORMIN) 25 MG tablet Take 1 tablet (25 mg total) by mouth daily. 90 tablet 3  . atorvastatin (LIPITOR) 10 MG tablet TAKE 1 TABLET BY MOUTH EVERY DAY 90 tablet 1  . b complex vitamins tablet Take 1 tablet by mouth daily.    . baclofen (LIORESAL) 10 MG tablet TAKE 1 TABLET BY MOUTH 3 TIMES A DAY AS NEEDED FOR MUSCLE SPASM 270 tablet 1  . botulinum toxin Type A (BOTOX) 100 UNITS SOLR injection Inject 100 Units into the muscle every 3 (three) months. Every 12 weeks at [facility] 2 vial   . cetirizine (ZYRTEC) 10 MG tablet Take 10 mg by mouth daily. Reported on 07/13/2015    . diclofenac (VOLTAREN) 75 MG EC tablet TAKE 1 TABLET BY MOUTH TWICE A DAY 60 tablet 2  . DULoxetine (CYMBALTA) 60 MG capsule TAKE 1 CAPSULE BY MOUTH EVERY DAY 90 capsule 1  . fenofibrate (TRICOR) 48 MG tablet  Take 1 tablet (48 mg total) by mouth daily. 90 tablet 1  . ibuprofen (ADVIL,MOTRIN) 200 MG tablet Take by mouth.    Marland Kitchen LYRICA 150 MG capsule Take 150 mg by mouth 2 (two) times daily.  3  . MELATONIN PO Take 6 mg by mouth daily.    . metFORMIN (GLUCOPHAGE) 1000 MG tablet TAKE 1 TABLET BY MOUTH TWICE A DAY WITH MEALS 180 tablet 1  . mirtazapine (REMERON) 45 MG tablet Take 1 tablet (45 mg total) by mouth at bedtime. 90 tablet 1  . Multiple Vitamin (MULTIVITAMIN) tablet Take 1 tablet by mouth daily.      . norethindrone-ethinyl estradiol (JUNEL 1/20) 1-20 MG-MCG tablet Take 1 tablet by mouth daily. 63 tablet 4  . omeprazole (PRILOSEC) 40 MG capsule Take 1 capsule (40 mg total) by mouth daily. 90 capsule 3  . promethazine (PHENERGAN) 12.5 MG tablet Take by mouth.    Marland Kitchen tiZANidine (ZANAFLEX) 4 MG tablet TAKE 1 TABLET BY MOUTH EVERY 8 HOURS AS NEEDED FOR MUSCLE SPASM 30 tablet 2  . valACYclovir (VALTREX) 1000 MG tablet TAKE ONE TABLET BY MOUTH DAILY. TAKE TWICE DAILY FOR 3 DAYS FOR EARLY ONSET OUTBREAK. 60 tablet 5  . nitrofurantoin, macrocrystal-monohydrate, (MACROBID) 100 MG capsule Take 1 capsule (100 mg total) by mouth 2 (two) times daily. 14 capsule 0  . pregabalin (LYRICA) 150 MG capsule Take by mouth 2 (two) times daily.  No current facility-administered medications for this visit.     Allergies: Beef-derived products; Glycerol, iodinated; and Pork-derived products  Past Medical History:  Diagnosis Date  . ANXIETY   . BACK PAIN, CHRONIC   . DYSAUTONOMIA 2011   "POTS" , recurrent syncope - follows with cards for same  . GERD   . 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 HYPERLIPIDEMIA   . Kidney failure   . MIGRAINE HEADACHE   . Palpitations   . POLYCYSTIC OVARIAN DISEASE   . POTS (postural orthostatic tachycardia syndrome)   . Tear meniscus knee 06/14/2010   right    Past Surgical History:  Procedure Laterality Date  . Cervical spine  2010   ACDF  c5-6  . LUMBAR LAMINECTOMY  2008  . TONSILLECTOMY AND ADENOIDECTOMY      Family History  Problem Relation Age of Onset  . Bipolar disorder Mother   . Stroke Mother        Age of 75 while on OCP  . Other Father        MVA  . Heart disease Father   . Heart attack Father   . Colon cancer Maternal Grandmother 19  . Cancer Maternal Grandmother   . Hypertension Sister   . Hypertension Sister   . Migraines Sister   . Breast cancer Paternal Grandmother        dx in her 30s-40s  . Cancer Paternal Uncle        ocular  . Breast cancer Paternal Aunt 59  . Lung cancer Paternal Uncle     Social History   Tobacco Use  . Smoking status: Former Smoker    Packs/day: 1.00    Years: 10.00    Pack years: 10.00    Last attempt to quit: 03/25/2005    Years since quitting: 12.6  . Smokeless tobacco: Never Used  Substance Use Topics  . Alcohol use: Yes    Alcohol/week: 0.0 oz    Comment: occasionally    Subjective:  Patient presents with concerns for UTI; yesterday, while urinating, she suddenly developed "excruciating" low back pain/ urinary pain; has been struggling with recurrent UTIs in the past month; last urine culture was clear approximately one month ago; was concerned about waiting on symptoms due to recent elevated creatinine level; feels somewhat better today but still some discomfort.  Objective:  Vitals:   10/28/17 1104  BP: 122/78  Pulse: 93  Temp: 97.7 F (36.5 C)  TempSrc: Oral  SpO2: 99%  Weight: 198 lb 0.6 oz (89.8 kg)  Height: 5\' 5"  (1.651 m)    General: Well developed, well nourished, in no acute distress  Skin : Warm and dry.  Head: Normocephalic and atraumatic  Lungs: Respirations unlabored; clear to auscultation bilaterally without wheeze, rales, rhonchi  CVS exam: normal rate and regular rhythm.  Neurologic: Alert and oriented; speech intact; face symmetrical; moves all extremities well; CNII-XII intact without focal deficit   Assessment:  1. Dysuria      Plan:  Update urine culture today; Rx for Macrobid 100 mg bid x 7days; if urine culture is clear, needs to consider urology referral.    No follow-ups on file.  Orders Placed This Encounter  Procedures  . Urine Culture    Standing Status:   Future    Standing Expiration Date:   10/28/2018    Requested Prescriptions   Signed Prescriptions Disp Refills  . nitrofurantoin, macrocrystal-monohydrate, (MACROBID) 100 MG capsule 14 capsule  0    Sig: Take 1 capsule (100 mg total) by mouth 2 (two) times daily.

## 2017-10-28 NOTE — Addendum Note (Signed)
Addended by: Karma GanjaSMITH, CARLA J on: 10/28/2017 11:58 AM   Modules accepted: Orders

## 2017-10-29 ENCOUNTER — Other Ambulatory Visit: Payer: Self-pay | Admitting: Family

## 2017-10-29 DIAGNOSIS — R300 Dysuria: Secondary | ICD-10-CM

## 2017-10-29 LAB — URINE CULTURE
MICRO NUMBER:: 90928482
SPECIMEN QUALITY:: ADEQUATE

## 2017-10-29 NOTE — Telephone Encounter (Signed)
Patient would like to move forward with referral.  Thanks

## 2017-10-31 ENCOUNTER — Other Ambulatory Visit: Payer: Self-pay | Admitting: Internal Medicine

## 2017-10-31 ENCOUNTER — Ambulatory Visit: Payer: Medicare Other

## 2017-10-31 ENCOUNTER — Ambulatory Visit: Payer: Medicare Other | Admitting: Internal Medicine

## 2017-11-08 ENCOUNTER — Other Ambulatory Visit: Payer: Self-pay | Admitting: Internal Medicine

## 2017-11-09 ENCOUNTER — Other Ambulatory Visit: Payer: Self-pay | Admitting: Internal Medicine

## 2017-12-21 NOTE — Progress Notes (Signed)
Subjective:    Patient ID: Kayla Floyd, female    DOB: November 17, 1980, 37 y.o.   MRN: [id number]  HPI The patient is here for follow up.  POTS, Hypertension: She is taking her medication daily. She is compliant with a low sodium diet.  She does experience occasional chest pain and palpitations, which is not new.  She will experience shortness of breath with going up the stairs, which is also not new.  She does not experience shortness of breath walking on a level surface.  She does have headaches intermittently.  She states lightheadedness if he stands up quickly and sometimes dizziness.  She is not exercising regularly.      Prediabetes:  She is fairly compliant with a low sugar/carbohydrate diet.  She is not exercising regularly.  Hyperlipidemia: She is taking her medication daily. She is compliant with a low fat/cholesterol diet. She is not exercising regularly. She denies myalgias.   Anxiety: She is taking her Cymbalta daily as prescribed. She denies any side effects from the medication. She feels her anxiety is well controlled and she is happy with her current dose of medication.   Depression: She is taking her Cymbalta daily as prescribed. She denies any side effects from the medication. She feels her depression is well controlled and she is happy with her current dose of medication.   She is experiencing dysuria that is intermittent with some urinary urgency.  She will have the symptoms, takes an over-the-counter medication and within 24 hours they go away. She has had this 3-4 times since she was here last.  Chronic back pain: She continues to take Lyrica daily and tizanidine as needed.  She does need refills.  She feels medication is working well.  She is going back to get recertified and needs a letter of release with specific accommodations.  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  I advised no ICU or Med-surg floor.  She feels she needs a safety net so that is she needs to be  able to step out if needed.  Needs the opportunity to sit for 15 minutes every few hours or if needed if symptomatic.  Needs to be able to have water bottle with her at work to stay hydrated.  Needs to start working 4 hours in a day for now with the hopes of increasing with time.    Medications and allergies reviewed with patient and updated if appropriate.  Patient Active Problem List   Diagnosis Date Noted  . Elevated serum creatinine 09/29/2017  . Acute cystitis without hematuria 09/23/2017  . Flank pain 09/23/2017  . Gastroparesis 10/28/2016  . Prediabetes 08/31/2016  . Genetic testing 08/29/2016  . Depression 08/28/2016  . Family history of colon cancer   . Family history of breast cancer   . Obese 03/15/2015  . Chronic migraine without aura without status migrainosus, not intractable 06/20/2014  . Tear meniscus knee 06/14/2010  . Dysautonomia - POTS 03/20/2010  . Anxiety 07/30/2009  . Essential hypertension 07/28/2009  . SYNCOPE AND COLLAPSE 07/10/2009  . PALPITATIONS 07/10/2009  . POLYCYSTIC OVARIAN DISEASE 08/31/2008  . Hypercholesterolemia with hypertriglyceridemia 08/31/2008  . GERD 08/31/2008  . Chronic back pain 08/31/2008    Current Outpatient Medications on File Prior to Visit  Medication Sig Dispense Refill  . ALPRAZolam (XANAX) 0.5 MG tablet Take 1 tablet (0.5 mg total) by mouth 2 (two) times daily as needed for anxiety. 60 tablet 5  . atenolol (TENORMIN) 25 MG  tablet TAKE 1 TABLET BY MOUTH EVERY DAY 90 tablet 3  . atorvastatin (LIPITOR) 10 MG tablet TAKE 1 TABLET BY MOUTH EVERY DAY 90 tablet 1  . b complex vitamins tablet Take 1 tablet by mouth daily.    . baclofen (LIORESAL) 10 MG tablet TAKE 1 TABLET BY MOUTH 3 TIMES A DAY AS NEEDED FOR MUSCLE SPASM 270 tablet 1  . botulinum toxin Type A (BOTOX) 100 UNITS SOLR injection Inject 100 Units into the muscle every 3 (three) months. Every 12 weeks at [facility] 2 vial   . cetirizine (ZYRTEC) 10 MG tablet Take 10  mg by mouth daily. Reported on 07/13/2015    . diclofenac (VOLTAREN) 75 MG EC tablet TAKE 1 TABLET BY MOUTH TWICE A DAY 60 tablet 2  . DULoxetine (CYMBALTA) 60 MG capsule TAKE 1 CAPSULE BY MOUTH EVERY DAY 90 capsule 1  . fenofibrate (TRICOR) 48 MG tablet Take 1 tablet (48 mg total) by mouth daily. 90 tablet 1  . ibuprofen (ADVIL,MOTRIN) 200 MG tablet Take by mouth.    Marland Kitchen LYRICA 150 MG capsule Take 150 mg by mouth 2 (two) times daily.  3  . MELATONIN PO Take 6 mg by mouth daily.    . metFORMIN (GLUCOPHAGE) 1000 MG tablet TAKE 1 TABLET BY MOUTH TWICE A DAY WITH MEALS 180 tablet 1  . mirtazapine (REMERON) 45 MG tablet Take 1 tablet (45 mg total) by mouth at bedtime. 90 tablet 1  . Multiple Vitamin (MULTIVITAMIN) tablet Take 1 tablet by mouth daily.      . norethindrone-ethinyl estradiol (JUNEL 1/20) 1-20 MG-MCG tablet Take 1 tablet by mouth daily. 63 tablet 4  . omeprazole (PRILOSEC) 40 MG capsule TAKE 1 CAPSULE BY MOUTH EVERY DAY 90 capsule 3  . promethazine (PHENERGAN) 12.5 MG tablet Take by mouth.    Marland Kitchen tiZANidine (ZANAFLEX) 4 MG tablet TAKE 1 TABLET BY MOUTH EVERY 8 HOURS AS NEEDED FOR MUSCLE SPASM 30 tablet 2  . valACYclovir (VALTREX) 1000 MG tablet TAKE ONE TABLET BY MOUTH DAILY. TAKE TWICE DAILY FOR 3 DAYS FOR EARLY ONSET OUTBREAK. 60 tablet 5  . pregabalin (LYRICA) 150 MG capsule Take by mouth 2 (two) times daily.      No current facility-administered medications on file prior to visit.     Past Medical History:  Diagnosis Date  . ANXIETY   . BACK PAIN, CHRONIC   . DYSAUTONOMIA 2011   "POTS" , recurrent syncope - follows with cards for same  . GERD   . 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 HYPERLIPIDEMIA   . Kidney failure   . MIGRAINE HEADACHE   . Palpitations   . POLYCYSTIC OVARIAN DISEASE   . POTS (postural orthostatic tachycardia syndrome)   . Tear meniscus knee 06/14/2010   right    Past Surgical History:  Procedure Laterality Date  .  Cervical spine  2010   ACDF c5-6  . LUMBAR LAMINECTOMY  2008  . TONSILLECTOMY AND ADENOIDECTOMY      Social History   Socioeconomic History  . Marital status: Married    Spouse name: Not on file  . Number of children: Not on file  . Years of education: Not on file  . Highest education level: Not on file  Occupational History  . Not on file  Social Needs  . Financial resource strain: Not on file  . Food insecurity:    Worry: Not on file    Inability:  Not on file  . Transportation needs:    Medical: Not on file    Non-medical: Not on file  Tobacco Use  . Smoking status: Former Smoker    Packs/day: 1.00    Years: 10.00    Pack years: 10.00    Last attempt to quit: 03/25/2005    Years since quitting: 12.7  . Smokeless tobacco: Never Used  Substance and Sexual Activity  . Alcohol use: Yes    Alcohol/week: 0.0 standard drinks    Comment: occasionally  . Drug use: No  . Sexual activity: Yes    Birth control/protection: Pill    Comment: 1st intercourse 37 yo-More than 5 partners  Lifestyle  . Physical activity:    Days per week: Not on file    Minutes per session: Not on file  . Stress: Not on file  Relationships  . Social connections:    Talks on phone: Not on file    Gets together: Not on file    Attends religious service: Not on file    Active member of club or organization: Not on file    Attends meetings of clubs or organizations: Not on file    Relationship status: Not on file  Other Topics Concern  . Not on file  Social History Narrative   Disability since 10/2012 due to POTS, 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[employer]      Some exercise on a regular exericse    Family History  Problem Relation Age of Onset  . Bipolar disorder Mother   . Stroke Mother        Age of 72 while on OCP  . Other Father        MVA  . Heart disease Father   . Heart attack Father   . Colon cancer Maternal Grandmother 53  . Cancer Maternal Grandmother   . Hypertension Sister   .  Hypertension Sister   . Migraines Sister   . Breast cancer Paternal Grandmother        dx in her 30s-40s  . Cancer Paternal Uncle        ocular  . Breast cancer Paternal Aunt 85  . Lung cancer Paternal Uncle     Review of Systems  Constitutional: Negative for fever.  Respiratory: Positive for shortness of breath (with stairs). Negative for cough and wheezing.   Cardiovascular: Positive for chest pain (occ) and palpitations (occ ). Negative for leg swelling.  Neurological: Positive for dizziness, light-headedness (when she stands up) and headaches.       Objective:   Vitals:   12/23/17 1507  BP: 122/78  Pulse: 86  Resp: 16  Temp: 98.7 F (37.1 C)  SpO2: 98%   BP Readings from Last 3 Encounters:  12/23/17 122/78  10/28/17 122/78  10/09/17 120/74   Wt Readings from Last 3 Encounters:  12/23/17 200 lb 12.8 oz (91.1 kg)  10/28/17 198 lb 0.6 oz (89.8 kg)  10/09/17 200 lb (90.7 kg)   Body mass index is 33.41 kg/m.   Physical Exam    Constitutional: Appears well-developed and well-nourished. No distress.  HENT:  Head: Normocephalic and atraumatic.  Neck: Neck supple. No tracheal deviation present. No thyromegaly present.  No cervical lymphadenopathy Cardiovascular: Normal rate, regular rhythm and normal heart sounds.   No murmur heard. No carotid bruit .  No edema Pulmonary/Chest: Effort normal and breath sounds normal. No respiratory distress. No has no wheezes. No rales.  Skin: Skin is warm and dry. Not  diaphoretic.  Psychiatric: Normal mood and affect. Behavior is normal.      Assessment & Plan:    See Problem List for Assessment and Plan of chronic medical problems.

## 2017-12-22 NOTE — Progress Notes (Deleted)
Subjective:   Kayla Floyd is a 37 y.o. female who presents for Medicare Annual (Subsequent) preventive examination.  Review of Systems:  No ROS.  Medicare Wellness Visit. Additional risk factors are reflected in the social history.    Sleep patterns: {SX; SLEEP PATTERNS:18802::"feels rested on waking","does not get up to void","gets up *** times nightly to void","sleeps *** hours nightly"}.    Home Safety/Smoke Alarms: Feels safe in home. Smoke alarms in place.  Living environment; residence and Firearm Safety: {Rehab home environment / accessibility:30080::"no firearms","firearms stored safely"}. Seat Belt Safety/Bike Helmet: Wears seat belt.    Objective:     Vitals: There were no vitals taken for this visit.  There is no height or weight on file to calculate BMI.  No flowsheet data found.  Tobacco Social History   Tobacco Use  Smoking Status Former Smoker  . Packs/day: 1.00  . Years: 10.00  . Pack years: 10.00  . Last attempt to quit: 03/25/2005  . Years since quitting: 12.7  Smokeless Tobacco Never Used     Counseling given: Not Answered  Past Medical History:  Diagnosis Date  . ANXIETY   . BACK PAIN, CHRONIC   . DYSAUTONOMIA 2011   "POTS" , recurrent syncope - follows with cards for same  . GERD   . 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 HYPERLIPIDEMIA   . Kidney failure   . MIGRAINE HEADACHE   . Palpitations   . POLYCYSTIC OVARIAN DISEASE   . POTS (postural orthostatic tachycardia syndrome)   . Tear meniscus knee 06/14/2010   right   Past Surgical History:  Procedure Laterality Date  . Cervical spine  2010   ACDF c5-6  . LUMBAR LAMINECTOMY  2008  . TONSILLECTOMY AND ADENOIDECTOMY     Family History  Problem Relation Age of Onset  . Bipolar disorder Mother   . Stroke Mother        Age of 2 while on OCP  . Other Father        MVA  . Heart disease Father   . Heart attack Father   . Colon cancer Maternal  Grandmother 75  . Cancer Maternal Grandmother   . Hypertension Sister   . Hypertension Sister   . Migraines Sister   . Breast cancer Paternal Grandmother        dx in her 30s-40s  . Cancer Paternal Uncle        ocular  . Breast cancer Paternal Aunt 10  . Lung cancer Paternal Uncle    Social History   Socioeconomic History  . Marital status: Single    Spouse name: Not on file  . Number of children: Not on file  . Years of education: Not on file  . Highest education level: Not on file  Occupational History  . Not on file  Social Needs  . Financial resource strain: Not on file  . Food insecurity:    Worry: Not on file    Inability: Not on file  . Transportation needs:    Medical: Not on file    Non-medical: Not on file  Tobacco Use  . Smoking status: Former Smoker    Packs/day: 1.00    Years: 10.00    Pack years: 10.00    Last attempt to quit: 03/25/2005    Years since quitting: 12.7  . Smokeless tobacco: Never Used  Substance and Sexual Activity  . Alcohol use: Yes    Alcohol/week:  0.0 standard drinks    Comment: occasionally  . Drug use: No  . Sexual activity: Yes    Birth control/protection: Pill    Comment: 1st intercourse 37 yo-More than 5 partners  Lifestyle  . Physical activity:    Days per week: Not on file    Minutes per session: Not on file  . Stress: Not on file  Relationships  . Social connections:    Talks on phone: Not on file    Gets together: Not on file    Attends religious service: Not on file    Active member of club or organization: Not on file    Attends meetings of clubs or organizations: Not on file    Relationship status: Not on file  Other Topics Concern  . 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      Some exercise on a regular exericse    Outpatient Encounter Medications as of 12/23/2017  Medication Sig  . ALPRAZolam (XANAX) 0.5 MG tablet Take 1 tablet (0.5 mg  total) by mouth 2 (two) times daily as needed for anxiety.  Marland Kitchen atenolol (TENORMIN) 25 MG tablet TAKE 1 TABLET BY MOUTH EVERY DAY  . atorvastatin (LIPITOR) 10 MG tablet TAKE 1 TABLET BY MOUTH EVERY DAY  . b complex vitamins tablet Take 1 tablet by mouth daily.  . baclofen (LIORESAL) 10 MG tablet TAKE 1 TABLET BY MOUTH 3 TIMES A DAY AS NEEDED FOR MUSCLE SPASM  . botulinum toxin Type A (BOTOX) 100 UNITS SOLR injection Inject 100 Units into the muscle every 3 (three) months. Every 12 weeks at [facility]  . cetirizine (ZYRTEC) 10 MG tablet Take 10 mg by mouth daily. Reported on 07/13/2015  . diclofenac (VOLTAREN) 75 MG EC tablet TAKE 1 TABLET BY MOUTH TWICE A DAY  . DULoxetine (CYMBALTA) 60 MG capsule TAKE 1 CAPSULE BY MOUTH EVERY DAY  . fenofibrate (TRICOR) 48 MG tablet Take 1 tablet (48 mg total) by mouth daily.  Marland Kitchen ibuprofen (ADVIL,MOTRIN) 200 MG tablet Take by mouth.  Marland Kitchen LYRICA 150 MG capsule Take 150 mg by mouth 2 (two) times daily.  Marland Kitchen MELATONIN PO Take 6 mg by mouth daily.  . metFORMIN (GLUCOPHAGE) 1000 MG tablet TAKE 1 TABLET BY MOUTH TWICE A DAY WITH MEALS  . mirtazapine (REMERON) 45 MG tablet Take 1 tablet (45 mg total) by mouth at bedtime.  . Multiple Vitamin (MULTIVITAMIN) tablet Take 1 tablet by mouth daily.    . norethindrone-ethinyl estradiol (JUNEL 1/20) 1-20 MG-MCG tablet Take 1 tablet by mouth daily.  Marland Kitchen omeprazole (PRILOSEC) 40 MG capsule TAKE 1 CAPSULE BY MOUTH EVERY DAY  . pregabalin (LYRICA) 150 MG capsule Take by mouth 2 (two) times daily.   . promethazine (PHENERGAN) 12.5 MG tablet Take by mouth.  Marland Kitchen tiZANidine (ZANAFLEX) 4 MG tablet TAKE 1 TABLET BY MOUTH EVERY 8 HOURS AS NEEDED FOR MUSCLE SPASM  . valACYclovir (VALTREX) 1000 MG tablet TAKE ONE TABLET BY MOUTH DAILY. TAKE TWICE DAILY FOR 3 DAYS FOR EARLY ONSET OUTBREAK.   No facility-administered encounter medications on file as of 12/23/2017.     Activities of Daily Living No flowsheet data found.  Patient Care  Team: Binnie Rail, MD as PCP - General (Internal Medicine) Minus Breeding, MD (Cardiology) Marchia Bond, MD (Orthopedic Surgery) Everardo Pacific, [place] (Nurse Practitioner)    Assessment:   This is a routine wellness examination for Lezlee.  Physical assessment deferred to PCP.   Exercise Activities and Dietary recommendations   Diet (meal preparation, eat out, water intake, caffeinated beverages, dairy products, fruits and vegetables): {Desc; diets:16563}   Goals   None     Fall Risk Fall Risk  03/29/2015 06/20/2014  Falls in the past year? Yes Yes  Number falls in past yr: 2 or more 1  Injury with Fall? Yes No  Risk Factor Category  High Fall Risk -  Risk for fall due to : History of fall(s);Impaired balance/gait;Impaired mobility -  Follow up Falls evaluation completed;Falls prevention discussed Falls prevention discussed   Depression Screen PHQ 2/9 Scores 06/20/2014  PHQ - 2 Score 0     Cognitive Function        Immunization History  Administered Date(s) Administered  . DTP 03/31/1980, 05/02/1980, 06/07/1980, 07/05/1981, 11/09/1984  . Influenza Whole 12/23/2008  . MMR 04/25/1981, 08/26/2003  . OPV 03/31/1980, 06/07/1980, 08/09/1980, 07/05/1981, 11/09/1984  . Td 11/11/1994, 03/25/2008   Screening Tests Health Maintenance  Topic Date Due  . INFLUENZA VACCINE  01/02/2018 (Originally 10/23/2017)  . TETANUS/TDAP  03/25/2018  . PAP SMEAR  05/04/2018  . HIV Screening  Completed      Plan:     I have personally reviewed and noted the following in the patient's chart:   . Medical and social history . Use of alcohol, tobacco or illicit drugs  . Current medications and supplements . Functional ability and status . Nutritional status . Physical activity . Advanced directives . List of other physicians . Vitals . Screenings to include cognitive, depression, and falls . Referrals and appointments  In addition, I have reviewed and discussed with  patient certain preventive protocols, quality metrics, and best practice recommendations. A written personalized care plan for preventive services as well as general preventive health recommendations were provided to patient.     Michiel Cowboy, RN  12/22/2017

## 2017-12-23 ENCOUNTER — Ambulatory Visit: Payer: Medicare Other

## 2017-12-23 ENCOUNTER — Ambulatory Visit (INDEPENDENT_AMBULATORY_CARE_PROVIDER_SITE_OTHER): Payer: Medicare Other | Admitting: Internal Medicine

## 2017-12-23 ENCOUNTER — Other Ambulatory Visit (INDEPENDENT_AMBULATORY_CARE_PROVIDER_SITE_OTHER): Payer: Medicare Other

## 2017-12-23 ENCOUNTER — Encounter: Payer: Self-pay | Admitting: Internal Medicine

## 2017-12-23 VITALS — BP 122/78 | HR 86 | Temp 98.7°F | Resp 16 | Ht 65.0 in | Wt 200.8 lb

## 2017-12-23 DIAGNOSIS — I10 Essential (primary) hypertension: Secondary | ICD-10-CM | POA: Diagnosis not present

## 2017-12-23 DIAGNOSIS — E782 Mixed hyperlipidemia: Secondary | ICD-10-CM | POA: Diagnosis not present

## 2017-12-23 DIAGNOSIS — F3289 Other specified depressive episodes: Secondary | ICD-10-CM | POA: Diagnosis not present

## 2017-12-23 DIAGNOSIS — R7303 Prediabetes: Secondary | ICD-10-CM

## 2017-12-23 DIAGNOSIS — F419 Anxiety disorder, unspecified: Secondary | ICD-10-CM | POA: Diagnosis not present

## 2017-12-23 DIAGNOSIS — G901 Familial dysautonomia [Riley-Day]: Secondary | ICD-10-CM

## 2017-12-23 DIAGNOSIS — M549 Dorsalgia, unspecified: Secondary | ICD-10-CM

## 2017-12-23 DIAGNOSIS — R300 Dysuria: Secondary | ICD-10-CM

## 2017-12-23 DIAGNOSIS — G8929 Other chronic pain: Secondary | ICD-10-CM

## 2017-12-23 DIAGNOSIS — Z23 Encounter for immunization: Secondary | ICD-10-CM

## 2017-12-23 LAB — COMPREHENSIVE METABOLIC PANEL
ALBUMIN: 4.5 g/dL (ref 3.5–5.2)
ALT: 31 U/L (ref 0–35)
AST: 45 U/L — AB (ref 0–37)
Alkaline Phosphatase: 36 U/L — ABNORMAL LOW (ref 39–117)
BUN: 12 mg/dL (ref 6–23)
CHLORIDE: 102 meq/L (ref 96–112)
CO2: 25 meq/L (ref 19–32)
CREATININE: 0.93 mg/dL (ref 0.40–1.20)
Calcium: 9.8 mg/dL (ref 8.4–10.5)
GFR: 71.72 mL/min (ref >60.00)
Glucose, Bld: 84 mg/dL (ref 70–99)
POTASSIUM: 4.2 meq/L (ref 3.5–5.1)
SODIUM: 138 meq/L (ref 135–145)
Total Bilirubin: 0.3 mg/dL (ref 0.2–1.2)
Total Protein: 7.9 g/dL (ref 6.0–8.3)

## 2017-12-23 LAB — LIPID PANEL
CHOL/HDL RATIO: 7
CHOLESTEROL: 229 mg/dL — AB (ref 0–200)
HDL: 32.2 mg/dL — ABNORMAL LOW (ref >39.00)

## 2017-12-23 LAB — CBC WITH DIFFERENTIAL/PLATELET
BASOS ABS: 0.1 10*3/uL (ref 0.0–0.1)
Basophils Relative: 0.7 % (ref 0.0–3.0)
EOS PCT: 1.9 % (ref 0.0–5.0)
Eosinophils Absolute: 0.2 10*3/uL (ref 0.0–0.7)
HCT: 34.4 % — ABNORMAL LOW (ref 36.0–46.0)
Hemoglobin: 11.5 g/dL — ABNORMAL LOW (ref 12.0–15.0)
LYMPHS ABS: 3.3 10*3/uL (ref 0.7–4.0)
Lymphocytes Relative: 39.4 % (ref 12.0–46.0)
MCHC: 33.4 g/dL (ref 30.0–36.0)
MCV: 83.3 fl (ref 78.0–100.0)
MONO ABS: 0.4 10*3/uL (ref 0.1–1.0)
Monocytes Relative: 4.3 % (ref 3.0–12.0)
NEUTROS ABS: 4.6 10*3/uL (ref 1.4–7.7)
Neutrophils Relative %: 53.7 % (ref 43.0–77.0)
PLATELETS: 305 10*3/uL (ref 150.0–400.0)
RBC: 4.14 Mil/uL (ref 3.87–5.11)
RDW: 13.5 % (ref 11.5–15.5)
WBC: 8.5 10*3/uL (ref 4.0–10.5)

## 2017-12-23 LAB — LDL CHOLESTEROL, DIRECT: LDL DIRECT: 115 mg/dL

## 2017-12-23 LAB — HEMOGLOBIN A1C: HEMOGLOBIN A1C: 5.7 % (ref 4.6–6.5)

## 2017-12-23 MED ORDER — PREGABALIN 150 MG PO CAPS: 150.0000 mg | ORAL_CAPSULE | Freq: Two times a day (BID) | ORAL | 11 refills | Status: DC

## 2017-12-23 MED ORDER — TIZANIDINE HCL 4 MG PO TABS: ORAL_TABLET | ORAL | 2 refills | Status: DC

## 2017-12-23 MED ORDER — DULOXETINE HCL 60 MG PO CPEP: ORAL_CAPSULE | ORAL | 1 refills | Status: DC

## 2017-12-23 NOTE — Patient Instructions (Signed)
  Tests ordered today. Your results will be released to MyChart (or called to you) after review, usually within 72hours after test completion. If any changes need to be made, you will be notified at that same time.  Flu and tetanus immunizations administered today.   Medications reviewed and updated.  Changes include :   none  Your prescription(s) have been submitted to your pharmacy. Please take as directed and contact our office if you believe you are having problem(s) with the medication(s).  A referral was ordered for Urology  Please followup in 6 months

## 2017-12-23 NOTE — Assessment & Plan Note (Signed)
Check lipid panel  Continue daily statin Regular exercise and healthy diet encouraged  

## 2017-12-23 NOTE — Assessment & Plan Note (Signed)
Controlled, stable Continue current dose of medication  

## 2017-12-23 NOTE — Assessment & Plan Note (Signed)
Recurrent dysuria Did have infections over the summer, but last urinalysis did not reveal an infection No current symptoms so I will not check her urine today Interested in seeing urology-referred

## 2017-12-23 NOTE — Assessment & Plan Note (Signed)
BP well controlled Current regimen effective and well tolerated Continue current medications at current doses cmp  

## 2017-12-23 NOTE — Assessment & Plan Note (Signed)
Following with cardiology-management per them Continue current medications Secondary to dysautonomia/pots she does need restrictions for going back to clinicals-we will write letter

## 2017-12-23 NOTE — Assessment & Plan Note (Signed)
Check a1c Low sugar / carb diet Stressed regular exercise, weight loss  

## 2017-12-23 NOTE — Assessment & Plan Note (Addendum)
She continues to have chronic back pain.  She takes Lyrica daily.  She takes tizanidine only as needed.  She does take baclofen as needed only and does not take this when she takes the tizanidine.  Overall back pain controlled We will continue current regimen

## 2017-12-24 ENCOUNTER — Encounter: Payer: Self-pay | Admitting: Internal Medicine

## 2017-12-29 MED ORDER — FENOFIBRATE 145 MG PO TABS: 145.0000 mg | ORAL_TABLET | Freq: Every day | ORAL | 1 refills | Status: DC

## 2018-01-29 ENCOUNTER — Other Ambulatory Visit: Payer: Self-pay | Admitting: Internal Medicine

## 2018-02-20 ENCOUNTER — Other Ambulatory Visit: Payer: Self-pay | Admitting: Internal Medicine

## 2018-03-04 ENCOUNTER — Encounter: Payer: Self-pay | Admitting: Internal Medicine

## 2018-03-17 ENCOUNTER — Other Ambulatory Visit: Payer: Self-pay | Admitting: Internal Medicine

## 2018-03-26 DIAGNOSIS — M9905 Segmental and somatic dysfunction of pelvic region: Secondary | ICD-10-CM | POA: Diagnosis not present

## 2018-03-26 DIAGNOSIS — M9902 Segmental and somatic dysfunction of thoracic region: Secondary | ICD-10-CM | POA: Diagnosis not present

## 2018-03-26 DIAGNOSIS — M9901 Segmental and somatic dysfunction of cervical region: Secondary | ICD-10-CM | POA: Diagnosis not present

## 2018-03-26 DIAGNOSIS — M9903 Segmental and somatic dysfunction of lumbar region: Secondary | ICD-10-CM | POA: Diagnosis not present

## 2018-03-26 DIAGNOSIS — M9906 Segmental and somatic dysfunction of lower extremity: Secondary | ICD-10-CM | POA: Diagnosis not present

## 2018-03-27 DIAGNOSIS — M9902 Segmental and somatic dysfunction of thoracic region: Secondary | ICD-10-CM | POA: Diagnosis not present

## 2018-03-27 DIAGNOSIS — M9903 Segmental and somatic dysfunction of lumbar region: Secondary | ICD-10-CM | POA: Diagnosis not present

## 2018-03-27 DIAGNOSIS — M9906 Segmental and somatic dysfunction of lower extremity: Secondary | ICD-10-CM | POA: Diagnosis not present

## 2018-03-27 DIAGNOSIS — M9905 Segmental and somatic dysfunction of pelvic region: Secondary | ICD-10-CM | POA: Diagnosis not present

## 2018-03-27 DIAGNOSIS — M9901 Segmental and somatic dysfunction of cervical region: Secondary | ICD-10-CM | POA: Diagnosis not present

## 2018-03-30 DIAGNOSIS — M9906 Segmental and somatic dysfunction of lower extremity: Secondary | ICD-10-CM | POA: Diagnosis not present

## 2018-03-30 DIAGNOSIS — M9902 Segmental and somatic dysfunction of thoracic region: Secondary | ICD-10-CM | POA: Diagnosis not present

## 2018-03-30 DIAGNOSIS — M9905 Segmental and somatic dysfunction of pelvic region: Secondary | ICD-10-CM | POA: Diagnosis not present

## 2018-03-30 DIAGNOSIS — M9903 Segmental and somatic dysfunction of lumbar region: Secondary | ICD-10-CM | POA: Diagnosis not present

## 2018-03-30 DIAGNOSIS — M9901 Segmental and somatic dysfunction of cervical region: Secondary | ICD-10-CM | POA: Diagnosis not present

## 2018-04-03 DIAGNOSIS — M9901 Segmental and somatic dysfunction of cervical region: Secondary | ICD-10-CM | POA: Diagnosis not present

## 2018-04-03 DIAGNOSIS — M9903 Segmental and somatic dysfunction of lumbar region: Secondary | ICD-10-CM | POA: Diagnosis not present

## 2018-04-03 DIAGNOSIS — M9902 Segmental and somatic dysfunction of thoracic region: Secondary | ICD-10-CM | POA: Diagnosis not present

## 2018-04-03 DIAGNOSIS — M9906 Segmental and somatic dysfunction of lower extremity: Secondary | ICD-10-CM | POA: Diagnosis not present

## 2018-04-03 DIAGNOSIS — M9905 Segmental and somatic dysfunction of pelvic region: Secondary | ICD-10-CM | POA: Diagnosis not present

## 2018-04-17 ENCOUNTER — Other Ambulatory Visit: Payer: Self-pay | Admitting: Gynecology

## 2018-04-17 NOTE — Telephone Encounter (Signed)
Yes I would go to the 500 mg daily and then twice daily as needed for outbreaks

## 2018-04-17 NOTE — Telephone Encounter (Signed)
I thought the patient was taking 500 mg daily not 1000 mg

## 2018-04-17 NOTE — Telephone Encounter (Signed)
When you prescribed it for her back at 10/09/17 visit you prescribed the 1000 mg. Do you want to change that?

## 2018-04-17 NOTE — Telephone Encounter (Signed)
Okay for 1000 mg then

## 2018-04-17 NOTE — Telephone Encounter (Signed)
I called patient to let her know we were going to send 500mg . She said you had actually changed it thought a MY CHart email because 500mg  was not longer suppressive. (02/15/2017) : "Take Valtrex 1000 mg daily as a suppressive dose to see if this does not suppress her recurrences.  She can increase it to twice daily at the earliest sign of an outbreak to see if she does not shorten an outbreak.  Prescribed #60 with 6 refills."

## 2018-05-13 ENCOUNTER — Other Ambulatory Visit: Payer: Self-pay | Admitting: Internal Medicine

## 2018-05-18 ENCOUNTER — Other Ambulatory Visit: Payer: Self-pay | Admitting: Internal Medicine

## 2018-05-28 DIAGNOSIS — G43709 Chronic migraine without aura, not intractable, without status migrainosus: Secondary | ICD-10-CM | POA: Diagnosis not present

## 2018-06-11 ENCOUNTER — Other Ambulatory Visit: Payer: Self-pay | Admitting: Internal Medicine

## 2018-06-25 NOTE — Progress Notes (Signed)
Virtual Visit via Video Note  I connected with Kayla Floyd on 06/25/18 at  3:00 PM EDT by a video enabled telemedicine application and verified that I am speaking with the correct person using two identifiers.   I discussed the limitations of evaluation and management by telemedicine and the availability of in person appointments. The patient expressed understanding and agreed to proceed.  The patient is currently at home and I am in the office.    No referring provider.    History of Present Illness: She is here for follow up of her chronic medical conditions.   She is not exercising regularly.    POTS, Hypertension: She is taking her medication daily. She is somewhat compliant with a low sodium diet.  She has chronic chest pain and palpitations related to her POTS which is unchanged.  She has intermittent headaches and lightheadedness.  She denies edema, shortness of breath.     Prediabetes:  She is somewhat compliant with a low sugar/carbohydrate diet.  Recently she has been eating more sugar.  She is not exercising regularly.  Hyperlipidemia: She is taking her medication daily. She is fairly compliant with a low fat/cholesterol diet. She denies myalgias.   Anxiety: She is taking her cymbalta daily as prescribed. She denies any side effects from the medication. She feels her anxiety is well controlled and she is happy with her current dose of medication.   Depression: She is taking her medication daily as prescribed. She denies any side effects from the medication. She feels her depression is well controlled and she is happy with her current dose of medication.   Chronic back pain: She is taking Cymbalta daily, Lyrica daily and tizanidine as needed.  Overall she feels her pain is controlled with her current regimen.  What has really helped her back pain over the past several months was going to a chiropractor 3/week for adjustments.  She has also been doing massage therapy once a  month.    She denies fever or chills, wheeze or SOB.  She does have a mild cough that she feels is related to allergies.  She has occasional dysphagia with saliva, drinking water and eating.  It has increased in frequency.      Observations/Objective: Appears well in NAD  Assessment and Plan:  See Problem List for Assessment and Plan of chronic medical problems.   Follow Up Instructions:    I discussed the assessment and treatment plan with the patient. The patient was provided an opportunity to ask questions and all were answered. The patient agreed with the plan and demonstrated an understanding of the instructions.   The patient was advised to call back or seek an in-person evaluation if the symptoms worsen or if the condition fails to improve as anticipated.  She will follow-up with me in 6 months.   Pincus Sanes, MD

## 2018-06-26 ENCOUNTER — Ambulatory Visit (INDEPENDENT_AMBULATORY_CARE_PROVIDER_SITE_OTHER): Payer: Medicare Other | Admitting: Internal Medicine

## 2018-06-26 DIAGNOSIS — R7303 Prediabetes: Secondary | ICD-10-CM

## 2018-06-26 DIAGNOSIS — M549 Dorsalgia, unspecified: Secondary | ICD-10-CM | POA: Diagnosis not present

## 2018-06-26 DIAGNOSIS — G901 Familial dysautonomia [Riley-Day]: Secondary | ICD-10-CM | POA: Diagnosis not present

## 2018-06-26 DIAGNOSIS — F419 Anxiety disorder, unspecified: Secondary | ICD-10-CM | POA: Diagnosis not present

## 2018-06-26 DIAGNOSIS — F3289 Other specified depressive episodes: Secondary | ICD-10-CM

## 2018-06-26 DIAGNOSIS — I10 Essential (primary) hypertension: Secondary | ICD-10-CM | POA: Diagnosis not present

## 2018-06-26 DIAGNOSIS — R1310 Dysphagia, unspecified: Secondary | ICD-10-CM

## 2018-06-26 DIAGNOSIS — E782 Mixed hyperlipidemia: Secondary | ICD-10-CM

## 2018-06-26 DIAGNOSIS — G8929 Other chronic pain: Secondary | ICD-10-CM

## 2018-06-26 MED ORDER — PREGABALIN 150 MG PO CAPS: 150.0000 mg | ORAL_CAPSULE | Freq: Two times a day (BID) | ORAL | 1 refills | Status: DC

## 2018-06-27 ENCOUNTER — Encounter: Payer: Self-pay | Admitting: Internal Medicine

## 2018-06-27 DIAGNOSIS — R1310 Dysphagia, unspecified: Secondary | ICD-10-CM | POA: Insufficient documentation

## 2018-06-27 NOTE — Assessment & Plan Note (Signed)
She states difficulty swallowing saliva, fluids and food at times Increasing in frequency If gets much worse will need to see GI immediately, but given current coronavirus situation we both feel okay to just monitor at this time She will let me know if this worsens

## 2018-06-27 NOTE — Assessment & Plan Note (Addendum)
BP Readings from Last 3 Encounters:  12/23/17 122/78  10/28/17 122/78  10/09/17 120/74   Has been well controlled overall  Continue current meds

## 2018-06-27 NOTE — Assessment & Plan Note (Signed)
Stressed low sugar/carbohydrate diet She will work on increasing her activity now that her back pain is better Will recheck A1c in 6 months

## 2018-06-27 NOTE — Assessment & Plan Note (Signed)
Following with cardiology Continue current medications Overall stable

## 2018-06-27 NOTE — Assessment & Plan Note (Signed)
Continue atorvastatin

## 2018-06-27 NOTE — Assessment & Plan Note (Signed)
Stable, controlled Continue Cymbalta 60 mg daily 

## 2018-06-27 NOTE — Assessment & Plan Note (Signed)
Stable, controlled Continue Cymbalta 60 mg daily

## 2018-06-27 NOTE — Assessment & Plan Note (Signed)
Improved with chiropractic adjustments and massage therapy She will continue this on a maintenance basis Continue baclofen/tizanidine as needed Continue Lyrica, Cymbalta

## 2018-07-03 ENCOUNTER — Encounter: Payer: Self-pay | Admitting: Internal Medicine

## 2018-07-04 MED ORDER — NITROFURANTOIN MONOHYD MACRO 100 MG PO CAPS: 100.0000 mg | ORAL_CAPSULE | Freq: Two times a day (BID) | ORAL | 0 refills | Status: DC

## 2018-07-12 ENCOUNTER — Other Ambulatory Visit: Payer: Self-pay | Admitting: Internal Medicine

## 2018-07-15 ENCOUNTER — Encounter: Payer: Self-pay | Admitting: Internal Medicine

## 2018-07-16 MED ORDER — SULFAMETHOXAZOLE-TRIMETHOPRIM 800-160 MG PO TABS: 1.0000 | ORAL_TABLET | Freq: Two times a day (BID) | ORAL | 0 refills | Status: AC

## 2018-07-21 DIAGNOSIS — E785 Hyperlipidemia, unspecified: Secondary | ICD-10-CM | POA: Diagnosis not present

## 2018-07-21 DIAGNOSIS — Z87891 Personal history of nicotine dependence: Secondary | ICD-10-CM | POA: Diagnosis not present

## 2018-07-21 DIAGNOSIS — Z79899 Other long term (current) drug therapy: Secondary | ICD-10-CM | POA: Diagnosis not present

## 2018-07-21 DIAGNOSIS — N390 Urinary tract infection, site not specified: Secondary | ICD-10-CM | POA: Diagnosis not present

## 2018-07-21 DIAGNOSIS — M791 Myalgia, unspecified site: Secondary | ICD-10-CM | POA: Diagnosis not present

## 2018-07-21 DIAGNOSIS — K219 Gastro-esophageal reflux disease without esophagitis: Secondary | ICD-10-CM | POA: Diagnosis not present

## 2018-07-21 DIAGNOSIS — R300 Dysuria: Secondary | ICD-10-CM | POA: Diagnosis not present

## 2018-07-21 DIAGNOSIS — R350 Frequency of micturition: Secondary | ICD-10-CM | POA: Diagnosis not present

## 2018-07-21 DIAGNOSIS — Z888 Allergy status to other drugs, medicaments and biological substances status: Secondary | ICD-10-CM | POA: Diagnosis not present

## 2018-07-21 DIAGNOSIS — Z91018 Allergy to other foods: Secondary | ICD-10-CM | POA: Diagnosis not present

## 2018-08-03 ENCOUNTER — Encounter: Payer: Self-pay | Admitting: Internal Medicine

## 2018-08-03 MED ORDER — PREGABALIN 150 MG PO CAPS: 150.0000 mg | ORAL_CAPSULE | Freq: Two times a day (BID) | ORAL | 0 refills | Status: DC

## 2018-08-08 ENCOUNTER — Other Ambulatory Visit: Payer: Self-pay | Admitting: Internal Medicine

## 2018-08-27 DIAGNOSIS — G43709 Chronic migraine without aura, not intractable, without status migrainosus: Secondary | ICD-10-CM | POA: Diagnosis not present

## 2018-10-07 ENCOUNTER — Other Ambulatory Visit: Payer: Self-pay | Admitting: Internal Medicine

## 2018-10-09 DIAGNOSIS — Z7984 Long term (current) use of oral hypoglycemic drugs: Secondary | ICD-10-CM | POA: Diagnosis not present

## 2018-10-09 DIAGNOSIS — K8020 Calculus of gallbladder without cholecystitis without obstruction: Secondary | ICD-10-CM | POA: Diagnosis not present

## 2018-10-09 DIAGNOSIS — K760 Fatty (change of) liver, not elsewhere classified: Secondary | ICD-10-CM | POA: Diagnosis not present

## 2018-10-09 DIAGNOSIS — K8080 Other cholelithiasis without obstruction: Secondary | ICD-10-CM | POA: Diagnosis not present

## 2018-10-09 DIAGNOSIS — R1011 Right upper quadrant pain: Secondary | ICD-10-CM | POA: Diagnosis not present

## 2018-10-09 DIAGNOSIS — Z888 Allergy status to other drugs, medicaments and biological substances status: Secondary | ICD-10-CM | POA: Diagnosis not present

## 2018-10-09 DIAGNOSIS — R1012 Left upper quadrant pain: Secondary | ICD-10-CM | POA: Diagnosis not present

## 2018-10-09 DIAGNOSIS — E785 Hyperlipidemia, unspecified: Secondary | ICD-10-CM | POA: Diagnosis not present

## 2018-10-09 DIAGNOSIS — Z87891 Personal history of nicotine dependence: Secondary | ICD-10-CM | POA: Diagnosis not present

## 2018-10-09 DIAGNOSIS — Z91018 Allergy to other foods: Secondary | ICD-10-CM | POA: Diagnosis not present

## 2018-10-09 DIAGNOSIS — K219 Gastro-esophageal reflux disease without esophagitis: Secondary | ICD-10-CM | POA: Diagnosis not present

## 2018-10-09 DIAGNOSIS — Z87892 Personal history of anaphylaxis: Secondary | ICD-10-CM | POA: Diagnosis not present

## 2018-10-09 DIAGNOSIS — R1084 Generalized abdominal pain: Secondary | ICD-10-CM | POA: Diagnosis not present

## 2018-10-09 DIAGNOSIS — Z79899 Other long term (current) drug therapy: Secondary | ICD-10-CM | POA: Diagnosis not present

## 2018-10-13 ENCOUNTER — Encounter: Payer: Medicare Other | Admitting: Gynecology

## 2018-10-13 DIAGNOSIS — Z87891 Personal history of nicotine dependence: Secondary | ICD-10-CM | POA: Diagnosis not present

## 2018-10-13 DIAGNOSIS — R1084 Generalized abdominal pain: Secondary | ICD-10-CM | POA: Diagnosis not present

## 2018-10-13 DIAGNOSIS — Z9109 Other allergy status, other than to drugs and biological substances: Secondary | ICD-10-CM | POA: Diagnosis not present

## 2018-10-13 DIAGNOSIS — E785 Hyperlipidemia, unspecified: Secondary | ICD-10-CM | POA: Diagnosis not present

## 2018-10-13 DIAGNOSIS — Z91018 Allergy to other foods: Secondary | ICD-10-CM | POA: Diagnosis not present

## 2018-10-13 DIAGNOSIS — Z888 Allergy status to other drugs, medicaments and biological substances status: Secondary | ICD-10-CM | POA: Diagnosis not present

## 2018-10-13 DIAGNOSIS — Z79899 Other long term (current) drug therapy: Secondary | ICD-10-CM | POA: Diagnosis not present

## 2018-10-13 DIAGNOSIS — K219 Gastro-esophageal reflux disease without esophagitis: Secondary | ICD-10-CM | POA: Diagnosis not present

## 2018-10-13 DIAGNOSIS — R253 Fasciculation: Secondary | ICD-10-CM | POA: Diagnosis not present

## 2018-10-19 ENCOUNTER — Telehealth: Payer: Self-pay | Admitting: Internal Medicine

## 2018-10-19 NOTE — Progress Notes (Signed)
Virtual Visit via Video Note  I connected with Kayla Floyd on 10/20/18 at  1:00 PM EDT by a video enabled telemedicine application and verified that I am speaking with the correct person using two identifiers.   I discussed the limitations of evaluation and management by telemedicine and the availability of in person appointments. The patient expressed understanding and agreed to proceed.  The patient is currently at home and I am in the office.    No referring provider.    History of Present Illness: This visit is for follow up of her ED visit.  She is also having COVID like symptoms.    ED 10/09/18 for b/l flank pain radiating to b/l upper abdomen.  Her pain had sudden onset 30 min prior to going to the ED.  She had UA, blood work and CT of her abdomen and pelvis.  The CT scan showed no concerning findings.  She has no urinary stones or hydronephrosis.  She did have hepatic steatosis and cholelithiasis.  She had mild anemia. Her other blood work, urine test were normal.    ED 10/13/18 for facial twitching and tingling, abdominal pain and nausea.  She had left facial and jaw muscle twitching that lasted 30 seconds.  She also had cramping in her right hand at that time.  She has had intermittent abdominal pain since she was in the ED last.  The pain is in her entire abdomen.  No cause for muscle twitching was found.  She had normal calcium and other labs were stable.    COVID symptoms:  She has had some symptoms suggestive of COVID.  She is still having abdominal pain, which is constant at a level of 2-3/10.  It waxes and wanes in intensity.  The entire abdomen hurts.  She is having diarrhea.  She has lost 4-5 lbs in the past week.  She is eating and drinking normally and does not feel dehydrated.  She does still have some nausea.   Review of Systems  Constitutional: Negative for chills and fever (low grade 99.6).  HENT: Positive for sore throat.   Respiratory: Positive for cough (chronic,  no change).   Gastrointestinal: Positive for abdominal pain (constant but waxes and wanes with intensity), diarrhea and nausea.  Musculoskeletal: Positive for joint pain and myalgias (muscle aches, muscle twitching).  Neurological: Positive for headaches.      Social History   Socioeconomic History  . Marital status: Married    Spouse name: Not on file  . Number of children: Not on file  . Years of education: Not on file  . Highest education level: Not on file  Occupational History  . Not on file  Social Needs  . Financial resource strain: Not on file  . Food insecurity    Worry: Not on file    Inability: Not on file  . Transportation needs    Medical: Not on file    Non-medical: Not on file  Tobacco Use  . Smoking status: Former Smoker    Packs/day: 1.00    Years: 10.00    Pack years: 10.00    Quit date: 03/25/2005    Years since quitting: 13.5  . Smokeless tobacco: Never Used  Substance and Sexual Activity  . Alcohol use: Yes    Alcohol/week: 0.0 standard drinks    Comment: occasionally  . Drug use: No  . Sexual activity: Yes    Birth control/protection: Pill    Comment: 1st intercourse 38 yo-More than  5 partners  Lifestyle  . Physical activity    Days per week: Not on file    Minutes per session: Not on file  . Stress: Not on file  Relationships  . Social Herbalist on phone: Not on file    Gets together: Not on file    Attends religious service: Not on file    Active member of club or organization: Not on file    Attends meetings of clubs or organizations: Not on file    Relationship status: Not on file  Other Topics Concern  . Not on file  Social History Narrative   Disability since 10/2012 due to POTS, 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[employer]      Some exercise on a regular exericse     Observations/Objective: Appears well in NAD   Assessment and Plan:  See Problem List for Assessment and Plan of chronic medical problems.   Follow Up  Instructions:    I discussed the assessment and treatment plan with the patient. The patient was provided an opportunity to ask questions and all were answered. The patient agreed with the plan and demonstrated an understanding of the instructions.   The patient was advised to call back or seek an in-person evaluation if the symptoms worsen or if the condition fails to improve as anticipated.    Binnie Rail, MD

## 2018-10-19 NOTE — Telephone Encounter (Signed)
Patient called team health 7/26 at 5:46pm.  Stating that she went to the ED last Friday with severe ABD pain and was dx with gallstones.  Went back to ED on Tuesday for facial and muscle twitching and spasms and thought was due to calcium levels.  Had nausea that worsened, muscle aches, sore throat, feeling of weakness and shaky, mild headache off and on.  Denied cough and SOB.  Doesn't have issues with taste or smell.  Was told to call PCP now.  Pt is scheduled for a doxy visit with patient on 7/28.

## 2018-10-20 ENCOUNTER — Ambulatory Visit (INDEPENDENT_AMBULATORY_CARE_PROVIDER_SITE_OTHER): Payer: Medicare Other | Admitting: Internal Medicine

## 2018-10-20 ENCOUNTER — Encounter: Payer: Medicare Other | Admitting: Gynecology

## 2018-10-20 ENCOUNTER — Encounter: Payer: Self-pay | Admitting: Internal Medicine

## 2018-10-20 DIAGNOSIS — J029 Acute pharyngitis, unspecified: Secondary | ICD-10-CM | POA: Insufficient documentation

## 2018-10-20 DIAGNOSIS — M791 Myalgia, unspecified site: Secondary | ICD-10-CM | POA: Insufficient documentation

## 2018-10-20 DIAGNOSIS — Z87891 Personal history of nicotine dependence: Secondary | ICD-10-CM | POA: Diagnosis not present

## 2018-10-20 DIAGNOSIS — R197 Diarrhea, unspecified: Secondary | ICD-10-CM | POA: Diagnosis not present

## 2018-10-20 DIAGNOSIS — Z1159 Encounter for screening for other viral diseases: Secondary | ICD-10-CM | POA: Diagnosis not present

## 2018-10-20 DIAGNOSIS — R1084 Generalized abdominal pain: Secondary | ICD-10-CM

## 2018-10-20 DIAGNOSIS — R509 Fever, unspecified: Secondary | ICD-10-CM | POA: Diagnosis not present

## 2018-10-20 DIAGNOSIS — R109 Unspecified abdominal pain: Secondary | ICD-10-CM | POA: Diagnosis not present

## 2018-10-20 DIAGNOSIS — K8020 Calculus of gallbladder without cholecystitis without obstruction: Secondary | ICD-10-CM | POA: Diagnosis not present

## 2018-10-20 DIAGNOSIS — K829 Disease of gallbladder, unspecified: Secondary | ICD-10-CM | POA: Diagnosis not present

## 2018-10-20 NOTE — Assessment & Plan Note (Signed)
Having increased myalgia, muscle twitches ?  Cause Blood work in the ED was unremarkable ?  Related to GI symptoms or URI symptoms

## 2018-10-20 NOTE — Assessment & Plan Note (Signed)
She is having diarrhea ?  Gastroenteritis versus COVID Also having diffuse abdominal pain-does not seem typical for COVID and more likely to be a GI process Refer to GI She is maintaining good hydration

## 2018-10-20 NOTE — Assessment & Plan Note (Signed)
She is experiencing a sore throat and other symptoms possibly suggestive of COVID Low-grade fever, cough that is fairly chronic, diarrhea, nausea and some increase in muscle aches Discussed getting tested for COVID in the [place] area, which is where she lives-she can go to 1 of the several places that are testing there without a referral or appointment

## 2018-10-20 NOTE — Assessment & Plan Note (Signed)
She went to the ED twice in the past couple of weeks for abdominal pain CT of the abdomen pelvis showed cholelithiasis and possible calcification in the appendix Continues to have constant 2-3/10 intensity pain with waxing and waning increased intensity-pain is diffuse Having diarrhea ?  Related to possible COVID given her other symptoms, not typical for the gallbladder or appendix Will refer to GI She will consider getting tested for COVID

## 2018-10-25 ENCOUNTER — Other Ambulatory Visit: Payer: Self-pay | Admitting: Gynecology

## 2018-10-26 ENCOUNTER — Telehealth: Payer: Self-pay | Admitting: Internal Medicine

## 2018-10-26 MED ORDER — PREGABALIN 150 MG PO CAPS: 150.0000 mg | ORAL_CAPSULE | Freq: Two times a day (BID) | ORAL | 0 refills | Status: DC

## 2018-10-26 NOTE — Telephone Encounter (Signed)
Last refill was 08/04/18 Last OV 06/26/18 Next OV NA

## 2018-10-26 NOTE — Telephone Encounter (Signed)
Pharmacy called for pt to request a refill of  pregabalin (LYRICA) 150 MG capsule  Pt has already called as well.    [id number] - [address], [address] [phone] (Phone) [phone] (Fax)

## 2018-10-26 NOTE — Addendum Note (Signed)
Addended by: Delice Bison E on: 10/26/2018 11:33 AM   Modules accepted: Orders

## 2018-10-26 NOTE — Addendum Note (Signed)
Addended by: Binnie Rail on: 10/26/2018 01:37 PM   Modules accepted: Orders

## 2018-10-26 NOTE — Telephone Encounter (Signed)
Pt states that pregabalin (LYRICA) 150 MG capsule was called into the wrong pharmacy and she needs to have it sent to  [id number] - [place], [place] [phone] (Phone) [phone] (Fax)   Pt states that she has been without since Friday and she feels really bad now.

## 2018-10-27 ENCOUNTER — Other Ambulatory Visit: Payer: Self-pay

## 2018-11-18 ENCOUNTER — Other Ambulatory Visit: Payer: Self-pay | Admitting: Internal Medicine

## 2018-11-20 ENCOUNTER — Ambulatory Visit (INDEPENDENT_AMBULATORY_CARE_PROVIDER_SITE_OTHER): Payer: Medicare Other | Admitting: Gastroenterology

## 2018-11-20 ENCOUNTER — Other Ambulatory Visit: Payer: Self-pay | Admitting: Internal Medicine

## 2018-11-20 ENCOUNTER — Encounter: Payer: Self-pay | Admitting: Gastroenterology

## 2018-11-20 VITALS — BP 102/64 | HR 92 | Temp 97.6°F | Ht 65.0 in | Wt 190.0 lb

## 2018-11-20 DIAGNOSIS — R194 Change in bowel habit: Secondary | ICD-10-CM

## 2018-11-20 DIAGNOSIS — R140 Abdominal distension (gaseous): Secondary | ICD-10-CM | POA: Diagnosis not present

## 2018-11-20 DIAGNOSIS — R1084 Generalized abdominal pain: Secondary | ICD-10-CM

## 2018-11-20 DIAGNOSIS — K8020 Calculus of gallbladder without cholecystitis without obstruction: Secondary | ICD-10-CM | POA: Diagnosis not present

## 2018-11-20 MED ORDER — HYOSCYAMINE SULFATE 0.125 MG SL SUBL: 0.1250 mg | SUBLINGUAL_TABLET | Freq: Four times a day (QID) | SUBLINGUAL | 1 refills | Status: DC | PRN
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
## 2018-11-20 NOTE — Patient Instructions (Signed)
If you are age 39 or older, your body mass index should be between 23-30. Your Body mass index is 31.62 kg/m. If this is out of the aforementioned range listed, please consider follow up with your Primary Care Provider.  If you are age 82 or younger, your body mass index should be between 19-25. Your Body mass index is 31.62 kg/m. If this is out of the aformentioned range listed, please consider follow up with your Primary Care Provider.   Please call us in a few weeks with an update. [phone] or send [place] a message on [facility].   It was a pleasure to see you today!  Dr. Loletha Carrow

## 2018-11-24 ENCOUNTER — Other Ambulatory Visit: Payer: Self-pay | Admitting: Internal Medicine

## 2018-11-24 NOTE — Telephone Encounter (Signed)
Last refill was 10/26/18 Last OV 06/26/18 Next OV NA

## 2018-11-27 ENCOUNTER — Ambulatory Visit (INDEPENDENT_AMBULATORY_CARE_PROVIDER_SITE_OTHER): Payer: Medicare Other | Admitting: Gynecology

## 2018-11-27 ENCOUNTER — Other Ambulatory Visit: Payer: Self-pay

## 2018-11-27 ENCOUNTER — Encounter: Payer: Self-pay | Admitting: Gynecology

## 2018-11-27 VITALS — BP 120/78 | Ht 64.0 in | Wt 196.0 lb

## 2018-11-27 DIAGNOSIS — N3000 Acute cystitis without hematuria: Secondary | ICD-10-CM

## 2018-11-27 DIAGNOSIS — R300 Dysuria: Secondary | ICD-10-CM

## 2018-11-27 DIAGNOSIS — A609 Anogenital herpesviral infection, unspecified: Secondary | ICD-10-CM | POA: Diagnosis not present

## 2018-11-27 DIAGNOSIS — N390 Urinary tract infection, site not specified: Secondary | ICD-10-CM

## 2018-11-27 DIAGNOSIS — Z01419 Encounter for gynecological examination (general) (routine) without abnormal findings: Secondary | ICD-10-CM

## 2018-11-27 MED ORDER — VALACYCLOVIR HCL 1 G PO TABS: ORAL_TABLET | ORAL | 2 refills | Status: DC

## 2018-11-27 MED ORDER — NORETHINDRONE ACET-ETHINYL EST 1-20 MG-MCG PO TABS: 1.0000 | ORAL_TABLET | Freq: Every day | ORAL | 4 refills | Status: DC

## 2018-11-27 MED ORDER — CIPROFLOXACIN HCL 250 MG PO TABS: 250.0000 mg | ORAL_TABLET | Freq: Two times a day (BID) | ORAL | 0 refills | Status: DC

## 2018-11-27 MED ORDER — NITROFURANTOIN MONOHYD MACRO 100 MG PO CAPS: 100.0000 mg | ORAL_CAPSULE | Freq: Once | ORAL | 1 refills | Status: AC

## 2018-11-27 NOTE — Progress Notes (Signed)
    Kayla Floyd 08-16-1980 [id number]        38 y.o.  G0P0000 for annual gynecologic exam.  Is having issues with recurrent urinary tract infections with intercourse.  Saw urology and was told everything was normal.  Currently having frequency dysuria urgency.  No fever chills low back pain.  Past medical history,surgical history, problem list, medications, allergies, family history and social history were all reviewed and documented as reviewed in the EPIC chart.  ROS:  Performed with pertinent positives and negatives included in the history, assessment and plan.   Additional significant findings : None   Exam: Copywriter, advertising Vitals:   11/27/18 1551  BP: 120/78  Weight: 196 lb (88.9 kg)  Height: 5\' 4"  (1.626 m)   Body mass index is 33.64 kg/m.  General appearance:  Normal affect, orientation and appearance. Skin: Grossly normal HEENT: Without gross lesions.  No cervical or supraclavicular adenopathy. Thyroid normal.  Lungs:  Clear without wheezing, rales or rhonchi Cardiac: RR, without RMG Abdominal:  Soft, nontender, without masses, guarding, rebound, organomegaly or hernia Breasts:  Examined lying and sitting without masses, retractions, discharge or axillary adenopathy. Pelvic:  Ext, BUS, Vagina: Normal  Cervix: Normal  Uterus: Anteverted, normal size, shape and contour, midline and mobile nontender   Adnexa: Without masses or tenderness    Anus and perineum: Normal   Rectovaginal: Normal sphincter tone without palpated masses or tenderness.    Assessment/Plan:  38 y.o. G0P0000 female for annual gynecologic exam.  With regular menses on oral contraceptives  1. Postcoital UTIs with current UTI.  Urine analysis consistent with UTI.  Relates being treated with Macrodantin in the past and ultimately always going to Cipro.  We will go ahead and treat her acutely with ciprofloxacin 250 mg twice daily x7 days.  Black box warning reviewed.  Will prophylax with Macrobid 100  mg with intercourse x1 pill #30 with 1 refill.  She will follow-up if she continues to have recurrent UTIs. 2. Loestrin 1/20 equivalent.  Doing well wants to continue.  Refill x1 year provided. 3. Family history of breast cancer in grandmother and aunt.  Saw genetic counselor and was negative for genetic screening.  She did have 2 variants of unknown significance but they did not recommend any special follow-up studies.  Plans to start mammograms at age 51.  Breast exam normal today.  SBE monthly reviewed. 4. Pap smear/HPV 2017.  No Pap smear done today.  No history of abnormal Pap smears.  Plan repeat Pap smear/HPV at 5-year interval per current screening guidelines. 5. Occasional HSV.  Takes 1000 mg Valtrex daily for suppression.  Had breakthroughs at 500 mg.  Refill x1 year provided. 6. Health maintenance.  No routine lab work done as patient does this elsewhere.  Follow-up 1 year, sooner as needed.   Anastasio Auerbach MD, 4:13 PM 11/27/2018

## 2018-11-27 NOTE — Patient Instructions (Signed)
Take the ciprofloxacin antibiotic twice daily for 7 days.  Take the Macrodantin antibiotic pill once with intercourse to hopefully prevent recurrent UTIs.  Call if it continues to be an issue.  Follow-up in 1 year for annual exam

## 2018-11-29 LAB — URINALYSIS, COMPLETE W/RFL CULTURE
Bilirubin Urine: NEGATIVE
Glucose, UA: NEGATIVE
Hyaline Cast: NONE SEEN /LPF
Ketones, ur: NEGATIVE
Nitrites, Initial: POSITIVE — AB
Protein, ur: NEGATIVE
Specific Gravity, Urine: 1.02 (ref 1.001–1.03)
WBC, UA: >=60 /HPF — AB (ref 0–5)
pH: 6.5 (ref 5.0–8.0)

## 2018-11-29 LAB — URINE CULTURE
MICRO NUMBER:: 853455
SPECIMEN QUALITY:: ADEQUATE

## 2018-11-29 LAB — CULTURE INDICATED

## 2018-12-01 ENCOUNTER — Telehealth: Payer: Self-pay

## 2018-12-01 ENCOUNTER — Encounter: Payer: Medicare Other | Admitting: Gynecology

## 2018-12-01 DIAGNOSIS — Z0289 Encounter for other administrative examinations: Secondary | ICD-10-CM

## 2018-12-01 DIAGNOSIS — G43709 Chronic migraine without aura, not intractable, without status migrainosus: Secondary | ICD-10-CM | POA: Diagnosis not present

## 2018-12-01 NOTE — Telephone Encounter (Signed)
Spoke with patient and informed her. SHe said she is much better not 100%.  She said her urine is still cloudy but odor gone. No more burning. 4th day of Cipro.

## 2018-12-01 NOTE — Telephone Encounter (Signed)
I would recommend completing the course of the ciprofloxacin and then see how she feels.

## 2018-12-01 NOTE — Telephone Encounter (Signed)
-----   Message from Anastasio Auerbach, MD sent at 12/01/2018  8:09 AM EDT ----- Check with patient to see how she is feeling.  Her urine culture showed a resistance to ciprofloxacin which is what I prescribed but many times it still was adequate enough to treat the infection.  If she is still symptomatic then recommend Macrobid twice daily x7 days.  She should have a supply of this at home as I prescribed it for postcoital treatment.  If her symptoms have resolved then I would monitor for now and use the Macrobid after intercourse.

## 2018-12-01 NOTE — Telephone Encounter (Signed)
Spoke with patient and advised her. ?

## 2018-12-08 ENCOUNTER — Other Ambulatory Visit: Payer: Self-pay | Admitting: Gastroenterology

## 2018-12-15 ENCOUNTER — Encounter: Payer: Self-pay | Admitting: Gynecology

## 2018-12-17 ENCOUNTER — Other Ambulatory Visit: Payer: Self-pay | Admitting: Internal Medicine

## 2018-12-20 ENCOUNTER — Other Ambulatory Visit: Payer: Self-pay | Admitting: Internal Medicine

## 2018-12-24 ENCOUNTER — Other Ambulatory Visit: Payer: Self-pay | Admitting: Internal Medicine

## 2018-12-25 ENCOUNTER — Encounter: Payer: Self-pay | Admitting: Internal Medicine

## 2018-12-25 MED ORDER — PREGABALIN 150 MG PO CAPS: 150.0000 mg | ORAL_CAPSULE | Freq: Two times a day (BID) | ORAL | 0 refills | Status: DC

## 2018-12-25 NOTE — Telephone Encounter (Signed)
Please advise on refill. She wants a 90 day supply but she is due for a follow up this month. Would you like me to tell her she will get a 30 day supply and to make a follow up visit?  Last OV 06/26/18 Last RF 11/24/18

## 2018-12-25 NOTE — Telephone Encounter (Signed)
[place] Controlled Database Checked Last filled: 11/24/18 # 60 LOV w/you: 10/20/18 Next appt w/you: None

## 2018-12-28 ENCOUNTER — Other Ambulatory Visit: Payer: Self-pay | Admitting: Gastroenterology

## 2019-01-11 NOTE — Progress Notes (Addendum)
Subjective:   Kayla Floyd is a 38 y.o. female who presents for Medicare Annual (Subsequent) preventive examination. I connected with patient by a telephone and verified that I am speaking with the correct person using two identifiers. Patient stated full name and DOB. Patient gave permission to continue with telephonic visit. Patient's location was at home and Nurse's location was at [place] office. Participants during this visit included patient and nurse. Review of Systems:   Cardiac Risk Factors include: advanced age (>53mn, >>75women);dyslipidemia;hypertension Sleep patterns: feels rested on waking and sleeps 10 hours nightly.    Home Safety/Smoke Alarms: Feels safe in home. Smoke alarms in place.  Living environment; residence and Firearm Safety: 1-story house/ trailer. Lives with hudband, no needs for DME, good support system Seat Belt Safety/Bike Helmet: Wears seat belt.     Objective:     Vitals: There were no vitals taken for this visit.  There is no height or weight on file to calculate BMI.  No flowsheet data found.  Tobacco Social History   Tobacco Use  Smoking Status Former Smoker  . Packs/day: 1.00  . Years: 10.00  . Pack years: 10.00  . Quit date: 03/25/2005  . Years since quitting: 13.8  Smokeless Tobacco Never Used     Counseling given: Not Answered  Past Medical History:  Diagnosis Date  . ANXIETY   . BACK PAIN, CHRONIC   . DYSAUTONOMIA 2011   "POTS" , recurrent syncope - follows with cards for same  . GERD   . 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 HYPERLIPIDEMIA   . Kidney failure   . MIGRAINE HEADACHE   . Palpitations   . POLYCYSTIC OVARIAN DISEASE   . POTS (postural orthostatic tachycardia syndrome)   . Tear meniscus knee 06/14/2010   right   Past Surgical History:  Procedure Laterality Date  . Cervical spine  2010   ACDF c5-6  . crevical fusion   2010  . evs    . LUMBAR LAMINECTOMY  2008  .  TONSILLECTOMY AND ADENOIDECTOMY     Family History  Problem Relation Age of Onset  . Bipolar disorder Mother   . Stroke Mother        Age of 149while on OCP  . Other Father        MVA  . Heart disease Father   . Heart attack Father   . Colon cancer Maternal Grandmother [phone] . Cancer Maternal Grandmother   . Hypertension Sister   . Hypertension Sister   . Migraines Sister   . Breast cancer Paternal Grandmother        dx in her 30s-40s  . Cancer Paternal Uncle        ocular  . Breast cancer Paternal Aunt 556 . Lung cancer Paternal Uncle    Social History   Socioeconomic History  . Marital status: Married    Spouse name: Not on file  . Number of children: Not on file  . Years of education: Not on file  . Highest education level: Not on file  Occupational History  . Not on file  Social Needs  . Financial resource strain: Not on file  . Food insecurity    Worry: Not on file    Inability: Not on file  . Transportation needs    Medical: Not on file    Non-medical: Not on file  Tobacco Use  . Smoking status: Former Smoker  Packs/day: 1.00    Years: 10.00    Pack years: 10.00    Quit date: 03/25/2005    Years since quitting: 13.8  . Smokeless tobacco: Never Used  Substance and Sexual Activity  . Alcohol use: Yes    Alcohol/week: 0.0 standard drinks    Comment: occasionally  . Drug use: No  . Sexual activity: Yes    Partners: Male    Birth control/protection: Pill    Comment: 1st intercourse 38 yo-More than 5 partners, married- 1 yr  Lifestyle  . Physical activity    Days per week: Not on file    Minutes per session: Not on file  . Stress: Not on file  Relationships  . Social Herbalist on phone: Not on file    Gets together: Not on file    Attends religious service: Not on file    Active member of club or organization: Not on file    Attends meetings of clubs or organizations: Not on file    Relationship status: Not on file  Other Topics Concern   . Not on file  Social History Narrative   Disability since 10/2012 due to POTS, 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[employer]      Some exercise on a regular exericse    Outpatient Encounter Medications as of 01/12/2019  Medication Sig  . atenolol (TENORMIN) 25 MG tablet TAKE 1 TABLET BY MOUTH DAILY. FOLLOW-UP APPT DUE IN OCT MUST SEE PROVIDER FOR FUTURE REFILLS  . atorvastatin (LIPITOR) 10 MG tablet TAKE 1 TABLET BY MOUTH EVERY DAY  . b complex vitamins tablet Take 1 tablet by mouth daily.  . baclofen (LIORESAL) 10 MG tablet TAKE 1 TABLET BY MOUTH 3 TIMES A DAY AS NEEDED FOR MUSCLE SPASM  . botulinum toxin Type A (BOTOX) 100 UNITS SOLR injection Inject 100 Units into the muscle every 3 (three) months. Every 12 weeks at [facility]  . cetirizine (ZYRTEC) 10 MG tablet Take 10 mg by mouth daily. Reported on 07/13/2015  . diclofenac (VOLTAREN) 75 MG EC tablet TAKE 1 TABLET BY MOUTH TWICE A DAY  . DULoxetine (CYMBALTA) 60 MG capsule TAKE 1 CAPSULE BY MOUTH EVERY DAY  . fenofibrate (TRICOR) 145 MG tablet TAKE 1 TABLET BY MOUTH EVERY DAY  . hyoscyamine (LEVSIN SL) 0.125 MG SL tablet PLACE 1 TABLET (0.125 MG TOTAL) UNDER THE TONGUE EVERY 6 (SIX) HOURS AS NEEDED.  Marland Kitchen ibuprofen (ADVIL,MOTRIN) 200 MG tablet Take by mouth.  . MELATONIN PO Take 6 mg by mouth daily.  . metFORMIN (GLUCOPHAGE) 1000 MG tablet TAKE 1 TABLET BY MOUTH TWICE A DAY WITH MEALS  . mirtazapine (REMERON) 45 MG tablet TAKE 1 TABLET BY MOUTH AT BEDTIME  . Multiple Vitamin (MULTIVITAMIN) tablet Take 1 tablet by mouth daily.    . nitrofurantoin, macrocrystal-monohydrate, (MACROBID) 100 MG capsule TAKE 1 CAPSULE BY MOUTH ONCE FOR 1 DOSE. WITH INTERCOURSE  . norethindrone-ethinyl estradiol (JUNEL 1/20) 1-20 MG-MCG tablet Take 1 tablet by mouth daily.  Marland Kitchen omeprazole (PRILOSEC) 40 MG capsule TAKE 1 CAPSULE BY MOUTH DAILY. FOLLOW-UP APPT DUE IN OCT MUST SEE PROVIDER FOR FUTURE REFILLS  . pregabalin (LYRICA) 150 MG capsule Take 1 capsule (150 mg total) by  mouth 2 (two) times daily.  . promethazine (PHENERGAN) 12.5 MG tablet Take by mouth.  Marland Kitchen tiZANidine (ZANAFLEX) 4 MG tablet TAKE 1 TABLET BY MOUTH EVERY 8 HOURS AS NEEDED FOR MUSCLE SPASMS  . valACYclovir (VALTREX) 1000 MG tablet Take one pill  daily.  Increase to twice daily with outbreaks  . [DISCONTINUED] ciprofloxacin (CIPRO) 250 MG tablet Take 1 tablet (250 mg total) by mouth 2 (two) times daily. For 7 days (Patient not taking: Reported on 01/12/2019)  . [DISCONTINUED] pregabalin (LYRICA) 150 MG capsule TAKE 1 CAPSULE BY MOUTH TWICE A DAY (Patient not taking: Reported on 01/12/2019)   No facility-administered encounter medications on file as of 01/12/2019.     Activities of Daily Living In your present state of health, do you have any difficulty performing the following activities: 01/12/2019  Hearing? N  Vision? N  Difficulty concentrating or making decisions? N  Walking or climbing stairs? N  Dressing or bathing? N  Doing errands, shopping? N  Preparing Food and eating ? N  Using the Toilet? N  In the past six months, have you accidently leaked urine? N  Do you have problems with loss of bowel control? N  Managing your Medications? N  Managing your Finances? N  Housekeeping or managing your Housekeeping? N  Some recent data might be hidden    Patient Care Team: Binnie Rail, MD as PCP - General (Internal Medicine) Minus Breeding, MD (Cardiology) Marchia Bond, MD (Orthopedic Surgery) Everardo Pacific, [place] (Nurse Practitioner)    Assessment:   This is a routine wellness examination for Orine. Physical assessment deferred to PCP.  Exercise Activities and Dietary recommendations Current Exercise Habits: The patient does not participate in regular exercise at present, Intensity: Mild, Exercise limited by: None identified  Diet (meal preparation, eat out, water intake, caffeinated beverages, dairy products, fruits and vegetables): in general, a "healthy" diet  , well  balanced   Reviewed heart healthy and diabetic diet. Encouraged patient to increase daily water and healthy fluid intake.  Goals   None     Fall Risk Fall Risk  01/12/2019 03/29/2015 06/20/2014  Falls in the past year? 0 Yes Yes  Number falls in past yr: 0 2 or more 1  Injury with Fall? 0 Yes No  Risk Factor Category  - High Fall Risk -  Risk for fall due to : - History of fall(s);Impaired balance/gait;Impaired mobility -  Follow up - Falls evaluation completed;Falls prevention discussed Falls prevention discussed     Depression Screen PHQ 2/9 Scores 01/12/2019 06/20/2014  PHQ - 2 Score 2 0     Cognitive Function       Ad8 score reviewed for issues:  Issues making decisions: no  Less interest in hobbies / activities: no  Repeats questions, stories (family complaining): no  Trouble using ordinary gadgets (microwave, computer, phone):no  Forgets the month or year: no  Mismanaging finances: no  Remembering appts: no  Daily problems with thinking and/or memory: no Ad8 score is= 0  Immunization History  Administered Date(s) Administered  . DTP 03/31/1980, 05/02/1980, 06/07/1980, 07/05/1981, 11/09/1984  . Influenza Whole 12/23/2008  . Influenza,inj,Quad PF,6+ Mos 12/23/2017  . MMR 04/25/1981, 08/26/2003  . OPV 03/31/1980, 06/07/1980, 08/09/1980, 07/05/1981, 11/09/1984  . Td 11/11/1994, 03/25/2008  . Tdap 12/23/2017   Screening Tests Health Maintenance  Topic Date Due  . PAP SMEAR-Modifier  05/04/2018  . INFLUENZA VACCINE  10/24/2018  . TETANUS/TDAP  12/24/2027  . HIV Screening  Completed      Plan:     Reviewed health maintenance screenings with patient today and relevant education, vaccines, and/or referrals were provided.   Continue to eat heart healthy diet (full of fruits, vegetables, whole grains, lean protein, water--limit salt, fat, and sugar intake) and increase  physical activity as tolerated.  I have personally reviewed and noted the following  in the patient's chart:   . Medical and social history . Use of alcohol, tobacco or illicit drugs  . Current medications and supplements . Functional ability and status . Nutritional status . Physical activity . Advanced directives . List of other physicians . Screenings to include cognitive, depression, and falls . Referrals and appointments  In addition, I have reviewed and discussed with patient certain preventive protocols, quality metrics, and best practice recommendations. A written personalized care plan for preventive services as well as general preventive health recommendations were provided to patient.     Michiel Cowboy, RN  01/12/2019   Medical screening examination/treatment/procedure(s) were performed by non-physician practitioner and as supervising physician I was immediately available for consultation/collaboration. I agree with above. Binnie Rail, MD

## 2019-01-12 ENCOUNTER — Ambulatory Visit (INDEPENDENT_AMBULATORY_CARE_PROVIDER_SITE_OTHER): Payer: Medicare Other | Admitting: *Deleted

## 2019-01-12 DIAGNOSIS — Z0000 Encounter for general adult medical examination without abnormal findings: Secondary | ICD-10-CM | POA: Diagnosis not present

## 2019-01-20 ENCOUNTER — Other Ambulatory Visit: Payer: Self-pay | Admitting: Internal Medicine

## 2019-02-19 ENCOUNTER — Other Ambulatory Visit: Payer: Self-pay | Admitting: Internal Medicine

## 2019-02-22 ENCOUNTER — Other Ambulatory Visit: Payer: Self-pay

## 2019-02-22 MED ORDER — NITROFURANTOIN MONOHYD MACRO 100 MG PO CAPS: ORAL_CAPSULE | ORAL | 3 refills | Status: DC

## 2019-03-02 DIAGNOSIS — G43709 Chronic migraine without aura, not intractable, without status migrainosus: Secondary | ICD-10-CM | POA: Diagnosis not present

## 2019-03-02 DIAGNOSIS — Q796 Ehlers-Danlos syndrome, unspecified: Secondary | ICD-10-CM | POA: Insufficient documentation

## 2019-03-23 ENCOUNTER — Other Ambulatory Visit: Payer: Self-pay | Admitting: Internal Medicine

## 2019-03-25 ENCOUNTER — Other Ambulatory Visit: Payer: Self-pay | Admitting: Internal Medicine

## 2019-04-17 ENCOUNTER — Other Ambulatory Visit: Payer: Self-pay | Admitting: Internal Medicine

## 2019-04-20 ENCOUNTER — Other Ambulatory Visit: Payer: Self-pay | Admitting: Internal Medicine

## 2019-04-26 ENCOUNTER — Encounter: Payer: Self-pay | Admitting: Internal Medicine

## 2019-04-26 ENCOUNTER — Other Ambulatory Visit: Payer: Self-pay | Admitting: Internal Medicine

## 2019-04-27 MED ORDER — PREGABALIN 50 MG PO CAPS: ORAL_CAPSULE | ORAL | 0 refills | Status: DC

## 2019-05-10 ENCOUNTER — Encounter: Payer: Self-pay | Admitting: Internal Medicine

## 2019-05-12 MED ORDER — OMEPRAZOLE 40 MG PO CPDR: DELAYED_RELEASE_CAPSULE | ORAL | 0 refills | Status: DC

## 2019-05-13 ENCOUNTER — Other Ambulatory Visit: Payer: Self-pay | Admitting: Internal Medicine

## 2019-05-20 ENCOUNTER — Encounter: Payer: Self-pay | Admitting: Internal Medicine

## 2019-05-20 ENCOUNTER — Telehealth: Payer: Self-pay | Admitting: Internal Medicine

## 2019-05-20 MED ORDER — PREGABALIN 50 MG PO CAPS: 50.0000 mg | ORAL_CAPSULE | Freq: Two times a day (BID) | ORAL | 0 refills | Status: DC

## 2019-05-20 NOTE — Telephone Encounter (Signed)
New message:  Pt is calling and requesting to speak with Dr. [name]. She states if she can take care of sending in a prescription for her of pregabalin (LYRICA) 50 MG capsule then there will be no need for the call back. Pt states she is in a lot of pain. Pt is scheduled for an appt on 03/01 to see Dr. [name].

## 2019-05-20 NOTE — Telephone Encounter (Signed)
lyrica 50 mg BID sent to [facility].  Has f/u scheduled.

## 2019-05-23 NOTE — Progress Notes (Signed)
Subjective:    Patient ID: Kayla Floyd, female    DOB: [birth year], 39 y.o.   MRN: [id number]  HPI The patient is here for follow up of their chronic medical problems, including POTS, hypertension, prediabetes, hyperlipidemia, anxiety, depression, chronic back pain   She takes cymbalta, lyrica and tizanidine or baclofen as needed for her pain.  She tried getting off the lyrica and her pain was too high and she did restart it.  She is back to 150 mg twice daily and it is helping.  She was unsure if there was an alternative that would be effective-if not she feels she needs to continue it.  She did go back on all of her other medications since she was on Lyrica and therefore cannot do IVF treatments.  She is thinking about pursuing adoption or using a surrogate.  She feels all of her other medications are working well and she is happy with them.  Her and her husband will be moving to [place] for his job.  She feels they will be out there for 2 years and then reevaluating if they want to come back to the [facility] or stay there.  She will continue to see me intermittently over the next couple of years.    Medications and allergies reviewed with patient and updated if appropriate.  Patient Active Problem List   Diagnosis Date Noted  . Myalgia 10/20/2018  . Diarrhea 10/20/2018  . Dysphagia 06/27/2018  . Elevated serum creatinine 09/29/2017  . Acute cystitis without hematuria 09/23/2017  . Abdominal pain 09/23/2017  . Gastroparesis 10/28/2016  . Prediabetes 08/31/2016  . Genetic testing 08/29/2016  . Depression 08/28/2016  . Family history of colon cancer   . Family history of breast cancer   . Obese 03/15/2015  . Chronic migraine without aura without status migrainosus, not intractable 06/20/2014  . Tear meniscus knee 06/14/2010  . Dysautonomia - POTS 03/20/2010  . Anxiety 07/30/2009  . Essential hypertension 07/28/2009  . SYNCOPE AND COLLAPSE 07/10/2009  .  PALPITATIONS 07/10/2009  . POLYCYSTIC OVARIAN DISEASE 08/31/2008  . Hypercholesterolemia with hypertriglyceridemia 08/31/2008  . GERD 08/31/2008  . Chronic back pain 08/31/2008    Current Outpatient Medications on File Prior to Visit  Medication Sig Dispense Refill  . b complex vitamins tablet Take 1 tablet by mouth daily.    . botulinum toxin Type A (BOTOX) 100 UNITS SOLR injection Inject 100 Units into the muscle every 3 (three) months. Every 12 weeks at [facility] 2 vial   . cetirizine (ZYRTEC) 10 MG tablet Take 10 mg by mouth daily. Reported on 07/13/2015    . hyoscyamine (LEVSIN SL) 0.125 MG SL tablet PLACE 1 TABLET (0.125 MG TOTAL) UNDER THE TONGUE EVERY 6 (SIX) HOURS AS NEEDED. 60 tablet 1  . ibuprofen (ADVIL,MOTRIN) 200 MG tablet Take by mouth.    . MELATONIN PO Take 6 mg by mouth daily.    . Multiple Vitamin (MULTIVITAMIN) tablet Take 1 tablet by mouth daily.      . nitrofurantoin, macrocrystal-monohydrate, (MACROBID) 100 MG capsule TAKE 1 CAPSULE BY MOUTH ONCE FOR 1 DOSE. WITH INTERCOURSE 30 capsule 3  . norethindrone-ethinyl estradiol (JUNEL 1/20) 1-20 MG-MCG tablet Take 1 tablet by mouth daily. 63 tablet 4  . promethazine (PHENERGAN) 12.5 MG tablet Take by mouth.    . valACYclovir (VALTREX) 1000 MG tablet Take one pill daily.  Increase to twice daily with outbreaks 180 tablet 2   No current facility-administered medications  on file prior to visit.    Past Medical History:  Diagnosis Date  . ANXIETY   . BACK PAIN, CHRONIC   . DYSAUTONOMIA 2011   "POTS" , recurrent syncope - follows with cards for same  . GERD   . 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 HYPERLIPIDEMIA   . Kidney failure   . MIGRAINE HEADACHE   . Palpitations   . POLYCYSTIC OVARIAN DISEASE   . POTS (postural orthostatic tachycardia syndrome)   . Tear meniscus knee 06/14/2010   right    Past Surgical History:  Procedure Laterality Date  . Cervical spine  2010    ACDF c5-6  . crevical fusion   2010  . evs    . LUMBAR LAMINECTOMY  2008  . TONSILLECTOMY AND ADENOIDECTOMY      Social History   Socioeconomic History  . Marital status: Married    Spouse name: Not on file  . Number of children: Not on file  . Years of education: Not on file  . Highest education level: Not on file  Occupational History  . Occupation: part-time Location manager  Tobacco Use  . Smoking status: Former Smoker    Packs/day: 1.00    Years: 10.00    Pack years: 10.00    Quit date: 03/25/2005    Years since quitting: 14.1  . Smokeless tobacco: Never Used  Substance and Sexual Activity  . Alcohol use: Yes    Alcohol/week: 0.0 standard drinks    Comment: occasionally  . Drug use: No  . Sexual activity: Yes    Partners: Male    Birth control/protection: Pill    Comment: 1st intercourse 39 yo-More than 5 partners, married- 1 yr  Other Topics Concern  . Not on file  Social History Narrative   Disability since 10/2012 due to POTS, 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[employer]      Some exercise on a regular exericse   Social Determinants of Health   Financial Resource Strain: Low Risk   . Difficulty of Paying Living Expenses: Not hard at all  Food Insecurity: No Food Insecurity  . Worried About Charity fundraiser in the Last Year: Never true  . Ran Out of Food in the Last Year: Never true  Transportation Needs: No Transportation Needs  . Lack of Transportation (Medical): No  . Lack of Transportation (Non-Medical): No  Physical Activity: Inactive  . Days of Exercise per Week: 0 days  . Minutes of Exercise per Session: 0 min  Stress: No Stress Concern Present  . Feeling of Stress : Only a little  Social Connections: Unknown  . Frequency of Communication with Friends and Family: More than three times a week  . Frequency of Social Gatherings with Friends and Family: More than three times a week  . Attends Religious Services: Not on file  . Active Member of Clubs or Organizations:  Not on file  . Attends Archivist Meetings: Not on file  . Marital Status: Married    Family History  Problem Relation Age of Onset  . Bipolar disorder Mother   . Stroke Mother        Age of 99 while on OCP  . Other Father        MVA  . Heart disease Father   . Heart attack Father   . Colon cancer Maternal Grandmother 12  . Cancer Maternal Grandmother   . Hypertension Sister   . Hypertension  Sister   . Migraines Sister   . Breast cancer Paternal Grandmother        dx in her 30s-40s  . Cancer Paternal Uncle        ocular  . Breast cancer Paternal Aunt 85  . Lung cancer Paternal Uncle     Review of Systems  Constitutional: Negative for chills and fever.  Respiratory: Negative for cough, shortness of breath and wheezing.   Cardiovascular: Positive for chest pain and palpitations. Negative for leg swelling.  Musculoskeletal: Positive for back pain.  Neurological: Positive for dizziness, light-headedness and headaches.       Objective:   Vitals:   05/24/19 1528  BP: 114/74  Pulse: 94  Resp: 16  Temp: 98.2 F (36.8 C)  SpO2: 98%   BP Readings from Last 3 Encounters:  05/24/19 114/74  11/27/18 120/78  11/20/18 102/64   Wt Readings from Last 3 Encounters:  05/24/19 196 lb (88.9 kg)  11/27/18 196 lb (88.9 kg)  11/20/18 190 lb (86.2 kg)   Body mass index is 33.64 kg/m.   Physical Exam    Constitutional: Appears well-developed and well-nourished. No distress.  HENT:  Head: Normocephalic and atraumatic.  Neck: Neck supple. No tracheal deviation present. No thyromegaly present.  No cervical lymphadenopathy Cardiovascular: Normal rate, regular rhythm and normal heart sounds.   No murmur heard. No carotid bruit .  No edema Pulmonary/Chest: Effort normal and breath sounds normal. No respiratory distress. No has no wheezes. No rales.  Skin: Skin is warm and dry. Not diaphoretic.  Psychiatric: Normal mood and affect. Behavior is normal.       Assessment & Plan:    See Problem List for Assessment and Plan of chronic medical problems.    This visit occurred during the SARS-CoV-2 public health emergency.  Safety protocols were in place, including screening questions prior to the visit, additional usage of staff PPE, and extensive cleaning of exam room while observing appropriate contact time as indicated for disinfecting solutions.

## 2019-05-23 NOTE — Patient Instructions (Addendum)
  Blood work was ordered.     Medications reviewed and updated.  Changes include :  none   Your prescription(s) have been submitted to your pharmacy. Please take as directed and contact our office if you believe you are having problem(s) with the medication(s).   Follow up in 6 months in you are still in the area.

## 2019-05-24 ENCOUNTER — Encounter: Payer: Self-pay | Admitting: Internal Medicine

## 2019-05-24 ENCOUNTER — Other Ambulatory Visit: Payer: Self-pay

## 2019-05-24 ENCOUNTER — Ambulatory Visit (INDEPENDENT_AMBULATORY_CARE_PROVIDER_SITE_OTHER): Payer: Medicare Other | Admitting: Internal Medicine

## 2019-05-24 VITALS — BP 114/74 | HR 94 | Temp 98.2°F | Resp 16 | Ht 64.0 in | Wt 196.0 lb

## 2019-05-24 DIAGNOSIS — F419 Anxiety disorder, unspecified: Secondary | ICD-10-CM | POA: Diagnosis not present

## 2019-05-24 DIAGNOSIS — I10 Essential (primary) hypertension: Secondary | ICD-10-CM

## 2019-05-24 DIAGNOSIS — E782 Mixed hyperlipidemia: Secondary | ICD-10-CM

## 2019-05-24 DIAGNOSIS — F3289 Other specified depressive episodes: Secondary | ICD-10-CM

## 2019-05-24 DIAGNOSIS — G901 Familial dysautonomia [Riley-Day]: Secondary | ICD-10-CM | POA: Diagnosis not present

## 2019-05-24 DIAGNOSIS — R7303 Prediabetes: Secondary | ICD-10-CM | POA: Diagnosis not present

## 2019-05-24 DIAGNOSIS — G8929 Other chronic pain: Secondary | ICD-10-CM

## 2019-05-24 DIAGNOSIS — M549 Dorsalgia, unspecified: Secondary | ICD-10-CM

## 2019-05-24 LAB — LIPID PANEL
Cholesterol: 235 mg/dL — ABNORMAL HIGH (ref 0–200)
HDL: 30.8 mg/dL — ABNORMAL LOW (ref >39.00)
NonHDL: 203.76
Total CHOL/HDL Ratio: 8
Triglycerides: 274 mg/dL — ABNORMAL HIGH (ref 0.0–149.0)
VLDL: 54.8 mg/dL — ABNORMAL HIGH (ref 0.0–40.0)

## 2019-05-24 LAB — COMPREHENSIVE METABOLIC PANEL
ALT: 34 U/L (ref 0–35)
AST: 28 U/L (ref 0–37)
Albumin: 4.5 g/dL (ref 3.5–5.2)
Alkaline Phosphatase: 39 U/L (ref 39–117)
BUN: 11 mg/dL (ref 6–23)
CO2: 28 mEq/L (ref 19–32)
Calcium: 10.2 mg/dL (ref 8.4–10.5)
Chloride: 104 mEq/L (ref 96–112)
Creatinine, Ser: 0.77 mg/dL (ref 0.40–1.20)
GFR: 83.29 mL/min (ref >60.00)
Glucose, Bld: 89 mg/dL (ref 70–99)
Potassium: 3.7 mEq/L (ref 3.5–5.1)
Sodium: 143 mEq/L (ref 135–145)
Total Bilirubin: 0.4 mg/dL (ref 0.2–1.2)
Total Protein: 7.8 g/dL (ref 6.0–8.3)

## 2019-05-24 LAB — CBC WITH DIFFERENTIAL/PLATELET
Basophils Absolute: 0.1 10*3/uL (ref 0.0–0.1)
Basophils Relative: 0.9 % (ref 0.0–3.0)
Eosinophils Absolute: 0.1 10*3/uL (ref 0.0–0.7)
Eosinophils Relative: 1.5 % (ref 0.0–5.0)
HCT: 32.9 % — ABNORMAL LOW (ref 36.0–46.0)
Hemoglobin: 10.9 g/dL — ABNORMAL LOW (ref 12.0–15.0)
Lymphocytes Relative: 47.8 % — ABNORMAL HIGH (ref 12.0–46.0)
Lymphs Abs: 3.1 10*3/uL (ref 0.7–4.0)
MCHC: 33 g/dL (ref 30.0–36.0)
MCV: 82.2 fl (ref 78.0–100.0)
Monocytes Absolute: 0.3 10*3/uL (ref 0.1–1.0)
Monocytes Relative: 5.3 % (ref 3.0–12.0)
Neutro Abs: 2.8 10*3/uL (ref 1.4–7.7)
Neutrophils Relative %: 44.5 % (ref 43.0–77.0)
Platelets: 349 10*3/uL (ref 150.0–400.0)
RBC: 4 Mil/uL (ref 3.87–5.11)
RDW: 14.8 % (ref 11.5–15.5)
WBC: 6.4 10*3/uL (ref 4.0–10.5)

## 2019-05-24 LAB — HEMOGLOBIN A1C: Hgb A1c MFr Bld: 5.7 % (ref 4.6–6.5)

## 2019-05-24 LAB — LDL CHOLESTEROL, DIRECT: Direct LDL: 167 mg/dL

## 2019-05-24 MED ORDER — DICLOFENAC SODIUM 75 MG PO TBEC: 75.0000 mg | DELAYED_RELEASE_TABLET | Freq: Two times a day (BID) | ORAL | 1 refills | Status: DC

## 2019-05-24 MED ORDER — PREGABALIN 150 MG PO CAPS: 150.0000 mg | ORAL_CAPSULE | Freq: Two times a day (BID) | ORAL | 0 refills | Status: DC

## 2019-05-24 MED ORDER — TIZANIDINE HCL 4 MG PO TABS: ORAL_TABLET | ORAL | 1 refills | Status: DC

## 2019-05-24 MED ORDER — ATENOLOL 25 MG PO TABS: ORAL_TABLET | ORAL | 1 refills | Status: DC

## 2019-05-24 MED ORDER — MIRTAZAPINE 45 MG PO TABS: 45.0000 mg | ORAL_TABLET | Freq: Every day | ORAL | 1 refills | Status: DC

## 2019-05-24 MED ORDER — FENOFIBRATE 145 MG PO TABS: 145.0000 mg | ORAL_TABLET | Freq: Every day | ORAL | 1 refills | Status: DC

## 2019-05-24 MED ORDER — BACLOFEN 10 MG PO TABS: ORAL_TABLET | ORAL | 1 refills | Status: DC

## 2019-05-24 MED ORDER — ATORVASTATIN CALCIUM 10 MG PO TABS: 10.0000 mg | ORAL_TABLET | Freq: Every day | ORAL | 1 refills | Status: DC

## 2019-05-24 MED ORDER — DULOXETINE HCL 60 MG PO CPEP: ORAL_CAPSULE | ORAL | 1 refills | Status: DC

## 2019-05-24 MED ORDER — METFORMIN HCL 1000 MG PO TABS: 1000.0000 mg | ORAL_TABLET | Freq: Two times a day (BID) | ORAL | 1 refills | Status: DC

## 2019-05-24 MED ORDER — OMEPRAZOLE 40 MG PO CPDR: DELAYED_RELEASE_CAPSULE | ORAL | 1 refills | Status: DC

## 2019-05-24 NOTE — Assessment & Plan Note (Signed)
Chronic Controlled, stable Continue current dose of medication-Cymbalta

## 2019-05-24 NOTE — Assessment & Plan Note (Signed)
Chronic Following with cardiology Stable Continue current medications

## 2019-05-24 NOTE — Assessment & Plan Note (Signed)
Chronic BP well controlled Current regimen effective and well tolerated Continue current medications at current doses cmp  

## 2019-05-24 NOTE — Assessment & Plan Note (Signed)
Chronic Does chiropractor visits and massage therapy as needed Uses a TENS machine Pain controlled with Lyrica, Cymbalta, diclofenac and baclofen or tizanidine as needed We will continue current medications

## 2019-05-24 NOTE — Assessment & Plan Note (Signed)
Chronic Controlled, stable Continue current dose of medication-Cymbalta at current dose

## 2019-05-24 NOTE — Assessment & Plan Note (Signed)
Chronic Check lipid panel  Continue daily statin, fibrate Regular exercise and healthy diet encouraged

## 2019-05-24 NOTE — Assessment & Plan Note (Signed)
Chronic Check a1c Low sugar / carb diet Stressed regular exercise  

## 2019-05-25 ENCOUNTER — Encounter: Payer: Self-pay | Admitting: Internal Medicine

## 2019-06-01 DIAGNOSIS — G43709 Chronic migraine without aura, not intractable, without status migrainosus: Secondary | ICD-10-CM | POA: Diagnosis not present

## 2019-06-23 ENCOUNTER — Encounter: Payer: Self-pay | Admitting: Internal Medicine

## 2019-06-25 ENCOUNTER — Other Ambulatory Visit: Payer: Self-pay | Admitting: Internal Medicine

## 2019-06-25 ENCOUNTER — Encounter: Payer: Self-pay | Admitting: Internal Medicine

## 2019-06-28 MED ORDER — PREGABALIN 150 MG PO CAPS: 150.0000 mg | ORAL_CAPSULE | Freq: Two times a day (BID) | ORAL | 0 refills | Status: DC

## 2019-07-22 ENCOUNTER — Telehealth: Payer: Self-pay | Admitting: Internal Medicine

## 2019-07-22 DIAGNOSIS — I10 Essential (primary) hypertension: Secondary | ICD-10-CM

## 2019-07-22 NOTE — Progress Notes (Signed)
°  Chronic Care Management   Note  07/22/2019 Name: Kayla Floyd MRN: [id number] DOB: December 28, 1980  Kayla Floyd is a 39 y.o. year old female who is a primary care patient of Burns, Bobette Mo, MD. I reached out to Kayla Floyd by phone today in response to a referral sent by [name]'s PCP, [name], Bobette Mo, MD.   [name] was given information about Chronic Care Management services today including:  1. CCM service includes personalized support from designated clinical staff supervised by her physician, including individualized plan of care and coordination with other care providers 2. 24/7 contact phone numbers for assistance for urgent and routine care needs. 3. Service will only be billed when office clinical staff spend 20 minutes or more in a month to coordinate care. 4. Only one practitioner may furnish and bill the service in a calendar month. 5. The patient may stop CCM services at any time (effective at the end of the month) by phone call to the office staff.   Patient agreed to services and verbal consent obtained.    This note is not being shared with the patient for the following reason: To respect privacy (The patient or proxy has requested that the information not be shared). Raynicia Dukes [employer] Scheduler  Follow up plan:   SIGNATURE

## 2019-07-27 ENCOUNTER — Other Ambulatory Visit: Payer: Self-pay | Admitting: Internal Medicine

## 2019-07-27 NOTE — Telephone Encounter (Signed)
Last OV 05/24/19, no scheduled future visit, last refill 06/28/19

## 2019-08-20 ENCOUNTER — Other Ambulatory Visit: Payer: Self-pay | Admitting: Internal Medicine

## 2019-08-25 ENCOUNTER — Other Ambulatory Visit: Payer: Self-pay | Admitting: Internal Medicine

## 2019-08-25 MED ORDER — PREGABALIN 150 MG PO CAPS: 150.0000 mg | ORAL_CAPSULE | Freq: Two times a day (BID) | ORAL | 0 refills | Status: DC

## 2019-08-25 NOTE — Telephone Encounter (Signed)
Last RF 07/27/19 Last OV 05/24/19

## 2019-08-25 NOTE — Telephone Encounter (Signed)
New Message:    1.Medication Requested: pregabalin (LYRICA) 150 MG capsule 2. Pharmacy (Name, [address] [place]): [facility] [id number] - [place], [place] - [address] AT [facility] 3. On Med List: Yes  4. Last Visit with PCP: 05/24/19  5. Next visit date with PCP: No  Please fax manually with an actual signature [phone] Agent: Please be advised that RX refills may take up to 3 business days. We ask that you follow-up with your pharmacy.

## 2019-08-27 ENCOUNTER — Encounter: Payer: Self-pay | Admitting: Internal Medicine

## 2019-08-30 ENCOUNTER — Ambulatory Visit: Payer: Medicare Other | Admitting: Pharmacist

## 2019-08-30 ENCOUNTER — Other Ambulatory Visit: Payer: Self-pay

## 2019-08-30 DIAGNOSIS — E782 Mixed hyperlipidemia: Secondary | ICD-10-CM

## 2019-08-30 DIAGNOSIS — I10 Essential (primary) hypertension: Secondary | ICD-10-CM

## 2019-08-30 DIAGNOSIS — R7303 Prediabetes: Secondary | ICD-10-CM

## 2019-08-30 NOTE — Patient Instructions (Addendum)
Visit Information  Phone number for Pharmacist: [phone]  Thank you for meeting with me to discuss your medications! I look forward to working with you to achieve your health care goals. Below is a summary of what we talked about during the visit:  Goals Addressed            This Visit's Progress   . Pharmacy Care Plan       CARE PLAN ENTRY  Current Barriers:  . Chronic Disease Management support, education, and care coordination needs related to Hypertension, Hyperlipidemia, Depression, and Prediabetes   Hypertension/Dysautonomia BP Readings from Last 3 Encounters:  05/24/19 114/74  11/27/18 120/78  11/20/18 102/64 .  Pharmacist Clinical Goal(s): o Over the next 180 days, patient will work with PharmD and providers to maintain BP goal <130/80 . Current regimen:  o Atenolol 25 mg daily (also helps migraines) . Interventions: o Discussed benefits of atenolol for both dysautonomia and migraines . Patient self care activities - Over the next 180 days, patient will: o Check BP as needed, document, and provide at future appointments o Ensure daily salt intake < 2300 mg/day  Hyperlipidemia Lab Results  Component Value Date/Time   LDLCALC 92 04/07/2013 12:00 AM   LDLDIRECT 167.0 05/24/2019 04:21 PM   TRIG 274.0 (H) 05/24/2019 04:21 PM   TRIG 378 06/06/2008 12:00 AM .  Pharmacist Clinical Goal(s): o Over the next 180 days, patient will work with PharmD and providers to achieve LDL goal < 100 and Triglycerides < 150 . Current regimen:  o Atorvastatin 10 mg daily o Fenofibrate 145 mg daily . Interventions: o Discussed benefits of statin and fibrate for LDL and triglyceride control as well as prevention of CV disease . Patient self care activities - Over the next 180 days, patient will: o Continue medications as prescribed o Continue control with diet and exercise  Prediabetes Lab Results  Component Value Date/Time   HGBA1C 5.7 05/24/2019 04:21 PM   HGBA1C 5.7  12/23/2017 04:05 PM .  Pharmacist Clinical Goal(s): o Over the next 180 days, patient will work with PharmD and providers to maintain A1c goal <6.5% . Current regimen:  o Metformin 1000 mg twice a day (also for PCOS) . Interventions: o Benefits of metformin for prediabetes and PCOS . Patient self care activities - Over the next 180 days, patient will: o Continue medications as prescribed  Medication management . Pharmacist Clinical Goal(s): o Over the next 180 days, patient will work with PharmD and providers to maintain optimal medication adherence . Current pharmacy: [facility] . Interventions o Comprehensive medication review performed. o Continue current medication management strategy . Patient self care activities - Over the next 180 days, patient will: o Focus on medication adherence by pill pack o Take medications as prescribed o Report any questions or concerns to PharmD and/or provider(s)  Initial goal documentation       Ms. Heatherly was given information about Chronic Care Management services today including:  1. CCM service includes personalized support from designated clinical staff supervised by her physician, including individualized plan of care and coordination with other care providers 2. 24/7 contact phone numbers for assistance for urgent and routine care needs. 3. Standard insurance, coinsurance, copays and deductibles apply for chronic care management only during months in which we provide at least 20 minutes of these services. Most insurances cover these services at 100%, however patients may be responsible for any copay, coinsurance and/or deductible if applicable. This service may help you avoid  the need for more expensive face-to-face services. 4. Only one practitioner may furnish and bill the service in a calendar month. 5. The patient may stop CCM services at any time (effective at the end of the month) by phone call to the office staff.  Patient  agreed to services and verbal consent obtained.   The patient verbalized understanding of instructions provided today and declined a print copy of patient instruction materials.  Telephone follow up appointment with pharmacy team member scheduled for: as needed  Al Corpus, PharmD Clinical Pharmacist [facility] [phone]    Health Maintenance, Female Adopting a healthy lifestyle and getting preventive care are important in promoting health and wellness. Ask your health care provider about:  The right schedule for you to have regular tests and exams.  Things you can do on your own to prevent diseases and keep yourself healthy. What should I know about diet, weight, and exercise? Eat a healthy diet   Eat a diet that includes plenty of vegetables, fruits, low-fat dairy products, and lean protein.  Do not eat a lot of foods that are high in solid fats, added sugars, or sodium. Maintain a healthy weight Body mass index (BMI) is used to identify weight problems. It estimates body fat based on height and weight. Your health care provider can help determine your BMI and help you achieve or maintain a healthy weight. Get regular exercise Get regular exercise. This is one of the most important things you can do for your health. Most adults should:  Exercise for at least 150 minutes each week. The exercise should increase your heart rate and make you sweat (moderate-intensity exercise).  Do strengthening exercises at least twice a week. This is in addition to the moderate-intensity exercise.  Spend less time sitting. Even light physical activity can be beneficial. Watch cholesterol and blood lipids Have your blood tested for lipids and cholesterol at 40 years of age, then have this test every 5 years. Have your cholesterol levels checked more often if:  Your lipid or cholesterol levels are high.  You are older than 40 years of age.  You are at high  risk for heart disease. What should I know about cancer screening? Depending on your health history and family history, you may need to have cancer screening at various ages. This may include screening for:  Breast cancer.  Cervical cancer.  Colorectal cancer.  Skin cancer.  Lung cancer. What should I know about heart disease, diabetes, and high blood pressure? Blood pressure and heart disease  High blood pressure causes heart disease and increases the risk of stroke. This is more likely to develop in people who have high blood pressure readings, are of African descent, or are overweight.  Have your blood pressure checked: ? Every 3-5 years if you are 58-27 years of age. ? Every year if you are 52 years old or older. Diabetes Have regular diabetes screenings. This checks your fasting blood sugar level. Have the screening done:  Once every three years after age 82 if you are at a normal weight and have a low risk for diabetes.  More often and at a younger age if you are overweight or have a high risk for diabetes. What should I know about preventing infection? Hepatitis B If you have a higher risk for hepatitis B, you should be screened for this virus. Talk with your health care provider to find out if you are at risk for hepatitis B  infection. Hepatitis C Testing is recommended for:  Everyone born from [birth year] through 1966.  Anyone with known risk factors for hepatitis C. Sexually transmitted infections (STIs)  Get screened for STIs, including gonorrhea and chlamydia, if: ? You are sexually active and are younger than 40 years of age. ? You are older than 40 years of age and your health care provider tells you that you are at risk for this type of infection. ? Your sexual activity has changed since you were last screened, and you are at increased risk for chlamydia or gonorrhea. Ask your health care provider if you are at risk.  Ask your health care provider about whether you  are at high risk for HIV. Your health care provider may recommend a prescription medicine to help prevent HIV infection. If you choose to take medicine to prevent HIV, you should first get tested for HIV. You should then be tested every 3 months for as long as you are taking the medicine. Pregnancy  If you are about to stop having your period (premenopausal) and you may become pregnant, seek counseling before you get pregnant.  Take 400 to 800 micrograms (mcg) of folic acid every day if you become pregnant.  Ask for birth control (contraception) if you want to prevent pregnancy. Osteoporosis and menopause Osteoporosis is a disease in which the bones lose minerals and strength with aging. This can result in bone fractures. If you are 55 years old or older, or if you are at risk for osteoporosis and fractures, ask your health care provider if you should:  Be screened for bone loss.  Take a calcium or vitamin D supplement to lower your risk of fractures.  Be given hormone replacement therapy (HRT) to treat symptoms of menopause. Follow these instructions at home: Lifestyle  Do not use any products that contain nicotine or tobacco, such as cigarettes, e-cigarettes, and chewing tobacco. If you need help quitting, ask your health care provider.  Do not use street drugs.  Do not share needles.  Ask your health care provider for help if you need support or information about quitting drugs. Alcohol use  Do not drink alcohol if: ? Your health care provider tells you not to drink. ? You are pregnant, may be pregnant, or are planning to become pregnant.  If you drink alcohol: ? Limit how much you use to 0-1 drink a day. ? Limit intake if you are breastfeeding.  Be aware of how much alcohol is in your drink. In the [place] one drink equals one 12 oz bottle of beer (355 mL), one 5 oz glass of wine (148 mL), or one 1 oz glass of hard liquor (44 mL). General instructions  Schedule regular  health, dental, and eye exams.  Stay current with your vaccines.  Tell your health care provider if: ? You often feel depressed. ? You have ever been abused or do not feel safe at home. Summary  Adopting a healthy lifestyle and getting preventive care are important in promoting health and wellness.  Follow your health care provider's instructions about healthy diet, exercising, and getting tested or screened for diseases.  Follow your health care provider's instructions on monitoring your cholesterol and blood pressure. This information is not intended to replace advice given to you by your health care provider. Make sure you discuss any questions you have with your health care provider. Document Revised: 03/04/2018 Document Reviewed: 03/04/2018 Elsevier Patient Education  2020 [employer].

## 2019-08-30 NOTE — Chronic Care Management (AMB) (Signed)
Chronic Care Management Pharmacy  Name: Kayla Floyd  MRN: [id number] DOB: 1980-04-25   Chief Complaint/ HPI  Kayla Floyd,  39 y.o. , female presents for their Initial CCM visit with the clinical pharmacist via telephone due to COVID-19 Pandemic.  PCP : Pincus Sanes, MD  Their chronic conditions include: Hypertension, Hyperlipidemia, GERD, Depression, Anxiety and Prediabetes, PCOS, dysautonomia, chronic back pain, migraine  Married, nurse. Uses pain roller with essential oils  Office Visits: 05/24/19 Dr [name] OV: tried weaning off lyrica but pain was too much, so restarted, now cannot do IVF treatments. Cholesterol higher likely from stopping statin for IVF. A1c 5.7 prediabetic range.  Consult Visit: 06/01/19 [facility] procedure: Botox injection  Allergies  Allergen Reactions  . Beef-Derived Products     anaphylaxes   . Glycerol, Iodinated Anaphylaxis    anaphylaxes   . Pork-Derived Products     anaplaxis    Medications: Outpatient Encounter Medications as of 08/30/2019  Medication Sig  . atenolol (TENORMIN) 25 MG tablet TAKE 1 TABLET BY MOUTH DAILY.  Marland Kitchen atorvastatin (LIPITOR) 10 MG tablet Take 1 tablet (10 mg total) by mouth daily.  Marland Kitchen b complex vitamins tablet Take 1 tablet by mouth daily.  . baclofen (LIORESAL) 10 MG tablet TAKE 1 TABLET BY MOUTH 3 TIMES A DAY AS NEEDED FOR MUSCLE SPASM  . botulinum toxin Type A (BOTOX) 100 UNITS SOLR injection Inject 100 Units into the muscle every 3 (three) months. Every 12 weeks at [facility]  . cetirizine (ZYRTEC) 10 MG tablet Take 10 mg by mouth daily. Reported on 07/13/2015  . diclofenac (VOLTAREN) 75 MG EC tablet Take 1 tablet (75 mg total) by mouth 2 (two) times daily.  . DULoxetine (CYMBALTA) 60 MG capsule TAKE 1 CAPSULE BY MOUTH EVERY DAY.  . fenofibrate (TRICOR) 145 MG tablet Take 1 tablet (145 mg total) by mouth daily.  . hyoscyamine (LEVSIN SL) 0.125 MG SL tablet PLACE 1 TABLET (0.125 MG TOTAL) UNDER THE TONGUE  EVERY 6 (SIX) HOURS AS NEEDED.  Marland Kitchen ibuprofen (ADVIL,MOTRIN) 200 MG tablet Take by mouth.  . MELATONIN PO Take 6 mg by mouth daily.  . metFORMIN (GLUCOPHAGE) 1000 MG tablet Take 1 tablet (1,000 mg total) by mouth 2 (two) times daily with a meal.  . mirtazapine (REMERON) 45 MG tablet Take 1 tablet (45 mg total) by mouth at bedtime.  . Multiple Vitamin (MULTIVITAMIN) tablet Take 1 tablet by mouth daily.    . nitrofurantoin, macrocrystal-monohydrate, (MACROBID) 100 MG capsule TAKE 1 CAPSULE BY MOUTH ONCE FOR 1 DOSE. WITH INTERCOURSE  . norethindrone-ethinyl estradiol (JUNEL 1/20) 1-20 MG-MCG tablet Take 1 tablet by mouth daily.  Marland Kitchen omeprazole (PRILOSEC) 40 MG capsule TAKE 1 CAPSULE BY MOUTH DAILY.  Marland Kitchen pregabalin (LYRICA) 150 MG capsule Take 1 capsule (150 mg total) by mouth 2 (two) times daily.  . Probiotic Product (PROBIOTIC-10 PO) Take by mouth.  . promethazine (PHENERGAN) 12.5 MG tablet Take by mouth.  Marland Kitchen tiZANidine (ZANAFLEX) 4 MG tablet TAKE 1 TABLET BY MOUTH EVERY 8 HOURS AS NEEDED FOR MUSCLE SPASM  . valACYclovir (VALTREX) 1000 MG tablet Take one pill daily.  Increase to twice daily with outbreaks   No facility-administered encounter medications on file as of 08/30/2019.     Current Diagnosis/Assessment:  SDOH Interventions     Most Recent Value  SDOH Interventions  Financial Strain Interventions  Intervention Not Indicated [Medicare Extra Help for Rx's]      Goals Addressed  This Visit's Progress   . Pharmacy Care Plan       CARE PLAN ENTRY  Current Barriers:  . Chronic Disease Management support, education, and care coordination needs related to Hypertension, Hyperlipidemia, Depression, and Prediabetes   Hypertension/Dysautonomia BP Readings from Last 3 Encounters:  05/24/19 114/74  11/27/18 120/78  11/20/18 102/64 .  Pharmacist Clinical Goal(s): o Over the next 180 days, patient will work with PharmD and providers to maintain BP goal <130/80 . Current regimen:   o Atenolol 25 mg daily (also helps migraines) . Interventions: o Discussed benefits of atenolol for both dysautonomia and migraines . Patient self care activities - Over the next 180 days, patient will: o Check BP as needed, document, and provide at future appointments o Ensure daily salt intake < 2300 mg/day  Hyperlipidemia Lab Results  Component Value Date/Time   LDLCALC 92 04/07/2013 12:00 AM   LDLDIRECT 167.0 05/24/2019 04:21 PM   TRIG 274.0 (H) 05/24/2019 04:21 PM   TRIG 378 06/06/2008 12:00 AM .  Pharmacist Clinical Goal(s): o Over the next 180 days, patient will work with PharmD and providers to achieve LDL goal < 100 and Triglycerides < 150 . Current regimen:  o Atorvastatin 10 mg daily o Fenofibrate 145 mg daily . Interventions: o Discussed benefits of statin and fibrate for LDL and triglyceride control as well as prevention of CV disease . Patient self care activities - Over the next 180 days, patient will: o Continue medications as prescribed o Continue control with diet and exercise  Prediabetes Lab Results  Component Value Date/Time   HGBA1C 5.7 05/24/2019 04:21 PM   HGBA1C 5.7 12/23/2017 04:05 PM .  Pharmacist Clinical Goal(s): o Over the next 180 days, patient will work with PharmD and providers to maintain A1c goal <6.5% . Current regimen:  o Metformin 1000 mg twice a day (also for PCOS) . Interventions: o Benefits of metformin for prediabetes and PCOS . Patient self care activities - Over the next 180 days, patient will: o Continue medications as prescribed  Medication management . Pharmacist Clinical Goal(s): o Over the next 180 days, patient will work with PharmD and providers to maintain optimal medication adherence . Current pharmacy: [facility] . Interventions o Comprehensive medication review performed. o Continue current medication management strategy . Patient self care activities - Over the next 180 days, patient will: o Focus on  medication adherence by pill pack o Take medications as prescribed o Report any questions or concerns to PharmD and/or provider(s)  Initial goal documentation       Hypertension/Dysautonomia   BP goal is:  <130/80  Office blood pressures are  BP Readings from Last 3 Encounters:  05/24/19 114/74  11/27/18 120/78  11/20/18 102/64   Patient checks BP at home when feeling symptomatic Patient home BP readings are ranging: SBP 110-120  Patient has failed these meds in the past: n/a Patient is currently controlled on the following medications:  . Atenolol 25 mg daily  We discussed diet and exercise extensively; dual benefits of atenolol for dysautonomia and migraine; atenolol has been weaned down to current dose and pt denies issues  Plan  Continue current medications     Hyperlipidemia   Lipid Panel     Component Value Date/Time   CHOL 235 (H) 05/24/2019 1621   TRIG 274.0 (H) 05/24/2019 1621   TRIG 378 06/06/2008 0000   HDL 30.80 (L) 05/24/2019 1621   LDLCALC 92 04/07/2013 0000   LDLDIRECT 167.0 05/24/2019 1621  The ASCVD Risk score Denman George DC Jr., et al., 2013) failed to calculate for the following reasons:   The 2013 ASCVD risk score is only valid for ages 26 to 40   Patient has failed these meds in past: n/a Patient is currently uncontrolled on the following medications:  . Atorvastatin 10 mg daily AM . Fenofibrate 145 mg daily  We discussed:  diet and exercise extensively; prior to most recent lipid panel pt had stopped statin and fibrate due to fertility treatments. She has since restarted; pt endorses strong family history of dyslipidemia, she has had cholesterol issues since 65.  Plan  Continue current medications and control with diet and exercise  Prediabetes   Recent Relevant Labs: Lab Results  Component Value Date/Time   HGBA1C 5.7 05/24/2019 04:21 PM   HGBA1C 5.7 12/23/2017 04:05 PM   GFR 83.29 05/24/2019 04:21 PM   GFR 71.72 12/23/2017 04:05  PM    Patient has failed these meds in past: n/a Patient is currently controlled on the following medications: Marland Kitchen Metformin 1000 mg BID (PCOS)  We discussed: diet and exercise extensively; benefits of metformin in prediabetes; risk of metabolic syndrome  Plan  Continue current medications  PCOS   Patient has failed these meds in past: n/a Patient is currently controlled on the following medications:  Marland Kitchen Metformin 1000 mg BID . Junel (norethindrone-ethinyl estradiol 1/20) 1 daily  We discussed:  Patient is satisfied with current regimen and denies issues  Plan  Continue current medications  Migraine   Patient has failed these meds in past: topiramate Patient is currently controlled on the following medications:   Botox 100 unit q3 months   Atenolol 25 mg daily  Promethazine 12.5 mg PRN  We discussed:  Pt reports Botox has helped her significantly - migraines down to 2-3 per month from near daily. She rarely uses promethazine, prefers hyoscyamine for GI issues.  Plan  Continue current medications  Chronic back pain   Patient has failed these meds in past: n/a Patient is currently controlled on the following medications:  . Baclofen 10 mg TID prn - typically BID . Pregabalin 150 mg BID . Tizanidine 4 mg q8h PRN - breakthrough, typically at night . Diclofenac 75 mg BID . Duloxetine 60 mg daily . CBD oil  We discussed: Pt has attempted to wean pain meds but cannot get to less than current regimen; she noticed benefit of diclofenac after skipping it for a few days  Plan  Continue current medications   Depression/Anxiety   Depression screen [facility] 2/9 01/12/2019 06/20/2014  Decreased Interest 0 0  Down, Depressed, Hopeless 1 0  PHQ - 2 Score 1 0  Tired, decreased energy 2 -  Change in appetite 0 -  Feeling bad or failure about yourself  0 -  Trouble concentrating 0 -  Moving slowly or fidgety/restless 0 -  Suicidal thoughts 0 -  Difficult doing work/chores  Not difficult at all -   Patient has failed these meds in past: n/a Patient is currently controlled on the following medications:  . Duloxetine 60 mg daily . Mirtazapine 45 mg HS . Alprazolam - sparingly  We discussed:  Pt reports doing well with current doses  Plan  Continue current medications  GERD   Patient has failed these meds in past: n/a Patient is currently controlled on the following medications:  . Omeprazole 40 mg daily . Hyoscyamine 0.125 mg q6h PRN  We discussed: Pt reports she is unable to skip a dose  of PPI. Pt prefers hyoscyamine over promethazine for GI sx; it works faster and does not cause drowsiness  Plan  Continue current medications  HSV   Patient has failed these meds in past: n/a Patient is currently controlled on the following medications:  . Valacyclovir 1 gram once daily   We discussed:  Pt takes BID during flares, otherwise controlled with once daily dosing  Plan  Continue current medications  Health Maintenance   Patient is currently controlled on the following medications:   Melatonin   Vitamin B complex  Multivitamin  Cetirizine 10 mg daily   Probiotic   [employer] supplements   We discussed: Pt takes several supplements to help with chronic pain, including pain rollers with essential oils. She reports supplements have helped her greatly and she has been able to take fewer muscle relaxers  Plan  Continue current medications  Medication Management   [facility] Uses pill box? Yes Pt endorses 100% compliance  We discussed: Pt has recently started [facility], it is covered by her insurance at no extra cost.  Plan  Continue current medication management strategy    Follow up: as needed  Al Corpus, PharmD Clinical Pharmacist [facility] [phone]

## 2019-09-03 NOTE — Addendum Note (Signed)
Addended by: Radford Pax M on: 09/03/2019 11:51 AM   Modules accepted: Orders

## 2019-09-09 DIAGNOSIS — G43709 Chronic migraine without aura, not intractable, without status migrainosus: Secondary | ICD-10-CM | POA: Diagnosis not present

## 2019-09-12 ENCOUNTER — Other Ambulatory Visit: Payer: Self-pay | Admitting: Internal Medicine

## 2019-09-13 ENCOUNTER — Other Ambulatory Visit: Payer: Self-pay

## 2019-09-13 ENCOUNTER — Telehealth: Payer: Self-pay | Admitting: Internal Medicine

## 2019-09-13 MED ORDER — PREGABALIN 150 MG PO CAPS: 150.0000 mg | ORAL_CAPSULE | Freq: Two times a day (BID) | ORAL | 1 refills | Status: DC

## 2019-09-13 NOTE — Telephone Encounter (Signed)
Patient's script has been sent in.

## 2019-09-13 NOTE — Telephone Encounter (Signed)
  [facility] requesting new prescription

## 2019-09-13 NOTE — Telephone Encounter (Signed)
New message:   [facility] and states that the quantity on this medication is wrong. pregabalin (LYRICA) 150 MG capsule she states it says 46 and it should be 60 tablets. Please advise.

## 2019-09-16 ENCOUNTER — Other Ambulatory Visit: Payer: Self-pay | Admitting: Gastroenterology

## 2019-09-23 ENCOUNTER — Encounter: Payer: Self-pay | Admitting: Internal Medicine

## 2019-09-23 MED ORDER — NITROFURANTOIN MONOHYD MACRO 100 MG PO CAPS: ORAL_CAPSULE | ORAL | 3 refills | Status: DC

## 2019-09-24 ENCOUNTER — Other Ambulatory Visit: Payer: Self-pay | Admitting: Gastroenterology

## 2019-09-29 DIAGNOSIS — N946 Dysmenorrhea, unspecified: Secondary | ICD-10-CM | POA: Diagnosis not present

## 2019-10-05 ENCOUNTER — Telehealth: Payer: Self-pay | Admitting: Internal Medicine

## 2019-10-05 MED ORDER — PREGABALIN 150 MG PO CAPS: 150.0000 mg | ORAL_CAPSULE | Freq: Two times a day (BID) | ORAL | 1 refills | Status: DC

## 2019-10-05 NOTE — Telephone Encounter (Signed)
New message:    1.Medication Requested: pregabalin (LYRICA) 150 MG capsule 2. Pharmacy (Name, [address] [place]): [facility] [phone] - [place], [place] - [address] AT [facility] 3. On Med List: yes  4. Last Visit with PCP: 05/24/19  5. Next visit date with PCP: None  [facility] states that this medication was sent to the local pharmacy for the pt but it has to come to them first and then they will mail it to the pt. Please advise.   Agent: Please be advised that RX refills may take up to 3 business days. We ask that you follow-up with your pharmacy.

## 2019-10-16 ENCOUNTER — Other Ambulatory Visit: Payer: Self-pay | Admitting: Internal Medicine

## 2019-10-27 ENCOUNTER — Other Ambulatory Visit: Payer: Self-pay | Admitting: *Deleted

## 2019-10-27 DIAGNOSIS — A609 Anogenital herpesviral infection, unspecified: Secondary | ICD-10-CM

## 2019-10-27 MED ORDER — VALACYCLOVIR HCL 1 G PO TABS: ORAL_TABLET | ORAL | 0 refills | Status: DC

## 2019-11-12 ENCOUNTER — Other Ambulatory Visit: Payer: Self-pay | Admitting: Gastroenterology

## 2019-11-12 ENCOUNTER — Other Ambulatory Visit: Payer: Self-pay | Admitting: Internal Medicine

## 2019-11-19 ENCOUNTER — Other Ambulatory Visit: Payer: Self-pay | Admitting: Gastroenterology

## 2019-11-30 DIAGNOSIS — Z139 Encounter for screening, unspecified: Secondary | ICD-10-CM | POA: Diagnosis not present

## 2019-11-30 DIAGNOSIS — Z0183 Encounter for blood typing: Secondary | ICD-10-CM | POA: Diagnosis not present

## 2019-12-02 DIAGNOSIS — G43709 Chronic migraine without aura, not intractable, without status migrainosus: Secondary | ICD-10-CM | POA: Diagnosis not present

## 2019-12-14 ENCOUNTER — Other Ambulatory Visit: Payer: Self-pay | Admitting: Internal Medicine

## 2020-01-12 ENCOUNTER — Other Ambulatory Visit: Payer: Self-pay | Admitting: Internal Medicine

## 2020-01-12 ENCOUNTER — Other Ambulatory Visit: Payer: Self-pay | Admitting: Obstetrics and Gynecology

## 2020-01-12 ENCOUNTER — Other Ambulatory Visit: Payer: Self-pay | Admitting: Gastroenterology

## 2020-01-12 DIAGNOSIS — A609 Anogenital herpesviral infection, unspecified: Secondary | ICD-10-CM

## 2020-01-12 NOTE — Telephone Encounter (Signed)
I rec'd a refill request on this patient I saw once in August 2020.  Is she asking to refill this med and does she plan to follow-up with [place]?  - HD

## 2020-01-12 NOTE — Telephone Encounter (Signed)
I sent Kayla Floyd a message and asked her to call and arrange annual exam appt since patient is overdue.

## 2020-01-12 NOTE — Telephone Encounter (Signed)
Patient is scheduled 02/15/20 for CE with TW.

## 2020-01-13 NOTE — Telephone Encounter (Signed)
Patient indicated she has been doing great and the Rx Levsin is working great, she doesn't feel like she needs a follow up office visit and will have Dr [name] take over medication management. I did give 1 refill to the patient so she can discuss with her PCP.

## 2020-02-03 ENCOUNTER — Other Ambulatory Visit: Payer: Self-pay | Admitting: Internal Medicine

## 2020-02-08 ENCOUNTER — Other Ambulatory Visit: Payer: Self-pay | Admitting: Obstetrics and Gynecology

## 2020-02-08 ENCOUNTER — Other Ambulatory Visit: Payer: Self-pay | Admitting: Gastroenterology

## 2020-02-08 ENCOUNTER — Other Ambulatory Visit: Payer: Self-pay | Admitting: Internal Medicine

## 2020-02-08 DIAGNOSIS — A609 Anogenital herpesviral infection, unspecified: Secondary | ICD-10-CM

## 2020-02-08 NOTE — Telephone Encounter (Signed)
This medicine can be refilled for 2 months,  but patient needs an office visit with me since was seen once in Aug 2020.

## 2020-02-15 ENCOUNTER — Encounter: Payer: Medicare Other | Admitting: Nurse Practitioner

## 2020-02-15 DIAGNOSIS — Z0289 Encounter for other administrative examinations: Secondary | ICD-10-CM

## 2020-03-08 ENCOUNTER — Other Ambulatory Visit: Payer: Self-pay | Admitting: Internal Medicine

## 2020-03-08 ENCOUNTER — Other Ambulatory Visit: Payer: Self-pay | Admitting: Gastroenterology

## 2020-03-14 DIAGNOSIS — G43719 Chronic migraine without aura, intractable, without status migrainosus: Secondary | ICD-10-CM | POA: Diagnosis not present

## 2020-04-08 ENCOUNTER — Other Ambulatory Visit: Payer: Self-pay | Admitting: Internal Medicine

## 2020-04-08 ENCOUNTER — Other Ambulatory Visit: Payer: Self-pay | Admitting: Gastroenterology

## 2020-04-10 ENCOUNTER — Other Ambulatory Visit: Payer: Self-pay | Admitting: Gastroenterology

## 2020-04-10 MED ORDER — HYOSCYAMINE SULFATE 0.125 MG SL SUBL: 0.1250 mg | SUBLINGUAL_TABLET | Freq: Four times a day (QID) | SUBLINGUAL | 1 refills | Status: DC | PRN

## 2020-04-10 NOTE — Telephone Encounter (Signed)
This patient was seen once in August 2020.  I refilled the medicine for 2 months, and she needs an appointment to see me in clinic within next 2 months.  - HD

## 2020-04-11 NOTE — Telephone Encounter (Addendum)
Patient declined follow up at this time, she will reach out to her PCP for any additional refills

## 2020-05-09 ENCOUNTER — Other Ambulatory Visit: Payer: Self-pay | Admitting: Obstetrics and Gynecology

## 2020-05-09 ENCOUNTER — Telehealth: Payer: Self-pay | Admitting: Pharmacist

## 2020-05-09 ENCOUNTER — Other Ambulatory Visit: Payer: Self-pay | Admitting: Gastroenterology

## 2020-05-09 ENCOUNTER — Other Ambulatory Visit: Payer: Self-pay | Admitting: Internal Medicine

## 2020-05-09 DIAGNOSIS — A609 Anogenital herpesviral infection, unspecified: Secondary | ICD-10-CM

## 2020-05-09 NOTE — Chronic Care Management (AMB) (Addendum)
Chronic Care Management Pharmacy Assistant   Name: Kayla Floyd  MRN: [id number] DOB: [birth year]  Reason for Encounter: Disease State/ General Adherence Call  PCP : Pincus Sanes, MD  Allergies:   Allergies  Allergen Reactions   Beef-Derived Products     anaphylaxes    Glycerol, Iodinated Anaphylaxis    anaphylaxes    Pork-Derived Products     anaplaxis    Medications: Outpatient Encounter Medications as of 05/09/2020  Medication Sig   atenolol (TENORMIN) 25 MG tablet TAKE 1 TABLET BY MOUTH EVERY DAY. PLEASE SCHEDULE FOLLOW UP FOR FUTURE REFILLS   atorvastatin (LIPITOR) 10 MG tablet TAKE 1 TABLET BY MOUTH EVERY DAY   b complex vitamins tablet Take 1 tablet by mouth daily.   baclofen (LIORESAL) 10 MG tablet TAKE 1 TABLET BY MOUTH 3 TIMES A DAY AS NEEDED FOR MUSCLE SPASMS   botulinum toxin Type A (BOTOX) 100 UNITS SOLR injection Inject 100 Units into the muscle every 3 (three) months. Every 12 weeks at [facility]   cetirizine (ZYRTEC) 10 MG tablet Take 10 mg by mouth daily. Reported on 07/13/2015   diclofenac (VOLTAREN) 75 MG EC tablet Take 1 tablet (75 mg total) by mouth 2 (two) times daily. Will need a follow up appointment for additional refills   DULoxetine (CYMBALTA) 60 MG capsule TAKE 1 CAPSULE BY MOUTH EVERY DAY. Will need a follow up appointment for additional refills   fenofibrate (TRICOR) 145 MG tablet TAKE 1 TABLET (145MG  TOTAL) BY MOUTH EVERY DAY. PLEASE SCHEDULE FOLLOW UP FOR FUTURE REFILLS   hyoscyamine (LEVSIN SL) 0.125 MG SL tablet PLACE 1 TABLET (0.125 MG TOTAL) UNDER THE TONGUE EVERY 6 (SIX) HOURS AS NEEDED.   hyoscyamine (LEVSIN SL) 0.125 MG SL tablet Place 1 tablet (0.125 mg total) under the tongue every 6 (six) hours as needed.   ibuprofen (ADVIL,MOTRIN) 200 MG tablet Take by mouth.   MELATONIN PO Take 6 mg by mouth daily.   metFORMIN (GLUCOPHAGE) 1000 MG tablet TAKE 1 TABLET BY MOUTH TWICE A DAY WITH MEALS   mirtazapine (REMERON) 45 MG tablet  TAKE 1 TABLET (45 MG TOTAL) BY MOUTH AT BEDTIME.   Multiple Vitamin (MULTIVITAMIN) tablet Take 1 tablet by mouth daily.     nitrofurantoin, macrocrystal-monohydrate, (MACROBID) 100 MG capsule TAKE 1 CAPSULE BY MOUTH ONCE FOR 1 DOSE. WITH INTERCOURSE   norethindrone-ethinyl estradiol (JUNEL 1/20) 1-20 MG-MCG tablet Take 1 tablet by mouth daily.   omeprazole (PRILOSEC) 40 MG capsule TAKE 1 CAPSULE BY MOUTH EVERY DAY   pregabalin (LYRICA) 150 MG capsule TAKE 1 CAPSULE BY MOUTH TWICE A DAY   Probiotic Product (PROBIOTIC-10 PO) Take by mouth.   promethazine (PHENERGAN) 12.5 MG tablet Take by mouth.   tiZANidine (ZANAFLEX) 4 MG tablet TAKE 1 TABLET BY MOUTH EVERY 8 HOURS AS NEEDED FOR MUSCLE SPASM   valACYclovir (VALTREX) 1000 MG tablet TAKE ONE PILL DAILY. INCREASE TO TWICE DAILY WITH OUTBREAKS   No facility-administered encounter medications on file as of 05/09/2020.    Current Diagnosis: Patient Active Problem List   Diagnosis Date Noted   Myalgia 10/20/2018   Diarrhea 10/20/2018   Dysphagia 06/27/2018   Elevated serum creatinine 09/29/2017   Acute cystitis without hematuria 09/23/2017   Abdominal pain 09/23/2017   Gastroparesis 10/28/2016   Prediabetes 08/31/2016   Genetic testing 08/29/2016   Depression 08/28/2016   Family history of colon cancer    Family history of breast cancer    Obese 03/15/2015  Chronic migraine without aura without status migrainosus, not intractable 06/20/2014   Tear meniscus knee 06/14/2010   Dysautonomia - POTS 03/20/2010   Anxiety 07/30/2009   Essential hypertension 07/28/2009   SYNCOPE AND COLLAPSE 07/10/2009   PALPITATIONS 07/10/2009   POLYCYSTIC OVARIAN DISEASE 08/31/2008   Hypercholesterolemia with hypertriglyceridemia 08/31/2008   GERD 08/31/2008   Chronic back pain 08/31/2008    Have you seen any other providers since your last visit with Lehman Prom, CPP?  No, patient has not seen any other providers since her last visit with Lehman Prom, CPP.  Have you had any problems recently with your health?  Patient states she has not had any problems recently with her health.  Have you had any problems with your pharmacy?  Patient states she has not had any problems with her pharmacy.  What issues or side effects are you having with your medications?  Patient states she is not currently having any issues or side effects from any of her medications.  What would you like me to pass along to [facility], CPP for her to help you with?   Patient states she feels as if she needs to increase her medication Lyrica. Patient states she is experiencing a lot of nerve pain and Lyrica is no longer keeping it controlled at 150 mg twice a day.   Patient is requesting Dr. [name] to refill her medication hyoscyamine. Patient states her GI was prescribing this medication to her and now needs to get Dr. [name] to prescribe.  What can we do to take care of you better?  Patient states she is doing well at this time.  April D Calhoun, [facility] Clinical Pharmacist Assistant [phone]    Follow-Up:  Pharmacist Review  23 min

## 2020-05-14 ENCOUNTER — Other Ambulatory Visit: Payer: Self-pay | Admitting: Internal Medicine

## 2020-05-14 DIAGNOSIS — A609 Anogenital herpesviral infection, unspecified: Secondary | ICD-10-CM

## 2020-05-14 MED ORDER — HYOSCYAMINE SULFATE 0.125 MG SL SUBL: SUBLINGUAL_TABLET | SUBLINGUAL | 1 refills | Status: DC

## 2020-05-14 MED ORDER — VALACYCLOVIR HCL 1 G PO TABS
ORAL_TABLET | ORAL | 2 refills | Status: DC
Start: 2020-05-14 — End: 2020-08-07

## 2020-05-15 NOTE — Telephone Encounter (Signed)
Appointment made (virtual)

## 2020-05-19 ENCOUNTER — Telehealth: Payer: Self-pay | Admitting: Pharmacist

## 2020-05-19 NOTE — Progress Notes (Signed)
Chronic Care Management Pharmacy Assistant   Name: Kayla Floyd  MRN: [id number] DOB: 04-30-1980  Reason for Encounter:Chart Review   PCP : Pincus Sanes, MD  Allergies:   Allergies  Allergen Reactions  . Beef-Derived Products     anaphylaxes   . Glycerol, Iodinated Anaphylaxis    anaphylaxes   . Pork-Derived Products     anaplaxis    Medications: Outpatient Encounter Medications as of 05/19/2020  Medication Sig  . atenolol (TENORMIN) 25 MG tablet TAKE 1 TABLET BY MOUTH EVERY DAY. PLEASE SCHEDULE FOLLOW UP FOR FUTURE REFILLS  . atorvastatin (LIPITOR) 10 MG tablet TAKE 1 TABLET BY MOUTH EVERY DAY  . b complex vitamins tablet Take 1 tablet by mouth daily.  . baclofen (LIORESAL) 10 MG tablet TAKE 1 TABLET BY MOUTH 3 TIMES A DAY AS NEEDED FOR MUSCLE SPASMS  . botulinum toxin Type A (BOTOX) 100 UNITS SOLR injection Inject 100 Units into the muscle every 3 (three) months. Every 12 weeks at [facility]  . cetirizine (ZYRTEC) 10 MG tablet Take 10 mg by mouth daily. Reported on 07/13/2015  . diclofenac (VOLTAREN) 75 MG EC tablet Take 1 tablet (75 mg total) by mouth 2 (two) times daily. Will need a follow up appointment for additional refills  . DULoxetine (CYMBALTA) 60 MG capsule TAKE 1 CAPSULE BY MOUTH EVERY DAY. Will need a follow up appointment for additional refills  . fenofibrate (TRICOR) 145 MG tablet TAKE 1 TABLET (145MG  TOTAL) BY MOUTH EVERY DAY. PLEASE SCHEDULE FOLLOW UP FOR FUTURE REFILLS  . hyoscyamine (LEVSIN SL) 0.125 MG SL tablet Place 1 tablet (0.125 mg total) under the tongue every 6 (six) hours as needed.  . hyoscyamine (LEVSIN SL) 0.125 MG SL tablet PLACE 1 TABLET (0.125 MG TOTAL) UNDER THE TONGUE EVERY 6 (SIX) HOURS AS NEEDED.  ibuprofen (ADVIL,MOTRIN) 200 MG tablet Take by mouth.  . MELATONIN PO Take 6 mg by mouth daily.  . metFORMIN (GLUCOPHAGE) 1000 MG tablet TAKE 1 TABLET BY MOUTH TWICE A DAY WITH MEALS  . mirtazapine (REMERON) 45 MG tablet TAKE 1  TABLET (45 MG TOTAL) BY MOUTH AT BEDTIME.  . Multiple Vitamin (MULTIVITAMIN) tablet Take 1 tablet by mouth daily.    . nitrofurantoin, macrocrystal-monohydrate, (MACROBID) 100 MG capsule TAKE 1 CAPSULE BY MOUTH ONCE FOR 1 DOSE. WITH INTERCOURSE  . norethindrone-ethinyl estradiol (JUNEL 1/20) 1-20 MG-MCG tablet Take 1 tablet by mouth daily.  2/20 omeprazole (PRILOSEC) 40 MG capsule TAKE 1 CAPSULE BY MOUTH EVERY DAY  . pregabalin (LYRICA) 150 MG capsule TAKE 1 CAPSULE BY MOUTH TWICE A DAY  . Probiotic Product (PROBIOTIC-10 PO) Take by mouth.  . promethazine (PHENERGAN) 12.5 MG tablet Take by mouth.  Marland Kitchen tiZANidine (ZANAFLEX) 4 MG tablet TAKE 1 TABLET BY MOUTH EVERY 8 HOURS AS NEEDED FOR MUSCLE SPASM  . valACYclovir (VALTREX) 1000 MG tablet TAKE ONE PILL DAILY. INCREASE TO TWICE DAILY WITH OUTBREAKS   No facility-administered encounter medications on file as of 05/19/2020.    Current Diagnosis: Patient Active Problem List   Diagnosis Date Noted  . Myalgia 10/20/2018  . Diarrhea 10/20/2018  . Dysphagia 06/27/2018  . Elevated serum creatinine 09/29/2017  . Acute cystitis without hematuria 09/23/2017  . Abdominal pain 09/23/2017  . Gastroparesis 10/28/2016  . Prediabetes 08/31/2016  . Genetic testing 08/29/2016  . Depression 08/28/2016  . Family history of colon cancer   . Family history of breast cancer   . Obese 03/15/2015  . Chronic migraine  without aura without status migrainosus, not intractable 06/20/2014  . Tear meniscus knee 06/14/2010  . Dysautonomia - POTS 03/20/2010  . Anxiety 07/30/2009  . Essential hypertension 07/28/2009  . SYNCOPE AND COLLAPSE 07/10/2009  . PALPITATIONS 07/10/2009  . POLYCYSTIC OVARIAN DISEASE 08/31/2008  . Hypercholesterolemia with hypertriglyceridemia 08/31/2008  . GERD 08/31/2008  . Chronic back pain 08/31/2008    Goals Addressed   None     Follow-Up:  Pharmacist Review  Reviewed chart for medication changes and adherence.  No  office,consults,or hospital visits since last care coordination call with clinical pharmacist. No medication changes indicated  No gaps in adherence identified. Patient has follow up scheduled with pharmacy team. No further action required.   Horald Pollen, CPA [facility] [phone]

## 2020-05-22 ENCOUNTER — Encounter: Payer: Self-pay | Admitting: Genetic Counselor

## 2020-05-22 NOTE — Progress Notes (Signed)
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 The change in variant classification was made as a result of re-review of the evidence in light of new variant interpretation guidelines and/or new information.

## 2020-05-30 NOTE — Progress Notes (Signed)
Virtual Visit via telephone note  I connected with Kayla Floyd on 05/30/20 at  1:00 PM EST by a video, but we were both unable to hear each other and this was converted into a telephone encounter.  I verified that I am speaking with the correct person using two identifiers.   I discussed the limitations of evaluation and management by telemedicine and the availability of in person appointments. The patient expressed understanding and agreed to proceed.  Present for the visit:  Myself, Dr Cheryll Cockayne, [name].  The patient is currently at home and I am in the office.    No referring provider.    History of Present Illness: She is here for follow up of her chronic medical conditions - POTS, htn, prediabetes, hyperlipidemia, anxiety, depression, chronic back pain    She is taking all of her medications as prescribed.     She is having more nerve pain.  Pain in lower back.  femoral nerve in left leg is increased.  Bottom of feet also hurting more.  At one point in time PT helped her thigh pain.    Has had more palpitations - ? Related to IVF cycle and egg retrieval.    Review of Systems  Constitutional: Negative for fever.  Respiratory: Negative for cough, sputum production and wheezing.   Cardiovascular: Positive for chest pain (mild w palps) and palpitations. Negative for leg swelling.  Neurological: Positive for dizziness and headaches.  Psychiatric/Behavioral: Positive for depression. The patient is nervous/anxious.       Social History   Socioeconomic History  . Marital status: Married    Spouse name: Not on file  . Number of children: Not on file  . Years of education: Not on file  . Highest education level: Not on file  Occupational History  . Occupation: part-time Art gallery manager  Tobacco Use  . Smoking status: Former Smoker    Packs/day: 1.00    Years: 10.00    Pack years: 10.00    Quit date: 03/25/2005    Years since quitting: 15.1  . Smokeless tobacco:  Never Used  Vaping Use  . Vaping Use: Never used  Substance and Sexual Activity  . Alcohol use: Yes    Alcohol/week: 0.0 standard drinks    Comment: occasionally  . Drug use: No  . Sexual activity: Yes    Partners: Male    Birth control/protection: Pill    Comment: 1st intercourse 40 yo-More than 5 partners, married- 1 yr  Other Topics Concern  . Not on file  Social History Narrative   Disability since 10/2012 due to POTS, 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[employer]      Some exercise on a regular exericse   Social Determinants of Health   Financial Resource Strain: Low Risk   . Difficulty of Paying Living Expenses: Not hard at all  Food Insecurity: Not on file  Transportation Needs: Not on file  Physical Activity: Not on file  Stress: Not on file  Social Connections: Not on file     Observations/Objective: Appears well in NAD Breathing normally Mood and affect normal  Assessment and Plan:  See Problem List for Assessment and Plan of chronic medical problems.   Follow Up Instructions:    I discussed the assessment and treatment plan with the patient. The patient was provided an opportunity to ask questions and all were answered. The patient agreed with the plan and demonstrated an understanding of the instructions.   The patient was  advised to call back or seek an in-person evaluation if the symptoms worsen or if the condition fails to improve as anticipated.  Time spent on telephone call: 15 minutes  Pincus Sanes, MD

## 2020-05-31 ENCOUNTER — Encounter: Payer: Self-pay | Admitting: Internal Medicine

## 2020-05-31 ENCOUNTER — Telehealth (INDEPENDENT_AMBULATORY_CARE_PROVIDER_SITE_OTHER): Payer: BC Managed Care – PPO | Admitting: Internal Medicine

## 2020-05-31 DIAGNOSIS — I10 Essential (primary) hypertension: Secondary | ICD-10-CM

## 2020-05-31 DIAGNOSIS — M549 Dorsalgia, unspecified: Secondary | ICD-10-CM

## 2020-05-31 DIAGNOSIS — G8929 Other chronic pain: Secondary | ICD-10-CM

## 2020-05-31 DIAGNOSIS — E782 Mixed hyperlipidemia: Secondary | ICD-10-CM | POA: Diagnosis not present

## 2020-05-31 DIAGNOSIS — G901 Familial dysautonomia [Riley-Day]: Secondary | ICD-10-CM | POA: Diagnosis not present

## 2020-05-31 DIAGNOSIS — R7303 Prediabetes: Secondary | ICD-10-CM | POA: Diagnosis not present

## 2020-05-31 DIAGNOSIS — F3289 Other specified depressive episodes: Secondary | ICD-10-CM

## 2020-05-31 DIAGNOSIS — F419 Anxiety disorder, unspecified: Secondary | ICD-10-CM | POA: Diagnosis not present

## 2020-05-31 MED ORDER — DULOXETINE HCL 30 MG PO CPEP: 30.0000 mg | ORAL_CAPSULE | Freq: Every day | ORAL | 1 refills | Status: DC

## 2020-05-31 NOTE — Assessment & Plan Note (Signed)
Chronic Controlled, stable Currently taking Cymbalta 60 mg daily and Remeron 45 mg at bedtime Her neuropathy is not well controlled and we will try increasing the Cymbalta to 90 mg daily Discussed that she is on both of these medications and may have side effects and if she has any side effects she needs to let me know immediately and stop the medication.  We also discussed that if the addition of the Cymbalta does not help her neuropathy she will discontinue it

## 2020-05-31 NOTE — Assessment & Plan Note (Signed)
Chronic Controlled, stable Continue Cymbalta-currently taking 60 mg daily-we will increase to 90 mg daily for her neuropathy

## 2020-05-31 NOTE — Assessment & Plan Note (Signed)
Chronic Continue fenofibrate 145 mg daily and atorvastatin 10 mg daily We will need to do blood work at her next visit

## 2020-05-31 NOTE — Assessment & Plan Note (Signed)
Chronic Encouraged increased activity and low sugar/carb diet We will check blood work at her next visit

## 2020-05-31 NOTE — Assessment & Plan Note (Signed)
Chronic Blood pressure well controlled at home Continue atenolol 25 mg daily

## 2020-05-31 NOTE — Assessment & Plan Note (Signed)
Chronic Pain has increased We will increase Cymbalta from 60 mg daily to 90 mg daily Continue Lyrica 150 mg twice daily Continue diclofenac 75 mg twice daily as needed Continue baclofen 10 mg 3 times daily as needed for tizanidine 4 mg 3 times daily as needed-she knows not to take these at the same time Consider PT when she is settled in her new house

## 2020-05-31 NOTE — Assessment & Plan Note (Signed)
Chronic Following with cardiology  

## 2020-06-08 ENCOUNTER — Other Ambulatory Visit: Payer: Self-pay | Admitting: Family

## 2020-06-08 ENCOUNTER — Other Ambulatory Visit: Payer: Self-pay | Admitting: Internal Medicine

## 2020-06-14 DIAGNOSIS — G43719 Chronic migraine without aura, intractable, without status migrainosus: Secondary | ICD-10-CM | POA: Diagnosis not present

## 2020-06-21 ENCOUNTER — Ambulatory Visit (INDEPENDENT_AMBULATORY_CARE_PROVIDER_SITE_OTHER): Payer: BC Managed Care – PPO

## 2020-06-21 DIAGNOSIS — Z0000 Encounter for general adult medical examination without abnormal findings: Secondary | ICD-10-CM | POA: Diagnosis not present

## 2020-06-21 NOTE — Progress Notes (Signed)
I connected with Kayla Floyd today by telephone and verified that I am speaking with the correct person using two identifiers. Location patient: home Location provider: work Persons participating in the virtual visit: Kayla Floyd and [employer], LPN.   I discussed the limitations, risks, security and privacy concerns of performing an evaluation and management service by telephone and the availability of in person appointments. I also discussed with the patient that there may be a patient responsible charge related to this service. The patient expressed understanding and verbally consented to this telephonic visit.    Interactive audio and video telecommunications were attempted between this provider and patient, however failed, due to patient having technical difficulties OR patient did not have access to video capability.  We continued and completed visit with audio only.  Some vital signs may be absent or patient reported.   Time Spent with patient on telephone encounter: 30 minutes  Subjective:   Kayla Floyd is a 40 y.o. female who presents for Medicare Annual (Subsequent) preventive examination.  Review of Systems    No ROS. Medicare Wellness Virtual Visit. Additional risk factors are reflected in social history. Cardiac Risk Factors include: dyslipidemia;hypertension;family history of premature cardiovascular disease;smoking/ tobacco exposure     Objective:    Today's Vitals   06/21/20 1513  PainSc: 4    There is no height or weight on file to calculate BMI.  Advanced Directives 06/21/2020 01/12/2019  Does Patient Have a Medical Advance Directive? No No  Would patient like information on creating a medical advance directive? No - Patient declined No - Patient declined    Current Medications (verified) Outpatient Encounter Medications as of 06/21/2020  Medication Sig  . atenolol (TENORMIN) 25 MG tablet TAKE 1 TABLET BY MOUTH EVERY DAY.  Marland Kitchen atorvastatin  (LIPITOR) 10 MG tablet TAKE 1 TABLET BY MOUTH EVERY DAY  . b complex vitamins tablet Take 1 tablet by mouth daily.  . baclofen (LIORESAL) 10 MG tablet TAKE 1 TABLET BY MOUTH 3 TIMES A DAY AS NEEDED FOR MUSCLE SPASMS  . botulinum toxin Type A (BOTOX) 100 UNITS SOLR injection Inject 100 Units into the muscle every 3 (three) months. Every 12 weeks at [facility]  . cetirizine (ZYRTEC) 10 MG tablet Take 10 mg by mouth daily. Reported on 07/13/2015  . diclofenac (VOLTAREN) 75 MG EC tablet Take 1 tablet (75 mg total) by mouth 2 (two) times daily. Will need a follow up appointment for additional refills  . DULoxetine (CYMBALTA) 30 MG capsule Take 1 capsule (30 mg total) by mouth daily. Take daily in addition to 60 mg daily for 90 mg daily  . DULoxetine (CYMBALTA) 60 MG capsule TAKE 1 CAPSULE BY MOUTH EVERY DAY. Will need a follow up appointment for additional refills  . fenofibrate (TRICOR) 145 MG tablet TAKE 1 TABLET (145MG TOTAL) BY MOUTH EVERY DAY. PLEASE SCHEDULE FOLLOW UP FOR FUTURE REFILLS  . hyoscyamine (LEVSIN SL) 0.125 MG SL tablet Place 1 tablet (0.125 mg total) under the tongue every 6 (six) hours as needed.  . hyoscyamine (LEVSIN SL) 0.125 MG SL tablet PLACE 1 TABLET (0.125 MG TOTAL) UNDER THE TONGUE EVERY 6 (SIX) HOURS AS NEEDED.  Marland Kitchen ibuprofen (ADVIL,MOTRIN) 200 MG tablet Take by mouth.  . MELATONIN PO Take 6 mg by mouth daily.  . metFORMIN (GLUCOPHAGE) 1000 MG tablet TAKE 1 TABLET BY MOUTH TWICE A DAY WITH MEALS  . mirtazapine (REMERON) 45 MG tablet TAKE 1 TABLET (45 MG TOTAL) BY MOUTH  AT BEDTIME.  . Multiple Vitamin (MULTIVITAMIN) tablet Take 1 tablet by mouth daily.    . nitrofurantoin, macrocrystal-monohydrate, (MACROBID) 100 MG capsule TAKE 1 CAPSULE BY MOUTH ONCE FOR 1 DOSE. WITH INTERCOURSE  . norethindrone-ethinyl estradiol (JUNEL 1/20) 1-20 MG-MCG tablet Take 1 tablet by mouth daily.  Marland Kitchen omeprazole (PRILOSEC) 40 MG capsule TAKE 1 CAPSULE BY MOUTH EVERY DAY  . pregabalin (LYRICA)  150 MG capsule TAKE 1 CAPSULE BY MOUTH TWICE A DAY  . Probiotic Product (PROBIOTIC-10 PO) Take by mouth.  . promethazine (PHENERGAN) 12.5 MG tablet Take by mouth.  Marland Kitchen tiZANidine (ZANAFLEX) 4 MG tablet TAKE 1 TABLET BY MOUTH EVERY 8 HOURS AS NEEDED FOR MUSCLE SPASMS  . valACYclovir (VALTREX) 1000 MG tablet TAKE ONE PILL DAILY. INCREASE TO TWICE DAILY WITH OUTBREAKS   No facility-administered encounter medications on file as of 06/21/2020.    Allergies (verified) Beef-derived products; Glycerol, iodinated; and Pork-derived products   History: Past Medical History:  Diagnosis Date  . ANXIETY   . BACK PAIN, CHRONIC   . DYSAUTONOMIA 2011   "POTS" , recurrent syncope - follows with cards for same  . GERD   . 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 HYPERLIPIDEMIA   . Kidney failure   . MIGRAINE HEADACHE   . Palpitations   . POLYCYSTIC OVARIAN DISEASE   . POTS (postural orthostatic tachycardia syndrome)   . Tear meniscus knee 06/14/2010   right   Past Surgical History:  Procedure Laterality Date  . Cervical spine  2010   ACDF c5-6  . crevical fusion   2010  . evs    . LUMBAR LAMINECTOMY  2008  . TONSILLECTOMY AND ADENOIDECTOMY     Family History  Problem Relation Age of Onset  . Bipolar disorder Mother   . Stroke Mother        Age of 51 while on OCP  . Other Father        MVA  . Heart disease Father   . Heart attack Father   . Colon cancer Maternal Grandmother 40  . Cancer Maternal Grandmother   . Hypertension Sister   . Hypertension Sister   . Migraines Sister   . Breast cancer Paternal Grandmother        dx in her 30s-40s  . Cancer Paternal Uncle        ocular  . Breast cancer Paternal Aunt 56  . Lung cancer Paternal Uncle    Social History   Socioeconomic History  . Marital status: Married    Spouse name: Not on file  . Number of children: Not on file  . Years of education: Not on file  . Highest education level: Not on file   Occupational History  . Occupation: part-time Location manager  Tobacco Use  . Smoking status: Heavy Tobacco Smoker    Packs/day: 1.00    Years: 10.00    Pack years: 10.00    Types: Cigarettes  . Smokeless tobacco: Never Used  . Tobacco comment: 1-2 cigarettes per day  Vaping Use  . Vaping Use: Never used  Substance and Sexual Activity  . Alcohol use: Yes    Alcohol/week: 0.0 standard drinks    Comment: occasionally  . Drug use: No  . Sexual activity: Yes    Partners: Male    Birth control/protection: Pill    Comment: 1st intercourse 40 yo-More than 5 partners, married- 1 yr  Other Topics Concern  . Not on file  Social History Narrative   Disability since 10/2012 due to POTS, 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[employer]      Some exercise on a regular exericse   Social Determinants of Health   Financial Resource Strain: Low Risk   . Difficulty of Paying Living Expenses: Not hard at all  Food Insecurity: No Food Insecurity  . Worried About Charity fundraiser in the Last Year: Never true  . Ran Out of Food in the Last Year: Never true  Transportation Needs: No Transportation Needs  . Lack of Transportation (Medical): No  . Lack of Transportation (Non-Medical): No  Physical Activity: Inactive  . Days of Exercise per Week: 0 days  . Minutes of Exercise per Session: 0 min  Stress: No Stress Concern Present  . Feeling of Stress : Not at all  Social Connections: Not on file    Tobacco Counseling Ready to quit: No Counseling given: Not Answered Comment: 1-2 cigarettes per day   Clinical Intake:  Pre-visit preparation completed: Yes  Pain : 0-10 Pain Score: 4  Pain Type: Chronic pain Pain Location: Other (Comment) (pain all over body due to dorsalgia) Pain Orientation: Other (Comment) (all over the body; especially Lower Back) Pain Radiating Towards: all over Pain Descriptors / Indicators: Constant,Aching,Throbbing,Discomfort,Dull Pain Onset: More than a month ago Pain Frequency:  Constant Pain Relieving Factors: robaxin, lyrica, zanaflex, etc. Effect of Pain on Daily Activities: Pain can diminish job performance, lower motivation to exercise, and prevent you from completing daily tasks. Pain produces disability and affects the quality of life.  Pain Relieving Factors: robaxin, lyrica, zanaflex, etc.  Diabetes: No  How often do you need to have someone help you when you read instructions, pamphlets, or other written materials from your doctor or pharmacy?: 1 - Never What is the last grade level you completed in school?: Associates degree in Registered Nursing  Diabetic no  Interpreter Needed?: No  Information entered by :: Lisette Abu, LPN   Activities of Daily Living In your present state of health, do you have any difficulty performing the following activities: 06/21/2020  Hearing? N  Vision? N  Difficulty concentrating or making decisions? Y  Comment yes, with brain fog  Walking or climbing stairs? Y  Dressing or bathing? Y  Doing errands, shopping? N  Preparing Food and eating ? N  Using the Toilet? N  In the past six months, have you accidently leaked urine? N  Do you have problems with loss of bowel control? N  Managing your Medications? N  Managing your Finances? N  Housekeeping or managing your Housekeeping? Y  Some recent data might be hidden    Patient Care Team: Binnie Rail, MD as PCP - General (Internal Medicine) Minus Breeding, MD (Cardiology) Marchia Bond, MD (Orthopedic Surgery) Everardo Pacific, [place] (Nurse Practitioner) Charlton Haws, [facility] as Pharmacist (Pharmacist)  Indicate any recent Medical Services you may have received from other than [facility] providers in the past year (date may be approximate).     Assessment:   This is a routine wellness examination for Kayla Floyd.  Hearing/Vision screen No exam data present  Dietary issues and exercise activities discussed: Current Exercise Habits: The patient does  not participate in regular exercise at present, Exercise limited by: neurologic condition(s)  Goals    . Patient Stated     I want to work      . Patient Stated     I want a better quality of living and to be more  active.    Marland Kitchen Pharmacy Care Plan     CARE PLAN ENTRY  Current Barriers:  . Chronic Disease Management support, education, and care coordination needs related to Hypertension, Hyperlipidemia, Depression, and Prediabetes   Hypertension/Dysautonomia BP Readings from Last 3 Encounters:  05/24/19 114/74  11/27/18 120/78  11/20/18 102/64 .  Pharmacist Clinical Goal(s): o Over the next 180 days, patient will work with PharmD and providers to maintain BP goal <130/80 . Current regimen:  o Atenolol 25 mg daily (also helps migraines) . Interventions: o Discussed benefits of atenolol for both dysautonomia and migraines . Patient self care activities - Over the next 180 days, patient will: o Check BP as needed, document, and provide at future appointments o Ensure daily salt intake < 2300 mg/day  Hyperlipidemia Lab Results  Component Value Date/Time   LDLCALC 92 04/07/2013 12:00 AM   LDLDIRECT 167.0 05/24/2019 04:21 PM   TRIG 274.0 (H) 05/24/2019 04:21 PM   TRIG 378 06/06/2008 12:00 AM .  Pharmacist Clinical Goal(s): o Over the next 180 days, patient will work with PharmD and providers to achieve LDL goal < 100 and Triglycerides < 150 . Current regimen:  o Atorvastatin 10 mg daily o Fenofibrate 145 mg daily . Interventions: o Discussed benefits of statin and fibrate for LDL and triglyceride control as well as prevention of CV disease . Patient self care activities - Over the next 180 days, patient will: o Continue medications as prescribed o Continue control with diet and exercise  Prediabetes Lab Results  Component Value Date/Time   HGBA1C 5.7 05/24/2019 04:21 PM   HGBA1C 5.7 12/23/2017 04:05 PM .  Pharmacist Clinical Goal(s): o Over the next 180 days, patient will  work with PharmD and providers to maintain A1c goal <6.5% . Current regimen:  o Metformin 1000 mg twice a day (also for PCOS) . Interventions: o Benefits of metformin for prediabetes and PCOS . Patient self care activities - Over the next 180 days, patient will: o Continue medications as prescribed  Medication management . Pharmacist Clinical Goal(s): o Over the next 180 days, patient will work with PharmD and providers to maintain optimal medication adherence . Current pharmacy: [facility] . Interventions o Comprehensive medication review performed. o Continue current medication management strategy . Patient self care activities - Over the next 180 days, patient will: o Focus on medication adherence by pill pack o Take medications as prescribed o Report any questions or concerns to PharmD and/or provider(s)  Initial goal documentation      Depression Screen PHQ 2/9 Scores 06/21/2020 01/12/2019 06/20/2014  PHQ - 2 Score 0 1 0    Fall Risk Fall Risk  06/21/2020 01/12/2019 03/29/2015 06/20/2014  Falls in the past year? 0 0 Yes Yes  Number falls in past yr: 0 0 2 or more 1  Injury with Fall? 0 0 Yes No  Risk Factor Category  - - High Fall Risk -  Risk for fall due to : No Fall Risks - History of fall(s);Impaired balance/gait;Impaired mobility -  Follow up Falls evaluation completed - Falls evaluation completed;Falls prevention discussed Falls prevention discussed    FALL RISK PREVENTION PERTAINING TO THE HOME:  Any stairs in or around the home? Yes  If so, are there any without handrails? No  Home free of loose throw rugs in walkways, pet beds, electrical cords, etc? Yes  Adequate lighting in your home to reduce risk of falls? Yes   ASSISTIVE DEVICES UTILIZED TO PREVENT FALLS:  Life alert? No  Use of a cane, walker or w/c? No  Grab bars in the bathroom? No  Shower chair or bench in shower? No  Elevated toilet seat or a handicapped toilet? No   TIMED UP AND GO:  Was  the test performed? No .  Length of time to ambulate 10 feet: 0 sec.   Gait steady and fast without use of assistive device  Cognitive Function: Normal cognitive status assessed by direct observation by this Nurse Health Advisor. No abnormalities found.          Immunizations Immunization History  Administered Date(s) Administered  . DTP 03/31/1980, 05/02/1980, 06/07/1980, 07/05/1981, 11/09/1984  . Influenza Whole 12/23/2008  . Influenza,inj,Quad PF,6+ Mos 12/23/2017  . MMR 04/25/1981, 08/26/2003  . OPV 03/31/1980, 06/07/1980, 08/09/1980, 07/05/1981, 11/09/1984  . Td 11/11/1994, 03/25/2008  . Tdap 12/23/2017    TDAP status: Up to date  Flu Vaccine status: Due, Education has been provided regarding the importance of this vaccine. Advised may receive this vaccine at local pharmacy or Health Dept. Aware to provide a copy of the vaccination record if obtained from local pharmacy or Health Dept. Verbalized acceptance and understanding.  Pneumococcal vaccine status: Declined,  Education has been provided regarding the importance of this vaccine but patient still declined. Advised may receive this vaccine at local pharmacy or Health Dept. Aware to provide a copy of the vaccination record if obtained from local pharmacy or Health Dept. Verbalized acceptance and understanding.   Covid-19 vaccine status: Completed vaccines  Qualifies for Shingles Vaccine? No   Zostavax completed No   Shingrix Completed?: No.    Education has been provided regarding the importance of this vaccine. Patient has been advised to call insurance company to determine out of pocket expense if they have not yet received this vaccine. Advised may also receive vaccine at local pharmacy or Health Dept. Verbalized acceptance and understanding.  Screening Tests Health Maintenance  Topic Date Due  . COVID-19 Vaccine (1) Never done  . PAP SMEAR-Modifier  05/04/2018  . INFLUENZA VACCINE  10/24/2019  . TETANUS/TDAP   12/24/2027  . Hepatitis C Screening  Completed  . HIV Screening  Completed  . HPV VACCINES  Aged Out    Health Maintenance  Health Maintenance Due  Topic Date Due  . COVID-19 Vaccine (1) Never done  . PAP SMEAR-Modifier  05/04/2018  . INFLUENZA VACCINE  10/24/2019    Colorectal cancer screening: never done  Mammogram status: never done  Bone Density status: never done  Lung Cancer Screening: (Low Dose CT Chest recommended if Age 47-80 years, 30 pack-year currently smoking OR have quit w/in 15years.) does not qualify.   Lung Cancer Screening Referral: no  Additional Screening:  Hepatitis C Screening: does qualify; Completed yes  Vision Screening: Recommended annual ophthalmology exams for early detection of glaucoma and other disorders of the eye. Is the patient up to date with their annual eye exam?  Yes  Who is the provider or what is the name of the office in which the patient attends annual eye exams? [facility] If pt is not established with a provider, would they like to be referred to a provider to establish care? No .   Dental Screening: Recommended annual dental exams for proper oral hygiene  Community Resource Referral / Chronic Care Management: CRR required this visit?  No   CCM required this visit?  No      Plan:     I have personally reviewed and  noted the following in the patient's chart:   . Medical and social history . Use of alcohol, tobacco or illicit drugs  . Current medications and supplements . Functional ability and status . Nutritional status . Physical activity . Advanced directives . List of other physicians . Hospitalizations, surgeries, and ER visits in previous 12 months . Vitals . Screenings to include cognitive, depression, and falls . Referrals and appointments  In addition, I have reviewed and discussed with patient certain preventive protocols, quality metrics, and best practice recommendations. A written  personalized care plan for preventive services as well as general preventive health recommendations were provided to patient.     Sheral Flow, LPN   [id number]   Nurse Notes:  Patient is cogitatively intact. There were no vitals filed for this visit. There is no height or weight on file to calculate BMI. Patient stated that she has no issues with gait or balance; does not use any assistive devices. Medications reviewed with patient; no opioid use noted.

## 2020-06-21 NOTE — Patient Instructions (Signed)
Kayla Floyd , Thank you for taking time to come for your Medicare Wellness Visit. I appreciate your ongoing commitment to your health goals. Please review the following plan we discussed and let me know if I can assist you in the future.   Screening recommendations/referrals: Colonoscopy: never done Mammogram: never done Bone Density: never done Recommended yearly ophthalmology/optometry visit for glaucoma screening and checkup Recommended yearly dental visit for hygiene and checkup  Vaccinations: Influenza vaccine: overdue Pneumococcal vaccine: never done Tdap vaccine: 12/23/2017; due every 10 years Shingles vaccine: never done  Covid-19: Moderna 07/06/2019, 08/03/2019  Advanced directives: Advance directive discussed with you today. Even though you declined this today please call our office should you change your mind and we can give you the proper paperwork for you to fill out.  Conditions/risks identified: Yes; conditions and risks were identified and discussed with patient.  Next appointment: Please schedule your next Medicare Wellness Visit with your Nurse Health Advisor in 1 year by calling [phone].   Preventive Care 40-64 Years, Female Preventive care refers to lifestyle choices and visits with your health care provider that can promote health and wellness. What does preventive care include?  A yearly physical exam. This is also called an annual well check.  Dental exams once or twice a year.  Routine eye exams. Ask your health care provider how often you should have your eyes checked.  Personal lifestyle choices, including:  Daily care of your teeth and gums.  Regular physical activity.  Eating a healthy diet.  Avoiding tobacco and drug use.  Limiting alcohol use.  Practicing safe sex.  Taking low-dose aspirin daily starting at age 65.  Taking vitamin and mineral supplements as recommended by your health care provider. What happens during an annual well  check? The services and screenings done by your health care provider during your annual well check will depend on your age, overall health, lifestyle risk factors, and family history of disease. Counseling  Your health care provider may ask you questions about your:  Alcohol use.  Tobacco use.  Drug use.  Emotional well-being.  Home and relationship well-being.  Sexual activity.  Eating habits.  Work and work Statistician.  Method of birth control.  Menstrual cycle.  Pregnancy history. Screening  You may have the following tests or measurements:  Height, weight, and BMI.  Blood pressure.  Lipid and cholesterol levels. These may be checked every 5 years, or more frequently if you are over 12 years old.  Skin check.  Lung cancer screening. You may have this screening every year starting at age 41 if you have a 30-pack-year history of smoking and currently smoke or have quit within the past 15 years.  Fecal occult blood test (FOBT) of the stool. You may have this test every year starting at age 70.  Flexible sigmoidoscopy or colonoscopy. You may have a sigmoidoscopy every 5 years or a colonoscopy every 10 years starting at age 11.  Hepatitis C blood test.  Hepatitis B blood test.  Sexually transmitted disease (STD) testing.  Diabetes screening. This is done by checking your blood sugar (glucose) after you have not eaten for a while (fasting). You may have this done every 1-3 years.  Mammogram. This may be done every 1-2 years. Talk to your health care provider about when you should start having regular mammograms. This may depend on whether you have a family history of breast cancer.  BRCA-related cancer screening. This may be done if you have a family  history of breast, ovarian, tubal, or peritoneal cancers.  Pelvic exam and Pap test. This may be done every 3 years starting at age 7. Starting at age 17, this may be done every 5 years if you have a Pap test in  combination with an HPV test.  Bone density scan. This is done to screen for osteoporosis. You may have this scan if you are at high risk for osteoporosis. Discuss your test results, treatment options, and if necessary, the need for more tests with your health care provider. Vaccines  Your health care provider may recommend certain vaccines, such as:  Influenza vaccine. This is recommended every year.  Tetanus, diphtheria, and acellular pertussis (Tdap, Td) vaccine. You may need a Td booster every 10 years.  Zoster vaccine. You may need this after age 14.  Pneumococcal 13-valent conjugate (PCV13) vaccine. You may need this if you have certain conditions and were not previously vaccinated.  Pneumococcal polysaccharide (PPSV23) vaccine. You may need one or two doses if you smoke cigarettes or if you have certain conditions. Talk to your health care provider about which screenings and vaccines you need and how often you need them. This information is not intended to replace advice given to you by your health care provider. Make sure you discuss any questions you have with your health care provider. Document Released: 04/07/2015 Document Revised: 11/29/2015 Document Reviewed: 01/10/2015 Elsevier Interactive Patient Education  2017 [place] Prevention in the Home Falls can cause injuries. They can happen to people of all ages. There are many things you can do to make your home safe and to help prevent falls. What can I do on the outside of my home?  Regularly fix the edges of walkways and driveways and fix any cracks.  Remove anything that might make you trip as you walk through a door, such as a raised step or threshold.  Trim any bushes or trees on the path to your home.  Use bright outdoor lighting.  Clear any walking paths of anything that might make someone trip, such as rocks or tools.  Regularly check to see if handrails are loose or broken. Make sure that both  sides of any steps have handrails.  Any raised decks and porches should have guardrails on the edges.  Have any leaves, snow, or ice cleared regularly.  Use sand or salt on walking paths during winter.  Clean up any spills in your garage right away. This includes oil or grease spills. What can I do in the bathroom?  Use night lights.  Install grab bars by the toilet and in the tub and shower. Do not use towel bars as grab bars.  Use non-skid mats or decals in the tub or shower.  If you need to sit down in the shower, use a plastic, non-slip stool.  Keep the floor dry. Clean up any water that spills on the floor as soon as it happens.  Remove soap buildup in the tub or shower regularly.  Attach bath mats securely with double-sided non-slip rug tape.  Do not have throw rugs and other things on the floor that can make you trip. What can I do in the bedroom?  Use night lights.  Make sure that you have a light by your bed that is easy to reach.  Do not use any sheets or blankets that are too big for your bed. They should not hang down onto the floor.  Have a firm chair  that has side arms. You can use this for support while you get dressed.  Do not have throw rugs and other things on the floor that can make you trip. What can I do in the kitchen?  Clean up any spills right away.  Avoid walking on wet floors.  Keep items that you use a lot in easy-to-reach places.  If you need to reach something above you, use a strong step stool that has a grab bar.  Keep electrical cords out of the way.  Do not use floor polish or wax that makes floors slippery. If you must use wax, use non-skid floor wax.  Do not have throw rugs and other things on the floor that can make you trip. What can I do with my stairs?  Do not leave any items on the stairs.  Make sure that there are handrails on both sides of the stairs and use them. Fix handrails that are broken or loose. Make sure that  handrails are as long as the stairways.  Check any carpeting to make sure that it is firmly attached to the stairs. Fix any carpet that is loose or worn.  Avoid having throw rugs at the top or bottom of the stairs. If you do have throw rugs, attach them to the floor with carpet tape.  Make sure that you have a light switch at the top of the stairs and the bottom of the stairs. If you do not have them, ask someone to add them for you. What else can I do to help prevent falls?  Wear shoes that:  Do not have high heels.  Have rubber bottoms.  Are comfortable and fit you well.  Are closed at the toe. Do not wear sandals.  If you use a stepladder:  Make sure that it is fully opened. Do not climb a closed stepladder.  Make sure that both sides of the stepladder are locked into place.  Ask someone to hold it for you, if possible.  Clearly mark and make sure that you can see:  Any grab bars or handrails.  First and last steps.  Where the edge of each step is.  Use tools that help you move around (mobility aids) if they are needed. These include:  Canes.  Walkers.  Scooters.  Crutches.  Turn on the lights when you go into a dark area. Replace any light bulbs as soon as they burn out.  Set up your furniture so you have a clear path. Avoid moving your furniture around.  If any of your floors are uneven, fix them.  If there are any pets around you, be aware of where they are.  Review your medicines with your doctor. Some medicines can make you feel dizzy. This can increase your chance of falling. Ask your doctor what other things that you can do to help prevent falls. This information is not intended to replace advice given to you by your health care provider. Make sure you discuss any questions you have with your health care provider. Document Released: 01/05/2009 Document Revised: 08/17/2015 Document Reviewed: 04/15/2014 Elsevier Interactive Patient Education  2017  [employer].

## 2020-07-01 ENCOUNTER — Other Ambulatory Visit: Payer: Self-pay | Admitting: Family

## 2020-07-07 ENCOUNTER — Other Ambulatory Visit: Payer: Self-pay | Admitting: Internal Medicine

## 2020-08-04 ENCOUNTER — Telehealth: Payer: Self-pay | Admitting: Pharmacist

## 2020-08-04 NOTE — Progress Notes (Signed)
Chronic Care Management Pharmacy Assistant   Name: CLAUDEEN LEASON  MRN: [id number] DOB: Jun 23, 1980   Reason for Encounter: Disease State - Hypertension    Recent office visits:  05/31/20 [name] (PCP) - Virtual Hypertension, Anxiety. Change CYMBALTA 60 MG to 90 mg daily.    Recent consult visits:  03/14/20 [facility] (Psychiatry) - Chronic migraine without aura. Botox Injection given.  12/10/19 [facility] (Reproductive [facility] Inf[facility])   09/29/19 [facility]) - New Patient  Hospital visits:  None in previous 6 months  Medications: Outpatient Encounter Medications as of 08/04/2020  Medication Sig  . atenolol (TENORMIN) 25 MG tablet TAKE 1 TABLET BY MOUTH EVERY DAY.  Marland Kitchen atorvastatin (LIPITOR) 10 MG tablet TAKE 1 TABLET BY MOUTH EVERY DAY  . b complex vitamins tablet Take 1 tablet by mouth daily.  . baclofen (LIORESAL) 10 MG tablet TAKE 1 TABLET BY MOUTH THREE TIMES A DAY AS NEEDED FOR MUSCLE SPASM  . botulinum toxin Type A (BOTOX) 100 UNITS SOLR injection Inject 100 Units into the muscle every 3 (three) months. Every 12 weeks at [facility]  . cetirizine (ZYRTEC) 10 MG tablet Take 10 mg by mouth daily. Reported on 07/13/2015  . diclofenac (VOLTAREN) 75 MG EC tablet TAKE 1 TABLET (75 MG TOTAL) BY MOUTH 2 (TWO) TIMES DAILY. WILL NEED A FOLLOW UP APPOINTMENT FOR ADDITIONAL REFILLS  . DULoxetine (CYMBALTA) 30 MG capsule Take 1 capsule (30 mg total) by mouth daily. Take daily in addition to 60 mg daily for 90 mg daily  . DULoxetine (CYMBALTA) 60 MG capsule TAKE 1 CAPSULE BY MOUTH EVERY DAY. Will need a follow up appointment for additional refills  . fenofibrate (TRICOR) 145 MG tablet TAKE 1 TABLET (145MG  TOTAL) BY MOUTH EVERY DAY. PLEASE SCHEDULE FOLLOW UP FOR FUTURE REFILLS  . hyoscyamine (LEVSIN SL) 0.125 MG SL tablet Place 1 tablet (0.125 mg total) under the tongue every 6 (six) hours as needed.  . hyoscyamine (LEVSIN SL) 0.125 MG SL tablet PLACE 1 TABLET (0.125 MG TOTAL)  UNDER THE TONGUE EVERY 6 (SIX) HOURS AS NEEDED.  ibuprofen (ADVIL,MOTRIN) 200 MG tablet Take by mouth.  . MELATONIN PO Take 6 mg by mouth daily.  . metFORMIN (GLUCOPHAGE) 1000 MG tablet TAKE 1 TABLET BY MOUTH TWICE A DAY WITH MEALS  . mirtazapine (REMERON) 45 MG tablet TAKE 1 TABLET BY MOUTH AT BEDTIME.  . Multiple Vitamin (MULTIVITAMIN) tablet Take 1 tablet by mouth daily.    . nitrofurantoin, macrocrystal-monohydrate, (MACROBID) 100 MG capsule TAKE 1 CAPSULE BY MOUTH ONCE FOR 1 DOSE. WITH INTERCOURSE  . norethindrone-ethinyl estradiol (JUNEL 1/20) 1-20 MG-MCG tablet Take 1 tablet by mouth daily.  2/20 omeprazole (PRILOSEC) 40 MG capsule TAKE 1 CAPSULE BY MOUTH EVERY DAY  . pregabalin (LYRICA) 150 MG capsule TAKE 1 CAPSULE BY MOUTH TWICE A DAY  . Probiotic Product (PROBIOTIC-10 PO) Take by mouth.  . promethazine (PHENERGAN) 12.5 MG tablet Take by mouth.  Marland Kitchen tiZANidine (ZANAFLEX) 4 MG tablet TAKE 1 TABLET BY MOUTH EVERY 8 HOURS AS NEEDED FOR MUSCLE SPASMS  . valACYclovir (VALTREX) 1000 MG tablet TAKE ONE PILL DAILY. INCREASE TO TWICE DAILY WITH OUTBREAKS   No facility-administered encounter medications on file as of 08/04/2020.    Reviewed chart prior to disease state call. Spoke with patient regarding BP  Recent Office Vitals: BP Readings from Last 3 Encounters:  05/24/19 114/74  11/27/18 120/78  11/20/18 102/64   Pulse Readings from Last 3 Encounters:  05/24/19 94  11/20/18 92  12/23/17 86    Wt Readings from Last 3 Encounters:  05/24/19 196 lb (88.9 kg)  11/27/18 196 lb (88.9 kg)  11/20/18 190 lb (86.2 kg)     Kidney Function Lab Results  Component Value Date/Time   CREATININE 0.77 05/24/2019 04:21 PM   CREATININE 0.93 12/23/2017 04:05 PM   GFR 83.29 05/24/2019 04:21 PM   GFRNONAA >90 04/23/2011 03:36 AM   GFRAA >90 04/23/2011 03:36 AM    BMP Latest Ref Rng & Units 05/24/2019 12/23/2017 09/29/2017  Glucose 70 - 99 mg/dL 89 84 88  BUN 6 - 23 mg/dL 11 12 11   Creatinine 0.40  - 1.20 mg/dL 6.76 1.95  Sodium 135 - 145 mEq/L 143 138 140  Potassium 3.5 - 5.1 mEq/L 3.7 4.2 4.1  Chloride 96 - 112 mEq/L 104 102 105  CO2 19 - 32 mEq/L 28 25 27   Calcium 8.4 - 10.5 mg/dL 0.93 9.8 9.1    . Current antihypertensive regimen:  ? Atenolol 25 mg daily   . How often are you checking your Blood Pressure?    2-3 times per month  . Current home BP readings:   Patient states her BP is within normal range when she  checks it, she does not record her readings.  . What recent interventions/DTPs have been made by any provider to improve Blood Pressure control since last CPP Visit:   None noted  . Any recent hospitalizations or ED visits since last visit with CPP? No  .  . What diet changes have been made to improve Blood Pressure Control?  o Patient states she does not follow any special diets.  . What exercise is being done to improve your Blood Pressure Control?  o Patient states she is disabled and does not exercise.  Adherence Review: Is the patient currently on ACE/ARB medication? Yes Does the patient have >5 day gap between last estimated fill dates? No Atorvastatin - last fill 08/01/20 30D Metformin - last fill 08/01/20 30D             Atenolol 25 mg daily - last fill 07/31/20 30D   Star Rating Drugs: ? Atorvastatin - last fill 08/01/20 30D ? Metformin - last fill 08/01/20 30D   10/01/20, RMA Clinical Pharmacists Assistant [phone]  Time Spent: 30

## 2020-08-07 ENCOUNTER — Other Ambulatory Visit: Payer: Self-pay | Admitting: Internal Medicine

## 2020-08-07 DIAGNOSIS — A609 Anogenital herpesviral infection, unspecified: Secondary | ICD-10-CM

## 2020-09-04 ENCOUNTER — Telehealth: Payer: Self-pay | Admitting: Internal Medicine

## 2020-09-04 NOTE — Telephone Encounter (Signed)
[employer] is requesting a PA for hyoscyamine (LEVSIN SL) 0.125 MG SL tablet . Please advise    Phone: [phone] Ref # [id number]

## 2020-09-21 DIAGNOSIS — G43719 Chronic migraine without aura, intractable, without status migrainosus: Secondary | ICD-10-CM | POA: Diagnosis not present

## 2020-09-27 NOTE — Telephone Encounter (Signed)
Rep unable to find member with current insurance.  Will need to verify if insurance current in [facility].

## 2020-09-27 NOTE — Telephone Encounter (Signed)
PA initiated over phone at number transferred to. Key Code: [id number]. Determination will be faxed to office.

## 2020-09-29 ENCOUNTER — Other Ambulatory Visit: Payer: Self-pay | Admitting: Internal Medicine

## 2020-10-02 ENCOUNTER — Encounter: Payer: Self-pay | Admitting: Internal Medicine

## 2020-10-06 ENCOUNTER — Other Ambulatory Visit: Payer: Self-pay | Admitting: Internal Medicine

## 2020-10-06 DIAGNOSIS — G8929 Other chronic pain: Secondary | ICD-10-CM

## 2020-10-06 MED ORDER — BACLOFEN 10 MG PO TABS: ORAL_TABLET | ORAL | 0 refills | Status: DC

## 2020-10-06 MED ORDER — DICLOFENAC SODIUM 75 MG PO TBEC: 75.0000 mg | DELAYED_RELEASE_TABLET | Freq: Two times a day (BID) | ORAL | 0 refills | Status: DC

## 2020-10-11 ENCOUNTER — Telehealth: Payer: Self-pay | Admitting: Pharmacist

## 2020-10-11 NOTE — Progress Notes (Signed)
Chronic Care Management Pharmacy Assistant   Name: NATORI GUDINO  MRN: [id number] DOB: [birth year]   Reason for Encounter: Disease State   Conditions to be addressed/monitored: HTN   Recent office visits:  None ID  Recent consult visits:  None ID  Hospital visits:  None in previous 6 months  Medications: Outpatient Encounter Medications as of 10/11/2020  Medication Sig   atenolol (TENORMIN) 25 MG tablet TAKE 1 TABLET BY MOUTH EVERY DAY   atorvastatin (LIPITOR) 10 MG tablet TAKE 1 TABLET BY MOUTH EVERY DAY   b complex vitamins tablet Take 1 tablet by mouth daily.   baclofen (LIORESAL) 10 MG tablet TAKE 1 TABLET BY MOUTH THREE TIMES A DAY AS NEEDED FOR MUSCLE SPASM   botulinum toxin Type A (BOTOX) 100 UNITS SOLR injection Inject 100 Units into the muscle every 3 (three) months. Every 12 weeks at [facility]   cetirizine (ZYRTEC) 10 MG tablet Take 10 mg by mouth daily. Reported on 07/13/2015   diclofenac (VOLTAREN) 75 MG EC tablet Take 1 tablet (75 mg total) by mouth 2 (two) times daily. Will need a follow up appointment for additional refills   DULoxetine (CYMBALTA) 30 MG capsule Take 1 capsule (30 mg total) by mouth daily. Take daily in addition to 60 mg daily for 90 mg daily   DULoxetine (CYMBALTA) 60 MG capsule TAKE 1 CAPSULE BY MOUTH EVERY DAY. WILL NEED A FOLLOW UP APPOINTMENT FOR ADDITIONAL REFILLS   fenofibrate (TRICOR) 145 MG tablet TAKE 1 TABLET (145MG  TOTAL) BY MOUTH EVERY DAY. PLEASE SCHEDULE FOLLOW UP FOR FUTURE REFILLS   hyoscyamine (LEVSIN SL) 0.125 MG SL tablet Place 1 tablet (0.125 mg total) under the tongue every 6 (six) hours as needed.   hyoscyamine (LEVSIN SL) 0.125 MG SL tablet PLACE 1 TABLET (0.125 MG TOTAL) UNDER THE TONGUE EVERY 6 (SIX) HOURS AS NEEDED.   MELATONIN PO Take 6 mg by mouth daily.   metFORMIN (GLUCOPHAGE) 1000 MG tablet TAKE 1 TABLET BY MOUTH TWICE A DAY WITH MEALS   mirtazapine (REMERON) 45 MG tablet TAKE 1 TABLET BY MOUTH EVERYDAY  AT BEDTIME   Multiple Vitamin (MULTIVITAMIN) tablet Take 1 tablet by mouth daily.     nitrofurantoin, macrocrystal-monohydrate, (MACROBID) 100 MG capsule TAKE 1 CAPSULE BY MOUTH ONCE FOR 1 DOSE. WITH INTERCOURSE   norethindrone-ethinyl estradiol (JUNEL 1/20) 1-20 MG-MCG tablet Take 1 tablet by mouth daily.   omeprazole (PRILOSEC) 40 MG capsule TAKE 1 CAPSULE BY MOUTH EVERY DAY   pregabalin (LYRICA) 150 MG capsule TAKE 1 CAPSULE BY MOUTH TWICE A DAY   Probiotic Product (PROBIOTIC-10 PO) Take by mouth.   promethazine (PHENERGAN) 12.5 MG tablet Take by mouth.   tiZANidine (ZANAFLEX) 4 MG tablet TAKE 1 TABLET BY MOUTH EVERY 8 HOURS AS NEEDED FOR MUSCLE SPASM   valACYclovir (VALTREX) 1000 MG tablet TAKE 1 TABLET DAILY. INCREASE TO TWICE A DAY WITH OUTBREAKS   No facility-administered encounter medications on file as of 10/11/2020.    Pharmacist Review  Reviewed chart prior to disease state call. Spoke with patient regarding BP  Recent Office Vitals: BP Readings from Last 3 Encounters:  05/24/19 114/74  11/27/18 120/78  11/20/18 102/64   Pulse Readings from Last 3 Encounters:  05/24/19 94  11/20/18 92  12/23/17 86    Wt Readings from Last 3 Encounters:  05/24/19 196 lb (88.9 kg)  11/27/18 196 lb (88.9 kg)  11/20/18 190 lb (86.2 kg)     Kidney Function Lab Results  Component Value Date/Time   CREATININE 0.77 05/24/2019 04:21 PM   CREATININE 0.93 12/23/2017 04:05 PM   GFR 83.29 05/24/2019 04:21 PM   GFRNONAA >90 04/23/2011 03:36 AM   GFRAA >90 04/23/2011 03:36 AM    BMP Latest Ref Rng & Units 05/24/2019 12/23/2017 09/29/2017  Glucose 70 - 99 mg/dL 89 84 88  BUN 6 - 23 mg/dL 11 12 11   Creatinine 0.40 - 1.20 mg/dL 7.32 2.02  Sodium 135 - 145 mEq/L 143 138 140  Potassium 3.5 - 5.1 mEq/L 3.7 4.2 4.1  Chloride 96 - 112 mEq/L 104 102 105  CO2 19 - 32 mEq/L 28 25 27   Calcium 8.4 - 10.5 mg/dL 5.42 9.8 9.1    Current antihypertensive regimen:  Atenolol 25 mg daily  Patient  states that she does not take this medication for blood pressure  How often are you checking your Blood Pressure?  Patient states that she does not check blood pressure all the time because it's usually normal  Current home BP readings: Patient states blood pressure range in the 120s. States that she has not problems with blood pressure   What recent interventions/DTPs have been made by any provider to improve Blood Pressure control since last CPP Visit: None noted  Any recent hospitalizations or ED visits since last visit with CPP? No  What diet changes have been made to improve Blood Pressure Control?  Patient states that she has made no changes to diet  What exercise is being done to improve your Blood Pressure Control?  Patient states she is not exercising  Adherence Review: Is the patient currently on ACE/ARB medication? No Does the patient have >5 day gap between last estimated fill dates? No  Atenolol 25 mg daily   Star Rating Drugs: Atorvastatin  10 mg 09/29/20 30 ds Metformin 1000 mg 10/06/20 30 ds  11/30/20 Clinical Pharmacist Assistant [phone]   Time spent:20

## 2020-10-16 ENCOUNTER — Telehealth: Payer: Self-pay

## 2020-10-16 ENCOUNTER — Encounter: Payer: Self-pay | Admitting: Internal Medicine

## 2020-10-16 NOTE — Telephone Encounter (Signed)
LVM instructing pt to call back to give more details.

## 2020-10-17 ENCOUNTER — Encounter (HOSPITAL_BASED_OUTPATIENT_CLINIC_OR_DEPARTMENT_OTHER): Payer: Self-pay | Admitting: *Deleted

## 2020-10-17 ENCOUNTER — Emergency Department (HOSPITAL_BASED_OUTPATIENT_CLINIC_OR_DEPARTMENT_OTHER): Payer: Medicare Other

## 2020-10-17 ENCOUNTER — Inpatient Hospital Stay (HOSPITAL_BASED_OUTPATIENT_CLINIC_OR_DEPARTMENT_OTHER)
Admission: EM | Admit: 2020-10-17 | Discharge: 2020-10-19 | DRG: 684 | Disposition: A | Discharge status: Home / Self Care | Payer: Medicare Other | Attending: Internal Medicine | Admitting: Internal Medicine

## 2020-10-17 ENCOUNTER — Other Ambulatory Visit: Payer: Self-pay

## 2020-10-17 ENCOUNTER — Telehealth (INDEPENDENT_AMBULATORY_CARE_PROVIDER_SITE_OTHER): Payer: Medicare Other | Admitting: Family Medicine

## 2020-10-17 ENCOUNTER — Encounter: Payer: Self-pay | Admitting: Family Medicine

## 2020-10-17 DIAGNOSIS — K219 Gastro-esophageal reflux disease without esophagitis: Secondary | ICD-10-CM | POA: Diagnosis not present

## 2020-10-17 DIAGNOSIS — Z800 Family history of malignant neoplasm of digestive organs: Secondary | ICD-10-CM | POA: Diagnosis not present

## 2020-10-17 DIAGNOSIS — D509 Iron deficiency anemia, unspecified: Secondary | ICD-10-CM | POA: Diagnosis present

## 2020-10-17 DIAGNOSIS — Z8249 Family history of ischemic heart disease and other diseases of the circulatory system: Secondary | ICD-10-CM

## 2020-10-17 DIAGNOSIS — Z803 Family history of malignant neoplasm of breast: Secondary | ICD-10-CM

## 2020-10-17 DIAGNOSIS — G8929 Other chronic pain: Secondary | ICD-10-CM | POA: Diagnosis not present

## 2020-10-17 DIAGNOSIS — R109 Unspecified abdominal pain: Secondary | ICD-10-CM

## 2020-10-17 DIAGNOSIS — F419 Anxiety disorder, unspecified: Secondary | ICD-10-CM | POA: Diagnosis present

## 2020-10-17 DIAGNOSIS — Z818 Family history of other mental and behavioral disorders: Secondary | ICD-10-CM

## 2020-10-17 DIAGNOSIS — R269 Unspecified abnormalities of gait and mobility: Secondary | ICD-10-CM

## 2020-10-17 DIAGNOSIS — N179 Acute kidney failure, unspecified: Secondary | ICD-10-CM | POA: Diagnosis not present

## 2020-10-17 DIAGNOSIS — R4189 Other symptoms and signs involving cognitive functions and awareness: Secondary | ICD-10-CM

## 2020-10-17 DIAGNOSIS — E282 Polycystic ovarian syndrome: Secondary | ICD-10-CM | POA: Diagnosis present

## 2020-10-17 DIAGNOSIS — I498 Other specified cardiac arrhythmias: Secondary | ICD-10-CM | POA: Diagnosis not present

## 2020-10-17 DIAGNOSIS — R253 Fasciculation: Secondary | ICD-10-CM

## 2020-10-17 DIAGNOSIS — Z79899 Other long term (current) drug therapy: Secondary | ICD-10-CM | POA: Diagnosis not present

## 2020-10-17 DIAGNOSIS — K8020 Calculus of gallbladder without cholecystitis without obstruction: Secondary | ICD-10-CM | POA: Diagnosis not present

## 2020-10-17 DIAGNOSIS — Z7984 Long term (current) use of oral hypoglycemic drugs: Secondary | ICD-10-CM

## 2020-10-17 DIAGNOSIS — F17210 Nicotine dependence, cigarettes, uncomplicated: Secondary | ICD-10-CM | POA: Diagnosis not present

## 2020-10-17 DIAGNOSIS — R509 Fever, unspecified: Secondary | ICD-10-CM | POA: Diagnosis not present

## 2020-10-17 DIAGNOSIS — Z91014 Allergy to mammalian meats: Secondary | ICD-10-CM

## 2020-10-17 DIAGNOSIS — R6510 Systemic inflammatory response syndrome (SIRS) of non-infectious origin without acute organ dysfunction: Secondary | ICD-10-CM | POA: Insufficient documentation

## 2020-10-17 DIAGNOSIS — D649 Anemia, unspecified: Secondary | ICD-10-CM | POA: Diagnosis present

## 2020-10-17 DIAGNOSIS — R112 Nausea with vomiting, unspecified: Secondary | ICD-10-CM

## 2020-10-17 DIAGNOSIS — Z801 Family history of malignant neoplasm of trachea, bronchus and lung: Secondary | ICD-10-CM

## 2020-10-17 DIAGNOSIS — K760 Fatty (change of) liver, not elsewhere classified: Secondary | ICD-10-CM | POA: Diagnosis not present

## 2020-10-17 DIAGNOSIS — Z20822 Contact with and (suspected) exposure to covid-19: Secondary | ICD-10-CM | POA: Diagnosis present

## 2020-10-17 DIAGNOSIS — Z91011 Allergy to milk products: Secondary | ICD-10-CM | POA: Diagnosis not present

## 2020-10-17 DIAGNOSIS — M549 Dorsalgia, unspecified: Secondary | ICD-10-CM | POA: Diagnosis present

## 2020-10-17 DIAGNOSIS — A419 Sepsis, unspecified organism: Secondary | ICD-10-CM | POA: Diagnosis not present

## 2020-10-17 DIAGNOSIS — E785 Hyperlipidemia, unspecified: Secondary | ICD-10-CM | POA: Diagnosis present

## 2020-10-17 DIAGNOSIS — Z823 Family history of stroke: Secondary | ICD-10-CM | POA: Diagnosis not present

## 2020-10-17 DIAGNOSIS — R197 Diarrhea, unspecified: Secondary | ICD-10-CM

## 2020-10-17 DIAGNOSIS — Z981 Arthrodesis status: Secondary | ICD-10-CM | POA: Diagnosis not present

## 2020-10-17 DIAGNOSIS — G894 Chronic pain syndrome: Secondary | ICD-10-CM | POA: Diagnosis present

## 2020-10-17 DIAGNOSIS — I10 Essential (primary) hypertension: Secondary | ICD-10-CM | POA: Diagnosis not present

## 2020-10-17 DIAGNOSIS — E119 Type 2 diabetes mellitus without complications: Secondary | ICD-10-CM | POA: Diagnosis present

## 2020-10-17 DIAGNOSIS — T39395A Adverse effect of other nonsteroidal anti-inflammatory drugs [NSAID], initial encounter: Secondary | ICD-10-CM | POA: Diagnosis present

## 2020-10-17 LAB — RESP PANEL BY RT-PCR (FLU A&B, COVID) ARPGX2
Influenza A by PCR: NEGATIVE
Influenza B by PCR: NEGATIVE
SARS Coronavirus 2 by RT PCR: NEGATIVE

## 2020-10-17 LAB — COMPREHENSIVE METABOLIC PANEL
ALT: 20 U/L (ref 0–44)
AST: 20 U/L (ref 15–41)
Albumin: 4.1 g/dL (ref 3.5–5.0)
Alkaline Phosphatase: 28 U/L — ABNORMAL LOW (ref 38–126)
Anion gap: 9 (ref 5–15)
BUN: 26 mg/dL — ABNORMAL HIGH (ref 6–20)
CO2: 23 mmol/L (ref 22–32)
Calcium: 9.5 mg/dL (ref 8.9–10.3)
Chloride: 104 mmol/L (ref 98–111)
Creatinine, Ser: 3.77 mg/dL — ABNORMAL HIGH (ref 0.44–1.00)
GFR, Estimated: 15 mL/min — ABNORMAL LOW (ref >60)
Glucose, Bld: 87 mg/dL (ref 70–99)
Potassium: 4.7 mmol/L (ref 3.5–5.1)
Sodium: 136 mmol/L (ref 135–145)
Total Bilirubin: 0.3 mg/dL (ref 0.3–1.2)
Total Protein: 6.7 g/dL (ref 6.5–8.1)

## 2020-10-17 LAB — CBC WITH DIFFERENTIAL/PLATELET
Abs Immature Granulocytes: 0.03 10*3/uL (ref 0.00–0.07)
Basophils Absolute: 0.1 10*3/uL (ref 0.0–0.1)
Basophils Relative: 1 %
Eosinophils Absolute: 0.1 10*3/uL (ref 0.0–0.5)
Eosinophils Relative: 2 %
HCT: 28.4 % — ABNORMAL LOW (ref 36.0–46.0)
Hemoglobin: 9.1 g/dL — ABNORMAL LOW (ref 12.0–15.0)
Immature Granulocytes: 0 %
Lymphocytes Relative: 27 %
Lymphs Abs: 2.4 10*3/uL (ref 0.7–4.0)
MCH: 25 pg — ABNORMAL LOW (ref 26.0–34.0)
MCHC: 32 g/dL (ref 30.0–36.0)
MCV: 78 fL — ABNORMAL LOW (ref 80.0–100.0)
Monocytes Absolute: 0.6 10*3/uL (ref 0.1–1.0)
Monocytes Relative: 7 %
Neutro Abs: 5.6 10*3/uL (ref 1.7–7.7)
Neutrophils Relative %: 63 %
Platelets: 271 10*3/uL (ref 150–400)
RBC: 3.64 MIL/uL — ABNORMAL LOW (ref 3.87–5.11)
RDW: 14.6 % (ref 11.5–15.5)
WBC: 8.8 10*3/uL (ref 4.0–10.5)
nRBC: 0 % (ref 0.0–0.2)

## 2020-10-17 LAB — URINALYSIS, ROUTINE W REFLEX MICROSCOPIC
Bilirubin Urine: NEGATIVE
Glucose, UA: NEGATIVE mg/dL
Hgb urine dipstick: NEGATIVE
Ketones, ur: NEGATIVE mg/dL
Leukocytes,Ua: NEGATIVE
Nitrite: NEGATIVE
Specific Gravity, Urine: 1.011 (ref 1.005–1.030)
pH: 5.5 (ref 5.0–8.0)

## 2020-10-17 LAB — LACTIC ACID, PLASMA
Lactic Acid, Venous: 1.1 mmol/L (ref 0.5–1.9)
Lactic Acid, Venous: 0.7 mmol/L (ref 0.5–1.9)

## 2020-10-17 LAB — PREGNANCY, URINE: Preg Test, Ur: NEGATIVE

## 2020-10-17 LAB — PROTIME-INR
INR: 0.9 (ref 0.8–1.2)
Prothrombin Time: 12.1 seconds (ref 11.4–15.2)

## 2020-10-17 LAB — APTT: aPTT: 30 seconds (ref 24–36)

## 2020-10-17 MED ORDER — ADULT MULTIVITAMIN W/MINERALS CH
1.0000 | ORAL_TABLET | Freq: Every day | ORAL | Status: DC
  Filled 2020-10-17: qty 1

## 2020-10-17 MED ORDER — SODIUM CHLORIDE 0.9 % IV BOLUS
1000.0000 mL | Freq: Once | INTRAVENOUS | Status: AC
  Administered 2020-10-17: 1000 mL via INTRAVENOUS

## 2020-10-17 MED ORDER — MIRTAZAPINE 15 MG PO TABS
45.0000 mg | ORAL_TABLET | Freq: Every day | ORAL | Status: DC
  Administered 2020-10-18 (×2): 45 mg via ORAL
  Filled 2020-10-17 (×2): qty 3

## 2020-10-17 MED ORDER — ONDANSETRON HCL 4 MG PO TABS: 4.0000 mg | ORAL_TABLET | Freq: Four times a day (QID) | ORAL | Status: DC | PRN

## 2020-10-17 MED ORDER — DULOXETINE HCL 60 MG PO CPEP
60.0000 mg | ORAL_CAPSULE | Freq: Every day | ORAL | Status: DC
  Administered 2020-10-18 – 2020-10-19 (×2): 60 mg via ORAL
  Filled 2020-10-17 (×2): qty 1

## 2020-10-17 MED ORDER — IBUPROFEN 400 MG PO TABS: 600.0000 mg | ORAL_TABLET | Freq: Once | ORAL | Status: DC

## 2020-10-17 MED ORDER — FENOFIBRATE 160 MG PO TABS
160.0000 mg | ORAL_TABLET | Freq: Every day | ORAL | Status: DC
  Administered 2020-10-18 – 2020-10-19 (×2): 160 mg via ORAL
  Filled 2020-10-17 (×2): qty 1

## 2020-10-17 MED ORDER — DULOXETINE HCL 30 MG PO CPEP
30.0000 mg | ORAL_CAPSULE | Freq: Every day | ORAL | Status: DC
  Filled 2020-10-17 (×2): qty 1

## 2020-10-17 MED ORDER — PANTOPRAZOLE SODIUM 40 MG PO TBEC
80.0000 mg | DELAYED_RELEASE_TABLET | Freq: Every day | ORAL | Status: DC
  Administered 2020-10-18 – 2020-10-19 (×2): 80 mg via ORAL
  Filled 2020-10-17 (×2): qty 2

## 2020-10-17 MED ORDER — ONDANSETRON HCL 4 MG/2ML IJ SOLN: 4.0000 mg | Freq: Four times a day (QID) | INTRAMUSCULAR | Status: DC | PRN

## 2020-10-17 MED ORDER — LACTATED RINGERS IV BOLUS (SEPSIS)
1000.0000 mL | Freq: Once | INTRAVENOUS | Status: AC
  Administered 2020-10-17: 1000 mL via INTRAVENOUS

## 2020-10-17 MED ORDER — ACETAMINOPHEN 325 MG PO TABS: 650.0000 mg | ORAL_TABLET | Freq: Four times a day (QID) | ORAL | Status: DC | PRN

## 2020-10-17 MED ORDER — ATORVASTATIN CALCIUM 10 MG PO TABS
10.0000 mg | ORAL_TABLET | Freq: Every day | ORAL | Status: DC
  Administered 2020-10-18 – 2020-10-19 (×2): 10 mg via ORAL
  Filled 2020-10-17 (×2): qty 1

## 2020-10-17 MED ORDER — SODIUM CHLORIDE 0.9 % IV SOLN
Freq: Once | INTRAVENOUS | Status: AC
Start: 2020-10-17 — End: 2020-10-18

## 2020-10-17 MED ORDER — PREGABALIN 75 MG PO CAPS
150.0000 mg | ORAL_CAPSULE | Freq: Two times a day (BID) | ORAL | Status: DC
  Administered 2020-10-18 – 2020-10-19 (×4): 150 mg via ORAL
  Filled 2020-10-17 (×4): qty 2

## 2020-10-17 MED ORDER — ACETAMINOPHEN 650 MG RE SUPP: 650.0000 mg | Freq: Four times a day (QID) | RECTAL | Status: DC | PRN

## 2020-10-17 MED ORDER — TIZANIDINE HCL 4 MG PO TABS
4.0000 mg | ORAL_TABLET | Freq: Three times a day (TID) | ORAL | Status: DC | PRN
  Administered 2020-10-18 – 2020-10-19 (×2): 4 mg via ORAL
  Filled 2020-10-17 (×2): qty 1

## 2020-10-17 MED ORDER — BACLOFEN 10 MG PO TABS: 10.0000 mg | ORAL_TABLET | Freq: Three times a day (TID) | ORAL | Status: DC | PRN

## 2020-10-17 MED ORDER — ATENOLOL 25 MG PO TABS
25.0000 mg | ORAL_TABLET | Freq: Every day | ORAL | Status: DC
  Administered 2020-10-18 – 2020-10-19 (×2): 25 mg via ORAL
  Filled 2020-10-17 (×2): qty 1

## 2020-10-17 NOTE — ED Provider Notes (Signed)
[facility] EMERGENCY DEPT Provider Note   CSN: [id number] Arrival date & time: 10/17/20  1432     History Chief Complaint  Patient presents with   Flank Pain    Kayla Floyd is a 40 y.o. female.  Nausea, vomiting, diarrhea, flank pain for the last several days.  The history is provided by the patient.  Flank Pain This is a new problem. The current episode started more than 2 days ago. The problem has been gradually improving. Pertinent negatives include no chest pain, no abdominal pain, no headaches and no shortness of breath. Nothing aggravates the symptoms. Nothing relieves the symptoms. Treatments tried: toradol. The treatment provided mild relief.      Past Medical History:  Diagnosis Date   ANXIETY    BACK PAIN, CHRONIC    DYSAUTONOMIA 2011   "POTS" , recurrent syncope - follows with cards for same   GERD    HSV (herpes simplex virus) anogenital infection 06/2015   cervix swab   HYPERLIPIDEMIA    Kidney failure    MIGRAINE HEADACHE    Palpitations    POLYCYSTIC OVARIAN DISEASE    POTS (postural orthostatic tachycardia syndrome)    Tear meniscus knee 06/14/2010   right    Patient Active Problem List   Diagnosis Date Noted   Myalgia 10/20/2018   Diarrhea 10/20/2018   Dysphagia 06/27/2018   Acute cystitis without hematuria 09/23/2017   Abdominal pain 09/23/2017   Gastroparesis 10/28/2016   Prediabetes 08/31/2016   Genetic testing 08/29/2016   Depression 08/28/2016   Family history of colon cancer    Family history of breast cancer    Obese 03/15/2015   Chronic migraine without aura without status migrainosus, not intractable 06/20/2014   Tear meniscus knee 06/14/2010   Dysautonomia - POTS 03/20/2010   Anxiety 07/30/2009   Essential hypertension 07/28/2009   SYNCOPE AND COLLAPSE 07/10/2009   PALPITATIONS 07/10/2009   POLYCYSTIC OVARIAN DISEASE 08/31/2008   Hypercholesterolemia with hypertriglyceridemia 08/31/2008   GERD 08/31/2008    Chronic back pain 08/31/2008    Past Surgical History:  Procedure Laterality Date   Cervical spine  2010   ACDF c5-6   crevical fusion   2010   evs     LUMBAR LAMINECTOMY  2008   TONSILLECTOMY AND ADENOIDECTOMY       OB History     Gravida  0   Para  0   Term  0   Preterm  0   AB  0   Living  0      SAB  0   IAB  0   Ectopic  0   Multiple  0   Live Births              Family History  Problem Relation Age of Onset   Bipolar disorder Mother    Stroke Mother        Age of 41 while on OCP   Other Father        MVA   Heart disease Father    Heart attack Father    Colon cancer Maternal Grandmother 75   Cancer Maternal Grandmother    Hypertension Sister    Hypertension Sister    Migraines Sister    Breast cancer Paternal Grandmother        dx in her 36s-40s   Cancer Paternal Uncle        ocular   Breast cancer Paternal Aunt 49   Lung cancer Paternal Uncle  Social History   Tobacco Use   Smoking status: Former    Packs/day: 1.00    Years: 10.00    Pack years: 10.00    Types: Cigarettes   Smokeless tobacco: Former   Tobacco comments:    1-2 cigarettes per day  Vaping Use   Vaping Use: Never used  Substance Use Topics   Alcohol use: Yes    Alcohol/week: 0.0 standard drinks    Comment: occasionally   Drug use: No    Home Medications Prior to Admission medications   Medication Sig Start Date End Date Taking? Authorizing Provider  atenolol (TENORMIN) 25 MG tablet TAKE 1 TABLET BY MOUTH EVERY DAY 10/02/20  Yes Burns, Bobette Mo, MD  atorvastatin (LIPITOR) 10 MG tablet TAKE 1 TABLET BY MOUTH EVERY DAY 10/06/20  Yes Burns, Bobette Mo, MD  b complex vitamins tablet Take 1 tablet by mouth daily.   Yes [provider]  baclofen (LIORESAL) 10 MG tablet TAKE 1 TABLET BY MOUTH THREE TIMES A DAY AS NEEDED FOR MUSCLE SPASM 10/06/20  Yes Etta Grandchild, MD  cetirizine (ZYRTEC) 10 MG tablet Take 10 mg by mouth daily. Reported on 07/13/2015   Yes  [provider]  diclofenac (VOLTAREN) 75 MG EC tablet Take 1 tablet (75 mg total) by mouth 2 (two) times daily. Will need a follow up appointment for additional refills 10/06/20  Yes Etta Grandchild, MD  DULoxetine (CYMBALTA) 30 MG capsule Take 1 capsule (30 mg total) by mouth daily. Take daily in addition to 60 mg daily for 90 mg daily 05/31/20  Yes Burns, Bobette Mo, MD  DULoxetine (CYMBALTA) 60 MG capsule TAKE 1 CAPSULE BY MOUTH EVERY DAY. WILL NEED A FOLLOW UP APPOINTMENT FOR ADDITIONAL REFILLS 10/06/20  Yes Burns, Bobette Mo, MD  fenofibrate (TRICOR) 145 MG tablet TAKE 1 TABLET (  TOTAL) BY MOUTH EVERY DAY. PLEASE SCHEDULE FOLLOW UP FOR FUTURE REFILLS 10/02/20  Yes Burns, Bobette Mo, MD  hyoscyamine (LEVSIN SL) 0.125 MG SL tablet Place 1 tablet (0.125 mg total) under the tongue every 6 (six) hours as needed. 04/10/20  Yes Danis, Andreas Blower, MD  MELATONIN PO Take 10 mg by mouth daily.   Yes [provider]  metFORMIN (GLUCOPHAGE) 1000 MG tablet TAKE 1 TABLET BY MOUTH TWICE A DAY WITH MEALS 10/06/20  Yes Burns, Bobette Mo, MD  mirtazapine (REMERON) 45 MG tablet TAKE 1 TABLET BY MOUTH EVERYDAY AT BEDTIME 10/06/20  Yes Burns, Bobette Mo, MD  Multiple Vitamin (MULTIVITAMIN) tablet Take 1 tablet by mouth daily.     Yes [provider]  omeprazole (PRILOSEC) 40 MG capsule TAKE 1 CAPSULE BY MOUTH EVERY DAY 10/06/20  Yes Burns, Bobette Mo, MD  pregabalin (LYRICA) 150 MG capsule TAKE 1 CAPSULE BY MOUTH TWICE A DAY 10/02/20  Yes Burns, Bobette Mo, MD  Probiotic Product (PROBIOTIC-10 PO) Take by mouth.   Yes [provider]  tiZANidine (ZANAFLEX) 4 MG tablet TAKE 1 TABLET BY MOUTH EVERY 8 HOURS AS NEEDED FOR MUSCLE SPASM 08/07/20  Yes Burns, Bobette Mo, MD  valACYclovir (VALTREX) 1000 MG tablet TAKE 1 TABLET DAILY. INCREASE TO TWICE A DAY WITH OUTBREAKS 08/07/20  Yes Burns, Bobette Mo, MD  botulinum toxin Type A (BOTOX) 100 units SOLR injection Inject 100 Units into the muscle every 3 (three) months.  Every 12 weeks at [facility] 03/15/15   Newt Lukes, MD  nitrofurantoin, macrocrystal-monohydrate, (MACROBID) 100 MG capsule TAKE 1 CAPSULE BY MOUTH ONCE FOR 1  DOSE. WITH INTERCOURSE 09/23/19   Pincus SanesBurns, Stacy J, MD  norethindrone-ethinyl estradiol (JUNEL 1/20) 1-20 MG-MCG tablet Take 1 tablet by mouth daily. 11/27/18   Fontaine, Nadyne Coombesimothy P, MD  promethazine (PHENERGAN) 12.5 MG tablet Take by mouth.    [provider]    Allergies    Alpha-gal; Beef-derived products; Glycerol, iodinated; Milk-related compounds; and Pork-derived products  Review of Systems   Review of Systems  Constitutional:  Negative for chills and fever.  HENT:  Negative for ear pain and sore throat.   Eyes:  Negative for pain and visual disturbance.  Respiratory:  Negative for cough and shortness of breath.   Cardiovascular:  Negative for chest pain and palpitations.  Gastrointestinal:  Positive for diarrhea, nausea and vomiting. Negative for abdominal pain.  Genitourinary:  Positive for flank pain. Negative for dysuria and hematuria.  Musculoskeletal:  Negative for arthralgias and back pain.  Skin:  Negative for color change and rash.  Neurological:  Negative for seizures, syncope and headaches.  All other systems reviewed and are negative.  Physical Exam Updated Vital Signs BP 115/75   Pulse 91   Temp (!) 100.4 F (38 C) (Oral)   Resp (!) 26   Ht 5\' 5"  (1.651 m)   Wt 88.5 kg   LMP 09/25/2020   SpO2 100%   BMI 32.45 kg/m   Physical Exam Vitals and nursing note reviewed.  Constitutional:      General: She is not in acute distress.    Appearance: She is well-developed. She is not ill-appearing.  HENT:     Head: Normocephalic and atraumatic.     Nose: Nose normal.     Mouth/Throat:     Mouth: Mucous membranes are dry.  Eyes:     Extraocular Movements: Extraocular movements intact.     Conjunctiva/sclera: Conjunctivae normal.     Pupils: Pupils are equal, round, and reactive to light.   Cardiovascular:     Rate and Rhythm: Normal rate and regular rhythm.     Pulses: Normal pulses.     Heart sounds: Normal heart sounds. No murmur heard. Pulmonary:     Effort: Pulmonary effort is normal. No respiratory distress.     Breath sounds: Normal breath sounds.  Abdominal:     General: Abdomen is flat.     Palpations: Abdomen is soft.     Tenderness: There is no abdominal tenderness.  Musculoskeletal:     Cervical back: Normal range of motion and neck supple.  Skin:    General: Skin is warm and dry.     Capillary Refill: Capillary refill takes less than 2 seconds.  Neurological:     General: No focal deficit present.     Mental Status: She is alert and oriented to person, place, and time.     Cranial Nerves: No cranial nerve deficit.     Sensory: No sensory deficit.     Motor: No weakness.     Coordination: Coordination normal.    ED Results / Procedures / Treatments   Labs (all labs ordered are listed, but only abnormal results are displayed) Labs Reviewed  COMPREHENSIVE METABOLIC PANEL - Abnormal; Notable for the following components:      Result Value   BUN 26 (*)    Creatinine, Ser 3.77 (*)    Alkaline Phosphatase 28 (*)    GFR, Estimated 15 (*)    All other components within normal limits  CBC WITH DIFFERENTIAL/PLATELET - Abnormal; Notable for the following components:  RBC 3.64 (*)    Hemoglobin 9.1 (*)    HCT 28.4 (*)    MCV 78.0 (*)    MCH 25.0 (*)    All other components within normal limits  URINALYSIS, ROUTINE W REFLEX MICROSCOPIC - Abnormal; Notable for the following components:   Protein, ur TRACE (*)    All other components within normal limits  RESP PANEL BY RT-PCR (FLU A&B, COVID) ARPGX2  CULTURE, BLOOD (SINGLE)  URINE CULTURE  GASTROINTESTINAL PANEL BY PCR, STOOL (REPLACES STOOL CULTURE)  C DIFFICILE QUICK SCREEN W PCR REFLEX    LACTIC ACID, PLASMA  PREGNANCY, URINE  LACTIC ACID, PLASMA  PROTIME-INR  APTT    EKG EKG  Interpretation  Date/Time:  Tuesday October 17 2020 15:29:01 EDT Ventricular Rate:  96 PR Interval:  137 QRS Duration: 85 QT Interval:  337 QTC Calculation: 426 R Axis:   37 Text Interpretation: Sinus rhythm Low voltage, precordial leads Borderline T abnormalities, anterior leads Confirmed by Virgina Norfolk ([id number]) on 10/17/2020 3:44:43 PM  Radiology DG Chest Port 1 View  Result Date: 10/17/2020 CLINICAL DATA:  Sepsis. EXAM: PORTABLE CHEST 1 VIEW COMPARISON:  February 06, 2009. FINDINGS: The heart size and mediastinal contours are within normal limits. Both lungs are clear. The visualized skeletal structures are unremarkable. IMPRESSION: No active disease. Electronically Signed   By: Lupita Raider M.D.   On: 10/17/2020 15:25   CT Renal Stone Study  Result Date: 10/17/2020 CLINICAL DATA:  Pain; ER nurse note states bilateral flank pain EXAM: CT ABDOMEN AND PELVIS WITHOUT CONTRAST TECHNIQUE: Multidetector CT imaging of the abdomen and pelvis was performed following the standard protocol without IV contrast. COMPARISON:  None. FINDINGS: Lower chest: No acute abnormality. Hepatobiliary: Decreased hepatic attenuation reflecting steatosis. There is a 2 cm rim calcified gallstone. No biliary dilatation Pancreas: Unremarkable. Spleen: Unremarkable. Adrenals/Urinary Tract: Adrenals are unremarkable. Kidneys and bladder are unremarkable. Stomach/Bowel: Stomach is within normal limits. Bowel is normal in caliber. Normal appendix. Vascular/Lymphatic: Minimal aortic atherosclerosis. No enlarged lymph nodes. Reproductive: Uterus is unremarkable. There is a 5 cm low-density right adnexal lesion. Other: No free fluid.  Abdominal wall is unremarkable. Musculoskeletal: Lower lumbar degenerative changes. IMPRESSION: No urinary tract calculi. Cholelithiasis. Hepatic steatosis. 5 cm fluid density right adnexal lesion. No follow-up imaging recommended. Note: This recommendation does not apply to premenarchal patients and to  those with increased risk (genetic, family history, elevated tumor markers or other high-risk factors) of ovarian cancer. Reference: JACR 2020 Feb; 17(2):248-254 Electronically Signed   By: Guadlupe Spanish M.D.   On: 10/17/2020 16:52    Procedures .Critical Care  Date/Time: 10/17/2020 5:04 PM Performed by: Virgina Norfolk, DO Authorized by: Virgina Norfolk, DO   Critical care provider statement:    Critical care time (minutes):  35   Critical care was necessary to treat or prevent imminent or life-threatening deterioration of the following conditions:  Dehydration and renal failure   Critical care was time spent personally by me on the following activities:  Blood draw for specimens, development of treatment plan with patient or surrogate, discussions with primary provider, evaluation of patient's response to treatment, examination of patient, obtaining history from patient or surrogate, ordering and performing treatments and interventions, ordering and review of laboratory studies, ordering and review of radiographic studies, pulse oximetry, re-evaluation of patient's condition and review of old charts   I assumed direction of critical care for this patient from another provider in my specialty: no     Care discussed with:  admitting provider     Medications Ordered in ED Medications  0.9 %  sodium chloride infusion (has no administration in time range)  sodium chloride 0.9 % bolus 1,000 mL (has no administration in time range)  lactated ringers bolus 1,000 mL (1,000 mLs Intravenous New Bag/Given 10/17/20 1532)    ED Course  I have reviewed the triage vital signs and the nursing notes.  Pertinent labs & imaging results that were available during my care of the patient were reviewed by me and considered in my medical decision making (see chart for details).    MDM Rules/Calculators/A&P                           [name] is here with bilateral flank pain.  Has fever and mild  tachycardia.  Overall appears well.  Has had some bilateral flank pain and concern for UTI for the last couple days.  Felt better after she gave her self Toradol at home.  She is also been taking Diflucan neck and took Macrobid yesterday.  She had extensive amount of nausea vomiting diarrhea yesterday as well that has improved but still has some nausea today.  Infectious work-up to be initiated.  Technically fever and mild tachycardia but have low concern for sepsis.  We will send 1 blood culture.  We will hold off antibiotics as she appears well.  Could be viral process.  Could be UTI.  Lab work consistent with significant AKI with a creatinine of 3.7.  Otherwise no significant leukocytosis, anemia, electrolyte abnormality.CT scan of abdomen and pelvis is overall unremarkable.  Overall suspect viral GI process.  Also suspect NSAID use causing part of AKI as well.  Will give additional fluid bolus and start maintenance IV fluids and have her admitted to hospitalist for further care.  GI stool pathogen panel to be collected as well.  This chart was dictated using voice recognition software.  Despite best efforts to proofread,  errors can occur which can change the documentation meaning.   Final Clinical Impression(s) / ED Diagnoses Final diagnoses:  AKI (acute kidney injury) (HCC)  Nausea and vomiting, intractability of vomiting not specified, unspecified vomiting type  Diarrhea, unspecified type  Fever, unspecified fever cause    Rx / DC Orders ED Discharge Orders     None        Virgina Norfolk, DO 10/17/20 1705

## 2020-10-17 NOTE — Care Plan (Signed)
Was called by ED physician patient 40 year old female presenting with bilateral flank pain, nausea vomiting diarrhea x1 to 2 days with subjective fevers.  Patient noted to have taken some Macrobid and Toradol at home but no significant improvement and presented to the ED.  Patient seen in the ED noted to be in acute renal failure with a creatinine of 3.7, noted to be dehydrated.  Patient also noted to have used some NSAIDs prior to admission.  COVID-19 PCR negative.  C. difficile PCR and GI pathogen panel pending.  Blood cultures ordered and pending.  Patient given a 2 L bolus of IV fluids in the ED hospitalist were called to admit the patient for further evaluation and management.  Patient accepted to MedSurg bed at [facility].   No charge.

## 2020-10-17 NOTE — Telephone Encounter (Signed)
error 

## 2020-10-17 NOTE — ED Triage Notes (Signed)
Bilateral flank pain radiating to her Bilateral UQ.  She gave herself Toradol IM Sunday night with relief.

## 2020-10-17 NOTE — ED Notes (Signed)
Handoff report given to [facility] 

## 2020-10-17 NOTE — Patient Instructions (Signed)
Seek prompt inperson care at the medcenter right away today as we discussed. Call 911 if you are experiencing severe or life threatening symptoms.    I hope you are feeling better soon!  It was nice to meet you today. I help [place] out with telemedicine visits on Tuesdays and Thursdays and am available for visits on those days. If you have any concerns or questions following this visit please schedule a follow up visit with your Primary Care doctor or seek care at a local urgent care clinic to avoid delays in care.

## 2020-10-17 NOTE — ED Notes (Signed)
[facility] at bedside 

## 2020-10-17 NOTE — Progress Notes (Signed)
Virtual Visit via Video Note  I connected with Kayla Floyd  on 10/17/20 at  1:00 PM EDT by a video enabled telemedicine application and verified that I am speaking with the correct person using two identifiers.  Location patient: home, [place] Location provider:work or home office Persons participating in the virtual visit: patient, provider  I discussed the limitations of evaluation and management by telemedicine and the availability of in person appointments. The patient expressed understanding and agreed to proceed.   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 -she did a toradol inj and two doses of macrobid at home because she did not want to go to the ER, but feels worses and now feels she is having muscle twitching all over and reports she keeps dropping things and feels shaky -Denies:fevers, hematuria, HA, CP, SOB, travel/sick contacts -reports has been drinking fluids -Pertinent past medical history: see below and reports hx of bad kidney infection with kidney failure -Pertinent medication allergies: Allergies  Allergen Reactions   Alpha-Gal Anaphylaxis   Beef-Derived Products     anaphylaxes    Glycerol, Iodinated Anaphylaxis    anaphylaxes    Milk-Related Compounds Anaphylaxis    Cheese, whey protein, gelatin   Pork-Derived Products     anaplaxis  -COVID-19 vaccine status: vaccinated x2  ROS: See pertinent positives and negatives per HPI.  Past Medical History:  Diagnosis Date   ANXIETY    BACK PAIN, CHRONIC    DYSAUTONOMIA 2011   "POTS" , recurrent syncope - follows with cards for same   GERD    HSV (herpes simplex virus) anogenital infection  06/2015   cervix swab   HYPERLIPIDEMIA    Kidney failure    MIGRAINE HEADACHE    Palpitations    POLYCYSTIC OVARIAN DISEASE    POTS (postural orthostatic tachycardia syndrome)    Tear meniscus knee 06/14/2010   right    Past Surgical History:  Procedure Laterality Date   Cervical spine  2010   ACDF c5-6   crevical fusion   2010   evs     LUMBAR LAMINECTOMY  2008   TONSILLECTOMY AND ADENOIDECTOMY       Current Outpatient Medications:    atenolol (TENORMIN) 25 MG tablet, TAKE 1 TABLET BY MOUTH EVERY DAY, Disp: 30 tablet, Rfl: 2   atorvastatin (LIPITOR) 10 MG tablet, TAKE 1 TABLET BY MOUTH EVERY DAY, Disp: 30 tablet, Rfl: 2   b complex vitamins tablet, Take 1 tablet by mouth daily., Disp: , Rfl:    baclofen (LIORESAL) 10 MG tablet, TAKE 1 TABLET BY MOUTH THREE TIMES A DAY AS NEEDED FOR MUSCLE SPASM, Disp: 90 tablet, Rfl: 0   botulinum toxin Type A (BOTOX) 100 units SOLR injection, Inject 100 Units into the muscle every 3 (three) months. Every 12 weeks at [facility] clinic, Disp: 2 vial, Rfl:    cetirizine (ZYRTEC) 10 MG tablet, Take 10 mg by mouth daily. Reported on 07/13/2015, Disp: , Rfl:    diclofenac (VOLTAREN) 75 MG EC tablet, Take 1 tablet (75 mg total) by mouth 2 (two) times daily. Will need a follow up appointment for additional refills, Disp: 60 tablet, Rfl: 0  DULoxetine (CYMBALTA) 30 MG capsule, Take 1 capsule (30 mg total) by mouth daily. Take daily in addition to 60 mg daily for 90 mg daily, Disp: 90 capsule, Rfl: 1   DULoxetine (CYMBALTA) 60 MG capsule, TAKE 1 CAPSULE BY MOUTH EVERY DAY. WILL NEED A FOLLOW UP APPOINTMENT FOR ADDITIONAL REFILLS, Disp: 30 capsule, Rfl: 5   fenofibrate (TRICOR) 145 MG tablet, TAKE 1 TABLET (145MG  TOTAL) BY MOUTH EVERY DAY. PLEASE SCHEDULE FOLLOW UP FOR FUTURE REFILLS, Disp: 30 tablet, Rfl: 2   hyoscyamine (LEVSIN SL) 0.125 MG SL tablet, Place 1 tablet (0.125 mg total) under the tongue every 6 (six) hours as needed., Disp: 45 tablet, Rfl: 1    hyoscyamine (LEVSIN SL) 0.125 MG SL tablet, PLACE 1 TABLET (0.125 MG TOTAL) UNDER THE TONGUE EVERY 6 (SIX) HOURS AS NEEDED., Disp: 120 tablet, Rfl: 1   MELATONIN PO, Take 10 mg by mouth daily., Disp: , Rfl:    metFORMIN (GLUCOPHAGE) 1000 MG tablet, TAKE 1 TABLET BY MOUTH TWICE A DAY WITH MEALS, Disp: 60 tablet, Rfl: 2   mirtazapine (REMERON) 45 MG tablet, TAKE 1 TABLET BY MOUTH EVERYDAY AT BEDTIME, Disp: 30 tablet, Rfl: 2   Multiple Vitamin (MULTIVITAMIN) tablet, Take 1 tablet by mouth daily.  , Disp: , Rfl:    nitrofurantoin, macrocrystal-monohydrate, (MACROBID) 100 MG capsule, TAKE 1 CAPSULE BY MOUTH ONCE FOR 1 DOSE. WITH INTERCOURSE, Disp: 30 capsule, Rfl: 3   norethindrone-ethinyl estradiol (JUNEL 1/20) 1-20 MG-MCG tablet, Take 1 tablet by mouth daily., Disp: 63 tablet, Rfl: 4   omeprazole (PRILOSEC) 40 MG capsule, TAKE 1 CAPSULE BY MOUTH EVERY DAY, Disp: 30 capsule, Rfl: 2   pregabalin (LYRICA) 150 MG capsule, TAKE 1 CAPSULE BY MOUTH TWICE A DAY, Disp: 60 capsule, Rfl: 2   Probiotic Product (PROBIOTIC-10 PO), Take by mouth., Disp: , Rfl:    promethazine (PHENERGAN) 12.5 MG tablet, Take by mouth., Disp: , Rfl:    tiZANidine (ZANAFLEX) 4 MG tablet, TAKE 1 TABLET BY MOUTH EVERY 8 HOURS AS NEEDED FOR MUSCLE SPASM, Disp: 90 tablet, Rfl: 1   valACYclovir (VALTREX) 1000 MG tablet, TAKE 1 TABLET DAILY. INCREASE TO TWICE A DAY WITH OUTBREAKS, Disp: 60 tablet, Rfl: 2  EXAM:  VITALS per patient if applicable:  GENERAL: alert, oriented, appears well and in no acute distress  HEENT: atraumatic, conjunttiva clear, no obvious abnormalities on inspection of external nose and ears  NECK: normal movements of the head and neck  LUNGS: on inspection no signs of respiratory distress, breathing rate appears normal, no obvious gross SOB, gasping or wheezing  CV: no obvious cyanosis  MS: moves all visible extremities without noticeable abnormality  PSYCH/NEURO: pleasant and cooperative, no obvious  depression or anxiety, speech and thought processing grossly intact  ASSESSMENT AND PLAN:  Discussed the following assessment and plan:  Flank pain  Nausea and vomiting, intractability of vomiting not specified, unspecified vomiting type  Brain fog  Impaired gait  Muscle twitching  -we discussed possible serious and likely etiologies, options for evaluation and workup, limitations of telemedicine visit vs in person visit, treatment, treatment risks and precautions.  Possible gastroenteritis with dehydration, complicated urinary tract infection covid or other. Given the nature of her reported symptoms advise prompt inperson evaluation right away. Her PCP office is not available. She agrees to seek inperson care at a local medcenter as prefers to avoid the hospital ER. Advise if a severe etiology is discovered (hoping that is not the case) such as sepsis or renal failure,  etc. She would then have to be transported to the hospital. She understands. She prefers to go by private vehicle and reports her husband will take her right away today.  I discussed the assessment and treatment plan with the patient. The patient was provided an opportunity to ask questions and all were answered. The patient agreed with the plan and demonstrated an understanding of the instructions.     Terressa Koyanagi, DO

## 2020-10-17 NOTE — ED Notes (Signed)
Handoff report given to [facility] on [address] long

## 2020-10-18 DIAGNOSIS — I10 Essential (primary) hypertension: Secondary | ICD-10-CM

## 2020-10-18 DIAGNOSIS — R197 Diarrhea, unspecified: Secondary | ICD-10-CM | POA: Diagnosis not present

## 2020-10-18 DIAGNOSIS — M549 Dorsalgia, unspecified: Secondary | ICD-10-CM | POA: Diagnosis not present

## 2020-10-18 DIAGNOSIS — R509 Fever, unspecified: Secondary | ICD-10-CM

## 2020-10-18 DIAGNOSIS — D649 Anemia, unspecified: Secondary | ICD-10-CM

## 2020-10-18 DIAGNOSIS — N179 Acute kidney failure, unspecified: Secondary | ICD-10-CM | POA: Diagnosis not present

## 2020-10-18 DIAGNOSIS — G8929 Other chronic pain: Secondary | ICD-10-CM

## 2020-10-18 DIAGNOSIS — R6510 Systemic inflammatory response syndrome (SIRS) of non-infectious origin without acute organ dysfunction: Secondary | ICD-10-CM

## 2020-10-18 LAB — BASIC METABOLIC PANEL
Anion gap: 9 (ref 5–15)
BUN: 24 mg/dL — ABNORMAL HIGH (ref 6–20)
CO2: 22 mmol/L (ref 22–32)
Calcium: 9.3 mg/dL (ref 8.9–10.3)
Chloride: 110 mmol/L (ref 98–111)
Creatinine, Ser: 2.95 mg/dL — ABNORMAL HIGH (ref 0.44–1.00)
GFR, Estimated: 20 mL/min — ABNORMAL LOW (ref >60)
Glucose, Bld: 111 mg/dL — ABNORMAL HIGH (ref 70–99)
Potassium: 4.8 mmol/L (ref 3.5–5.1)
Sodium: 141 mmol/L (ref 135–145)

## 2020-10-18 LAB — URINALYSIS, COMPLETE (UACMP) WITH MICROSCOPIC
Bilirubin Urine: NEGATIVE
Glucose, UA: NEGATIVE mg/dL
Hgb urine dipstick: NEGATIVE
Ketones, ur: NEGATIVE mg/dL
Leukocytes,Ua: NEGATIVE
Nitrite: NEGATIVE
Protein, ur: NEGATIVE mg/dL
Specific Gravity, Urine: 1.004 — ABNORMAL LOW (ref 1.005–1.030)
pH: 6 (ref 5.0–8.0)

## 2020-10-18 LAB — CBC
HCT: 30.8 % — ABNORMAL LOW (ref 36.0–46.0)
Hemoglobin: 9.5 g/dL — ABNORMAL LOW (ref 12.0–15.0)
MCH: 24.6 pg — ABNORMAL LOW (ref 26.0–34.0)
MCHC: 30.8 g/dL (ref 30.0–36.0)
MCV: 79.8 fL — ABNORMAL LOW (ref 80.0–100.0)
Platelets: 273 10*3/uL (ref 150–400)
RBC: 3.86 MIL/uL — ABNORMAL LOW (ref 3.87–5.11)
RDW: 14.7 % (ref 11.5–15.5)
WBC: 9.2 10*3/uL (ref 4.0–10.5)
nRBC: 0 % (ref 0.0–0.2)

## 2020-10-18 LAB — RETICULOCYTES
Immature Retic Fract: 12.6 % (ref 2.3–15.9)
RBC.: 3.77 MIL/uL — ABNORMAL LOW (ref 3.87–5.11)
Retic Count, Absolute: 46.4 10*3/uL (ref 19.0–186.0)
Retic Ct Pct: 1.2 % (ref 0.4–3.1)

## 2020-10-18 LAB — IRON AND TIBC
Iron: 18 ug/dL — ABNORMAL LOW (ref 28–170)
Saturation Ratios: 3 % — ABNORMAL LOW (ref 10.4–31.8)
TIBC: 531 ug/dL — ABNORMAL HIGH (ref 250–450)
UIBC: 513 ug/dL

## 2020-10-18 LAB — HIV ANTIBODY (ROUTINE TESTING W REFLEX): HIV Screen 4th Generation wRfx: NONREACTIVE

## 2020-10-18 LAB — VITAMIN B12: Vitamin B-12: 407 pg/mL (ref 180–914)

## 2020-10-18 LAB — FOLATE: Folate: 33.2 ng/mL (ref >5.9)

## 2020-10-18 LAB — FERRITIN: Ferritin: 6 ng/mL — ABNORMAL LOW (ref 11–307)

## 2020-10-18 MED ORDER — SODIUM CHLORIDE 0.45 % IV SOLN: INTRAVENOUS | Status: DC

## 2020-10-18 MED ORDER — ADULT MULTIVITAMIN W/MINERALS CH
1.0000 | ORAL_TABLET | Freq: Every day | ORAL | Status: DC
  Administered 2020-10-18 – 2020-10-19 (×2): 1 via ORAL
  Filled 2020-10-18 (×2): qty 1

## 2020-10-18 MED ORDER — INSULIN ASPART 100 UNIT/ML IJ SOLN: 6.0000 [IU] | Freq: Once | INTRAMUSCULAR | Status: DC

## 2020-10-18 MED ORDER — FERROUS SULFATE 325 (65 FE) MG PO TABS
325.0000 mg | ORAL_TABLET | Freq: Every day | ORAL | Status: DC
  Administered 2020-10-19: 325 mg via ORAL
  Filled 2020-10-18: qty 1

## 2020-10-18 NOTE — Progress Notes (Signed)
PROGRESS NOTE    JENICA COSTILOW  TDV:[id number] DOB: 09-03-1980 DOA: 10/17/2020 PCP: Pincus Sanes, MD    Brief Narrative:  Mrs. Zenor was admitted to the hospital with a working diagnosis of acute kidney injury.  40 year old female past medical history for chronic back pain syndrome and POTS who presented with nausea, vomiting, diarrhea and flank pain for about 2 days.  On her initial physical examination blood pressure 116/95, heart rate 93-104, respiratory rate 25, temperature 98.8, oxygen saturation 100%, her lungs were clear to auscultation bilaterally, heart S1-S2, present, rhythmic, soft abdomen, no lower extremity edema.  Sodium 136, potassium 4.7, chloride 104, bicarb 23, glucose 87, BUN 26, creatinine 3.77, white count 8.8, hemoglobin 9.1, hematocrit 28.4, platelets 271. SARS COVID-19 negative.  Urinalysis specific gravity 1.004, 0-5 white cells, 0-5 red cells, no casts. Renal CT with no urinary calculi.  Cholelithiasis.  Chest radiograph no infiltrates.  EKG 96 bpm, normal axis, normal intervals, sinus rhythm, no ST segment or T wave changes.  Assessment & Plan:   Principal Problem:   AKI (acute kidney injury) (HCC) Active Problems:   Essential hypertension   Chronic back pain   Nausea vomiting and diarrhea   Anemia   1,. AKI. Suspected pre-renal renal failure. Renal failure with serum cr down to 2,9 with K at 4,8 and serum bicarbonate at 22, CL is 110. Improved po intake, nausea and diarrhea but not resolved.  Continue with hypotonic saline at 50 ml per hr to prevent hypernatremia and hyperchloremia. Antiacid therapy and as needed antiemetics.   Follow up renal function in am.    2. Iron deficiency anemia iron panel with serum iron of 18, TIBC 531, transferrin saturation 3 and ferritin of 6. Add oral iron supplementation   3. HTN/ dyslipidemia, Continue blood pressure monitoring . Continue with atenolol.  Continue statin therapy and fenofibrate. .    4. Adnexal cyst, follow up as outpatient.   5. Chronic back pain. Hold on non steroidal anti-inflammatories for now.  Continue with duloxetine, mirtazapine, pregabaline and tizanidine.    Status is: Inpatient  Remains inpatient appropriate because:IV treatments appropriate due to intensity of illness or inability to take PO  Dispo: The patient is from: Home              Anticipated d/c is to: Home              Patient currently is not medically stable to d/c.   Difficult to place patient No   DVT prophylaxis: Heparin  Code Status:   full  Family Communication:  I spoke with patient's husband at the bedside, we talked in detail about patient's condition, plan of care and prognosis and all questions were addressed.    Subjective: Patient having episodic spasms. Improving po intake and nausea , no vomiting and diarrhea is improving.   Objective: Vitals:   10/17/20 2156 10/18/20 0309 10/18/20 0508 10/18/20 1325  BP: (!) 130/93 (!) 95/58 (!) 110/58 124/71  Pulse: 98 88 88 94  Resp: 16 20 20 18   Temp: 98 F (36.7 C) 98.3 F (36.8 C) 98 F (36.7 C) 97.9 F (36.6 C)  TempSrc: Oral  Oral   SpO2: 100% 97% 100% 99%  Weight:      Height:        Intake/Output Summary (Last 24 hours) at 10/18/2020 1603 Last data filed at 10/18/2020 1437 Gross per 24 hour  Intake 1440 ml  Output 3050 ml  Net -1610 ml  Filed Weights   10/17/20 1444  Weight: 88.5 kg    Examination:   General: Not in pain or dyspnea, deconditioned  Neurology: Awake and alert, non focal  E ENT: mild pallor, no icterus, oral mucosa moist Cardiovascular: No JVD. S1-S2 present, rhythmic, no gallops, rubs, or murmurs. No lower extremity edema. Pulmonary: vesicular breath sounds bilaterally, adequate air movement, no wheezing, rhonchi or rales. Gastrointestinal. Abdomen soft and non tender Skin. No rashes Musculoskeletal: no joint deformities     Data Reviewed: I have personally reviewed following labs  and imaging studies  CBC: Recent Labs  Lab 10/17/20 1524 10/18/20 0330  WBC 8.8 9.2  NEUTROABS 5.6  --   HGB 9.1* 9.5*  HCT 28.4* 30.8*  MCV 78.0* 79.8*  PLT 271 273   Basic Metabolic Panel: Recent Labs  Lab 10/17/20 1524 10/18/20 0330  NA 136 141  K 4.7 4.8  CL 104 110  CO2 23 22  GLUCOSE 87 111*  BUN 26* 24*  CREATININE 3.77* 2.95*  CALCIUM 9.5 9.3   GFR: Estimated Creatinine Clearance: 27.9 mL/min (A) (by C-G formula based on SCr of 2.95 mg/dL (H)). Liver Function Tests: Recent Labs  Lab 10/17/20 1524  AST 20  ALT 20  ALKPHOS 28*  BILITOT 0.3  PROT 6.7  ALBUMIN 4.1   No results for input(s): LIPASE, AMYLASE in the last 168 hours. No results for input(s): AMMONIA in the last 168 hours. Coagulation Profile: Recent Labs  Lab 10/17/20 1524  INR 0.9   Cardiac Enzymes: No results for input(s): CKTOTAL, CKMB, CKMBINDEX, TROPONINI in the last 168 hours. BNP (last 3 results) No results for input(s): PROBNP in the last 8760 hours. HbA1C: No results for input(s): HGBA1C in the last 72 hours. CBG: No results for input(s): GLUCAP in the last 168 hours. Lipid Profile: No results for input(s): CHOL, HDL, LDLCALC, TRIG, CHOLHDL, LDLDIRECT in the last 72 hours. Thyroid Function Tests: No results for input(s): TSH, T4TOTAL, FREET4, T3FREE, THYROIDAB in the last 72 hours. Anemia Panel: Recent Labs    10/18/20 0330  VITAMINB12 407  FOLATE 33.2  FERRITIN 6*  TIBC 531*  IRON 18*  RETICCTPCT 1.2      Radiology Studies: I have reviewed all of the imaging during this hospital visit personally     Scheduled Meds:  atenolol  25 mg Oral Daily   atorvastatin  10 mg Oral Daily   DULoxetine  30 mg Oral Daily   DULoxetine  60 mg Oral Daily   fenofibrate  160 mg Oral Daily   mirtazapine  45 mg Oral QHS   multivitamin with minerals  1 tablet Oral Daily   pantoprazole  80 mg Oral Daily   pregabalin  150 mg Oral BID   Continuous Infusions:  sodium chloride  50 mL/hr at 10/18/20 1350     LOS: 1 day        Wake Conlee Annett Gula, MD

## 2020-10-18 NOTE — H&P (Signed)
History and Physical    Kayla Floyd LNL:[id number] DOB: 1980-10-14 DOA: 10/17/2020  PCP: Binnie Rail, MD  Patient coming from: Home  I have personally briefly reviewed patient's old medical records in [place]  Chief Complaint: Flank pain  HPI: Kayla Floyd is a 40 y.o. female with medical history significant of POTS, chronic back pain.  Pt presents to ED with c/o N/V/D, B flank pain for past 2 days. Symptoms onset 2 days ago.  Symptoms intermittent, persistent.  Took toradol IV at home for symptoms without much relief.  Decreased UOP today.  No CP, no SOB.   ED Course: Creat 3.77, BUN 26.  Baseline Creat 0.77 as of March 2021.  CXR neg, UA neg.  COVID and flu neg.  Urine preg neg  CT renal stone study: just a 5cm fluid density R adnexal lesion.   Review of Systems: As per HPI, otherwise all review of systems negative.  Past Medical History:  Diagnosis Date   ANXIETY    BACK PAIN, CHRONIC    DYSAUTONOMIA 2011   "POTS" , recurrent syncope - follows with cards for same   GERD    HSV (herpes simplex virus) anogenital infection 06/2015   cervix swab   HYPERLIPIDEMIA    Kidney failure    MIGRAINE HEADACHE    Palpitations    POLYCYSTIC OVARIAN DISEASE    POTS (postural orthostatic tachycardia syndrome)    Tear meniscus knee 06/14/2010   right    Past Surgical History:  Procedure Laterality Date   Cervical spine  2010   ACDF c5-6   crevical fusion   2010   evs     LUMBAR LAMINECTOMY  2008   TONSILLECTOMY AND ADENOIDECTOMY       reports that she has quit smoking. Her smoking use included cigarettes. She has a 10.00 pack-year smoking history. She has quit using smokeless tobacco. She reports current alcohol use. She reports that she does not use drugs.  Allergies  Allergen Reactions   Alpha-Gal Anaphylaxis   Beef-Derived Products     anaphylaxes    Glycerol, Iodinated Anaphylaxis    anaphylaxes    Milk-Related Compounds Anaphylaxis     Cheese, whey protein, gelatin   Pork-Derived Products     anaplaxis    Family History  Problem Relation Age of Onset   Bipolar disorder Mother    Stroke Mother        Age of 19 while on OCP   Other Father        MVA   Heart disease Father    Heart attack Father    Colon cancer Maternal Grandmother 25   Cancer Maternal Grandmother    Hypertension Sister    Hypertension Sister    Migraines Sister    Breast cancer Paternal Grandmother        dx in her 65s-40s   Cancer Paternal Uncle        ocular   Breast cancer Paternal Aunt 15   Lung cancer Paternal Uncle      Prior to Admission medications   Medication Sig Start Date End Date Taking? Authorizing Provider  atenolol (TENORMIN) 25 MG tablet TAKE 1 TABLET BY MOUTH EVERY DAY 10/02/20  Yes Burns, Claudina Lick, MD  atorvastatin (LIPITOR) 10 MG tablet TAKE 1 TABLET BY MOUTH EVERY DAY 10/06/20  Yes Burns, Claudina Lick, MD  b complex vitamins tablet Take 1 tablet by mouth daily.   Yes [provider]  baclofen (  LIORESAL) 10 MG tablet TAKE 1 TABLET BY MOUTH THREE TIMES A DAY AS NEEDED FOR MUSCLE SPASM 10/06/20  Yes Janith Lima, MD  cetirizine (ZYRTEC) 10 MG tablet Take 10 mg by mouth daily. Reported on 07/13/2015   Yes [provider]  diclofenac (VOLTAREN) 75 MG EC tablet Take 1 tablet (75 mg total) by mouth 2 (two) times daily. Will need a follow up appointment for additional refills 10/06/20  Yes Janith Lima, MD  DULoxetine (CYMBALTA) 30 MG capsule Take 1 capsule (30 mg total) by mouth daily. Take daily in addition to 60 mg daily for 90 mg daily 05/31/20  Yes Burns, Claudina Lick, MD  DULoxetine (CYMBALTA) 60 MG capsule TAKE 1 CAPSULE BY MOUTH EVERY DAY. WILL NEED A FOLLOW UP APPOINTMENT FOR ADDITIONAL REFILLS 10/06/20  Yes Burns, Claudina Lick, MD  fenofibrate (TRICOR) 145 MG tablet TAKE 1 TABLET ($RemoveB'145MG'[id number]$  TOTAL) BY MOUTH EVERY DAY. PLEASE SCHEDULE FOLLOW UP FOR FUTURE REFILLS 10/02/20  Yes Burns, Claudina Lick, MD  hyoscyamine (LEVSIN SL)  0.125 MG SL tablet Place 1 tablet (0.125 mg total) under the tongue every 6 (six) hours as needed. 04/10/20  Yes Danis, Kirke Corin, MD  MELATONIN PO Take 10 mg by mouth daily.   Yes [provider]  metFORMIN (GLUCOPHAGE) 1000 MG tablet TAKE 1 TABLET BY MOUTH TWICE A DAY WITH MEALS 10/06/20  Yes Burns, Claudina Lick, MD  mirtazapine (REMERON) 45 MG tablet TAKE 1 TABLET BY MOUTH EVERYDAY AT BEDTIME 10/06/20  Yes Burns, Claudina Lick, MD  Multiple Vitamin (MULTIVITAMIN) tablet Take 1 tablet by mouth daily.     Yes [provider]  omeprazole (PRILOSEC) 40 MG capsule TAKE 1 CAPSULE BY MOUTH EVERY DAY 10/06/20  Yes Burns, Claudina Lick, MD  pregabalin (LYRICA) 150 MG capsule TAKE 1 CAPSULE BY MOUTH TWICE A DAY 10/02/20  Yes Burns, Claudina Lick, MD  Probiotic Product (PROBIOTIC-10 PO) Take by mouth.   Yes [provider]  tiZANidine (ZANAFLEX) 4 MG tablet TAKE 1 TABLET BY MOUTH EVERY 8 HOURS AS NEEDED FOR MUSCLE SPASM 08/07/20  Yes Burns, Claudina Lick, MD  valACYclovir (VALTREX) 1000 MG tablet TAKE 1 TABLET DAILY. INCREASE TO TWICE A DAY WITH OUTBREAKS 08/07/20  Yes Burns, Claudina Lick, MD  botulinum toxin Type A (BOTOX) 100 units SOLR injection Inject 100 Units into the muscle every 3 (three) months. Every 12 weeks at [facility] 03/15/15   Rowe Clack, MD  nitrofurantoin, macrocrystal-monohydrate, (MACROBID) 100 MG capsule TAKE 1 CAPSULE BY MOUTH ONCE FOR 1 DOSE. WITH INTERCOURSE 09/23/19   Binnie Rail, MD  norethindrone-ethinyl estradiol (JUNEL 1/20) 1-20 MG-MCG tablet Take 1 tablet by mouth daily. 11/27/18   Fontaine, Belinda Block, MD  promethazine (PHENERGAN) 12.5 MG tablet Take by mouth.    [provider]    Physical Exam: Vitals:   10/17/20 1700 10/17/20 2000 10/17/20 2056 10/17/20 2156  BP: (!) 116/95 129/83  (!) 130/93  Pulse: 93 (!) 104  98  Resp: (!) 25 (!) 25  16  Temp:   98.8 F (37.1 C) 98 F (36.7 C)  TempSrc:   Oral Oral  SpO2: 100% 100%  100%  Weight:      Height:         Constitutional: NAD, calm, comfortable Eyes: PERRL, lids and conjunctivae normal ENMT: Mucous membranes are moist. Posterior pharynx clear of any exudate or lesions.Normal dentition.  Neck: normal, supple, no masses, no thyromegaly Respiratory: clear to auscultation bilaterally, no  wheezing, no crackles. Normal respiratory effort. No accessory muscle use.  Cardiovascular: Regular rate and rhythm, no murmurs / rubs / gallops. No extremity edema. 2+ pedal pulses. No carotid bruits.  Abdomen: no tenderness, no masses palpated. No hepatosplenomegaly. Bowel sounds positive.  Musculoskeletal: no clubbing / cyanosis. No joint deformity upper and lower extremities. Good ROM, no contractures. Normal muscle tone.  Skin: no rashes, lesions, ulcers. No induration Neurologic: CN 2-12 grossly intact. Sensation intact, DTR normal. Strength 5/5 in all 4.  Psychiatric: Normal judgment and insight. Alert and oriented x 3. Normal mood.    Labs on Admission: I have personally reviewed following labs and imaging studies  CBC: Recent Labs  Lab 10/17/20 1524  WBC 8.8  NEUTROABS 5.6  HGB 9.1*  HCT 28.4*  MCV 78.0*  PLT 812   Basic Metabolic Panel: Recent Labs  Lab 10/17/20 1524  NA 136  K 4.7  CL 104  CO2 23  GLUCOSE 87  BUN 26*  CREATININE 3.77*  CALCIUM 9.5   GFR: Estimated Creatinine Clearance: 21.8 mL/min (A) (by C-G formula based on SCr of 3.77 mg/dL (H)). Liver Function Tests: Recent Labs  Lab 10/17/20 1524  AST 20  ALT 20  ALKPHOS 28*  BILITOT 0.3  PROT 6.7  ALBUMIN 4.1   No results for input(s): LIPASE, AMYLASE in the last 168 hours. No results for input(s): AMMONIA in the last 168 hours. Coagulation Profile: Recent Labs  Lab 10/17/20 1524  INR 0.9   Cardiac Enzymes: No results for input(s): CKTOTAL, CKMB, CKMBINDEX, TROPONINI in the last 168 hours. BNP (last 3 results) No results for input(s): PROBNP in the last 8760 hours. HbA1C: No results for input(s):  HGBA1C in the last 72 hours. CBG: No results for input(s): GLUCAP in the last 168 hours. Lipid Profile: No results for input(s): CHOL, HDL, LDLCALC, TRIG, CHOLHDL, LDLDIRECT in the last 72 hours. Thyroid Function Tests: No results for input(s): TSH, T4TOTAL, FREET4, T3FREE, THYROIDAB in the last 72 hours. Anemia Panel: No results for input(s): VITAMINB12, FOLATE, FERRITIN, TIBC, IRON, RETICCTPCT in the last 72 hours. Urine analysis:    Component Value Date/Time   COLORURINE YELLOW 10/17/2020 1524   APPEARANCEUR CLEAR 10/17/2020 1524   LABSPEC 1.011 10/17/2020 1524   PHURINE 5.5 10/17/2020 1524   GLUCOSEU NEGATIVE 10/17/2020 1524   GLUCOSEU NEGATIVE 09/29/2017 1336   HGBUR NEGATIVE 10/17/2020 1524   HGBUR negative 03/20/2010 1006   BILIRUBINUR NEGATIVE 10/17/2020 1524   BILIRUBINUR negative 10/28/2017 [place] 10/17/2020 1524   PROTEINUR TRACE (A) 10/17/2020 1524   UROBILINOGEN 0.2 10/28/2017 1156   UROBILINOGEN 0.2 09/29/2017 1336   NITRITE NEGATIVE 10/17/2020 1524   LEUKOCYTESUR NEGATIVE 10/17/2020 1524    Radiological Exams on Admission: DG Chest Port 1 View  Result Date: 10/17/2020 CLINICAL DATA:  Sepsis. EXAM: PORTABLE CHEST 1 VIEW COMPARISON:  February 06, 2009. FINDINGS: The heart size and mediastinal contours are within normal limits. Both lungs are clear. The visualized skeletal structures are unremarkable. IMPRESSION: No active disease. Electronically Signed   By: Marijo Conception M.D.   On: 10/17/2020 15:25   CT Renal Stone Study  Result Date: 10/17/2020 CLINICAL DATA:  Pain; ER nurse note states bilateral flank pain EXAM: CT ABDOMEN AND PELVIS WITHOUT CONTRAST TECHNIQUE: Multidetector CT imaging of the abdomen and pelvis was performed following the standard protocol without IV contrast. COMPARISON:  None. FINDINGS: Lower chest: No acute abnormality. Hepatobiliary: Decreased hepatic attenuation reflecting steatosis. There is a 2 cm  rim calcified  gallstone. No biliary dilatation Pancreas: Unremarkable. Spleen: Unremarkable. Adrenals/Urinary Tract: Adrenals are unremarkable. Kidneys and bladder are unremarkable. Stomach/Bowel: Stomach is within normal limits. Bowel is normal in caliber. Normal appendix. Vascular/Lymphatic: Minimal aortic atherosclerosis. No enlarged lymph nodes. Reproductive: Uterus is unremarkable. There is a 5 cm low-density right adnexal lesion. Other: No free fluid.  Abdominal wall is unremarkable. Musculoskeletal: Lower lumbar degenerative changes. IMPRESSION: No urinary tract calculi. Cholelithiasis. Hepatic steatosis. 5 cm fluid density right adnexal lesion. No follow-up imaging recommended. Note: This recommendation does not apply to premenarchal patients and to those with increased risk (genetic, family history, elevated tumor markers or other high-risk factors) of ovarian cancer. Reference: JACR 2020 Feb; 17(2):248-254 Electronically Signed   By: Macy Mis M.D.   On: 10/17/2020 16:52    EKG: Independently reviewed.  Assessment/Plan Principal Problem:   AKI (acute kidney injury) ([place]) Active Problems:   Essential hypertension   Chronic back pain   Nausea vomiting and diarrhea   Anemia   SIRS (systemic inflammatory response syndrome) (HCC)    AKI - Possibly due to dehydration from N/V/D + NSAIDs? IVF Hold all NSAIDs and nephrotoxic meds Strict intake and output No obvious obstruction on CT today UA very unremarkable: Would have expected more protein / hemoglobin if pt had some sort of acute nephritic syndrome. Will have them repeat and get a complete WITH microscopic. UCx pending to r/o infection. Repeat BMP in AM Likely warrants nephrology consult in AM. N/V/D + SIRS - Sounds like infectious gastroenteritis GI pathogen pnl pending No other obvious source of infection: UA neg COVID and Flu neg CXR neg Anemia - Anemia pnl pending HTN - Cont atenolol Chronic back pain - Hold NSAIDs Cont  lyrica Caution with muscle relaxer but continue if needed. Did warn her about this being a well known cause of AMS in AKI patients. Adnexal cyst - Let pt know about CT finding Pt actually has had studies for genetic markers for Ca done previously (BRCA1 BRCA2 etc) due to family members having very early onset breast CA and patient was negative for everything. Maybe just related to her PCOS?  DVT prophylaxis: Heparin [place] Code Status: Full Family Communication: No family in room Disposition Plan: Home after renal recovery Consults called: None Admission status: Admit to inpatient  Severity of Illness: The appropriate patient status for this patient is INPATIENT. Inpatient status is judged to be reasonable and necessary in order to provide the required intensity of service to ensure the patient's safety. The patient's presenting symptoms, physical exam findings, and initial radiographic and laboratory data in the context of their chronic comorbidities is felt to place them at high risk for further clinical deterioration. Furthermore, it is not anticipated that the patient will be medically stable for discharge from the hospital within 2 midnights of admission. The following factors support the patient status of inpatient.   Patient has acute kidney injury.  Patient has one of the following: Increase in Serum Creatinine >0.3 mg/dL within 48h Increase in Serum Creatinine > 1.5 times baseline known or presumed to have been within the last 7 days Urine volume < 0.5 ml/kg/hr for 6 hours    * I certify that at the point of admission it is my clinical judgment that the patient will require inpatient hospital care spanning beyond 2 midnights from the point of admission due to high intensity of service, high risk for further deterioration and high frequency of surveillance required.*   Zerline Melchior M. DO  [facility]  How to contact the [facility] Attending or Consulting provider [place] or  covering provider during after hours [place], for this patient?  Check the care team in [facility] and look for a) attending/consulting [facility] provider listed and b) the [facility] team listed Log into [email or web address]  Amion Physician Scheduling and messaging for groups and whole hospitals  On call and physician scheduling software for group practices, residents, hospitalists and other medical providers for call, clinic, rotation and shift schedules. On[employer] is a hospital-wide system for scheduling doctors and paging doctors on call. EasyPlot is for scientific plotting and data analysis.  [email or web address]  and use [place]'s universal password to access. If you do not have the password, please contact the hospital operator.  Locate the [facility] provider you are looking for under [facility] and page to a number that you can be directly reached. If you still have difficulty reaching the provider, please page the [facility] (Director on Call) for the Hospitalists listed on amion for assistance.  10/18/2020, 12:36 AM

## 2020-10-19 ENCOUNTER — Telehealth: Payer: Self-pay

## 2020-10-19 ENCOUNTER — Other Ambulatory Visit (HOSPITAL_COMMUNITY): Payer: Self-pay

## 2020-10-19 DIAGNOSIS — R112 Nausea with vomiting, unspecified: Secondary | ICD-10-CM | POA: Diagnosis not present

## 2020-10-19 DIAGNOSIS — D649 Anemia, unspecified: Secondary | ICD-10-CM | POA: Diagnosis not present

## 2020-10-19 DIAGNOSIS — I10 Essential (primary) hypertension: Secondary | ICD-10-CM | POA: Diagnosis not present

## 2020-10-19 DIAGNOSIS — N179 Acute kidney failure, unspecified: Secondary | ICD-10-CM | POA: Diagnosis not present

## 2020-10-19 LAB — BASIC METABOLIC PANEL
Anion gap: 5 (ref 5–15)
BUN: 18 mg/dL (ref 6–20)
CO2: 22 mmol/L (ref 22–32)
Calcium: 8.6 mg/dL — ABNORMAL LOW (ref 8.9–10.3)
Chloride: 113 mmol/L — ABNORMAL HIGH (ref 98–111)
Creatinine, Ser: 2.02 mg/dL — ABNORMAL HIGH (ref 0.44–1.00)
GFR, Estimated: 31 mL/min — ABNORMAL LOW (ref >60)
Glucose, Bld: 125 mg/dL — ABNORMAL HIGH (ref 70–99)
Potassium: 4.6 mmol/L (ref 3.5–5.1)
Sodium: 140 mmol/L (ref 135–145)

## 2020-10-19 LAB — GASTROINTESTINAL PANEL BY PCR, STOOL (REPLACES STOOL CULTURE)

## 2020-10-19 LAB — URINE CULTURE: Culture: <10000 — AB

## 2020-10-19 LAB — MAGNESIUM: Magnesium: 1.9 mg/dL (ref 1.7–2.4)

## 2020-10-19 MED ORDER — ACETAMINOPHEN 325 MG PO TABS: 650.0000 mg | ORAL_TABLET | Freq: Four times a day (QID) | ORAL | Status: AC | PRN

## 2020-10-19 MED ORDER — OMEPRAZOLE 40 MG PO CPDR: 40.0000 mg | DELAYED_RELEASE_CAPSULE | Freq: Every day | ORAL | Status: DC

## 2020-10-19 MED ORDER — FERROUS SULFATE 325 (65 FE) MG PO TABS
325.0000 mg | ORAL_TABLET | Freq: Every day | ORAL | 0 refills | Status: DC
  Filled 2020-10-19: qty 30, 30d supply, fill #0

## 2020-10-19 NOTE — Discharge Summary (Signed)
Physician Discharge Summary  Kayla Floyd ZOX:[id number] DOB: December 01, 1980 DOA: 10/17/2020  PCP: Pincus SanesBurns, Stacy J, MD  Admit date: 10/17/2020 Discharge date: 10/19/2020  Admitted From: Home  Disposition:  Home   Recommendations for Outpatient Follow-up and new medication changes:  Follow up with Dr. Lawerance BachBurns in 7 to 10 days.  Follow up renal function in 7 days. Discontinue non steroidal anti-inflammatory agents Added iron supplements for iron deficiency   Home Health: no   Equipment/Devices: no    Discharge Condition: stable  CODE STATUS: full  Diet recommendation:  hearth heathy and diabetic prudent.   Brief/Interim Summary: Kayla Floyd was admitted to the hospital with a working diagnosis of acute kidney injury.   40 year old female past medical history for chronic back pain syndrome and POTS who presented with nausea, vomiting, diarrhea and flank pain for about 2 days.  On her initial physical examination blood pressure 116/95, heart rate 93-104, respiratory rate 25, temperature 98.8, oxygen saturation 100%, her lungs were clear to auscultation bilaterally, heart S1-S2, present, rhythmic, soft abdomen, no lower extremity edema.   Sodium 136, potassium 4.7, chloride 104, bicarb 23, glucose 87, BUN 26, creatinine 3.77, white count 8.8, hemoglobin 9.1, hematocrit 28.4, platelets 271. SARS COVID-19 negative.   Urinalysis specific gravity 1.004, 0-5 white cells, 0-5 red cells, no casts. Renal CT with no urinary calculi.  Cholelithiasis.   Chest radiograph no infiltrates.   EKG 96 bpm, normal axis, normal intervals, sinus rhythm, no ST segment or T wave changes.  Patient received supportive medical therapy including IV fluids with improvement in renal function.  Plan to follow up as outpatient.   Acute kidney injury.  Likely a combination of prerenal renal failure and NSAID induced kidney injury. Patient was admitted to the medical ward, received supportive medical therapy  including intravenous fluids with good toleration. Her gastrointestinal symptoms improved, no further diarrhea, nausea or vomiting.  At her discharge sodium is 140, potassium 4.6, chloride 113, bicarb 22, glucose 125, BUN 18 and creatinine 2.0.  Magnesium 1.9. Her urine output over the last 24 hours is 4150 mL.  Recommend to follow-up kidney function as an outpatient, avoid NSAIDs or other nephrotoxic agents.  2.  Iron deficiency anemia.  Iron panel was iron stores show serum iron 18, TIBC 531, transferrin saturation of 30 and ferritin of 6.  Patient has been placed on oral iron supplementation, follow-up iron stores as an outpatient.  3.  Hypertension/dyslipidemia.  Her antihypertensive agents will continue with atenolol, continue statin and fenofibrate.  4.  Type 2 diabetes mellitus.  Continue metformin.  5.  Adnexal cysts.  Follow as an outpatient.  6.  Chronic back pain.  Avoid further NSAIDs, continue duloxetine, mirtazapine, pregabalin and tizanidine. As needed baclofen.   Discharge Diagnoses:  Principal Problem:   AKI (acute kidney injury) (HCC) Active Problems:   Essential hypertension   Chronic back pain   Nausea vomiting and diarrhea   Anemia    Discharge Instructions   Allergies as of 10/19/2020       Reactions   Alpha-gal Anaphylaxis   Beef-derived Products    anaphylaxes   Glycerol, Iodinated Anaphylaxis   anaphylaxes   Milk-related Compounds Anaphylaxis   Cheese, whey protein, gelatin   Pork-derived Products    anaplaxis        Medication List     STOP taking these medications    diclofenac 75 MG EC tablet Commonly known as: VOLTAREN   ketorolac 30 MG/ML injection Commonly known as: TORADOL  TAKE these medications    acetaminophen 325 MG tablet Commonly known as: TYLENOL Take 2 tablets (650 mg total) by mouth every 6 (six) hours as needed (pain).   atenolol 25 MG tablet Commonly known as: TENORMIN TAKE 1 TABLET BY MOUTH EVERY  DAY   atorvastatin 10 MG tablet Commonly known as: LIPITOR TAKE 1 TABLET BY MOUTH EVERY DAY   b complex vitamins tablet Take 1 tablet by mouth daily.   baclofen 10 MG tablet Commonly known as: LIORESAL TAKE 1 TABLET BY MOUTH THREE TIMES A DAY AS NEEDED FOR MUSCLE SPASM   botulinum toxin Type A 100 units Solr injection Commonly known as: BOTOX Inject 100 Units into the muscle every 3 (three) months. Every 12 weeks at [facility]   cetirizine 10 MG tablet Commonly known as: ZYRTEC Take 10 mg by mouth daily. Reported on 07/13/2015   DULoxetine 30 MG capsule Commonly known as: Cymbalta Take 1 capsule (30 mg total) by mouth daily. Take daily in addition to 60 mg daily for 90 mg daily   DULoxetine 60 MG capsule Commonly known as: CYMBALTA TAKE 1 CAPSULE BY MOUTH EVERY DAY. WILL NEED A FOLLOW UP APPOINTMENT FOR ADDITIONAL REFILLS   fenofibrate 145 MG tablet Commonly known as: TRICOR TAKE 1 TABLET (  TOTAL) BY MOUTH EVERY DAY. PLEASE SCHEDULE FOLLOW UP FOR FUTURE REFILLS   ferrous sulfate 325 (65 FE) MG tablet Take 1 tablet (325 mg total) by mouth daily with breakfast. Start taking on: October 20, 2020   hyoscyamine 0.125 MG SL tablet Commonly known as: LEVSIN SL Place 1 tablet (0.125 mg total) under the tongue every 6 (six) hours as needed.   MELATONIN PO Take 10 mg by mouth daily.   metFORMIN 1000 MG tablet Commonly known as: GLUCOPHAGE TAKE 1 TABLET BY MOUTH TWICE A DAY WITH MEALS   mirtazapine 45 MG tablet Commonly known as: REMERON TAKE 1 TABLET BY MOUTH EVERYDAY AT BEDTIME   multivitamin tablet Take 1 tablet by mouth daily.   nitrofurantoin (macrocrystal-monohydrate) 100 MG capsule Commonly known as: MACROBID TAKE 1 CAPSULE BY MOUTH ONCE FOR 1 DOSE. WITH INTERCOURSE   norethindrone-ethinyl estradiol 1-20 MG-MCG tablet Commonly known as: Junel 1/20 Take 1 tablet by mouth daily.   omeprazole 40 MG capsule Commonly known as: PRILOSEC Take 1 capsule (40  mg total) by mouth daily.   pregabalin 150 MG capsule Commonly known as: LYRICA TAKE 1 CAPSULE BY MOUTH TWICE A DAY   PROBIOTIC-10 PO Take 1 capsule by mouth daily.   tiZANidine 4 MG tablet Commonly known as: ZANAFLEX TAKE 1 TABLET BY MOUTH EVERY 8 HOURS AS NEEDED FOR MUSCLE SPASM   valACYclovir 1000 MG tablet Commonly known as: VALTREX TAKE 1 TABLET DAILY. INCREASE TO TWICE A DAY WITH OUTBREAKS        Allergies  Allergen Reactions   Alpha-Gal Anaphylaxis   Beef-Derived Products     anaphylaxes    Glycerol, Iodinated Anaphylaxis    anaphylaxes    Milk-Related Compounds Anaphylaxis    Cheese, whey protein, gelatin   Pork-Derived Products     anaplaxis     Procedures/Studies: DG Chest Port 1 View  Result Date: 10/17/2020 CLINICAL DATA:  Sepsis. EXAM: PORTABLE CHEST 1 VIEW COMPARISON:  February 06, 2009. FINDINGS: The heart size and mediastinal contours are within normal limits. Both lungs are clear. The visualized skeletal structures are unremarkable. IMPRESSION: No active disease. Electronically Signed   By: Lupita Raider M.D.   On: 10/17/2020 15:25  CT Renal Stone Study  Result Date: 10/17/2020 CLINICAL DATA:  Pain; ER nurse note states bilateral flank pain EXAM: CT ABDOMEN AND PELVIS WITHOUT CONTRAST TECHNIQUE: Multidetector CT imaging of the abdomen and pelvis was performed following the standard protocol without IV contrast. COMPARISON:  None. FINDINGS: Lower chest: No acute abnormality. Hepatobiliary: Decreased hepatic attenuation reflecting steatosis. There is a 2 cm rim calcified gallstone. No biliary dilatation Pancreas: Unremarkable. Spleen: Unremarkable. Adrenals/Urinary Tract: Adrenals are unremarkable. Kidneys and bladder are unremarkable. Stomach/Bowel: Stomach is within normal limits. Bowel is normal in caliber. Normal appendix. Vascular/Lymphatic: Minimal aortic atherosclerosis. No enlarged lymph nodes. Reproductive: Uterus is unremarkable. There is a 5 cm  low-density right adnexal lesion. Other: No free fluid.  Abdominal wall is unremarkable. Musculoskeletal: Lower lumbar degenerative changes. IMPRESSION: No urinary tract calculi. Cholelithiasis. Hepatic steatosis. 5 cm fluid density right adnexal lesion. No follow-up imaging recommended. Note: This recommendation does not apply to premenarchal patients and to those with increased risk (genetic, family history, elevated tumor markers or other high-risk factors) of ovarian cancer. Reference: JACR 2020 Feb; 17(2):248-254 Electronically Signed   By: Guadlupe Spanish M.D.   On: 10/17/2020 16:52       Subjective: Patient is feeling better, no nausea or vomiting, no further diarrhea, no spasms   Discharge Exam: Vitals:   10/18/20 2133 10/19/20 0530  BP: 118/73 118/66  Pulse: 97 86  Resp: 15 15  Temp: 98.5 F (36.9 C) 98.1 F (36.7 C)  SpO2: 98% 98%   Vitals:   10/18/20 0508 10/18/20 1325 10/18/20 2133 10/19/20 0530  BP: (!) 110/58 124/71 118/73 118/66  Pulse: 88 94 97 86  Resp: 20 18 15 15   Temp: 98 F (36.7 C) 97.9 F (36.6 C) 98.5 F (36.9 C) 98.1 F (36.7 C)  TempSrc: Oral  Oral Oral  SpO2: 100% 99% 98% 98%  Weight:      Height:        General: Not in pain or dyspnea.  Neurology: Awake and alert, non focal  E ENT: no pallor, no icterus, oral mucosa moist Cardiovascular: No JVD. S1-S2 present, rhythmic, no gallops, rubs, or murmurs. No lower extremity edema. Pulmonary: positive breath sounds bilaterally, with no wheezing,  Gastrointestinal. Abdomen soft and non tender Skin. No rashes Musculoskeletal: no joint deformities   The results of significant diagnostics from this hospitalization (including imaging, microbiology, ancillary and laboratory) are listed below for reference.     Microbiology: Recent Results (from the past 240 hour(s))  Blood culture (routine single)     Status: None (Preliminary result)   Collection Time: 10/17/20  2:52 PM   Specimen: Right Antecubital;  Blood  Result Value Ref Range Status   Specimen Description   Final    RIGHT ANTECUBITAL Performed at [facility], [address] [place], [place]    Special Requests   Final    BOTTLES DRAWN AEROBIC AND ANAEROBIC Blood Culture adequate volume   Culture   Final    NO GROWTH < 24 HOURS Performed at [facility], [address] [place], [place]    Report Status PENDING  Incomplete  Culture, blood (single)     Status: None (Preliminary result)   Collection Time: 10/17/20  3:20 PM   Specimen: BLOOD  Result Value Ref Range Status   Specimen Description BLOOD LEFT ANTECUBITAL  Final   Special Requests   Final    BOTTLES DRAWN AEROBIC AND ANAEROBIC Blood Culture adequate volume   Culture  Final    NO GROWTH < 24 HOURS Performed at [facility], [address], [place], [place] [address]    Report Status PENDING  Incomplete  Urine Culture     Status: Abnormal   Collection Time: 10/17/20  3:24 PM   Specimen: Urine, Clean Catch  Result Value Ref Range Status   Specimen Description   Final    URINE, CLEAN CATCH Performed at [facility], [address] [place], [place] [address]    Special Requests   Final    NONE Performed at [facility], [address], [place], [place] [address]    Culture (A)  Final    <10,000 COLONIES/mL INSIGNIFICANT GROWTH Performed at [facility], [address] [place], [place] [address]    Report Status 10/19/2020 FINAL  Final  Resp Panel by RT-PCR (Flu A&B, Covid) Urine, Clean Catch     Status: None   Collection Time: 10/17/20  3:24 PM   Specimen: Urine, Clean Catch; Nasopharyngeal(NP) swabs in vial transport medium  Result Value Ref Range Status   SARS Coronavirus 2 by RT PCR NEGATIVE NEGATIVE Final    Comment: (NOTE) SARS-CoV-2 target nucleic acids are NOT DETECTED.  The SARS-CoV-2 RNA is generally detectable in upper respiratory specimens  during the acute phase of infection. The lowest concentration of SARS-CoV-2 viral copies this assay can detect is 138 copies/mL. A negative result does not preclude SARS-Cov-2 infection and should not be used as the sole basis for treatment or other patient management decisions. A negative result may occur with  improper specimen collection/handling, submission of specimen other than nasopharyngeal swab, presence of viral mutation(s) within the areas targeted by this assay, and inadequate number of viral copies(<138 copies/mL). A negative result must be combined with clinical observations, patient history, and epidemiological information. The expected result is Negative.  Fact Sheet for Patients:  BloggerCourse.com  Fact Sheet for Healthcare Providers:  SeriousBroker.it  This test is no t yet approved or cleared by the [place] FDA and  has been authorized for detection and/or diagnosis of SARS-CoV-2 by FDA under an Emergency Use Authorization (EUA). This EUA will remain  in effect (meaning this test can be used) for the duration of the COVID-19 declaration under Section 564(b)(1) of the Act, 21 U.S.C.section 360bbb-3(b)(1), unless the authorization is terminated  or revoked sooner.       Influenza A by PCR NEGATIVE NEGATIVE Final   Influenza B by PCR NEGATIVE NEGATIVE Final    Comment: (NOTE) The Xpert Xpress SARS-CoV-2/FLU/RSV plus assay is intended as an aid in the diagnosis of influenza from Nasopharyngeal swab specimens and should not be used as a sole basis for treatment. Nasal washings and aspirates are unacceptable for Xpert Xpress SARS-CoV-2/FLU/RSV testing.  Fact Sheet for Patients: BloggerCourse.com  Fact Sheet for Healthcare Providers: SeriousBroker.it  This test is not yet approved or cleared by the [place] FDA and has been authorized for detection  and/or diagnosis of SARS-CoV-2 by FDA under an Emergency Use Authorization (EUA). This EUA will remain in effect (meaning this test can be used) for the duration of the COVID-19 declaration under Section 564(b)(1) of the Act, 21 U.S.C. section 360bbb-3(b)(1), unless the authorization is terminated or revoked.  Performed at [facility], [address] [place], [place] [address]      Labs: BNP (last 3 results) No results for input(s): BNP in the last 8760 hours. Basic Metabolic Panel: Recent Labs  Lab 10/17/20 1524  10/18/20 0330 10/19/20 0339  NA 136 141 140  K 4.7 4.8 4.6  CL 104 110 113*  CO2 GLUCOSE 87 111* 125*  BUN 26* 24* 18  CREATININE 3.77* 2.95* 2.02*  CALCIUM 9.5 9.3 8.6*  MG  --   --  1.9   Liver Function Tests: Recent Labs  Lab 10/17/20 1524  AST 20  ALT 20  ALKPHOS 28*  BILITOT 0.3  PROT 6.7  ALBUMIN 4.1   No results for input(s): LIPASE, AMYLASE in the last 168 hours. No results for input(s): AMMONIA in the last 168 hours. CBC: Recent Labs  Lab 10/17/20 1524 10/18/20 0330  WBC 8.8 9.2  NEUTROABS 5.6  --   HGB 9.1* 9.5*  HCT 28.4* 30.8*  MCV 78.0* 79.8*  PLT 271 273   Cardiac Enzymes: No results for input(s): CKTOTAL, CKMB, CKMBINDEX, TROPONINI in the last 168 hours. BNP: Invalid input(s): POCBNP CBG: No results for input(s): GLUCAP in the last 168 hours. D-Dimer No results for input(s): DDIMER in the last 72 hours. Hgb A1c No results for input(s): HGBA1C in the last 72 hours. Lipid Profile No results for input(s): CHOL, HDL, LDLCALC, TRIG, CHOLHDL, LDLDIRECT in the last 72 hours. Thyroid function studies No results for input(s): TSH, T4TOTAL, T3FREE, THYROIDAB in the last 72 hours.  Invalid input(s): FREET3 Anemia work up Recent Labs    10/18/20 0330  VITAMINB12 407  FOLATE 33.2  FERRITIN 6*  TIBC 531*  IRON 18*  RETICCTPCT 1.2   Urinalysis    Component Value Date/Time   COLORURINE YELLOW  10/18/2020 0325   APPEARANCEUR CLEAR 10/18/2020 0325   LABSPEC 1.004 (L) 10/18/2020 0325   PHURINE 6.0 10/18/2020 0325   GLUCOSEU NEGATIVE 10/18/2020 0325   GLUCOSEU NEGATIVE 09/29/2017 1336   HGBUR NEGATIVE 10/18/2020 0325   HGBUR negative 03/20/2010 1006   BILIRUBINUR NEGATIVE 10/18/2020 0325   BILIRUBINUR negative 10/28/2017 1156   KETONESUR NEGATIVE 10/18/2020 0325   PROTEINUR NEGATIVE 10/18/2020 0325   UROBILINOGEN 0.2 10/28/2017 1156   UROBILINOGEN 0.2 09/29/2017 1336   NITRITE NEGATIVE 10/18/2020 0325   LEUKOCYTESUR NEGATIVE 10/18/2020 0325   Sepsis Labs Invalid input(s): PROCALCITONIN,  WBC,  LACTICIDVEN Microbiology Recent Results (from the past 240 hour(s))  Blood culture (routine single)     Status: None (Preliminary result)   Collection Time: 10/17/20  2:52 PM   Specimen: Right Antecubital; Blood  Result Value Ref Range Status   Specimen Description   Final    RIGHT ANTECUBITAL Performed at [facility], [address], [place], [place] [address]    Special Requests   Final    BOTTLES DRAWN AEROBIC AND ANAEROBIC Blood Culture adequate volume   Culture   Final    NO GROWTH < 24 HOURS Performed at [facility], [address], [place], [place] [address]    Report Status PENDING  Incomplete  Culture, blood (single)     Status: None (Preliminary result)   Collection Time: 10/17/20  3:20 PM   Specimen: BLOOD  Result Value Ref Range Status   Specimen Description BLOOD LEFT ANTECUBITAL  Final   Special Requests   Final    BOTTLES DRAWN AEROBIC AND ANAEROBIC Blood Culture adequate volume   Culture   Final    NO GROWTH < 24 HOURS Performed at [facility], [address] [place], [place] [address]    Report Status PENDING  Incomplete  Urine Culture     Status: Abnormal  Collection Time: 10/17/20  3:24 PM   Specimen: Urine, Clean Catch  Result Value Ref Range Status   Specimen Description   Final    URINE, CLEAN CATCH Performed  at [facility], [address] [place], [place] [address]    Special Requests   Final    NONE Performed at [facility], [address] [place], [place] [address]    Culture (A)  Final    <10,000 COLONIES/mL INSIGNIFICANT GROWTH Performed at [facility], [address] [place], [place] [address]    Report Status 10/19/2020 FINAL  Final  Resp Panel by RT-PCR (Flu A&B, Covid) Urine, Clean Catch     Status: None   Collection Time: 10/17/20  3:24 PM   Specimen: Urine, Clean Catch; Nasopharyngeal(NP) swabs in vial transport medium  Result Value Ref Range Status   SARS Coronavirus 2 by RT PCR NEGATIVE NEGATIVE Final    Comment: (NOTE) SARS-CoV-2 target nucleic acids are NOT DETECTED.  The SARS-CoV-2 RNA is generally detectable in upper respiratory specimens during the acute phase of infection. The lowest concentration of SARS-CoV-2 viral copies this assay can detect is 138 copies/mL. A negative result does not preclude SARS-Cov-2 infection and should not be used as the sole basis for treatment or other patient management decisions. A negative result may occur with  improper specimen collection/handling, submission of specimen other than nasopharyngeal swab, presence of viral mutation(s) within the areas targeted by this assay, and inadequate number of viral copies(<138 copies/mL). A negative result must be combined with clinical observations, patient history, and epidemiological information. The expected result is Negative.  Fact Sheet for Patients:  BloggerCourse.com  Fact Sheet for Healthcare Providers:  SeriousBroker.it  This test is no t yet approved or cleared by the [place] FDA and  has been authorized for detection and/or diagnosis of SARS-CoV-2 by FDA under an Emergency Use Authorization (EUA). This EUA will remain  in effect (meaning this test can be used) for the  duration of the COVID-19 declaration under Section 564(b)(1) of the Act, 21 U.S.C.section 360bbb-3(b)(1), unless the authorization is terminated  or revoked sooner.       Influenza A by PCR NEGATIVE NEGATIVE Final   Influenza B by PCR NEGATIVE NEGATIVE Final    Comment: (NOTE) The Xpert Xpress SARS-CoV-2/FLU/RSV plus assay is intended as an aid in the diagnosis of influenza from Nasopharyngeal swab specimens and should not be used as a sole basis for treatment. Nasal washings and aspirates are unacceptable for Xpert Xpress SARS-CoV-2/FLU/RSV testing.  Fact Sheet for Patients: BloggerCourse.com  Fact Sheet for Healthcare Providers: SeriousBroker.it  This test is not yet approved or cleared by the [place] FDA and has been authorized for detection and/or diagnosis of SARS-CoV-2 by FDA under an Emergency Use Authorization (EUA). This EUA will remain in effect (meaning this test can be used) for the duration of the COVID-19 declaration under Section 564(b)(1) of the Act, 21 U.S.C. section 360bbb-3(b)(1), unless the authorization is terminated or revoked.  Performed at [facility], [address], [place], [place] [address]      Time coordinating discharge: 45 minutes  SIGNED:   Coralie Keens, MD  [facility] 10/19/2020, 11:29 AM

## 2020-10-19 NOTE — Progress Notes (Signed)
Discharge package printed and instructions given to pt. No question. °

## 2020-10-19 NOTE — Plan of Care (Signed)
  Problem: Pain Managment: Goal: General experience of comfort will improve Outcome: Progressing   Problem: Safety: Goal: Ability to remain free from injury will improve Outcome: Progressing   

## 2020-10-19 NOTE — Telephone Encounter (Signed)
Spoke with patient. She is currently in hospital but scheduled to be discharged today.  Hospital follow up made for Wednesday as she needed to have repeat labs done of kidney function.

## 2020-10-22 LAB — CULTURE, BLOOD (SINGLE)
Culture: NO GROWTH
Culture: NO GROWTH
Special Requests: ADEQUATE
Special Requests: ADEQUATE

## 2020-10-24 NOTE — Progress Notes (Signed)
Subjective:    Patient ID: Kayla Floyd, female    DOB: Dec 05, 1980, 40 y.o.   MRN: [id number]  HPI The patient is here for follow up of their chronic medical problems, including from the hospital    Admitted 7/26 - 7/28  Recommendations for Outpatient Follow-up and new medication changes:  Follow up with Dr. [name] in 7 to 10 days. Follow up renal function in 7 days. Discontinue non steroidal anti-inflammatory agents Added iron supplements for iron deficiency   She presented with nausea, vomiting, diarrhea and flank pain x 2 days.  VSS, no abdominal pain. BUN 26, Cr 3.77. UA neg, EKG normal. Cxr neg.    AKI:  Likely prerenal renal failure and nsaid induced kidney injury IVF GI symptoms improved On D/c BUN 18, Cr 2.0 No nsaids  Iron def anemia: Low iron  Started on iron supp  Chronic medical problems stable  She is drinking a good amount of fluids.  Her urination is good-improved.  She is fatigued.  She has increased pain all over from not being on the nsaids.  She is having more issues with her food allergies.  She does get tongue swelling at times and is having difficulty figuring out what she can eat.  She is never seen an allergist.  She was wondering if she should have an EpiPen.  She typically carries Benadryl with her and needs to take it immediately in order to get her symptoms controlled.    Medications and allergies reviewed with patient and updated if appropriate.  Patient Active Problem List   Diagnosis Date Noted   Multiple food allergies 10/25/2020   Nausea vomiting and diarrhea 10/17/2020   Iron deficiency anemia 10/17/2020   SIRS (systemic inflammatory response syndrome) (HCC) 10/17/2020   AKI (acute kidney injury) (HCC)    Myalgia 10/20/2018   Diarrhea 10/20/2018   Dysphagia 06/27/2018   Acute cystitis without hematuria 09/23/2017   Abdominal pain 09/23/2017   Gastroparesis 10/28/2016   Prediabetes 08/31/2016   Genetic testing 08/29/2016    Depression 08/28/2016   Family history of colon cancer    Family history of breast cancer    Obese 03/15/2015   Chronic migraine without aura without status migrainosus, not intractable 06/20/2014   Tear meniscus knee 06/14/2010   Dysautonomia - POTS 03/20/2010   Anxiety 07/30/2009   Essential hypertension 07/28/2009   SYNCOPE AND COLLAPSE 07/10/2009   PALPITATIONS 07/10/2009   POLYCYSTIC OVARIAN DISEASE 08/31/2008   Hypercholesterolemia with hypertriglyceridemia 08/31/2008   GERD 08/31/2008   Chronic back pain 08/31/2008    Current Outpatient Medications on File Prior to Visit  Medication Sig Dispense Refill   acetaminophen (TYLENOL) 325 MG tablet Take 2 tablets (650 mg total) by mouth every 6 (six) hours as needed (pain).     atenolol (TENORMIN) 25 MG tablet TAKE 1 TABLET BY MOUTH EVERY DAY 30 tablet 2   atorvastatin (LIPITOR) 10 MG tablet TAKE 1 TABLET BY MOUTH EVERY DAY 30 tablet 2   b complex vitamins tablet Take 1 tablet by mouth daily.     baclofen (LIORESAL) 10 MG tablet TAKE 1 TABLET BY MOUTH THREE TIMES A DAY AS NEEDED FOR MUSCLE SPASM 90 tablet 0   botulinum toxin Type A (BOTOX) 100 units SOLR injection Inject 100 Units into the muscle every 3 (three) months. Every 12 weeks at [facility] 2 vial    cetirizine (ZYRTEC) 10 MG tablet Take 10 mg by mouth daily. Reported on 07/13/2015  DULoxetine (CYMBALTA) 30 MG capsule Take 1 capsule (30 mg total) by mouth daily. Take daily in addition to 60 mg daily for 90 mg daily 90 capsule 1   DULoxetine (CYMBALTA) 60 MG capsule TAKE 1 CAPSULE BY MOUTH EVERY DAY. WILL NEED A FOLLOW UP APPOINTMENT FOR ADDITIONAL REFILLS 30 capsule 5   fenofibrate (TRICOR) 145 MG tablet TAKE 1 TABLET (145MG  TOTAL) BY MOUTH EVERY DAY. PLEASE SCHEDULE FOLLOW UP FOR FUTURE REFILLS 30 tablet 2   ferrous sulfate 325 (65 FE) MG tablet Take 1 tablet (325 mg total) by mouth daily with breakfast. 30 tablet 0   hyoscyamine (LEVSIN SL) 0.125 MG SL tablet Place 1  tablet (0.125 mg total) under the tongue every 6 (six) hours as needed. 45 tablet 1   MELATONIN PO Take 10 mg by mouth daily.     metFORMIN (GLUCOPHAGE) 1000 MG tablet TAKE 1 TABLET BY MOUTH TWICE A DAY WITH MEALS 60 tablet 2   mirtazapine (REMERON) 45 MG tablet TAKE 1 TABLET BY MOUTH EVERYDAY AT BEDTIME 30 tablet 2   Multiple Vitamin (MULTIVITAMIN) tablet Take 1 tablet by mouth daily.       nitrofurantoin, macrocrystal-monohydrate, (MACROBID) 100 MG capsule TAKE 1 CAPSULE BY MOUTH ONCE FOR 1 DOSE. WITH INTERCOURSE 30 capsule 3   norethindrone-ethinyl estradiol (JUNEL 1/20) 1-20 MG-MCG tablet Take 1 tablet by mouth daily. 63 tablet 4   omeprazole (PRILOSEC) 40 MG capsule Take 1 capsule (40 mg total) by mouth daily.     pregabalin (LYRICA) 150 MG capsule TAKE 1 CAPSULE BY MOUTH TWICE A DAY 60 capsule 2   Probiotic Product (PROBIOTIC-10 PO) Take 1 capsule by mouth daily.     tiZANidine (ZANAFLEX) 4 MG tablet TAKE 1 TABLET BY MOUTH EVERY 8 HOURS AS NEEDED FOR MUSCLE SPASM 90 tablet 1   valACYclovir (VALTREX) 1000 MG tablet TAKE 1 TABLET DAILY. INCREASE TO TWICE A DAY WITH OUTBREAKS 60 tablet 2   No current facility-administered medications on file prior to visit.    Past Medical History:  Diagnosis Date   ANXIETY    BACK PAIN, CHRONIC    DYSAUTONOMIA 2011   "POTS" , recurrent syncope - follows with cards for same   GERD    HSV (herpes simplex virus) anogenital infection 06/2015   cervix swab   HYPERLIPIDEMIA    Kidney failure    MIGRAINE HEADACHE    Palpitations    POLYCYSTIC OVARIAN DISEASE    POTS (postural orthostatic tachycardia syndrome)    Tear meniscus knee 06/14/2010   right    Past Surgical History:  Procedure Laterality Date   Cervical spine  2010   ACDF c5-6   crevical fusion   2010   evs     LUMBAR LAMINECTOMY  2008   TONSILLECTOMY AND ADENOIDECTOMY      Social History   Socioeconomic History   Marital status: Married    Spouse name: Not on file   Number of  children: Not on file   Years of education: Not on file   Highest education level: Not on file  Occupational History   Occupation: part-time Chiopractor  Tobacco Use   Smoking status: Former    Packs/day: 1.00    Years: 10.00    Pack years: 10.00    Types: Cigarettes   Smokeless tobacco: Former   Tobacco comments:    1-2 cigarettes per day  Vaping Use   Vaping Use: Never used  Substance and Sexual Activity   Alcohol use: Yes  Alcohol/week: 0.0 standard drinks    Comment: occasionally   Drug use: No   Sexual activity: Yes    Partners: Male    Birth control/protection: Pill    Comment: 1st intercourse 40 yo-More than 5 partners, married- 1 yr  Other Topics Concern   Not on file  Social History Narrative   Disability since 10/2012 due to POTS, 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[employer]      Some exercise on a regular exericse   Social Determinants of Health   Financial Resource Strain: Low Risk    Difficulty of Paying Living Expenses: Not hard at all  Food Insecurity: No Food Insecurity   Worried About Programme researcher, broadcasting/film/videounning Out of Food in the Last Year: Never true   Ran Out of Food in the Last Year: Never true  Transportation Needs: No Transportation Needs   Lack of Transportation (Medical): No   Lack of Transportation (Non-Medical): No  Physical Activity: Inactive   Days of Exercise per Week: 0 days   Minutes of Exercise per Session: 0 min  Stress: No Stress Concern Present   Feeling of Stress : Not at all  Social Connections: Not on file    Family History  Problem Relation Age of Onset   Bipolar disorder Mother    Stroke Mother        Age of 41 while on OCP   Other Father        MVA   Heart disease Father    Heart attack Father    Colon cancer Maternal Grandmother [id number]   Cancer Maternal Grandmother    Hypertension Sister    Hypertension Sister    Migraines Sister    Breast cancer Paternal Grandmother        dx in her [id number]   Cancer Paternal Uncle        ocular   Breast cancer  Paternal Aunt [id number]   Lung cancer Paternal Uncle     Review of Systems  Constitutional:  Positive for fatigue. Negative for chills and fever.  Respiratory:  Positive for cough (allergy related). Negative for shortness of breath and wheezing.   Cardiovascular:  Positive for palpitations and leg swelling. Negative for chest pain.  Gastrointestinal:  Positive for abdominal pain (mild - chronic) and nausea (mild- intermittent). Negative for vomiting.  Genitourinary:  Negative for dysuria.       Urine clear  Musculoskeletal:  Positive for arthralgias, back pain, myalgias and neck pain.  Neurological:  Positive for light-headedness and headaches.      Objective:   Vitals:   10/25/20 1534  BP: 118/74  Pulse: 90  Temp: 99 F (37.2 C)  SpO2: 96%   BP Readings from Last 3 Encounters:  10/25/20 118/74  10/19/20 130/74  05/24/19 114/74   Wt Readings from Last 3 Encounters:  10/25/20 195 lb (88.5 kg)  10/17/20 195 lb (88.5 kg)  05/24/19 196 lb (88.9 kg)   Body mass index is 32.45 kg/m.   Physical Exam    Constitutional: Appears well-developed and well-nourished. No distress.  HENT:  Head: Normocephalic and atraumatic.  Neck: Neck supple. No tracheal deviation present. No thyromegaly present.  No cervical lymphadenopathy Cardiovascular: Normal rate, regular rhythm and normal heart sounds.   No murmur heard. No carotid bruit .  No edema Pulmonary/Chest: Effort normal and breath sounds normal. No respiratory distress. No has no wheezes. No rales.  Skin: Skin is warm and dry. Not diaphoretic.  Psychiatric: Normal mood and affect. Behavior is normal.  Assessment & Plan:    See Problem List for Assessment and Plan of chronic medical problems.    This visit occurred during the SARS-CoV-2 public health emergency.  Safety protocols were in place, including screening questions prior to the visit, additional usage of staff PPE, and extensive cleaning of exam room while observing  appropriate contact time as indicated for disinfecting solutions.

## 2020-10-24 NOTE — Patient Instructions (Addendum)
  Blood work was ordered.     Medications changes include :   Epi-pen  Your prescription(s) have been submitted to your pharmacy. Please take as directed and contact our office if you believe you are having problem(s) with the medication(s).   A referral was ordered for  Allergy and the kidney doctor.     Someone from their office will call you to schedule an appointment.    Follow up in 6 months.

## 2020-10-25 ENCOUNTER — Encounter: Payer: Self-pay | Admitting: Internal Medicine

## 2020-10-25 ENCOUNTER — Ambulatory Visit (INDEPENDENT_AMBULATORY_CARE_PROVIDER_SITE_OTHER): Payer: Medicare Other | Admitting: Internal Medicine

## 2020-10-25 ENCOUNTER — Other Ambulatory Visit: Payer: Self-pay

## 2020-10-25 VITALS — BP 118/74 | HR 90 | Temp 99.0°F | Ht 65.0 in | Wt 195.0 lb

## 2020-10-25 DIAGNOSIS — I10 Essential (primary) hypertension: Secondary | ICD-10-CM

## 2020-10-25 DIAGNOSIS — N179 Acute kidney failure, unspecified: Secondary | ICD-10-CM | POA: Diagnosis not present

## 2020-10-25 DIAGNOSIS — D509 Iron deficiency anemia, unspecified: Secondary | ICD-10-CM | POA: Diagnosis not present

## 2020-10-25 DIAGNOSIS — M549 Dorsalgia, unspecified: Secondary | ICD-10-CM | POA: Diagnosis not present

## 2020-10-25 DIAGNOSIS — G8929 Other chronic pain: Secondary | ICD-10-CM

## 2020-10-25 DIAGNOSIS — Z91018 Allergy to other foods: Secondary | ICD-10-CM

## 2020-10-25 MED ORDER — EPINEPHRINE 0.3 MG/0.3ML IJ SOAJ: 0.3000 mg | INTRAMUSCULAR | 5 refills | Status: DC | PRN

## 2020-10-25 NOTE — Assessment & Plan Note (Signed)
Chronic No longer able to take any NSAIDs Continue Lyrica 150 mg twice daily Continue baclofen 10 mg 3 times daily as needed work tizanidine 4 mg 3 times daily as needed-she only takes 1 or the other Continue Cymbalta 90 mg daily

## 2020-10-25 NOTE — Assessment & Plan Note (Signed)
Chronic Placed on iron supplements Will monitor-we will not check today since it is too soon Iron deficiency may be related to dietary restrictions

## 2020-10-25 NOTE — Assessment & Plan Note (Signed)
Chronic Blood pressure well controlled Continue atenolol 25 mg daily

## 2020-10-25 NOTE — Assessment & Plan Note (Signed)
AKI related to NSAID use and dehydration She is drinking a good amount of fluids She is no longer taking any NSAIDs BMP today Referral ordered for nephrology

## 2020-10-25 NOTE — Assessment & Plan Note (Signed)
Chronic Having more reactions and worse reactions to several foods She has had some tongue swelling and I will prescribe her an EpiPen Referral ordered for allergy

## 2020-10-26 LAB — BASIC METABOLIC PANEL
BUN: 13 mg/dL (ref 6–23)
CO2: 24 mEq/L (ref 19–32)
Calcium: 9.8 mg/dL (ref 8.4–10.5)
Chloride: 106 mEq/L (ref 96–112)
Creatinine, Ser: 0.98 mg/dL (ref 0.40–1.20)
GFR: 72.05 mL/min (ref >60.00)
Glucose, Bld: 77 mg/dL (ref 70–99)
Potassium: 4 mEq/L (ref 3.5–5.1)
Sodium: 140 mEq/L (ref 135–145)

## 2020-10-27 ENCOUNTER — Encounter: Payer: Self-pay | Admitting: Internal Medicine

## 2020-10-30 ENCOUNTER — Other Ambulatory Visit: Payer: Self-pay | Admitting: Internal Medicine

## 2020-11-10 ENCOUNTER — Other Ambulatory Visit: Payer: Self-pay | Admitting: Internal Medicine

## 2020-11-10 DIAGNOSIS — A609 Anogenital herpesviral infection, unspecified: Secondary | ICD-10-CM

## 2020-11-28 ENCOUNTER — Other Ambulatory Visit: Payer: Self-pay | Admitting: Internal Medicine

## 2020-11-28 DIAGNOSIS — M549 Dorsalgia, unspecified: Secondary | ICD-10-CM

## 2020-11-28 DIAGNOSIS — G8929 Other chronic pain: Secondary | ICD-10-CM

## 2020-12-10 ENCOUNTER — Other Ambulatory Visit: Payer: Self-pay | Admitting: Internal Medicine

## 2020-12-26 ENCOUNTER — Other Ambulatory Visit: Payer: Self-pay

## 2020-12-26 ENCOUNTER — Ambulatory Visit (INDEPENDENT_AMBULATORY_CARE_PROVIDER_SITE_OTHER): Payer: Medicare Other | Admitting: Pharmacist

## 2020-12-26 DIAGNOSIS — G901 Familial dysautonomia [Riley-Day]: Secondary | ICD-10-CM

## 2020-12-26 DIAGNOSIS — F419 Anxiety disorder, unspecified: Secondary | ICD-10-CM

## 2020-12-26 DIAGNOSIS — F3289 Other specified depressive episodes: Secondary | ICD-10-CM

## 2020-12-26 DIAGNOSIS — G43709 Chronic migraine without aura, not intractable, without status migrainosus: Secondary | ICD-10-CM

## 2020-12-26 DIAGNOSIS — E782 Mixed hyperlipidemia: Secondary | ICD-10-CM | POA: Diagnosis not present

## 2020-12-26 DIAGNOSIS — I10 Essential (primary) hypertension: Secondary | ICD-10-CM

## 2020-12-26 DIAGNOSIS — R7303 Prediabetes: Secondary | ICD-10-CM

## 2020-12-26 NOTE — Progress Notes (Signed)
Chronic Care Management Pharmacy Note  12/29/2020 Name:  Kayla Floyd MRN:  [id number] DOB:  03/19/1981  Summary: -Pt reports low BP/dizziness at home since AKI, she has not been taking atenolol much and as a result HR is elevated 120-130. She has not seen cardiology. -Pt has been scheduled to see nephrology -Pt is due for lipid panel, A1c (last results 05/2019)  Recommendations/Changes made from today's visit: -Advised pt to make appt with cardiology to discuss HR/BP issues; pt may need antiarrhythmic agent  -Recommend repeat lipid panel, A1c at next PCP visit (Jan 2023)   Subjective: Kayla Floyd is an 40 y.o. year old female who is a primary patient of Burns, Claudina Lick, MD.  The CCM team was consulted for assistance with disease management and care coordination needs.    Engaged with patient by telephone for follow up visit in response to provider referral for pharmacy case management and/or care coordination services.   Consent to Services:  The patient was given information about Chronic Care Management services, agreed to services, and gave verbal consent prior to initiation of services.  Please see initial visit note for detailed documentation.   Patient Care Team: Binnie Rail, MD as PCP - General (Internal Medicine) Minus Breeding, MD (Cardiology) Marchia Bond, MD (Orthopedic Surgery) Everardo Pacific, [place] (Nurse Practitioner) Charlton Haws, [facility] as Pharmacist (Pharmacist)  Recent office visits: 10/25/20 Dr Quay Burow OV: hospital f/u; more and worse food reactions - rx'd epi pen and referred to allergy; referred to nephrology. BMP normal, AKI resolved.  Recent consult visits: [facility] visits: Medication Reconciliation was completed by comparing discharge summary, patient's EMR and Pharmacy list, and upon discussion with patient.  Admitted to the hospital on 10/17/20 due to AKI. Discharge date was 10/19/20. Discharged from [facility].    -AKI likely prerenal, NSAID-induced. Cr improved 3.7>2.0  New?Medications Started at [facility] Discharge:?? -started ferrous sulfate 325 mg due to IDA -Started Tylenol to replace NSAIDs  Medications Discontinued at Hospital Discharge: -Stopped diclofenac due to AKI  Medications that remain the same after Hospital Discharge:??  -All other medications will remain the same.     Objective:  Lab Results  Component Value Date   CREATININE 0.98 10/25/2020   BUN 13 10/25/2020   GFR 72.05 10/25/2020   GFRNONAA 31 (L) 10/19/2020   GFRAA >90 04/23/2011   NA 140 10/25/2020   K 4.0 10/25/2020   CALCIUM 9.8 10/25/2020   CO2 24 10/25/2020   GLUCOSE 77 10/25/2020    Lab Results  Component Value Date/Time   HGBA1C 5.7 05/24/2019 04:21 PM   HGBA1C 5.7 12/23/2017 04:05 PM   GFR 72.05 10/25/2020 04:23 PM   GFR 83.29 05/24/2019 04:21 PM    Last diabetic Eye exam: No results found for: HMDIABEYEEXA  Last diabetic Foot exam: No results found for: HMDIABFOOTEX   Lab Results  Component Value Date   CHOL 235 (H) 05/24/2019   HDL 30.80 (L) 05/24/2019   LDLCALC 92 04/07/2013   LDLDIRECT 167.0 05/24/2019   TRIG 274.0 (H) 05/24/2019   CHOLHDL 8 05/24/2019    Hepatic Function Latest Ref Rng & Units 10/17/2020 05/24/2019 12/23/2017  Total Protein 6.5 - 8.1 g/dL 6.7 7.8 7.9  Albumin 3.5 - 5.0 g/dL 4.1 4.5 4.5  AST 15 - 41 U/L 20 28 45(H)  ALT 0 - 44 U/L 20 34 31  Alk Phosphatase 38 - 126 U/L 28(L) 39 36(L)  Total Bilirubin 0.3 - 1.2 mg/dL 0.3 0.4  0.3  Bilirubin, Direct 0.0 - 0.3 mg/dL - - -    Lab Results  Component Value Date/Time   TSH 1.47 08/08/2017 12:39 PM   TSH 1.17 08/28/2016 04:00 PM    CBC Latest Ref Rng & Units 10/18/2020 10/17/2020 05/24/2019  WBC 4.0 - 10.5 K/uL 9.2 8.8 6.4  Hemoglobin 12.0 - 15.0 g/dL 9.5(L) 9.1(L) 10.9(L)  Hematocrit 36.0 - 46.0 % 30.8(L) 28.4(L) 32.9(L)  Platelets 150 - 400 K/uL 273 271 349.0    No results found for: VD25OH  Clinical ASCVD: No  The  10-year ASCVD risk score (Arnett DK, et al., 2019) is: 2.7%   Values used to calculate the score:     Age: 40 years     Sex: Female     Is Non-Hispanic African American: No     Diabetic: No     Tobacco smoker: No     Systolic Blood Pressure: 347 mmHg     Is BP treated: Yes     HDL Cholesterol: 30.8 mg/dL     Total Cholesterol: 235 mg/dL    Depression screen [facility] 2/9 06/21/2020 01/12/2019 06/20/2014  Decreased Interest 0 0 0  Down, Depressed, Hopeless 0 1 0  PHQ - 2 Score 0 1 0  Tired, decreased energy - 2 -  Change in appetite - 0 -  Feeling bad or failure about yourself  - 0 -  Trouble concentrating - 0 -  Moving slowly or fidgety/restless - 0 -  Suicidal thoughts - 0 -  Difficult doing work/chores - Not difficult at all -     Social History   Tobacco Use  Smoking Status Former   Packs/day: 1.00   Years: 10.00   Pack years: 10.00   Types: Cigarettes  Smokeless Tobacco Former  Tobacco Comments   1-2 cigarettes per day   BP Readings from Last 3 Encounters:  10/25/20 118/74  10/19/20 130/74  05/24/19 114/74   Pulse Readings from Last 3 Encounters:  10/25/20 90  10/19/20 88  05/24/19 94   Wt Readings from Last 3 Encounters:  10/25/20 195 lb (88.5 kg)  10/17/20 195 lb (88.5 kg)  05/24/19 196 lb (88.9 kg)   BMI Readings from Last 3 Encounters:  10/25/20 32.45 kg/m  10/17/20 32.45 kg/m  05/24/19 33.64 kg/m    Assessment/Interventions: Review of patient past medical history, allergies, medications, health status, including review of consultants reports, laboratory and other test data, was performed as part of comprehensive evaluation and provision of chronic care management services.   SDOH:  (Social Determinants of Health) assessments and interventions performed: Yes  SDOH Screenings   Alcohol Screen: Low Risk    Last Alcohol Screening Score (AUDIT): 1  Depression (PHQ2-9): Low Risk    PHQ-2 Score: 0  Financial Resource Strain: Low Risk    Difficulty of  Paying Living Expenses: Not hard at all  Food Insecurity: No Food Insecurity   Worried About Charity fundraiser in the Last Year: Never true   Ran Out of Food in the Last Year: Never true  Housing: Low Risk    Last Housing Risk Score: 0  Physical Activity: Inactive   Days of Exercise per Week: 0 days   Minutes of Exercise per Session: 0 min  Social Connections: Not on file  Stress: No Stress Concern Present   Feeling of Stress : Not at all  Tobacco Use: Medium Risk   Smoking Tobacco Use: Former   Smokeless Tobacco Use: Former  Transport planner Needs:  No Transportation Needs   Lack of Transportation (Medical): No   Lack of Transportation (Non-Medical): No    CCM Care Plan  Allergies  Allergen Reactions   Alpha-Gal Anaphylaxis   Beef-Derived Products     anaphylaxes    Glycerol, Iodinated Anaphylaxis    anaphylaxes    Milk-Related Compounds Anaphylaxis    Cheese, whey protein, gelatin   Pork-Derived Products     anaplaxis    Medications Reviewed Today     Reviewed by Charlton Haws, [facility] (Pharmacist) on 12/26/20 at [place] List Status: <None>   Medication Order Taking? Sig Documenting Provider Last Dose Status Informant  acetaminophen (TYLENOL) 325 MG tablet [id number] Yes Take 2 tablets (650 mg total) by mouth every 6 (six) hours as needed (pain). Arrien, Jimmy Picket, MD Taking Active   atenolol (TENORMIN) 25 MG tablet [id number] Yes TAKE 1 TABLET BY MOUTH EVERY DAY  Patient taking differently: Take 25 mg by mouth daily. If BP >130/80   Binnie Rail, MD Taking Active   atorvastatin (LIPITOR) 10 MG tablet [id number] Yes TAKE 1 TABLET BY MOUTH EVERY DAY Burns, Claudina Lick, MD Taking Active Self  b complex vitamins tablet [id number] Yes Take 1 tablet by mouth daily. [provider] Taking Active Self  baclofen (LIORESAL) 10 MG tablet [id number] Yes TAKE 1 TABLET BY MOUTH THREE TIMES A DAY AS NEEDED FOR MUSCLE SPASM Burns, Claudina Lick, MD Taking Active   botulinum  toxin Type A (BOTOX) 100 units SOLR injection [id number] Yes Inject 100 Units into the muscle every 3 (three) months. Every 12 weeks at [facility] Rowe Clack, MD Taking Active Self  cetirizine (ZYRTEC) 10 MG tablet [id number] Yes Take 10 mg by mouth daily. Reported on 07/13/2015 [provider] Taking Active Self  DULoxetine (CYMBALTA) 30 MG capsule [id number] Yes TAKE 1 CAPSULE BY MOUTH DAILY. TAKE DAILY IN ADDITION TO 60 MG DAILY FOR 90 MG DAILY Burns, Claudina Lick, MD Taking Active   DULoxetine (CYMBALTA) 60 MG capsule [id number] Yes TAKE 1 CAPSULE BY MOUTH EVERY DAY. WILL NEED A FOLLOW UP APPOINTMENT FOR ADDITIONAL REFILLS Binnie Rail, MD Taking Active Self  EPINEPHrine 0.3 mg/0.3 mL IJ SOAJ injection [id number] Yes Inject 0.3 mg into the muscle as needed for anaphylaxis. Binnie Rail, MD Taking Active   fenofibrate (TRICOR) 145 MG tablet [id number] Yes TAKE 1 TABLET (145MG TOTAL) BY MOUTH EVERY DAY. PLEASE SCHEDULE FOLLOW UP FOR FUTURE REFILLS Binnie Rail, MD Taking Active   ferrous sulfate 325 (65 FE) MG tablet [id number]  Take 1 tablet (325 mg total) by mouth daily with breakfast. Tawni Millers, MD  Expired 11/19/20 2359   hyoscyamine (LEVSIN SL) 0.125 MG SL tablet [id number] Yes PLACE 1 TABLET (0.125 MG TOTAL) UNDER THE TONGUE EVERY 6 (SIX) HOURS AS NEEDED. Binnie Rail, MD Taking Active   MELATONIN PO [id number] Yes Take 10 mg by mouth daily. [provider] Taking Active Self  metFORMIN (GLUCOPHAGE) 1000 MG tablet [id number] Yes TAKE 1 TABLET BY MOUTH TWICE A DAY WITH MEALS Burns, Claudina Lick, MD Taking Active Self  mirtazapine (REMERON) 45 MG tablet [id number] Yes TAKE 1 TABLET BY MOUTH EVERYDAY AT BEDTIME Binnie Rail, MD Taking Active Self  Multiple Vitamin (MULTIVITAMIN) tablet [id number] Yes Take 1 tablet by mouth daily.   [provider] Taking Active Self  nitrofurantoin, macrocrystal-monohydrate, (MACROBID) 100 MG capsule [id number] Yes TAKE 1  CAPSULE BY MOUTH ONCE FOR 1 DOSE. WITH INTERCOURSE Burns,  Claudina Lick, MD Taking Active Self  norethindrone-ethinyl estradiol (JUNEL 1/20) 1-20 MG-MCG tablet [id number] Yes Take 1 tablet by mouth daily. Fontaine, Belinda Block, MD Taking Active Self           Med Note Alesia Banda, [place]fin Ganja   Wed Oct 18, 2020 10:22 AM) Period was off so pt skipped this month  omeprazole (PRILOSEC) 40 MG capsule [id number] Yes Take 1 capsule (40 mg total) by mouth daily. Arrien, Jimmy Picket, MD Taking Active   pregabalin (LYRICA) 150 MG capsule [id number] Yes TAKE 1 CAPSULE BY MOUTH TWICE A DAY Burns, Claudina Lick, MD Taking Active   Probiotic Product (PROBIOTIC-10 PO) [id number] Yes Take 1 capsule by mouth daily. [provider] Taking Active Self  tiZANidine (ZANAFLEX) 4 MG tablet [id number] Yes TAKE 1 TABLET BY MOUTH EVERY 8 HOURS AS NEEDED FOR MUSCLE SPASM Burns, Claudina Lick, MD Taking Active   valACYclovir (VALTREX) 1000 MG tablet [id number] Yes TAKE 1 TABLET DAILY. INCREASE TO TWICE A DAY WITH OUTBREAKS Binnie Rail, MD Taking Active             Patient Active Problem List   Diagnosis Date Noted   Multiple food allergies 10/25/2020   Nausea vomiting and diarrhea 10/17/2020   Iron deficiency anemia 10/17/2020   SIRS (systemic inflammatory response syndrome) ([place]) 10/17/2020   AKI (acute kidney injury) ([place])    Myalgia 10/20/2018   Diarrhea 10/20/2018   Dysphagia 06/27/2018   Acute cystitis without hematuria 09/23/2017   Abdominal pain 09/23/2017   Gastroparesis 10/28/2016   Prediabetes 08/31/2016   Genetic testing 08/29/2016   Depression 08/28/2016   Family history of colon cancer    Family history of breast cancer    Obese 03/15/2015   Chronic migraine without aura without status migrainosus, not intractable 06/20/2014   Tear meniscus knee 06/14/2010   Dysautonomia - POTS 03/20/2010   Anxiety 07/30/2009   Essential hypertension 07/28/2009   SYNCOPE AND COLLAPSE 07/10/2009   PALPITATIONS 07/10/2009    POLYCYSTIC OVARIAN DISEASE 08/31/2008   Hypercholesterolemia with hypertriglyceridemia 08/31/2008   GERD 08/31/2008   Chronic back pain 08/31/2008    Immunization History  Administered Date(s) Administered   DTP 03/31/1980, 05/02/1980, 06/07/1980, 07/05/1981, 11/09/1984   Influenza Whole 12/23/2008   Influenza,inj,Quad PF,6+ Mos 12/23/2017   MMR 04/25/1981, 08/26/2003   Moderna Sars-Covid-2 Vaccination 07/06/2019, 08/03/2019   OPV 03/31/1980, 06/07/1980, 08/09/1980, 07/05/1981, 11/09/1984   Td 11/11/1994, 03/25/2008   Tdap 12/23/2017    Conditions to be addressed/monitored:  Hypertension, Hyperlipidemia, Depression, and Anxiety, Prediabetes, PCOS, migraine, chronic back pain  Care Plan : [place]  Updates made by Charlton Haws, [place] since 12/29/2020 12:00 AM     Problem: Hypertension, Hyperlipidemia, Depression, and Anxiety, Prediabetes, PCOS, migraine, chronic back pain   Priority: High     Long-Range Goal: Patient-Specific Goal   Start Date: 12/29/2020  Expected End Date: 12/29/2021  This Visit's Progress: On track  Priority: High  Note:   Current Barriers:  Unable to independently monitor therapeutic efficacy  Pharmacist Clinical Goal(s):  Patient will achieve adherence to monitoring guidelines and medication adherence to achieve therapeutic efficacy through collaboration with PharmD and provider.   Interventions: 1:1 collaboration with Binnie Rail, MD regarding development and update of comprehensive plan of care as evidenced by provider attestation and co-signature Inter-disciplinary care team collaboration (see longitudinal plan of care) Comprehensive medication review performed; medication list updated in electronic medical record  Hypertension/Dysautonomia (POTS)    BP goal is:  <  130/80  Patient checks BP at home when feeling symptomatic Patient home BP readings are ranging: 120/70 w/o atenolol, drops to < 100/60 w/ atenolol HR  120-130s off atenolol, 70-80 on atenolol   Patient has failed these meds in the past: n/a Patient is currently controlled on the following medications:  Atenolol 25 mg daily   We discussed: Pt has not been able to take atenolol due to low BP/dizziness recently; pt reports HR has been elevated, often running 120-130; pt has not seen cardiology recently; discussed antiarrythmic agent may be required - need to discuss with cardiology   Plan: Advised pt to make appt with cardiology to discuss HR/BP issues    Hyperlipidemia  Patient has failed these meds in past: n/a Patient is currently uncontrolled on the following medications:  Atorvastatin 10 mg daily AM Fenofibrate 145 mg daily   We discussed:  prior to most recent lipid panel pt had stopped statin and fibrate due to fertility treatments. She has since restarted; pt endorses strong family history of dyslipidemia, she has had cholesterol issues since 66. -pt is due for repeat lipid panel   Plan: Continue current medications Recommend repeat lipid panel with next PCP visit   Prediabetes  Patient has failed these meds in past: n/a Patient is currently controlled on the following medications: Metformin 1000 mg BID (PCOS)   We discussed: diet and exercise extensively; benefits of metformin in prediabetes; risk of metabolic syndrome; repeat A1c is due   Plan: Continue current medications; repeat A1c with next PCP visit   PCOS    Patient has failed these meds in past: n/a Patient is currently controlled on the following medications:  Metformin 1000 mg BID Junel (norethindrone-ethinyl estradiol 1/20) 1 daily   We discussed:  Patient is satisfied with current regimen and denies issues   Plan: Continue current medications   Migraine    Patient has failed these meds in past: topiramate Patient is currently controlled on the following medications:  Botox 100 unit q3 months  Atenolol 25 mg daily Promethazine 12.5 mg PRN   We  discussed:  Pt reports Botox has helped her significantly - migraines down to 2-3 per month from near daily. She does report migraines have been occurring somewhat more often since her hospitalization, possible due to taking atenolol less   Plan: Continue current medications   Chronic back pain    Patient has failed these meds in past: n/a Patient is currently controlled on the following medications:  Baclofen 10 mg TID prn - typically BID Pregabalin 150 mg BID Tizanidine 4 mg q8h PRN - breakthrough, typically at night Tylenol 500 mg BID Duloxetine 60 + 30 mg mg daily CBD oil   We discussed: Pt is no longer taking NSAIDs since AKI; she reports pain is not as well controlled but overall tolerable   Plan: Continue current medications    Depression/Anxiety    Patient has failed these meds in past: n/a Patient is currently controlled on the following medications:  Duloxetine 60 + 30 mg daily Mirtazapine 45 mg HS Alprazolam - sparingly   We discussed:  Pt reports doing well with current doses   Plan: Continue current medications  Health Maintenance    Patient is currently controlled on the following medications:  Melatonin  Vitamin B complex Multivitamin Cetirizine 10 mg daily  Probiotic  [employer] supplements   We discussed: Pt takes several supplements to help with chronic pain, including pain rollers with essential oils. She reports supplements have  helped her greatly and she has been able to take fewer muscle relaxers   Plan: Continue current medications  Patient Goals/Self-Care Activities Patient will:  - take medications as prescribed focus on medication adherence by pill packs check blood pressure daily, document, and provide at future appointments -make appt with cardiology to discuss HR/BP issues -schedule annual PCP visit - plan for lipid panel, A1c and other labs      Medication Assistance: None required.  Patient affirms current coverage meets  needs.  Compliance/Adherence/Medication fill history: Care Gaps: Pap smear (05/04/18)  Star-Rating Drugs: None  Patient's preferred pharmacy is:  [id number] [place], [place] - [address] AT [facility] [address] [place] [address] Phone: [phone] Fax: [phone]  Uses pill box? Yes - [facility] pill packs Pt endorses 100% compliance  We discussed: Current pharmacy is preferred with insurance plan and patient is satisfied with pharmacy services Patient decided to: Continue current medication management strategy  Care Plan and Follow Up Patient Decision:  Patient agrees to Care Plan and Follow-up.  Plan: Telephone follow up appointment with care management team member scheduled for:  5 months  Charlene Brooke, PharmD, [place], CPP Clinical Pharmacist [facility] [phone]

## 2020-12-27 DIAGNOSIS — G8929 Other chronic pain: Secondary | ICD-10-CM | POA: Diagnosis not present

## 2020-12-27 DIAGNOSIS — K3184 Gastroparesis: Secondary | ICD-10-CM | POA: Diagnosis not present

## 2020-12-27 DIAGNOSIS — G90A Postural orthostatic tachycardia syndrome (POTS): Secondary | ICD-10-CM | POA: Diagnosis not present

## 2020-12-27 DIAGNOSIS — N179 Acute kidney failure, unspecified: Secondary | ICD-10-CM | POA: Diagnosis not present

## 2020-12-28 DIAGNOSIS — G43719 Chronic migraine without aura, intractable, without status migrainosus: Secondary | ICD-10-CM | POA: Diagnosis not present

## 2020-12-29 NOTE — Patient Instructions (Addendum)
Visit Information  Phone number for Pharmacist: [phone]   Goals Addressed             This Visit's Progress    Manage My Medicine       Timeframe:  Long-Range Goal Priority:  High Start Date:     12/29/20                        Expected End Date:      12/29/21                 Follow Up Date Jan 2023   - call for medicine refill 2 or 3 days before it runs out - call if I am sick and can't take my medicine - keep a list of all the medicines I take; vitamins and herbals too  -make appt with cardiology to discuss HR/BP issues   Why is this important?   These steps will help you keep on track with your medicines.   Notes:         Care Plan : CCM Pharmacy Care Plan  Updates made by Kayla Floyd, RPH since 12/29/2020 12:00 AM     Problem: Hypertension, Hyperlipidemia, Depression, and Anxiety, Prediabetes, PCOS, migraine, chronic back pain   Priority: High     Long-Range Goal: Patient-Specific Goal   Start Date: 12/29/2020  Expected End Date: 12/29/2021  This Visit's Progress: On track  Priority: High  Note:   Current Barriers:  Unable to independently monitor therapeutic efficacy  Pharmacist Clinical Goal(s):  Patient will achieve adherence to monitoring guidelines and medication adherence to achieve therapeutic efficacy through collaboration with PharmD and provider.   Interventions: 1:1 collaboration with Pincus Sanes, MD regarding development and update of comprehensive plan of care as evidenced by provider attestation and co-signature Inter-disciplinary care team collaboration (see longitudinal plan of care) Comprehensive medication review performed; medication list updated in electronic medical record  Hypertension/Dysautonomia (POTS)    BP goal is:  <130/80  Patient checks BP at home when feeling symptomatic Patient home BP readings are ranging: 120/70 w/o atenolol, drops to < 100/60 w/ atenolol HR 120-130s off atenolol, 70-80 on atenolol    Patient has failed these meds in the past: n/a Patient is currently controlled on the following medications:  Atenolol 25 mg daily   We discussed: Pt has not been able to take atenolol due to low BP/dizziness recently; pt reports HR has been elevated, often running 120-130; pt has not seen cardiology recently; discussed antiarrythmic agent may be required - need to discuss with cardiology   Plan: Advised pt to make appt with cardiology to discuss HR/BP issues    Hyperlipidemia  Patient has failed these meds in past: n/a Patient is currently uncontrolled on the following medications:  Atorvastatin 10 mg daily AM Fenofibrate 145 mg daily   We discussed:  prior to most recent lipid panel pt had stopped statin and fibrate due to fertility treatments. She has since restarted; pt endorses strong family history of dyslipidemia, she has had cholesterol issues since 65. -pt is due for repeat lipid panel   Plan: Continue current medications Recommend repeat lipid panel with next PCP visit   Prediabetes  Patient has failed these meds in past: n/a Patient is currently controlled on the following medications: Metformin 1000 mg BID (PCOS)   We discussed: diet and exercise extensively; benefits of metformin in prediabetes; risk of metabolic syndrome; repeat A1c is due   Plan:  Continue current medications; repeat A1c with next PCP visit   PCOS    Patient has failed these meds in past: n/a Patient is currently controlled on the following medications:  Metformin 1000 mg BID Junel (norethindrone-ethinyl estradiol 1/20) 1 daily   We discussed:  Patient is satisfied with current regimen and denies issues   Plan: Continue current medications   Migraine    Patient has failed these meds in past: topiramate Patient is currently controlled on the following medications:  Botox 100 unit q3 months  Atenolol 25 mg daily Promethazine 12.5 mg PRN   We discussed:  Pt reports Botox has helped her  significantly - migraines down to 2-3 per month from near daily. She does report migraines have been occurring somewhat more often since her hospitalization, possible due to taking atenolol less   Plan: Continue current medications   Chronic back pain    Patient has failed these meds in past: n/a Patient is currently controlled on the following medications:  Baclofen 10 mg TID prn - typically BID Pregabalin 150 mg BID Tizanidine 4 mg q8h PRN - breakthrough, typically at night Tylenol 500 mg BID Duloxetine 60 + 30 mg mg daily CBD oil   We discussed: Pt is no longer taking NSAIDs since AKI; she reports pain is not as well controlled but overall tolerable   Plan: Continue current medications    Depression/Anxiety    Patient has failed these meds in past: n/a Patient is currently controlled on the following medications:  Duloxetine 60 + 30 mg daily Mirtazapine 45 mg HS Alprazolam - sparingly   We discussed:  Pt reports doing well with current doses   Plan: Continue current medications  Health Maintenance    Patient is currently controlled on the following medications:  Melatonin  Vitamin B complex Multivitamin Cetirizine 10 mg daily  Probiotic  [employer] supplements   We discussed: Pt takes several supplements to help with chronic pain, including pain rollers with essential oils. She reports supplements have helped her greatly and she has been able to take fewer muscle relaxers   Plan: Continue current medications  Patient Goals/Self-Care Activities Patient will:  - take medications as prescribed focus on medication adherence by pill packs check blood pressure daily, document, and provide at future appointments -make appt with cardiology to discuss HR/BP issues -schedule annual PCP visit - plan for lipid panel, A1c and other labs      Patient verbalizes understanding of instructions provided today and agrees to view in MyChart.  Telephone follow up appointment with  pharmacy team member scheduled for: 5 months  Kayla Floyd, PharmD, [place], CPP Clinical Pharmacist [facility] [phone]

## 2021-01-05 ENCOUNTER — Ambulatory Visit: Payer: Medicare Other | Admitting: Allergy

## 2021-01-08 ENCOUNTER — Encounter: Payer: Self-pay | Admitting: Internal Medicine

## 2021-01-09 ENCOUNTER — Other Ambulatory Visit: Payer: Self-pay | Admitting: Internal Medicine

## 2021-01-10 ENCOUNTER — Telehealth: Payer: Self-pay

## 2021-01-10 NOTE — Progress Notes (Signed)
Chronic Care Management Pharmacy Assistant   Name: NIL BOLSER  MRN: [id number] DOB: 02-20-1981   Reason for Encounter: Disease State   Conditions to be addressed/monitored: General   Recent office visits:  None ID  Recent consult visits:  None ID  Hospital visits:  None since the last coordination call  Medications: Outpatient Encounter Medications as of 01/10/2021  Medication Sig   acetaminophen (TYLENOL) 325 MG tablet Take 2 tablets (650 mg total) by mouth every 6 (six) hours as needed (pain).   atenolol (TENORMIN) 25 MG tablet TAKE 1 TABLET BY MOUTH EVERY DAY (Patient taking differently: Take 25 mg by mouth daily. If BP >130/80)   atorvastatin (LIPITOR) 10 MG tablet TAKE 1 TABLET BY MOUTH EVERY DAY   b complex vitamins tablet Take 1 tablet by mouth daily.   baclofen (LIORESAL) 10 MG tablet TAKE 1 TABLET BY MOUTH THREE TIMES A DAY AS NEEDED FOR MUSCLE SPASM   botulinum toxin Type A (BOTOX) 100 units SOLR injection Inject 100 Units into the muscle every 3 (three) months. Every 12 weeks at [facility]   cetirizine (ZYRTEC) 10 MG tablet Take 10 mg by mouth daily. Reported on 07/13/2015   DULoxetine (CYMBALTA) 30 MG capsule TAKE 1 CAPSULE BY MOUTH DAILY. TAKE DAILY IN ADDITION TO 60 MG DAILY FOR 90 MG DAILY   DULoxetine (CYMBALTA) 60 MG capsule TAKE 1 CAPSULE BY MOUTH EVERY DAY. WILL NEED A FOLLOW UP APPOINTMENT FOR ADDITIONAL REFILLS   EPINEPHrine 0.3 mg/0.3 mL IJ SOAJ injection Inject 0.3 mg into the muscle as needed for anaphylaxis.   fenofibrate (TRICOR) 145 MG tablet TAKE 1 TABLET (145MG  TOTAL) BY MOUTH EVERY DAY. PLEASE SCHEDULE FOLLOW UP FOR FUTURE REFILLS   ferrous sulfate 325 (65 FE) MG tablet Take 1 tablet (325 mg total) by mouth daily with breakfast.   hyoscyamine (LEVSIN SL) 0.125 MG SL tablet PLACE 1 TABLET (0.125 MG TOTAL) UNDER THE TONGUE EVERY 6 (SIX) HOURS AS NEEDED.   MELATONIN PO Take 10 mg by mouth daily.   metFORMIN (GLUCOPHAGE) 1000 MG tablet  TAKE 1 TABLET BY MOUTH TWICE A DAY WITH MEALS   mirtazapine (REMERON) 45 MG tablet TAKE 1 TABLET BY MOUTH EVERYDAY AT BEDTIME   Multiple Vitamin (MULTIVITAMIN) tablet Take 1 tablet by mouth daily.     nitrofurantoin, macrocrystal-monohydrate, (MACROBID) 100 MG capsule TAKE 1 CAPSULE BY MOUTH ONCE FOR 1 DOSE. WITH INTERCOURSE   norethindrone-ethinyl estradiol (JUNEL 1/20) 1-20 MG-MCG tablet Take 1 tablet by mouth daily.   omeprazole (PRILOSEC) 40 MG capsule TAKE 1 CAPSULE BY MOUTH EVERY DAY   pregabalin (LYRICA) 150 MG capsule TAKE 1 CAPSULE BY MOUTH TWICE A DAY   Probiotic Product (PROBIOTIC-10 PO) Take 1 capsule by mouth daily.   tiZANidine (ZANAFLEX) 4 MG tablet TAKE 1 TABLET BY MOUTH EVERY 8 HOURS AS NEEDED FOR MUSCLE SPASM   valACYclovir (VALTREX) 1000 MG tablet TAKE 1 TABLET DAILY. INCREASE TO TWICE A DAY WITH OUTBREAKS   No facility-administered encounter medications on file as of 01/10/2021.   Have you had any problems recently with your health? Patient states that she does not have any new health issues  Have you had any problems with your pharmacy? Patient states that she does not have any problems with getting medications from the pharmacy or the cost  of meds  What issues or side effects are you having with your medications?Patient states that she does not have any side effects from medications  What would  you like me to pass along to [facility] for them to help you with? Patient states she is doing fine  What can we do to take care of you better? Patient states she is fine for now  Care Gaps: Colonoscopy-NA Diabetic Foot Exam-NA Mammogram-NA Ophthalmology-NA Dexa Scan - NA Annual Well Visit - NA Micro albumin-NA Hemoglobin A1c- NA  Star Rating Drugs: Atorvastatin 10 mg last fill 12/28/20 30 ds Metformin 1000 mg last fill 12/28/20 60 ds  Velvet Bathe Clinical Pharmacist Assistant [phone]

## 2021-02-08 ENCOUNTER — Other Ambulatory Visit: Payer: Self-pay | Admitting: Internal Medicine

## 2021-02-08 DIAGNOSIS — M549 Dorsalgia, unspecified: Secondary | ICD-10-CM

## 2021-03-02 ENCOUNTER — Ambulatory Visit: Payer: Medicare Other | Admitting: Allergy

## 2021-03-02 ENCOUNTER — Other Ambulatory Visit: Payer: Self-pay

## 2021-03-02 ENCOUNTER — Encounter: Payer: Self-pay | Admitting: Allergy

## 2021-03-02 VITALS — BP 120/92 | HR 129 | Temp 98.2°F | Resp 20 | Ht 65.0 in | Wt 189.2 lb

## 2021-03-02 DIAGNOSIS — J4520 Mild intermittent asthma, uncomplicated: Secondary | ICD-10-CM

## 2021-03-02 DIAGNOSIS — T7800XA Anaphylactic reaction due to unspecified food, initial encounter: Secondary | ICD-10-CM

## 2021-03-02 DIAGNOSIS — H1013 Acute atopic conjunctivitis, bilateral: Secondary | ICD-10-CM | POA: Diagnosis not present

## 2021-03-02 DIAGNOSIS — J3089 Other allergic rhinitis: Secondary | ICD-10-CM

## 2021-03-02 NOTE — Patient Instructions (Addendum)
-  will obtain alpha gal IgE panel as well as tryptase level -continue avoidance of red meats, dairy and gelatin products as well as glycerol -have access to self-injectable epinephrine Epipen 0.3mg  at all times -follow emergency action plan in case of allergic reaction  -environmental allergy panel is positive to grass pollen, tree pollen, molds, cat and horse.   - Copy of test results provided.  - Avoidance measures provided - Continue with: Zyrtec (cetirizine) 10mg  tablet once daily - Start taking: Ryaltris (olopatadine/mometasone) two sprays per nostril 1-2 times daily as needed for nasal congestion and drainage Pataday (olopatadine) one drop per eye twice daily as needed for itchy/watery eyes - You can use an extra dose of the antihistamine, if needed, for breakthrough symptoms.  - Consider nasal saline rinses 1-2 times daily to remove allergens from the nasal cavities as well as help with mucous clearance (this is especially helpful to do before the nasal sprays are given)  -have access to albuterol inhaler 2 puffs every 4-6 hours as needed for cough/wheeze/shortness of breath/chest tightness.  May use 15-20 minutes prior to activity.   Monitor frequency of use.    Follow-up in 6 months or sooner if needed

## 2021-03-02 NOTE — Addendum Note (Signed)
Addended by: Rolland Bimler D on: 03/02/2021 04:30 PM   Modules accepted: Orders

## 2021-03-02 NOTE — Progress Notes (Signed)
New Patient Note  RE: Kayla Floyd MRN: [id number] DOB: 07/14/1980 Date of Office Visit: 03/02/2021  Primary care provider: Pincus Sanes, MD  Chief Complaint: Allergies  History of present illness: Kayla Floyd is a 40 y.o. female presenting today for evaluation of allergies to food and other products.  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  She states she was living in [place] around 2011 and states there was a large population of the lone star tick there.  She states she had [employer] burger and become "violently ill".  She reports nausea, vomiting diarrhea with the red meat ingestion that started several hours after ingestion.  She took benadryl and states this helps resolved symptoms.  She states symptoms have progressed with future red meat ingestion where she has developed tongue swelling, throat swelling/tight, face tingling developed more quickly than her first reaction.  She avoids red meat.  She now states she has to avoid whey and dairy as well as gelatin products as causes similar symptoms as the red meat above.  She states she has always had lactose intolerance.   She does have an epinephrine device.   She eats gluten in moderation but states can develop bloating/gas and discomfort.    She also states she has gastroparesis.    She states she had tongue swelling and throat tightness chewing Orbit gum that contains glycerol.  She states she had similar issues with other foods and the similar link was glycerol in ingredients list.   She has nasal congestion, drainage, ear pressure, sneezing, itchy/watery eyes.  She takes cetirizine daily that does help.  Sometimes she will need to double her cetirizine. She doesn't typically use any nasal sprays.  She also doesn't use any eye drops at this time.  She has a neti pot at home however she states she has a mental block in performing it.    No history of eczema.    She states she gets bronchitis and URI often and has had pnuemonia.  She states younger she would get asthma bronchitis and did have albuterol inhaler.    She states she had allergy testing when she was around 40 years of age.  Review of systems: Review of Systems  Constitutional: Negative.   HENT: Negative.    Eyes: Negative.   Respiratory: Negative.    Cardiovascular: Negative.   Gastrointestinal: Negative.   Musculoskeletal: Negative.   Skin: Negative.   Allergic/Immunologic: Negative.   Neurological: Negative.    All other systems negative unless noted above in HPI  Past medical history: Past Medical History:  Diagnosis Date   ANXIETY    BACK PAIN, CHRONIC    DYSAUTONOMIA 2011   "POTS" , recurrent syncope - follows with cards for same   GERD    HSV (herpes simplex virus) anogenital infection 06/2015   cervix swab   HYPERLIPIDEMIA    Kidney failure    MIGRAINE HEADACHE    Palpitations    POLYCYSTIC OVARIAN DISEASE    POTS (postural orthostatic tachycardia syndrome)    Recurrent upper respiratory infection (URI)    Tear meniscus knee 06/14/2010   right    Past surgical history: Past Surgical History:  Procedure Laterality Date   Cervical spine  2010   ACDF c5-6   crevical fusion   2010   evs     LUMBAR LAMINECTOMY  2008   TONSILLECTOMY AND ADENOIDECTOMY  Family history:  Family History  Problem Relation Age of Onset   Bipolar disorder Mother    Stroke Mother        Age of 39 while on OCP   Other Father        MVA   Heart disease Father    Heart attack Father    Colon cancer Maternal Grandmother 21   Cancer Maternal Grandmother    Hypertension Sister    Hypertension Sister    Migraines Sister    Breast cancer Paternal Grandmother        dx in her 51s-40s   Cancer Paternal Uncle        ocular   Breast cancer Paternal Aunt 32   Lung cancer Paternal Uncle     Social history: Lives in a home without carpeting with gas heating and central  cooling.  Dog and cats in the home.  No concern for water damage, mildew or roaches in the home.  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  She smokes cigarettes 4/day for past 5 years.    Medication List: Current Outpatient Medications  Medication Sig Dispense Refill   acetaminophen (TYLENOL) 325 MG tablet Take 2 tablets (650 mg total) by mouth every 6 (six) hours as needed (pain).     atenolol (TENORMIN) 25 MG tablet TAKE 1 TABLET BY MOUTH EVERY DAY (Patient taking differently: Take 25 mg by mouth daily. If BP >130/80) 30 tablet 2   atorvastatin (LIPITOR) 10 MG tablet TAKE 1 TABLET BY MOUTH EVERY DAY 30 tablet 2   b complex vitamins tablet Take 1 tablet by mouth daily.     baclofen (LIORESAL) 10 MG tablet TAKE 1 TABLET BY MOUTH THREE TIMES A DAY AS NEEDED FOR MUSCLE SPASM 90 tablet 2   botulinum toxin Type A (BOTOX) 100 units SOLR injection Inject 100 Units into the muscle every 3 (three) months. Every 12 weeks at [facility] 2 vial    cetirizine (ZYRTEC) 10 MG tablet Take 10 mg by mouth daily. Reported on 07/13/2015     DULoxetine (CYMBALTA) 30 MG capsule TAKE 1 CAPSULE BY MOUTH DAILY. TAKE DAILY IN ADDITION TO 60 MG DAILY FOR 90 MG DAILY 30 capsule 5   DULoxetine (CYMBALTA) 60 MG capsule TAKE 1 CAPSULE BY MOUTH EVERY DAY. WILL NEED A FOLLOW UP APPOINTMENT FOR ADDITIONAL REFILLS 30 capsule 5   EPINEPHrine 0.3 mg/0.3 mL IJ SOAJ injection Inject 0.3 mg into the muscle as needed for anaphylaxis. 1 each 5   fenofibrate (TRICOR) 145 MG tablet TAKE 1 TABLET (145MG  TOTAL) BY MOUTH EVERY DAY. PLEASE SCHEDULE FOLLOW UP FOR FUTURE REFILLS 30 tablet 2   hyoscyamine (LEVSIN SL) 0.125 MG SL tablet PLACE 1 TABLET (0.125 MG TOTAL) UNDER THE TONGUE EVERY 6 (SIX) HOURS AS NEEDED. 120 tablet 1   MELATONIN PO Take 10 mg by mouth daily.     metFORMIN (GLUCOPHAGE) 1000 MG tablet TAKE 1 TABLET BY MOUTH TWICE A DAY WITH MEALS 60 tablet 2   mirtazapine (REMERON) 45 MG tablet TAKE 1 TABLET BY MOUTH EVERYDAY AT  BEDTIME 30 tablet 2   Multiple Vitamin (MULTIVITAMIN) tablet Take 1 tablet by mouth daily.       nitrofurantoin, macrocrystal-monohydrate, (MACROBID) 100 MG capsule TAKE 1 CAPSULE BY MOUTH ONCE FOR 1 DOSE. WITH INTERCOURSE 30 capsule 3   norethindrone-ethinyl estradiol (JUNEL 1/20) 1-20 MG-MCG tablet Take 1 tablet by mouth daily. 63 tablet 4   omeprazole (PRILOSEC) 40 MG capsule TAKE  1 CAPSULE BY MOUTH EVERY DAY 30 capsule 2   pregabalin (LYRICA) 150 MG capsule TAKE 1 CAPSULE BY MOUTH TWICE A DAY 60 capsule 2   Probiotic Product (PROBIOTIC-10 PO) Take 1 capsule by mouth daily.     tiZANidine (ZANAFLEX) 4 MG tablet TAKE 1 TABLET BY MOUTH EVERY 8 HOURS AS NEEDED FOR MUSCLE SPASMS 90 tablet 1   valACYclovir (VALTREX) 1000 MG tablet TAKE 1 TABLET DAILY. INCREASE TO TWICE A DAY WITH OUTBREAKS 60 tablet 2   ferrous sulfate 325 (65 FE) MG tablet Take 1 tablet (325 mg total) by mouth daily with breakfast. 30 tablet 0   No current facility-administered medications for this visit.    Known medication allergies: Allergies  Allergen Reactions   Alpha-Gal Anaphylaxis   Beef-Derived Products     anaphylaxes    Glycerol, Iodinated Anaphylaxis    anaphylaxes    Milk-Related Compounds Anaphylaxis    Cheese, whey protein, gelatin   Pork-Derived Products     anaplaxis     Physical examination: Blood pressure (!) 120/92, pulse (!) 129, temperature 98.2 F (36.8 C), temperature source Temporal, resp. rate 20, height 5\' 5"  (1.651 m), weight 189 lb 3.2 oz (85.8 kg), SpO2 99 %.  General: Alert, interactive, in no acute distress. HEENT: PERRLA, TMs pearly gray, turbinates minimally edematous without discharge, post-pharynx non erythematous. Neck: Supple without lymphadenopathy. Lungs: Clear to auscultation without wheezing, rhonchi or rales. {no increased work of breathing. CV: Normal S1, S2 without murmurs. Abdomen: Nondistended, nontender. Skin: Warm and dry, without lesions or  rashes. Extremities:  No clubbing, cyanosis or edema. Neuro:   Grossly intact.  Diagnositics/Labs:  Spirometry: FEV1: 2.09L 68%, FVC: 2.43L 64%, ratio consistent with mild restrictive pattern  Allergy testing:   Environmental allergy skin prick testing is positive to horse, cat, botrytis, alternaria, elm, beech, perennial rye   Airborne Adult Perc - 03/02/21 1524     Time Antigen Placed --             Food Perc - 03/02/21 1525       Test Information   Time Antigen Placed 1455    Allergen Manufacturer 14/09/22    Location Back    Number of allergen test 1    Food Select      Food   1. Peanut Omitted    2. Soybean food Omitted    3. Wheat, whole Omitted    4. Sesame Omitted    5. Milk, cow Negative    6. Egg White, chicken Omitted    7. Casein Omitted    8. Shellfish mix Omitted    9. Fish mix Omitted    10. Cashew Omitted             Allergy testing results were read and interpreted by provider, documented by clinical staff.   Assessment and plan: Food Allergy  -will obtain alpha gal IgE panel as well as tryptase level -continue avoidance of red meats, dairy and gelatin products as well as glycerol -have access to self-injectable epinephrine Epipen 0.3mg  at all times -follow emergency action plan in case of allergic reaction  Allergic rhinitis with conjunctivitis -environmental allergy panel is positive to grass pollen, tree pollen, molds, cat and horse.   - Copy of test results provided.  - Avoidance measures provided - Continue with: Zyrtec (cetirizine) 10mg  tablet once daily - Start taking: Ryaltris (olopatadine/mometasone) two sprays per nostril 1-2 times daily as needed for nasal congestion and drainage Pataday (olopatadine) one drop  per eye twice daily as needed for itchy/watery eyes - You can use an extra dose of the antihistamine, if needed, for breakthrough symptoms.  - Consider nasal saline rinses 1-2 times daily to remove allergens from the  nasal cavities as well as help with mucous clearance (this is especially helpful to do before the nasal sprays are given)  Reactive airway with respiratory illness -have access to albuterol inhaler 2 puffs every 4-6 hours as needed for cough/wheeze/shortness of breath/chest tightness.  May use 15-20 minutes prior to activity.   Monitor frequency of use.    Follow-up in 6 months or sooner if needed   I appreciate the opportunity to take part in Axel's care. Please do not hesitate to contact me with questions.  Sincerely,   Margo Aye, MD Allergy/Immunology [facility]

## 2021-03-05 LAB — ALPHA-GAL PANEL
Allergen Lamb IgE: <0.1 kU/L
Beef IgE: <0.1 kU/L
IgE (Immunoglobulin E), Serum: 30 IU/mL (ref 6–495)
O215-IgE Alpha-Gal: <0.1 kU/L
Pork IgE: <0.1 kU/L

## 2021-03-05 LAB — TRYPTASE: Tryptase: 6.3 ug/L (ref 2.2–13.2)

## 2021-03-07 ENCOUNTER — Encounter: Payer: Self-pay | Admitting: Allergy

## 2021-03-07 NOTE — Telephone Encounter (Signed)
Pertaining to her lab results

## 2021-03-14 ENCOUNTER — Other Ambulatory Visit: Payer: Self-pay | Admitting: Internal Medicine

## 2021-04-09 ENCOUNTER — Other Ambulatory Visit: Payer: Self-pay | Admitting: Internal Medicine

## 2021-04-19 DIAGNOSIS — G43719 Chronic migraine without aura, intractable, without status migrainosus: Secondary | ICD-10-CM | POA: Diagnosis not present

## 2021-04-20 ENCOUNTER — Ambulatory Visit: Payer: Medicare Other | Admitting: Family Medicine

## 2021-04-20 ENCOUNTER — Other Ambulatory Visit: Payer: Self-pay

## 2021-04-20 ENCOUNTER — Encounter: Payer: Self-pay | Admitting: Family Medicine

## 2021-04-20 VITALS — BP 130/80 | HR 95 | Temp 97.5°F | Resp 20

## 2021-04-20 DIAGNOSIS — T7800XD Anaphylactic reaction due to unspecified food, subsequent encounter: Secondary | ICD-10-CM | POA: Diagnosis not present

## 2021-04-20 DIAGNOSIS — T7800XA Anaphylactic reaction due to unspecified food, initial encounter: Secondary | ICD-10-CM

## 2021-04-20 NOTE — Progress Notes (Signed)
[name] [address] Dept: [phone]  FOLLOW UP NOTE  Patient ID: Kayla Floyd, female    DOB: [birth year]  Age: 41 y.o. MRN: [id number] Date of Office Visit: 04/20/2021  Assessment  Chief Complaint: Food/Drug Challenge (beef)  HPI Kayla Floyd is a 41 year old female who presents to the clinic for follow-up visit with food challenge to beef.  She was last seen in this clinic on 03/02/2021 by Dr. 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  At today's visit, she reports that she is feeling well overall with no gastrointestinal, integumentary, or cardiopulmonary symptoms at today's visit.  She reports that while she lived in [place] around 2011.  Believes that she was bitten by a [place] tick and developed alpha gal allergy.  She reports that she had a hamburger from [employer] and experienced vomiting and diarrhea that occurred the next day and lasted for 4 to 5 days.  She reports that she began to avoid mammalian meat at that time.  She reports several accidental ingestions over the last several years involving vomiting and diarrhea. Lab test from 03/02/2021 indicates negative alpha gal allergy and negative beef allergy.  She continues to report incidences of vomiting and diarrhea after ingestion of pork products.  On I might be hungry she has not taken any antihistamines over the last 3 days.  Her current medications are listed in the chart.    Drug Allergies:  Allergies  Allergen Reactions   Alpha-Gal Anaphylaxis   Beef-Derived Products     anaphylaxes    Glycerol, Iodinated Anaphylaxis    anaphylaxes    Milk-Related Compounds Anaphylaxis    Cheese, whey protein, gelatin   Pork-Derived Products     anaplaxis    Physical Exam: BP 130/80    Pulse 95    Temp (!) 97.5 F (36.4 C) (Temporal)    Resp 20    SpO2 100%     Physical Exam Vitals reviewed.  Constitutional:      Appearance: Normal appearance.  HENT:     Head: Normocephalic and atraumatic.     Right Ear: Tympanic membrane normal.     Left Ear: Tympanic membrane normal.     Nose:     Comments: Bilateral nares normal.  Pharynx normal.  Ears normal.  Eyes normal.    Mouth/Throat:     Pharynx: Oropharynx is clear.  Eyes:     Conjunctiva/sclera: Conjunctivae normal.  Cardiovascular:     Rate and Rhythm: Normal rate and regular rhythm.     Heart sounds: Normal heart sounds. No murmur heard. Pulmonary:     Effort: Pulmonary effort is normal.     Breath sounds: Normal breath sounds.     Comments: Lungs clear to auscultation Musculoskeletal:        General: Normal range of motion.     Cervical back: Normal range of motion and neck supple.  Skin:    General: Skin is warm and dry.  Neurological:     Mental Status: She is alert and oriented to person, place, and time.  Psychiatric:        Mood and Affect: Mood normal.        Behavior: Behavior normal.        Thought Content: Thought content normal.        Judgment: Judgment normal.    Procedure note: Written consent obtained  Open graded beef oral challenge: The patient was able to tolerate the challenge today without adverse signs or symptoms. Vital signs were stable throughout the challenge and observation period. She received multiple doses separated by 20 minutes, each of which was separated by vitals and a brief physical exam. She received the following doses: lip rub, 1/16th of a patty, 1/8 patty, 1/4 patty, 1/2 + remainder of beef patty. Total challenge amount 1 full beef patty.Marland Kitchen She was monitored for 60 minutes following the last dose.  Total testing time: 166 minutes  The patient had negative skin prick test and sIgE tests to beef and alpha gal  and was able to tolerate the open graded oral challenge today without adverse signs or symptoms. Therefore, she has the same risk of  systemic reaction associated with the consumption of beef  as the general population.   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 Therefore, Marthella has the same risk of systemic reaction associated with the consumption of beef as the general population.  - Do not give any beef  for the next 24 hours. - Monitor for allergic symptoms such as rash, wheezing, diarrhea, swelling, and vomiting for the next 24 hours. If severe symptoms occur, treat with EpiPen injection and call 911. For less severe symptoms treat with Benadryl 50 mg every 4 hours and call the clinic.  - If no allergic symptoms are evident, reintroduce beef  into the diet. If you develop an allergic reaction to beef , record what was eaten the amount eaten, preparation method, time from ingestion to reaction, and symptoms.   Food allergy Continue to avoid pork and products containing glycerol. In case of an allergic reaction, give Benadryl 50 mg every 4 hours, and if life-threatening symptoms occur, inject with EpiPen 0.3 mg.  Call the clinic if this treatment plan is not working well for you  Follow up in 1 year or sooner if needed.     Return in about 1 year (around 04/20/2022), or if symptoms worsen or fail to improve.    Thank you for the opportunity to care for this patient.  Please do not hesitate to contact me with questions.  Gareth Morgan, FNP [facility]

## 2021-04-20 NOTE — Patient Instructions (Addendum)
Oral office food challenge to beef Kayla Floyd was able to tolerate the beef food challenge today at the office without adverse signs or symptoms of an allergic reaction. Therefore, Kayla Floyd has the same risk of systemic reaction associated with the consumption of beef as the general population.  - Do not give any beef  for the next 24 hours. - Monitor for allergic symptoms such as rash, wheezing, diarrhea, swelling, and vomiting for the next 24 hours. If severe symptoms occur, treat with EpiPen injection and call 911. For less severe symptoms treat with Benadryl 50 mg every 4 hours and call the clinic.  - If no allergic symptoms are evident, reintroduce beef  into the diet. If you develop an allergic reaction to beef , record what was eaten the amount eaten, preparation method, time from ingestion to reaction, and symptoms.   Food allergy Continue to avoid pork and products containing glycerol. In case of an allergic reaction, give Benadryl 50 mg every 4 hours, and if life-threatening symptoms occur, inject with EpiPen 0.3 mg.  Call the clinic if this treatment plan is not working well for you  Follow up in 1 year or sooner if needed.

## 2021-04-23 NOTE — Progress Notes (Signed)
Subjective:    Patient ID: Kayla Floyd, female    DOB: 1980-12-23, 41 y.o.   MRN: [id number]  This visit occurred during the SARS-CoV-2 public health emergency.  Safety protocols were in place, including screening questions prior to the visit, additional usage of staff PPE, and extensive cleaning of exam room while observing appropriate contact time as indicated for disinfecting solutions.     HPI The patient is here for follow up of their chronic medical problems, including POTs, htn, hld, prediabetes, anxiety, depression, chronic lower back pain  Having a lot of insomnia.  She does not fall asleep until 4-5 am.  Drinks 1 cup of coffee in the morning only.  She is taking remeron but it does not help.  She takes melatonin.    Nerve pain in left leg - chronic - but worse.  Stretching helps.  Lyrica  helps.    She lost weight while she was sick.  Since then she has been thinking that she just needs to eat like that all the way all the time.  She has h/o an eating d/o and has had this thought more and more.    Has coffee for breakfast, light lunch, dinner is reasonable, snack at night is fruit.    Medications and allergies reviewed with patient and updated if appropriate.  Patient Active Problem List   Diagnosis Date Noted   Multiple food allergies 10/25/2020   Nausea vomiting and diarrhea 10/17/2020   Iron deficiency anemia 10/17/2020   SIRS (systemic inflammatory response syndrome) ([place]) 10/17/2020   Myalgia 10/20/2018   Dysphagia 06/27/2018   Acute cystitis without hematuria 09/23/2017   Gastroparesis 10/28/2016   Prediabetes 08/31/2016   Genetic testing 08/29/2016   Depression 08/28/2016   Family history of colon cancer    Family history of breast cancer    Obese 03/15/2015   Chronic migraine without aura without status migrainosus, not intractable 06/20/2014   Tear meniscus knee 06/14/2010   Dysautonomia - POTS 03/20/2010   Anxiety 07/30/2009   Essential  hypertension 07/28/2009   SYNCOPE AND COLLAPSE 07/10/2009   PALPITATIONS 07/10/2009   POLYCYSTIC OVARIAN DISEASE 08/31/2008   Hypercholesterolemia with hypertriglyceridemia 08/31/2008   GERD 08/31/2008   Chronic back pain 08/31/2008    Current Outpatient Medications on File Prior to Visit  Medication Sig Dispense Refill   acetaminophen (TYLENOL) 325 MG tablet Take 2 tablets (650 mg total) by mouth every 6 (six) hours as needed (pain).     atenolol (TENORMIN) 25 MG tablet TAKE 1 TABLET BY MOUTH EVERY DAY 30 tablet 2   atorvastatin (LIPITOR) 10 MG tablet TAKE 1 TABLET BY MOUTH EVERY DAY 30 tablet 2   b complex vitamins tablet Take 1 tablet by mouth daily.     baclofen (LIORESAL) 10 MG tablet TAKE 1 TABLET BY MOUTH THREE TIMES A DAY AS NEEDED FOR MUSCLE SPASM 90 tablet 2   BOTOX 200 units SOLR      botulinum toxin Type A (BOTOX) 100 units SOLR injection Inject 100 Units into the muscle every 3 (three) months. Every 12 weeks at [place] [facility] 2 vial    cetirizine (ZYRTEC) 10 MG tablet Take 10 mg by mouth daily. Reported on 07/13/2015     DULoxetine (CYMBALTA) 30 MG capsule TAKE 1 CAPSULE BY MOUTH DAILY. TAKE DAILY IN ADDITION TO 60 MG DAILY FOR 90 MG DAILY 30 capsule 5   DULoxetine (CYMBALTA) 60 MG capsule TAKE 1 CAPSULE BY MOUTH EVERY DAY.  WILL NEED A FOLLOW UP APPOINTMENT FOR ADDITIONAL REFILLS 30 capsule 5   EPINEPHrine 0.3 mg/0.3 mL IJ SOAJ injection Inject 0.3 mg into the muscle as needed for anaphylaxis. 1 each 5   fenofibrate (TRICOR) 145 MG tablet TAKE 1 TABLET (145MG  TOTAL) BY MOUTH EVERY DAY. PLEASE SCHEDULE FOLLOW UP FOR FUTURE REFILLS 30 tablet 2   hyoscyamine (LEVSIN SL) 0.125 MG SL tablet PLACE 1 TABLET UNDER THE TONGUE EVERY 6 (SIX) HOURS AS NEEDED. 120 tablet 1   MELATONIN PO Take 10 mg by mouth daily.     metFORMIN (GLUCOPHAGE) 1000 MG tablet TAKE 1 TABLET BY MOUTH TWICE A DAY WITH MEALS 60 tablet 2   Multiple Vitamin (MULTIVITAMIN) tablet Take 1 tablet by mouth daily.        nitrofurantoin, macrocrystal-monohydrate, (MACROBID) 100 MG capsule TAKE 1 CAPSULE BY MOUTH ONCE FOR 1 DOSE. WITH INTERCOURSE 30 capsule 3   norethindrone-ethinyl estradiol (JUNEL 1/20) 1-20 MG-MCG tablet Take 1 tablet by mouth daily. 63 tablet 4   omeprazole (PRILOSEC) 40 MG capsule TAKE 1 CAPSULE BY MOUTH EVERY DAY 30 capsule 2   pregabalin (LYRICA) 150 MG capsule TAKE 1 CAPSULE BY MOUTH TWICE A DAY 60 capsule 2   Probiotic Product (PROBIOTIC-10 PO) Take 1 capsule by mouth daily.     promethazine (PHENERGAN) 12.5 MG tablet Take by mouth.     SUMAtriptan (IMITREX) 25 MG tablet Take 25 mg by mouth every 2 (two) hours as needed for migraine. May repeat in 2 hours if headache persists or recurs.     tiZANidine (ZANAFLEX) 4 MG tablet TAKE 1 TABLET BY MOUTH EVERY 8 HOURS AS NEEDED FOR MUSCLE SPASM 90 tablet 1   valACYclovir (VALTREX) 1000 MG tablet TAKE 1 TABLET DAILY. INCREASE TO TWICE A DAY WITH OUTBREAKS 60 tablet 2   ferrous sulfate 325 (65 FE) MG tablet Take 1 tablet (325 mg total) by mouth daily with breakfast. 30 tablet 0   No current facility-administered medications on file prior to visit.    Past Medical History:  Diagnosis Date   ANXIETY    BACK PAIN, CHRONIC    DYSAUTONOMIA 2011   "POTS" , recurrent syncope - follows with cards for same   GERD    HSV (herpes simplex virus) anogenital infection 06/2015   cervix swab   HYPERLIPIDEMIA    Kidney failure    MIGRAINE HEADACHE    Palpitations    POLYCYSTIC OVARIAN DISEASE    POTS (postural orthostatic tachycardia syndrome)    Recurrent upper respiratory infection (URI)    Tear meniscus knee 06/14/2010   right    Past Surgical History:  Procedure Laterality Date   Cervical spine  2010   ACDF c5-6   crevical fusion   2010   evs     LUMBAR LAMINECTOMY  2008   TONSILLECTOMY AND ADENOIDECTOMY      Social History   Socioeconomic History   Marital status: Married    Spouse name: Not on file   Number of children: Not on file    Years of education: Not on file   Highest education level: Not on file  Occupational History   Occupation: part-time [employer]  Tobacco Use   Smoking status: Every Day    Packs/day: 1.00    Years: 10.00    Pack years: 10.00    Types: Cigarettes   Smokeless tobacco: Never   Tobacco comments:    3-4 cigarettes per day  Vaping Use   Vaping Use: Never used  Substance and Sexual Activity   Alcohol use: Yes    Alcohol/week: 0.0 standard drinks    Comment: occasionally   Drug use: No   Sexual activity: Yes    Partners: Male    Birth control/protection: Pill    Comment: 1st intercourse 41 yo-More than 5 partners, married- 1 yr  Other Topics Concern   Not on file  Social History Narrative   Disability since 10/2012 due to POTS, 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[employer]      Some exercise on a regular exericse   Social Determinants of Health   Financial Resource Strain: Low Risk    Difficulty of Paying Living Expenses: Not hard at all  Food Insecurity: No Food Insecurity   Worried About Charity fundraiser in the Last Year: Never true   [place] in the Last Year: Never true  Transportation Needs: No Transportation Needs   Lack of Transportation (Medical): No   Lack of Transportation (Non-Medical): No  Physical Activity: Inactive   Days of Exercise per Week: 0 days   Minutes of Exercise per Session: 0 min  Stress: No Stress Concern Present   Feeling of Stress : Not at all  Social Connections: Not on file    Family History  Problem Relation Age of Onset   Bipolar disorder Mother    Stroke Mother        Age of 78 while on OCP   Other Father        MVA   Heart disease Father    Heart attack Father    Colon cancer Maternal Grandmother 73   Cancer Maternal Grandmother    Hypertension Sister    Hypertension Sister    Migraines Sister    Breast cancer Paternal Grandmother        dx in her 44s-40s   Cancer Paternal Uncle        ocular   Breast cancer Paternal Aunt 50    Lung cancer Paternal Uncle     Review of Systems  Constitutional:  Negative for fever.  Respiratory:  Negative for cough, shortness of breath and wheezing.   Cardiovascular:  Positive for chest pain (less) and palpitations (occ). Negative for leg swelling.  Neurological:  Positive for light-headedness (chronic - better) and headaches.       Electric like pain in left leg      Objective:   Vitals:   04/24/21 1503  BP: 112/72  Pulse: 92  Temp: 98.3 F (36.8 C)  SpO2: 97%   BP Readings from Last 3 Encounters:  04/24/21 112/72  04/20/21 130/80  03/02/21 (!) 120/92   Wt Readings from Last 3 Encounters:  04/24/21 188 lb (85.3 kg)  03/02/21 189 lb 3.2 oz (85.8 kg)  10/25/20 195 lb (88.5 kg)   Body mass index is 31.28 kg/m.   Physical Exam    Constitutional: Appears well-developed and well-nourished. No distress.  HENT:  Head: Normocephalic and atraumatic.  Neck: Neck supple. No tracheal deviation present. No thyromegaly present.  No cervical lymphadenopathy Cardiovascular: Normal rate, regular rhythm and normal heart sounds.   No murmur heard. No carotid bruit .  No edema Pulmonary/Chest: Effort normal and breath sounds normal. No respiratory distress. No has no wheezes. No rales.  Skin: Skin is warm and dry. Not diaphoretic.  Psychiatric: Normal mood and affect. Behavior is normal.      Assessment & Plan:    See Problem List for Assessment and Plan of chronic  medical problems.

## 2021-04-23 NOTE — Patient Instructions (Addendum)
° ° °  Blood work was ordered.     Medications changes include :  taper off remeron.   Lunesta for sleep  Your prescription(s) have been submitted to your pharmacy. Please take as directed and contact our office if you believe you are having problem(s) with the medication(s).   Please followup in 6 months

## 2021-04-24 ENCOUNTER — Other Ambulatory Visit (HOSPITAL_COMMUNITY): Payer: Self-pay

## 2021-04-24 ENCOUNTER — Ambulatory Visit (INDEPENDENT_AMBULATORY_CARE_PROVIDER_SITE_OTHER): Payer: Medicare Other | Admitting: Internal Medicine

## 2021-04-24 ENCOUNTER — Other Ambulatory Visit: Payer: Self-pay

## 2021-04-24 ENCOUNTER — Encounter: Payer: Self-pay | Admitting: Internal Medicine

## 2021-04-24 VITALS — BP 112/72 | HR 92 | Temp 98.3°F | Ht 65.0 in | Wt 188.0 lb

## 2021-04-24 DIAGNOSIS — G8929 Other chronic pain: Secondary | ICD-10-CM | POA: Diagnosis not present

## 2021-04-24 DIAGNOSIS — F419 Anxiety disorder, unspecified: Secondary | ICD-10-CM

## 2021-04-24 DIAGNOSIS — G4709 Other insomnia: Secondary | ICD-10-CM

## 2021-04-24 DIAGNOSIS — D509 Iron deficiency anemia, unspecified: Secondary | ICD-10-CM | POA: Diagnosis not present

## 2021-04-24 DIAGNOSIS — F3289 Other specified depressive episodes: Secondary | ICD-10-CM | POA: Diagnosis not present

## 2021-04-24 DIAGNOSIS — E782 Mixed hyperlipidemia: Secondary | ICD-10-CM

## 2021-04-24 DIAGNOSIS — R7303 Prediabetes: Secondary | ICD-10-CM

## 2021-04-24 DIAGNOSIS — G43709 Chronic migraine without aura, not intractable, without status migrainosus: Secondary | ICD-10-CM | POA: Diagnosis not present

## 2021-04-24 DIAGNOSIS — M549 Dorsalgia, unspecified: Secondary | ICD-10-CM

## 2021-04-24 DIAGNOSIS — G901 Familial dysautonomia [Riley-Day]: Secondary | ICD-10-CM

## 2021-04-24 DIAGNOSIS — I10 Essential (primary) hypertension: Secondary | ICD-10-CM | POA: Diagnosis not present

## 2021-04-24 DIAGNOSIS — G4700 Insomnia, unspecified: Secondary | ICD-10-CM | POA: Insufficient documentation

## 2021-04-24 LAB — LIPID PANEL
Cholesterol: 164 mg/dL (ref 0–200)
HDL: 35.9 mg/dL — ABNORMAL LOW (ref >39.00)
NonHDL: 127.81
Total CHOL/HDL Ratio: 5
Triglycerides: 238 mg/dL — ABNORMAL HIGH (ref 0.0–149.0)
VLDL: 47.6 mg/dL — ABNORMAL HIGH (ref 0.0–40.0)

## 2021-04-24 LAB — COMPREHENSIVE METABOLIC PANEL
ALT: 15 U/L (ref 0–35)
AST: 16 U/L (ref 0–37)
Albumin: 4.6 g/dL (ref 3.5–5.2)
Alkaline Phosphatase: 30 U/L — ABNORMAL LOW (ref 39–117)
BUN: 13 mg/dL (ref 6–23)
CO2: 28 mEq/L (ref 19–32)
Calcium: 10 mg/dL (ref 8.4–10.5)
Chloride: 104 mEq/L (ref 96–112)
Creatinine, Ser: 0.85 mg/dL (ref 0.40–1.20)
GFR: 85.17 mL/min (ref >60.00)
Glucose, Bld: 107 mg/dL — ABNORMAL HIGH (ref 70–99)
Potassium: 3.9 mEq/L (ref 3.5–5.1)
Sodium: 138 mEq/L (ref 135–145)
Total Bilirubin: 0.2 mg/dL (ref 0.2–1.2)
Total Protein: 7.3 g/dL (ref 6.0–8.3)

## 2021-04-24 LAB — CBC WITH DIFFERENTIAL/PLATELET
Basophils Absolute: 0.1 10*3/uL (ref 0.0–0.1)
Basophils Relative: 0.9 % (ref 0.0–3.0)
Eosinophils Absolute: 0.2 10*3/uL (ref 0.0–0.7)
Eosinophils Relative: 2.1 % (ref 0.0–5.0)
HCT: 32.8 % — ABNORMAL LOW (ref 36.0–46.0)
Hemoglobin: 10.2 g/dL — ABNORMAL LOW (ref 12.0–15.0)
Lymphocytes Relative: 38.1 % (ref 12.0–46.0)
Lymphs Abs: 2.7 10*3/uL (ref 0.7–4.0)
MCHC: 31.1 g/dL (ref 30.0–36.0)
MCV: 75.7 fl — ABNORMAL LOW (ref 78.0–100.0)
Monocytes Absolute: 0.4 10*3/uL (ref 0.1–1.0)
Monocytes Relative: 6.2 % (ref 3.0–12.0)
Neutro Abs: 3.7 10*3/uL (ref 1.4–7.7)
Neutrophils Relative %: 52.7 % (ref 43.0–77.0)
Platelets: 264 10*3/uL (ref 150.0–400.0)
RBC: 4.33 Mil/uL (ref 3.87–5.11)
RDW: 15.7 % — ABNORMAL HIGH (ref 11.5–15.5)
WBC: 7.1 10*3/uL (ref 4.0–10.5)

## 2021-04-24 LAB — IBC PANEL
Iron: 25 ug/dL — ABNORMAL LOW (ref 42–145)
Saturation Ratios: 4.1 % — ABNORMAL LOW (ref 20.0–50.0)
TIBC: 609 ug/dL — ABNORMAL HIGH (ref 250.0–450.0)
Transferrin: 435 mg/dL — ABNORMAL HIGH (ref 212.0–360.0)

## 2021-04-24 LAB — LDL CHOLESTEROL, DIRECT: Direct LDL: 108 mg/dL

## 2021-04-24 LAB — HEMOGLOBIN A1C: Hgb A1c MFr Bld: 6 % (ref 4.6–6.5)

## 2021-04-24 MED ORDER — ESZOPICLONE 2 MG PO TABS
2.0000 mg | ORAL_TABLET | Freq: Every evening | ORAL | 2 refills | Status: DC | PRN
  Filled 2021-04-24: qty 30, 30d supply, fill #0

## 2021-04-24 MED ORDER — MIRTAZAPINE 15 MG PO TBDP
ORAL_TABLET | ORAL | 0 refills | Status: DC
  Filled 2021-04-24: qty 35, 21d supply, fill #0

## 2021-04-24 NOTE — Assessment & Plan Note (Signed)
Chronic Taking iron daily Ck cbc, iron panel

## 2021-04-24 NOTE — Assessment & Plan Note (Signed)
Chronic Check a1c Low sugar / carb diet Stressed regular exercise  

## 2021-04-24 NOTE — Assessment & Plan Note (Signed)
Chronic Check lipid panel  Continue atorvastatin 10 mg daily Regular exercise and healthy diet encouraged  

## 2021-04-24 NOTE — Assessment & Plan Note (Signed)
Chronic Continue lyrica 150 mg bid, tizanidine 4 mg tid prn or baclofen 10 mg tid, cymbalta 90 mg daily

## 2021-04-24 NOTE — Assessment & Plan Note (Signed)
Chronic Following with cardiology  

## 2021-04-24 NOTE — Assessment & Plan Note (Signed)
Chronic Management per the headache center On botox Taking imitrex prn

## 2021-04-24 NOTE — Assessment & Plan Note (Signed)
Chronic remeron not working - taper off decrease to 30 mg nightly x 2 weeks, then 15 mg nightly for 1 week then stop Start lunesta 2 mg nightly - hope this to be temporary

## 2021-04-24 NOTE — Assessment & Plan Note (Signed)
Chronic BP well controlled Continue atenolol 25 mg daily cmp

## 2021-04-24 NOTE — Assessment & Plan Note (Signed)
Chronic °Controlled, stable °Continue cymbalta 90 mg daily °Taper off remeron which she was taking for sleep - no longer working ° °

## 2021-04-24 NOTE — Assessment & Plan Note (Signed)
Chronic Controlled, stable Continue cymbalta 90 mg daily Taper off remeron which she was taking for sleep - no longer working

## 2021-05-09 ENCOUNTER — Other Ambulatory Visit: Payer: Self-pay

## 2021-05-09 ENCOUNTER — Encounter: Payer: Self-pay | Admitting: Nurse Practitioner

## 2021-05-09 ENCOUNTER — Other Ambulatory Visit (HOSPITAL_COMMUNITY)
Admission: RE | Admit: 2021-05-09 | Discharge: 2021-05-09 | Disposition: A | Discharge status: Home / Self Care | Payer: Medicare Other | Source: Ambulatory Visit | Attending: Nurse Practitioner | Admitting: Nurse Practitioner

## 2021-05-09 ENCOUNTER — Other Ambulatory Visit: Payer: Self-pay | Admitting: Internal Medicine

## 2021-05-09 ENCOUNTER — Ambulatory Visit (INDEPENDENT_AMBULATORY_CARE_PROVIDER_SITE_OTHER): Payer: Medicare Other | Admitting: Nurse Practitioner

## 2021-05-09 VITALS — BP 118/74 | Ht 65.0 in | Wt 189.0 lb

## 2021-05-09 DIAGNOSIS — Z01419 Encounter for gynecological examination (general) (routine) without abnormal findings: Secondary | ICD-10-CM | POA: Insufficient documentation

## 2021-05-09 DIAGNOSIS — A609 Anogenital herpesviral infection, unspecified: Secondary | ICD-10-CM | POA: Diagnosis not present

## 2021-05-09 DIAGNOSIS — Z9289 Personal history of other medical treatment: Secondary | ICD-10-CM

## 2021-05-09 DIAGNOSIS — Z1151 Encounter for screening for human papillomavirus (HPV): Secondary | ICD-10-CM | POA: Diagnosis not present

## 2021-05-09 DIAGNOSIS — G8929 Other chronic pain: Secondary | ICD-10-CM

## 2021-05-09 DIAGNOSIS — N390 Urinary tract infection, site not specified: Secondary | ICD-10-CM | POA: Diagnosis not present

## 2021-05-09 MED ORDER — NITROFURANTOIN MONOHYD MACRO 100 MG PO CAPS: ORAL_CAPSULE | ORAL | 1 refills | Status: DC

## 2021-05-09 MED ORDER — VALACYCLOVIR HCL 1 G PO TABS: 1000.0000 mg | ORAL_TABLET | Freq: Every day | ORAL | 3 refills | Status: DC

## 2021-05-09 MED ORDER — NORETHINDRONE ACET-ETHINYL EST 1-20 MG-MCG PO TABS: 1.0000 | ORAL_TABLET | Freq: Every day | ORAL | 3 refills | Status: DC

## 2021-05-09 NOTE — Progress Notes (Signed)
° °  Kayla Floyd [birth year] [id number]   History:  41 y.o. G0 presents for breast and pelvic exam. Monthly cycles. Stopped OCPs during IVF but wants to restart continuously to suppress menses. Has surrogate who is 18 weeks, having a girl. Normal pap history. History of recurrent postcoital UTIs, takes macrodantin prophylactically with intercourse.  HLD, HTN, migraines, POTS managed by PCP. HSV, occasional outbreaks, takes Valtrex daily.   Gynecologic History Patient's last menstrual period was 05/05/2021. Period Cycle (Days): 28 Period Duration (Days): 6 Period Pattern: Regular Menstrual Flow: Heavy Dysmenorrhea: (!) Moderate Dysmenorrhea Symptoms: Cramping Contraception/Family planning: OCP (estrogen/progesterone) Sexually active: Yes  Health Maintenance Last Pap: 05/05/2015. Results were: Normal Last mammogram: Never Last colonoscopy: Never Last Dexa: Not indicated  Past medical history, past surgical history, family history and social history were all reviewed and documented in the EPIC chart. 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   ROS:  A ROS was performed and pertinent positives and negatives are included.  Exam:  Vitals:   05/09/21 1540  BP: 118/74  Weight: 189 lb (85.7 kg)  Height: 5\' 5"  (1.651 m)   Body mass index is 31.45 kg/m.  General appearance:  Normal Thyroid:  Symmetrical, normal in size, without palpable masses or nodularity. Respiratory  Auscultation:  Clear without wheezing or rhonchi Cardiovascular  Auscultation:  Regular rate, without rubs, murmurs or gallops  Edema/varicosities:  Not grossly evident Abdominal  Soft,nontender, without masses, guarding or rebound.  Liver/spleen:  No organomegaly noted  Hernia:  None appreciated  Skin  Inspection:  Grossly normal Breasts: Examined lying and sitting.   Right: Without masses, retractions, nipple discharge or axillary adenopathy.   Left: Without masses, retractions, nipple discharge  or axillary adenopathy. Genitourinary   Inguinal/mons:  Normal without inguinal adenopathy  External genitalia:  Normal appearing vulva with no masses, tenderness, or lesions  BUS/Urethra/Skene's glands:  Normal  Vagina:  Normal appearing with normal color and discharge, no lesions  Cervix:  Normal appearing without discharge or lesions  Uterus:  Normal in size, shape and contour.  Midline and mobile, nontender  Adnexa/parametria:     Rt: Normal in size, without masses or tenderness.   Lt: Normal in size, without masses or tenderness.  Anus and perineum: Normal  Patient informed chaperone available to be present for breast and pelvic exam. Patient has requested no chaperone to be present. Patient has been advised what will be completed during breast and pelvic exam.   Assessment/Plan:  41 y.o. G0 for breast and pelvic exam.   Well female exam with routine gynecological exam - Plan: Cytology - PAP( [facility]). Education provided on SBEs, importance of preventative screenings, current guidelines, high calcium diet, regular exercise, and multivitamin daily. Labs with PCP.   Encounter for gynecological examination without abnormal finding - Plan: norethindrone-ethinyl estradiol (JUNEL 1/20) 1-20 MG-MCG tablet daily. Wants to take continuously for menstrual suppression. Refill x 1 year provided.   HSV (herpes simplex virus) anogenital infection - Plan: valACYclovir (VALTREX) 1000 MG tablet daily. Occasional outbreaks.  Recurrent UTI - Plan: nitrofurantoin, macrocrystal-monohydrate, (MACROBID) 100 MG capsule after intercourse.   Screening for cervical cancer - Normal Pap history. Pap with HR HPV today.   Screening for breast cancer - Has not had screening mammogram. Recommend starting screenings now. Normal breast exam today.  Return in 1 year for annual.   Tamela Gammon DNP, 4:00 PM 05/09/2021

## 2021-05-11 LAB — CYTOLOGY - PAP
Comment: NEGATIVE
High risk HPV: NEGATIVE

## 2021-05-30 ENCOUNTER — Ambulatory Visit (INDEPENDENT_AMBULATORY_CARE_PROVIDER_SITE_OTHER): Payer: Medicare Other

## 2021-05-30 DIAGNOSIS — I10 Essential (primary) hypertension: Secondary | ICD-10-CM

## 2021-05-30 DIAGNOSIS — F419 Anxiety disorder, unspecified: Secondary | ICD-10-CM

## 2021-05-30 DIAGNOSIS — R7303 Prediabetes: Secondary | ICD-10-CM

## 2021-05-30 DIAGNOSIS — F3289 Other specified depressive episodes: Secondary | ICD-10-CM

## 2021-05-30 DIAGNOSIS — E782 Mixed hyperlipidemia: Secondary | ICD-10-CM

## 2021-05-30 NOTE — Progress Notes (Signed)
Chronic Care Management Pharmacy Note  05/30/2021 Name:  Kayla Floyd MRN:  [id number] DOB:  1980-06-30  Summary: -Patient reports that she had trialed lunesta x 30 days but did not notice a major improvement after starting - continues with mirtazapine 87m hs dose  - stopped lunesta  -Has been taking atenolol 12.5-25mg daily based on BP in the AM - reports BP will drop low if she takes a full tablet and BP is <130 - has been stable since starting to monitor per patient  -LDL at goal with last check - TG improved drastically from where it has been in the past  Recommendations/Changes made from today's visit: -Recommended for patient to continue current medications at this time, continue monitoring BP daily -reach out with any issues or concerns  -recommended for patient to practice good sleep hygiene to help promote a more restful sleep - also recommended to move all duloxetine dosing to AM as this may help improve insomnia  -Will discuss options for insomnia with PCP / possible referral to sleep study   Subjective: Kayla STANKUSis an 41y.o. year old female who is a primary patient of Burns, SClaudina Lick MD.  The CCM team was consulted for assistance with disease management and care coordination needs.    Engaged with patient by telephone for follow up visit in response to provider referral for pharmacy case management and/or care coordination services.   Consent to Services:  The patient was given information about Chronic Care Management services, agreed to services, and gave verbal consent prior to initiation of services.  Please see initial visit note for detailed documentation.   Patient Care Team: BBinnie Rail MD as PCP - General (Internal Medicine) Kayla Breeding MD (Cardiology) Kayla Bond MD (Orthopedic Surgery) Kayla Floyd(Nurse Practitioner) [facility] (Pharmacist)  Recent office visits: 04/24/2021 - Dr. BQuay Burow-  taper remeron - start lJohnnye Sima  Recent consult visits: 05/09/2021 - TMarny LowensteinNP - Gynecology - jLenda Kelp1/20 continuously for menstrual suppression - f/u in 1 year 04/20/2021 - AChristiana- no changes to medications - f/u in 1 year  03/02/2021 - Dr. PNelva Bush- All[facility]- no changes to medications - f/u in 6 months   Hospital visits: None in previous 6 months    Objective:  Lab Results  Component Value Date   CREATININE 0.85 04/24/2021   BUN 13 04/24/2021   GFR 85.17 04/24/2021   GFRNONAA 31 (L) 10/19/2020   GFRAA >90 04/23/2011   NA 138 04/24/2021   K 3.9 04/24/2021   CALCIUM 10.0 04/24/2021   CO2 28 04/24/2021   GLUCOSE 107 (H) 04/24/2021    Lab Results  Component Value Date/Time   HGBA1C 6.0 04/24/2021 03:52 PM   HGBA1C 5.7 05/24/2019 04:21 PM   GFR 85.17 04/24/2021 03:52 PM   GFR 72.05 10/25/2020 04:23 PM    Last diabetic Eye exam: No results found for: HMDIABEYEEXA  Last diabetic Foot exam: No results found for: HMDIABFOOTEX   Lab Results  Component Value Date   CHOL 164 04/24/2021   HDL 35.90 (L) 04/24/2021   LDLCALC 92 04/07/2013   LDLDIRECT 108.0 04/24/2021   TRIG 238.0 (H) 04/24/2021   CHOLHDL 5 04/24/2021    Hepatic Function Latest Ref Rng & Units 04/24/2021 10/17/2020 05/24/2019  Total Protein 6.0 - 8.3 g/dL 7.3 6.7 7.8  Albumin 3.5 - 5.2 g/dL 4.6 4.1 4.5  AST 0 - 37 U/L _0 ALT 0 - 35 U/L 15 20 34  Alk Phosphatase 39 - 117 U/L 30(L) 28(L) 39  Total Bilirubin 0.2 - 1.2 mg/dL 0.2 0.3 0.4  Bilirubin, Direct 0.0 - 0.3 mg/dL - - -    Lab Results  Component Value Date/Time   TSH 1.47 08/08/2017 12:39 PM   TSH 1.17 08/28/2016 04:00 PM    CBC Latest Ref Rng & Units 04/24/2021 10/18/2020 10/17/2020  WBC 4.0 - 10.5 K/uL 7.1 9.2 8.8  Hemoglobin 12.0 - 15.0 g/dL 10.2(L) 9.5(L) 9.1(L)  Hematocrit 36.0 - 46.0 % 32.8(L) 30.8(L) 28.4(L)  Platelets 150.0 - 400.0 K/uL 264.0 273 271    No results found for:  VD25OH  Clinical ASCVD: No  The 10-year ASCVD risk score (Arnett DK, et al., 2019) is: 4.2%   Values used to calculate the score:     Age: 41 years     Sex: Female     Is Non-Hispanic African American: No     Diabetic: No     Tobacco smoker: Yes     Systolic Blood Pressure: 122 mmHg     Is BP treated: Yes     HDL Cholesterol: 35.9 mg/dL     Total Cholesterol: 164 mg/dL    Depression screen [facility] 2/9 06/21/2020 01/12/2019 06/20/2014  Decreased Interest 0 0 0  Down, Depressed, Hopeless 0 1 0  PHQ - 2 Score 0 1 0  Tired, decreased energy - 2 -  Change in appetite - 0 -  Feeling bad or failure about yourself  - 0 -  Trouble concentrating - 0 -  Moving slowly or fidgety/restless - 0 -  Suicidal thoughts - 0 -  Difficult doing work/chores - Not difficult at all -  Some recent data might be hidden     Social History   Tobacco Use  Smoking Status Every Day   Packs/day: 1.00   Years: 10.00   Pack years: 10.00   Types: Cigarettes  Smokeless Tobacco Never  Tobacco Comments   3-4 cigarettes per day   BP Readings from Last 3 Encounters:  05/09/21 118/74  04/24/21 112/72  04/20/21 130/80   Pulse Readings from Last 3 Encounters:  04/24/21 92  04/20/21 95  03/02/21 (!) 129   Wt Readings from Last 3 Encounters:  05/09/21 189 lb (85.7 kg)  04/24/21 188 lb (85.3 kg)  03/02/21 189 lb 3.2 oz (85.8 kg)   BMI Readings from Last 3 Encounters:  05/09/21 31.45 kg/m  04/24/21 31.28 kg/m  03/02/21 31.48 kg/m    Assessment/Interventions: Review of patient past medical history, allergies, medications, health status, including review of consultants reports, laboratory and other test data, was performed as part of comprehensive evaluation and provision of chronic care management services.   SDOH:  (Social Determinants of Health) assessments and interventions performed: Yes  SDOH Screenings   Alcohol Screen: Low Risk    Last Alcohol Screening Score (AUDIT): 1  Depression (PHQ2-9):  Low Risk    PHQ-2 Score: 0  Financial Resource Strain: Low Risk    Difficulty of Paying Living Expenses: Not hard at all  Food Insecurity: No Food Insecurity   Worried About Charity fundraiser in the Last Year: Never true   Ran Out of Food in the Last Year: Never true  Housing: Low Risk    Last Housing Risk Score: 0  Physical Activity: Inactive   Days of Exercise per Week: 0 days   Minutes  of Exercise per Session: 0 min  Social Connections: Not on file  Stress: No Stress Concern Present   Feeling of Stress : Not at all  Tobacco Use: High Risk   Smoking Tobacco Use: Every Day   Smokeless Tobacco Use: Never   Passive Exposure: Not on file  Transportation Needs: No Transportation Needs   Lack of Transportation (Medical): No   Lack of Transportation (Non-Medical): No    CCM Care Plan  Allergies  Allergen Reactions   Alpha-Gal Anaphylaxis   Beef-Derived Products     anaphylaxes    Glycerol, Iodinated Anaphylaxis    anaphylaxes    Milk-Related Compounds Anaphylaxis    Cheese, whey protein, gelatin   Pork-Derived Products     anaplaxis    Medications Reviewed Today     Reviewed by Tomasa Blase, [facility] (Pharmacist) on 05/30/21 at [place] List Status: <None>   Medication Order Taking? Sig Documenting Provider Last Dose Status Informant  acetaminophen (TYLENOL) 325 MG tablet [id number] Yes Take 2 tablets (650 mg total) by mouth every 6 (six) hours as needed (pain). Arrien, Jimmy Picket, MD Taking Active   atenolol (TENORMIN) 25 MG tablet [id number] Yes TAKE 1 TABLET BY MOUTH EVERY DAY  Patient taking differently: Take 12.5-25 mg by mouth daily. TAKE 1 TABLET BY MOUTH EVERY DAY.   Kayla Rail, MD Taking Active   atorvastatin (LIPITOR) 10 MG tablet [id number] Yes TAKE 1 TABLET BY MOUTH EVERY DAY Burns, Claudina Lick, MD Taking Active   b complex vitamins tablet [id number] Yes Take 1 tablet by mouth daily. [provider] Taking Active Self  baclofen (LIORESAL) 10 MG  tablet [id number] Yes TAKE 1 TABLET BY MOUTH THREE TIMES A DAY AS NEEDED FOR MUSCLE SPASM Quay Burow, Claudina Lick, MD Taking Active   BOTOX 200 units SOLR [id number] Yes  [provider] Taking Active   botulinum toxin Type A (BOTOX) 100 units SOLR injection [id number] Yes Inject 100 Units into the muscle every 3 (three) months. Every 12 weeks at [facility] Rowe Clack, MD Taking Active Self  cetirizine (ZYRTEC) 10 MG tablet [id number] Yes Take 10 mg by mouth daily. Reported on 07/13/2015 [provider] Taking Active Self  DULoxetine (CYMBALTA) 30 MG capsule [id number] Yes TAKE 1 CAPSULE BY MOUTH DAILY. TAKE DAILY IN ADDITION TO 60 MG DAILY FOR 90 MG DAILY  Patient taking differently: Take 30 mg by mouth every evening.   Kayla Rail, MD Taking Active   DULoxetine (CYMBALTA) 60 MG capsule [id number] Yes TAKE 1 CAPSULE BY MOUTH EVERY DAY. WILL NEED A FOLLOW UP APPOINTMENT FOR ADDITIONAL REFILLS Kayla Rail, MD Taking Active   EPINEPHrine 0.3 mg/0.3 mL IJ SOAJ injection [id number]  Inject 0.3 mg into the muscle as needed for anaphylaxis. Kayla Rail, MD  Active   eszopiclone Johnnye Sima) 2 MG TABS tablet [id number] No Take 1 tablet by mouth at bedtime as needed for sleep. Take immediately before bedtime  Patient not taking: Reported on 05/30/2021   Kayla Rail, MD Not Taking Active   fenofibrate (TRICOR) 145 MG tablet [id number] Yes TAKE 1 TABLET (145MG TOTAL) BY MOUTH EVERY DAY. PLEASE SCHEDULE FOLLOW UP FOR FUTURE REFILLS Kayla Rail, MD Taking Active   ferrous sulfate 325 (65 FE) MG tablet [id number]  Take 1 tablet (325 mg total) by mouth daily with breakfast. Tawni Millers, MD  Expired 11/19/20 2359   hyoscyamine (LEVSIN SL) 0.125 MG SL tablet [id number] Yes PLACE 1 TABLET  UNDER THE TONGUE EVERY 6 (SIX) HOURS AS NEEDED. Kayla Rail, MD Taking Active   MELATONIN PO [id number] Yes Take 10 mg by mouth daily. [provider] Taking Active Self  metFORMIN  (GLUCOPHAGE) 1000 MG tablet [id number] Yes TAKE 1 TABLET BY MOUTH TWICE A DAY WITH MEALS Burns, Claudina Lick, MD Taking Active   mirtazapine (REMERON SOL-TAB) 15 MG disintegrating tablet [id number] Yes Dissolve 2 tablets by mouth nightly for 2 weeks and 1 tablet nightly for 1 week then stop  Patient taking differently: 45 mg. Dissolve 2 tablets by mouth nightly for 2 weeks and 1 tablet nightly for 1 week then stop   Kayla Rail, MD Taking Active   Multiple Vitamin (MULTIVITAMIN) tablet [id number] Yes Take 1 tablet by mouth daily.   [provider] Taking Active Self  nitrofurantoin, macrocrystal-monohydrate, (MACROBID) 100 MG capsule [id number] Yes TAKE 1 CAPSULE BY MOUTH ONCE FOR 1 DOSE. WITH INTERCOURSE Tamela Gammon, NP Taking Active   norethindrone-ethinyl estradiol (JUNEL 1/20) 1-20 MG-MCG tablet [id number] Yes Take 1 tablet by mouth daily. Take ACTIVE pills only, skipping PLACEBOS Marny Lowenstein A, NP Taking Active   omeprazole (PRILOSEC) 40 MG capsule [id number] Yes TAKE 1 CAPSULE BY MOUTH EVERY DAY Burns, Claudina Lick, MD Taking Active   pregabalin (LYRICA) 150 MG capsule [id number] Yes TAKE 1 CAPSULE BY MOUTH TWICE A DAY Burns, Claudina Lick, MD Taking Active   Probiotic Product (PROBIOTIC-10 PO) [id number] Yes Take 1 capsule by mouth daily. [provider] Taking Active Self  promethazine (PHENERGAN) 12.5 MG tablet [id number] Yes Take by mouth daily as needed. [provider] Taking Active   SUMAtriptan (IMITREX) 25 MG tablet [id number] Yes Take 25 mg by mouth every 2 (two) hours as needed for migraine. May repeat in 2 hours if headache persists or recurs. [provider] Taking Active   tiZANidine (ZANAFLEX) 4 MG tablet [id number] Yes TAKE 1 TABLET BY MOUTH EVERY 8 HOURS AS NEEDED FOR MUSCLE SPASM Burns, Claudina Lick, MD Taking Active   valACYclovir (VALTREX) 1000 MG tablet [id number] Yes Take 1 tablet (1,000 mg total) by mouth daily. Tamela Gammon, NP Taking Active              Patient Active Problem List   Diagnosis Date Noted   Insomnia 04/24/2021   Multiple food allergies 10/25/2020   Nausea vomiting and diarrhea 10/17/2020   Iron deficiency anemia 10/17/2020   SIRS (systemic inflammatory response syndrome) ([place]) 10/17/2020   Myalgia 10/20/2018   Dysphagia 06/27/2018   Acute cystitis without hematuria 09/23/2017   Gastroparesis 10/28/2016   Prediabetes 08/31/2016   Genetic testing 08/29/2016   Depression 08/28/2016   Family history of colon cancer    Family history of breast cancer    Obese 03/15/2015   Chronic migraine without aura without status migrainosus, not intractable 06/20/2014   Tear meniscus knee 06/14/2010   Dysautonomia - POTS 03/20/2010   Anxiety 07/30/2009   Essential hypertension 07/28/2009   SYNCOPE AND COLLAPSE 07/10/2009   PALPITATIONS 07/10/2009   POLYCYSTIC OVARIAN DISEASE 08/31/2008   Hypercholesterolemia with hypertriglyceridemia 08/31/2008   GERD 08/31/2008   Chronic back pain 08/31/2008    Immunization History  Administered Date(s) Administered   DTP 03/31/1980, 05/02/1980, 06/07/1980, 07/05/1981, 11/09/1984   Influenza Whole 12/23/2008   Influenza,inj,Quad PF,6+ Mos 12/23/2017   MMR 04/25/1981, 08/26/2003   Moderna Sars-Covid-2 Vaccination 07/06/2019, 08/03/2019   OPV 03/31/1980, 06/07/1980, 08/09/1980, 07/05/1981, 11/09/1984   Td 11/11/1994, 03/25/2008   Tdap 12/23/2017  Conditions to be addressed/monitored:  Hypertension, Hyperlipidemia, Depression, and Anxiety, Prediabetes, PCOS, migraine, chronic back pain  Care Plan : [place]  Updates made by Tomasa Blase, RPH since 05/30/2021 12:00 AM     Problem: Hypertension, Hyperlipidemia, Depression, and Anxiety, Prediabetes, PCOS, migraine, chronic back pain   Priority: High     Long-Range Goal: Patient-Specific Goal   Start Date: 12/29/2020  Expected End Date: 12/29/2021  Recent Progress: On track  Priority: High  Note:   Current  Barriers:  Unable to independently monitor therapeutic efficacy  Pharmacist Clinical Goal(s):  Patient will achieve adherence to monitoring guidelines and medication adherence to achieve therapeutic efficacy through collaboration with PharmD and provider.   Interventions: 1:1 collaboration with Kayla Rail, MD regarding development and update of comprehensive plan of care as evidenced by provider attestation and co-signature Inter-disciplinary care team collaboration (see longitudinal plan of care) Comprehensive medication review performed; medication list updated in electronic medical record  Hypertension/Dysautonomia (POTS)    BP goal is:  <130/80  Patient checks BP at home daily, depending on SBP is how she determines how much atenolol to take  - if >130 = 54m atenolol, 120-130 = 12.522matenolol, <120 = no dose - if she takes 2575mf atenolol and SBP is <130 she will "bottom out" HR 120-130s off atenolol, 70-80 on atenolol   Patient has failed these meds in the past: n/a Patient is currently controlled on the following medications:  Atenolol 12.5-25 mg daily   We discussed:  pt has not seen cardiology recently as recommended with previous CCM visit - reports that PCP is aware of how she is taking atenolol currently; discussed antiarrythmic agent may be required - need to discuss with cardiology   Plan: Advised pt to make appt with cardiology to discuss HR/BP issues    Hyperlipidemia  Last LDL: 108 Last TG: 238 - improved from previous readings (>700) Patient has failed these meds in past: n/a Patient is currently uncontrolled on the following medications:  Atorvastatin 10 mg daily AM Fenofibrate 145 mg daily   We discussed: Lipid panel has improved since restarting atorvastatin and fenofibrate, recommending to continue current medications    Plan: Continue current medications Recommend repeat lipid panel with next PCP visit   Prediabetes  Patient has failed these meds in  past: n/a Patient is currently controlled on the following medications: Metformin 1000 mg BID (PCOS)   We discussed: diet and exercise extensively; benefits of metformin in prediabetes; risk of metabolic syndrome; repeat A1c is due   Plan: Continue current medications; repeat A1c with next PCP visit   PCOS    Patient has failed these meds in past: n/a Patient is currently controlled on the following medications:  Metformin 1000 mg BID Junel (norethindrone-ethinyl estradiol 1/20) 1 tablet daily    We discussed:  Patient is satisfied with current regimen and denies issues   Plan: Continue current medications   Migraine    Patient has failed these meds in past: topiramate Patient is currently controlled on the following medications:  Botox 100 unit q3 months  Atenolol 25 mg daily  We discussed:  Pt reports Botox has helped her significantly - migraines down to 2-3 per month from near daily.    Plan: Continue current medications   Chronic back pain    Patient has failed these meds in past: n/a Patient is currently controlled on the following medications:  Baclofen 10 mg TID prn - typically BID Pregabalin 150  mg BID Tizanidine 4 mg q8h PRN - breakthrough, typically at night Tylenol 500 mg BID Duloxetine 60 + 30 mg mg daily - currently taking 46m AM and 3577mPM CBD oil   We discussed: Pt is no longer taking NSAIDs since AKI; she reports pain is not as well controlled but overall tolerable   Plan: Continue current medications    Depression/Anxiety / Insomnia     Patient has failed these meds in past: n/a Patient is currently controlled on the following medications:  Duloxetine 60 + 30 mg daily Lunesta 77m68ms prn - no longer taking as she did not see a significant difference after taking  Mirtazapine 40m59mghtly  Melatonin 10mg65mhtly    We discussed:  Pt reports that insomnia is not well controlled, reports that she has issues falling asleep and waking up feeling  fatigued - patient reports that she does have screen time prior to bed - reviewed importance of sleep hygiene - patient noted she has trialed setting screen time limits before bed / noise machine - but was unsuccessful  -Patient asked about increased dose of lunesta  - 3mg w54md be highest does, unlikely that she would find much more benefit from the 77mg do377m-Recommended for patient to practice good sleep hygiene, recommended moving PM dose of duloxetine to AM along with 60mg do9mPossible patient could benefit from trial of ramelteon, but cautious as patient is on a number of sedating medications already -Patient has not been seen by sleep medicine in the past, would be open to referral if agreeable by PCP   Plan: Continue current medications  Health Maintenance    Patient is currently controlled on the following medications:  Melatonin  Cetirizine 10mg dai24mFerrous Sulfate 325mg dail46myoscyamine 0.125mg q6h p37mOmeprazole 40mg daily 47mbiotic daily  Pormethazine 12.5mg daily pr54mVitamin B complex Multivitamin    We discussed: Pt takes several supplements to help with chronic pain, including pain rollers with essential oils. She reports supplements have helped her greatly and she has been able to take fewer muscle relaxers   Plan: Continue current medications  Patient Goals/Self-Care Activities Patient will:  - take medications as prescribed focus on medication adherence by pill packs check blood pressure daily, document, and provide at future appointments -make appt with cardiology to discuss HR/BP issues    Medication Assistance: None required.  Patient affirms current coverage meets needs.  Care Gaps: COVID booster  Influenza Vaccine   Patient's preferred [facility] is:  [id number] [id number] [facility] [phone] box? Yes - [facility] pill packs Pt endorses 100% compliance  We discussed: Current pharmacy is preferred with insurance plan and patient is satisfied with pharmacy services Patient decided to: Continue current medication management strategy  Care Plan and Follow Up Patient Decision:  Patient agrees to Care Plan and Follow-up.  Plan: Telephone follow up appointment with care management team member scheduled for:  5 months  Shaketha Jeon C SzabTomasa Blaseical Pharmacist, [place]

## 2021-05-30 NOTE — Patient Instructions (Signed)
Visit Information ? ?Following are the goals we discussed today:  ? ?Manage My Medicine  ? ?Timeframe:  Long-Range Goal ?Priority:  High ?Start Date:     12/29/20                        ?Expected End Date:    12/29/21                ? ?Follow Up Date Sept 2023 ?  ?- call for medicine refill 2 or 3 days before it runs out ?- call if I am sick and can't take my medicine ?- keep a list of all the medicines I take; vitamins and herbals too  ?-make appt with cardiology to discuss HR/BP issues ?  ?Why is this important?   ?These steps will help you keep on track with your medicines. ? ?Plan: Telephone follow up appointment with care management team member scheduled for:  6 months ?The patient has been provided with contact information for the care management team and has been advised to call with any health related questions or concerns.  ? ?Ellin Saba, PharmD ?Clinical Pharmacist, [facility]  ? ?Please call the care guide team at [phone] if you need to cancel or reschedule your appointment.  ? ?Patient verbalizes understanding of instructions and care plan provided today and agrees to view in MyChart. Active MyChart status confirmed with patient.   ? ?

## 2021-06-08 ENCOUNTER — Other Ambulatory Visit: Payer: Self-pay | Admitting: Internal Medicine

## 2021-06-15 ENCOUNTER — Emergency Department (HOSPITAL_BASED_OUTPATIENT_CLINIC_OR_DEPARTMENT_OTHER)
Admission: EM | Admit: 2021-06-15 | Discharge: 2021-06-15 | Disposition: A | Discharge status: Home / Self Care | Payer: Medicare Other | Attending: Emergency Medicine | Admitting: Emergency Medicine

## 2021-06-15 ENCOUNTER — Telehealth: Payer: Self-pay

## 2021-06-15 ENCOUNTER — Encounter (HOSPITAL_BASED_OUTPATIENT_CLINIC_OR_DEPARTMENT_OTHER): Payer: Self-pay

## 2021-06-15 ENCOUNTER — Other Ambulatory Visit: Payer: Self-pay

## 2021-06-15 ENCOUNTER — Emergency Department (HOSPITAL_BASED_OUTPATIENT_CLINIC_OR_DEPARTMENT_OTHER): Payer: Medicare Other

## 2021-06-15 DIAGNOSIS — I700 Atherosclerosis of aorta: Secondary | ICD-10-CM | POA: Diagnosis not present

## 2021-06-15 DIAGNOSIS — K760 Fatty (change of) liver, not elsewhere classified: Secondary | ICD-10-CM | POA: Diagnosis not present

## 2021-06-15 DIAGNOSIS — K8020 Calculus of gallbladder without cholecystitis without obstruction: Secondary | ICD-10-CM | POA: Diagnosis not present

## 2021-06-15 DIAGNOSIS — R109 Unspecified abdominal pain: Secondary | ICD-10-CM | POA: Diagnosis not present

## 2021-06-15 DIAGNOSIS — Z7984 Long term (current) use of oral hypoglycemic drugs: Secondary | ICD-10-CM | POA: Insufficient documentation

## 2021-06-15 LAB — URINALYSIS, ROUTINE W REFLEX MICROSCOPIC
Bilirubin Urine: NEGATIVE
Glucose, UA: NEGATIVE mg/dL
Hgb urine dipstick: NEGATIVE
Ketones, ur: NEGATIVE mg/dL
Leukocytes,Ua: NEGATIVE
Nitrite: NEGATIVE
Protein, ur: 30 mg/dL — AB
Specific Gravity, Urine: 1.025 (ref 1.005–1.030)
pH: 5.5 (ref 5.0–8.0)

## 2021-06-15 LAB — CBC
HCT: 34.7 % — ABNORMAL LOW (ref 36.0–46.0)
Hemoglobin: 10.7 g/dL — ABNORMAL LOW (ref 12.0–15.0)
MCH: 23.6 pg — ABNORMAL LOW (ref 26.0–34.0)
MCHC: 30.8 g/dL (ref 30.0–36.0)
MCV: 76.4 fL — ABNORMAL LOW (ref 80.0–100.0)
Platelets: 332 10*3/uL (ref 150–400)
RBC: 4.54 MIL/uL (ref 3.87–5.11)
RDW: 14.9 % (ref 11.5–15.5)
WBC: 8.1 10*3/uL (ref 4.0–10.5)
nRBC: 0 % (ref 0.0–0.2)

## 2021-06-15 LAB — BASIC METABOLIC PANEL
Anion gap: 12 (ref 5–15)
BUN: 15 mg/dL (ref 6–20)
CO2: 22 mmol/L (ref 22–32)
Calcium: 10.3 mg/dL (ref 8.9–10.3)
Chloride: 105 mmol/L (ref 98–111)
Creatinine, Ser: 0.99 mg/dL (ref 0.44–1.00)
GFR, Estimated: >60 mL/min (ref >60)
Glucose, Bld: 108 mg/dL — ABNORMAL HIGH (ref 70–99)
Potassium: 4 mmol/L (ref 3.5–5.1)
Sodium: 139 mmol/L (ref 135–145)

## 2021-06-15 LAB — PREGNANCY, URINE: Preg Test, Ur: NEGATIVE

## 2021-06-15 MED ORDER — KETOROLAC TROMETHAMINE 15 MG/ML IJ SOLN
15.0000 mg | Freq: Once | INTRAMUSCULAR | Status: AC
  Administered 2021-06-15: 15 mg via INTRAVENOUS
  Filled 2021-06-15: qty 1

## 2021-06-15 MED ORDER — OXYCODONE-ACETAMINOPHEN 5-325 MG PO TABS: 1.0000 | ORAL_TABLET | Freq: Three times a day (TID) | ORAL | 0 refills | Status: DC | PRN

## 2021-06-15 MED ORDER — SODIUM CHLORIDE 0.9 % IV BOLUS
1000.0000 mL | Freq: Once | INTRAVENOUS | Status: AC
  Administered 2021-06-15: 1000 mL via INTRAVENOUS

## 2021-06-15 MED ORDER — ONDANSETRON HCL 8 MG PO TABS: 8.0000 mg | ORAL_TABLET | Freq: Two times a day (BID) | ORAL | 0 refills | Status: AC | PRN

## 2021-06-15 MED ORDER — HYDROCODONE-ACETAMINOPHEN 5-325 MG PO TABS
1.0000 | ORAL_TABLET | Freq: Once | ORAL | Status: AC
  Administered 2021-06-15: 1 via ORAL
  Filled 2021-06-15: qty 1

## 2021-06-15 MED ORDER — ONDANSETRON HCL 4 MG/2ML IJ SOLN
4.0000 mg | Freq: Once | INTRAMUSCULAR | Status: AC
  Administered 2021-06-15: 4 mg via INTRAVENOUS
  Filled 2021-06-15: qty 2

## 2021-06-15 MED ORDER — MORPHINE SULFATE (PF) 4 MG/ML IV SOLN
4.0000 mg | Freq: Once | INTRAVENOUS | Status: AC
  Administered 2021-06-15: 4 mg via INTRAVENOUS
  Filled 2021-06-15: qty 1

## 2021-06-15 NOTE — Telephone Encounter (Signed)
noted 

## 2021-06-15 NOTE — ED Triage Notes (Signed)
Patient here POV from Home with Flank Pain. ? ?Patient endorses Bilateral Flank Pain that began at 1230 today. Pain is Intermittent in nature and differs at times in Intensity.  ? ?Endorses No Urinary Symptoms. No N/V/D. ? ?States she has a recent History of AKI and was admitted in July of 2022 for Same. ? ?NAD Noted during Triage. A&Ox4. GCS 15. Ambulatory.  ?

## 2021-06-15 NOTE — ED Provider Notes (Signed)
?[facility] EMERGENCY DEPT ?Provider Note ? ? ?CSN: [id number] ?Arrival date & time: 06/15/21  1235 ? ?  ? ?History ? ?Chief Complaint  ?Patient presents with  ? Flank Pain  ? ? ?Kayla Floyd is a 41 y.o. female with history of anxiety, chronic back pain, hyperlipidemia, kidney failure, polycystic ovarian disease, dysautonomia.  Patient presents to ED due to sudden onset bilateral flank pain that began today at 12:30 PM.  Patient reports that she was driving her car when she had a sudden onset of pain causing her to stop her car and "moan in pain".  Patient states that the pain comes and goes and describes as "stabbing".  Patient reports that she was recently hospitalized last year due to acute kidney injury and she is concerned that this is occurring again.  Patient denies utilizing any OTC medications prior to presentation.  Patient endorsing flank pain. Patient denies dysuria, vaginal discharge, nausea, vomiting, fevers. ? ? ?Flank Pain ? ? ?  ? ?Home Medications ?Prior to Admission medications   ?Medication Sig Start Date End Date Taking? Authorizing Provider  ?ondansetron (ZOFRAN) 8 MG tablet Take 1 tablet (8 mg total) by mouth 2 (two) times daily as needed for nausea or vomiting. 06/15/21  Yes Al Decant, PA-C  ?oxyCODONE-acetaminophen (PERCOCET/ROXICET) 5-325 MG tablet Take 1 tablet by mouth every 8 (eight) hours as needed for severe pain. 06/15/21  Yes Al Decant, PA-C  ?acetaminophen (TYLENOL) 325 MG tablet Take 2 tablets (650 mg total) by mouth every 6 (six) hours as needed (pain). 10/19/20   Arrien, York Ram, MD  ?atenolol (TENORMIN) 25 MG tablet TAKE 1 TABLET BY MOUTH EVERY DAY 06/08/21   Pincus Sanes, MD  ?atorvastatin (LIPITOR) 10 MG tablet TAKE 1 TABLET BY MOUTH EVERY DAY 04/09/21   Pincus Sanes, MD  ?b complex vitamins tablet Take 1 tablet by mouth daily.    [provider]  ?baclofen (LIORESAL) 10 MG tablet TAKE 1 TABLET BY MOUTH THREE TIMES A DAY  AS NEEDED FOR MUSCLE SPASM 05/09/21   Pincus Sanes, MD  ?BOTOX 200 units SOLR  03/29/21   [provider]  ?botulinum toxin Type A (BOTOX) 100 units SOLR injection Inject 100 Units into the muscle every 3 (three) months. Every 12 weeks at [facility] 03/15/15   Newt Lukes, MD  ?cetirizine (ZYRTEC) 10 MG tablet Take 10 mg by mouth daily. Reported on 07/13/2015    [provider]  ?DULoxetine (CYMBALTA) 30 MG capsule TAKE 1 CAPSULE BY MOUTH DAILY. TAKE DAILY IN ADDITION TO 60 MG DAILY FOR 90 MG DAILY ?Patient taking differently: Take 30 mg by mouth every evening. 05/09/21   Pincus Sanes, MD  ?DULoxetine (CYMBALTA) 60 MG capsule TAKE 1 CAPSULE BY MOUTH EVERY DAY. WILL NEED A FOLLOW UP APPOINTMENT FOR ADDITIONAL REFILLS 04/09/21   Pincus Sanes, MD  ?EPINEPHrine 0.3 mg/0.3 mL IJ SOAJ injection Inject 0.3 mg into the muscle as needed for anaphylaxis. 10/25/20   Pincus Sanes, MD  ?eszopiclone (LUNESTA) 2 MG TABS tablet Take 1 tablet by mouth at bedtime as needed for sleep. Take immediately before bedtime ?Patient not taking: Reported on 05/30/2021 04/24/21   Pincus Sanes, MD  ?fenofibrate (TRICOR) 145 MG tablet TAKE 1 TABLET (145MG  TOTAL) BY MOUTH EVERY DAY. PLEASE SCHEDULE FOLLOW UP FOR FUTURE REFILLS 06/08/21   06/10/21, MD  ?ferrous sulfate 325 (65 FE) MG tablet Take 1 tablet (325 mg total)  by mouth daily with breakfast. 10/20/20 11/19/20  Arrien, York RamMauricio Daniel, MD  ?hyoscyamine (LEVSIN SL) 0.125 MG SL tablet PLACE 1 TABLET UNDER THE TONGUE EVERY 6 (SIX) HOURS AS NEEDED. 04/09/21   Pincus SanesBurns, Stacy J, MD  ?MELATONIN PO Take 10 mg by mouth daily.    [provider]  ?metFORMIN (GLUCOPHAGE) 1000 MG tablet TAKE 1 TABLET BY MOUTH TWICE A DAY WITH MEALS 04/09/21   Burns, Bobette MoStacy J, MD  ?mirtazapine (REMERON SOL-TAB) 15 MG disintegrating tablet Dissolve 2 tablets by mouth nightly for 2 weeks and 1 tablet nightly for 1 week then stop ?Patient taking differently: 45 mg. Dissolve 2 tablets by  mouth nightly for 2 weeks and 1 tablet nightly for 1 week then stop 04/24/21   Pincus SanesBurns, Stacy J, MD  ?Multiple Vitamin (MULTIVITAMIN) tablet Take 1 tablet by mouth daily.      [provider]  ?nitrofurantoin, macrocrystal-monohydrate, (MACROBID) 100 MG capsule TAKE 1 CAPSULE BY MOUTH ONCE FOR 1 DOSE. WITH INTERCOURSE 05/09/21   Wyline BeadyWallace, Tiffany A, NP  ?norethindrone-ethinyl estradiol (JUNEL 1/20) 1-20 MG-MCG tablet Take 1 tablet by mouth daily. Take ACTIVE pills only, skipping PLACEBOS 05/09/21   Wyline BeadyWallace, Tiffany A, NP  ?omeprazole (PRILOSEC) 40 MG capsule TAKE 1 CAPSULE BY MOUTH EVERY DAY 04/09/21   Pincus SanesBurns, Stacy J, MD  ?pregabalin (LYRICA) 150 MG capsule TAKE 1 CAPSULE BY MOUTH TWICE A DAY 06/08/21   Pincus SanesBurns, Stacy J, MD  ?Probiotic Product (PROBIOTIC-10 PO) Take 1 capsule by mouth daily.    [provider]  ?promethazine (PHENERGAN) 12.5 MG tablet Take by mouth daily as needed.    [provider]  ?SUMAtriptan (IMITREX) 25 MG tablet Take 25 mg by mouth every 2 (two) hours as needed for migraine. May repeat in 2 hours if headache persists or recurs.    [provider]  ?tiZANidine (ZANAFLEX) 4 MG tablet TAKE 1 TABLET BY MOUTH EVERY 8 HOURS AS NEEDED FOR MUSCLE SPASMS 06/08/21   Pincus SanesBurns, Stacy J, MD  ?valACYclovir (VALTREX) 1000 MG tablet Take 1 tablet (1,000 mg total) by mouth daily. 05/09/21   Olivia MackieWallace, Tiffany A, NP  ?   ? ?Allergies    ?Alpha-gal; Beef-derived products; Glycerol, iodinated; Milk-related compounds; and Pork-derived products   ? ?Review of Systems   ?Review of Systems  ?Constitutional:  Negative for chills and fever.  ?Gastrointestinal:  Negative for nausea and vomiting.  ?Genitourinary:  Positive for flank pain. Negative for dysuria and vaginal discharge.  ?All other systems reviewed and are negative. ? ?Physical Exam ?Updated Vital Signs ?BP 137/90   Pulse (!) 119   Temp 99.2 ?F (37.3 ?C) (Oral)   Resp 16   Ht 5\' 5"  (1.651 m)   Wt 85.7 kg   SpO2 97%   BMI 31.44  kg/m?  ?Physical Exam ?Vitals and nursing note reviewed.  ?Constitutional:   ?   General: She is not in acute distress. ?   Appearance: Normal appearance. She is not ill-appearing, toxic-appearing or diaphoretic.  ?HENT:  ?   Head: Normocephalic and atraumatic.  ?   Nose: Nose normal. No congestion.  ?   Mouth/Throat:  ?   Mouth: Mucous membranes are moist.  ?   Pharynx: Oropharynx is clear.  ?Eyes:  ?   Extraocular Movements: Extraocular movements intact.  ?   Conjunctiva/sclera: Conjunctivae normal.  ?   Pupils: Pupils are equal, round, and reactive to light.  ?Cardiovascular:  ?   Rate and Rhythm: Normal rate and regular rhythm.  ?  Pulmonary:  ?   Effort: Pulmonary effort is normal.  ?   Breath sounds: Normal breath sounds. No wheezing.  ?Abdominal:  ?   General: Abdomen is flat. Bowel sounds are normal.  ?   Palpations: Abdomen is soft.  ?   Tenderness: There is no abdominal tenderness. There is right CVA tenderness. There is no left CVA tenderness.  ?Musculoskeletal:  ?   Cervical back: Normal range of motion and neck supple. No tenderness.  ?Skin: ?   General: Skin is warm and dry.  ?   Capillary Refill: Capillary refill takes less than 2 seconds.  ?Neurological:  ?   Mental Status: She is alert and oriented to person, place, and time.  ? ? ?ED Results / Procedures / Treatments   ?Labs ?(all labs ordered are listed, but only abnormal results are displayed) ?Labs Reviewed  ?URINALYSIS, ROUTINE W REFLEX MICROSCOPIC - Abnormal; Notable for the following components:  ?    Result Value  ? APPearance HAZY (*)   ? Protein, ur 30 (*)   ? Bacteria, UA RARE (*)   ? All other components within normal limits  ?BASIC METABOLIC PANEL - Abnormal; Notable for the following components:  ? Glucose, Bld 108 (*)   ? All other components within normal limits  ?CBC - Abnormal; Notable for the following components:  ? Hemoglobin 10.7 (*)   ? HCT 34.7 (*)   ? MCV 76.4 (*)   ? MCH 23.6 (*)   ? All other components within normal limits   ?PREGNANCY, URINE  ? ? ?EKG ?None ? ?Radiology ?CT Renal Stone Study ? ?Result Date: 06/15/2021 ?CLINICAL DATA:  Bilateral flank pain beginning 3 hours ago. History renal failure. Dysuria. EXAM: CT ABDOMEN AND

## 2021-06-15 NOTE — Telephone Encounter (Signed)
Pt is reporting that she is heading to Urgent care. Pt thinks that her kidneys maybe shutting down. ? ?FYI ?

## 2021-06-15 NOTE — Discharge Instructions (Addendum)
Return to the ED with any new or worsening signs or symptoms like we discussed ?Please follow-up with your nephrologist.  Please call make an appointment on Monday. ?Please pick up prescription medication I have sent in for you early convenience ?Please read the attached informational guide concerning flank pain in adults ?

## 2021-06-18 ENCOUNTER — Encounter: Payer: Self-pay | Admitting: Internal Medicine

## 2021-06-22 DIAGNOSIS — E782 Mixed hyperlipidemia: Secondary | ICD-10-CM

## 2021-06-22 DIAGNOSIS — F3289 Other specified depressive episodes: Secondary | ICD-10-CM | POA: Diagnosis not present

## 2021-06-22 DIAGNOSIS — I10 Essential (primary) hypertension: Secondary | ICD-10-CM

## 2021-07-02 ENCOUNTER — Ambulatory Visit (INDEPENDENT_AMBULATORY_CARE_PROVIDER_SITE_OTHER): Payer: Medicare Other

## 2021-07-02 DIAGNOSIS — Z0000 Encounter for general adult medical examination without abnormal findings: Secondary | ICD-10-CM | POA: Diagnosis not present

## 2021-07-02 NOTE — Patient Instructions (Signed)
Kayla Floyd , ?Thank you for taking time to come for your Medicare Wellness Visit. I appreciate your ongoing commitment to your health goals. Please review the following plan we discussed and let me know if I can assist you in the future.  ? ?Screening recommendations/referrals: ?Colonoscopy: never done ?Mammogram: never done ?Bone Density: never done ?Recommended yearly ophthalmology/optometry visit for glaucoma screening and checkup ?Recommended yearly dental visit for hygiene and checkup ? ?Vaccinations: ?Influenza vaccine: declined ?Pneumococcal vaccine: declined ?Tdap vaccine: 12/23/2017; due every 10 years ?Shingles vaccine: not of age  ?Covid-19: 07/06/2019, 08/02/2009; no boosters ? ?Advanced directives: No ? ?Conditions/risks identified: Yes ? ?Next appointment: Please schedule your next Medicare Wellness Visit with your Nurse Health Advisor in 1 year by calling [phone]. ? ?Preventive Care 40-64 Years, Female ?Preventive care refers to lifestyle choices and visits with your health care provider that can promote health and wellness. ?What does preventive care include? ?A yearly physical exam. This is also called an annual well check. ?Dental exams once or twice a year. ?Routine eye exams. Ask your health care provider how often you should have your eyes checked. ?Personal lifestyle choices, including: ?Daily care of your teeth and gums. ?Regular physical activity. ?Eating a healthy diet. ?Avoiding tobacco and drug use. ?Limiting alcohol use. ?Practicing safe sex. ?Taking low-dose aspirin daily starting at age 20. ?Taking vitamin and mineral supplements as recommended by your health care provider. ?What happens during an annual well check? ?The services and screenings done by your health care provider during your annual well check will depend on your age, overall health, lifestyle risk factors, and family history of disease. ?Counseling  ?Your health care provider may ask you questions about your: ?Alcohol  use. ?Tobacco use. ?Drug use. ?Emotional well-being. ?Home and relationship well-being. ?Sexual activity. ?Eating habits. ?Work and work Statistician. ?Method of birth control. ?Menstrual cycle. ?Pregnancy history. ?Screening  ?You may have the following tests or measurements: ?Height, weight, and BMI. ?Blood pressure. ?Lipid and cholesterol levels. These may be checked every 5 years, or more frequently if you are over 60 years old. ?Skin check. ?Lung cancer screening. You may have this screening every year starting at age 82 if you have a 30-pack-year history of smoking and currently smoke or have quit within the past 15 years. ?Fecal occult blood test (FOBT) of the stool. You may have this test every year starting at age 2. ?Flexible sigmoidoscopy or colonoscopy. You may have a sigmoidoscopy every 5 years or a colonoscopy every 10 years starting at age 31. ?Hepatitis C blood test. ?Hepatitis B blood test. ?Sexually transmitted disease (STD) testing. ?Diabetes screening. This is done by checking your blood sugar (glucose) after you have not eaten for a while (fasting). You may have this done every 1-3 years. ?Mammogram. This may be done every 1-2 years. Talk to your health care provider about when you should start having regular mammograms. This may depend on whether you have a family history of breast cancer. ?BRCA-related cancer screening. This may be done if you have a family history of breast, ovarian, tubal, or peritoneal cancers. ?Pelvic exam and Pap test. This may be done every 3 years starting at age 16. Starting at age 39, this may be done every 5 years if you have a Pap test in combination with an HPV test. ?Bone density scan. This is done to screen for osteoporosis. You may have this scan if you are at high risk for osteoporosis. ?Discuss your test results, treatment options, and if  necessary, the need for more tests with your health care provider. ?Vaccines  ?Your health care provider may recommend  certain vaccines, such as: ?Influenza vaccine. This is recommended every year. ?Tetanus, diphtheria, and acellular pertussis (Tdap, Td) vaccine. You may need a Td booster every 10 years. ?Zoster vaccine. You may need this after age 67. ?Pneumococcal 13-valent conjugate (PCV13) vaccine. You may need this if you have certain conditions and were not previously vaccinated. ?Pneumococcal polysaccharide (PPSV23) vaccine. You may need one or two doses if you smoke cigarettes or if you have certain conditions. ?Talk to your health care provider about which screenings and vaccines you need and how often you need them. ?This information is not intended to replace advice given to you by your health care provider. Make sure you discuss any questions you have with your health care provider. ?Document Released: 04/07/2015 Document Revised: 11/29/2015 Document Reviewed: 01/10/2015 ?Elsevier Interactive Patient Education ? 2017 [employer] ? ? ? ?Fall Prevention in the Home ?Falls can cause injuries. They can happen to people of all ages. There are many things you can do to make your home safe and to help prevent falls. ?What can I do on the outside of my home? ?Regularly fix the edges of walkways and driveways and fix any cracks. ?Remove anything that might make you trip as you walk through a door, such as a raised step or threshold. ?Trim any bushes or trees on the path to your home. ?Use bright outdoor lighting. ?Clear any walking paths of anything that might make someone trip, such as rocks or tools. ?Regularly check to see if handrails are loose or broken. Make sure that both sides of any steps have handrails. ?Any raised decks and porches should have guardrails on the edges. ?Have any leaves, snow, or ice cleared regularly. ?Use sand or salt on walking paths during winter. ?Clean up any spills in your garage right away. This includes oil or grease spills. ?What can I do in the bathroom? ?Use night lights. ?Install grab bars  by the toilet and in the tub and shower. Do not use towel bars as grab bars. ?Use non-skid mats or decals in the tub or shower. ?If you need to sit down in the shower, use a plastic, non-slip stool. ?Keep the floor dry. Clean up any water that spills on the floor as soon as it happens. ?Remove soap buildup in the tub or shower regularly. ?Attach bath mats securely with double-sided non-slip rug tape. ?Do not have throw rugs and other things on the floor that can make you trip. ?What can I do in the bedroom? ?Use night lights. ?Make sure that you have a light by your bed that is easy to reach. ?Do not use any sheets or blankets that are too big for your bed. They should not hang down onto the floor. ?Have a firm chair that has side arms. You can use this for support while you get dressed. ?Do not have throw rugs and other things on the floor that can make you trip. ?What can I do in the kitchen? ?Clean up any spills right away. ?Avoid walking on wet floors. ?Keep items that you use a lot in easy-to-reach places. ?If you need to reach something above you, use a strong step stool that has a grab bar. ?Keep electrical cords out of the way. ?Do not use floor polish or wax that makes floors slippery. If you must use wax, use non-skid floor wax. ?Do not have throw  rugs and other things on the floor that can make you trip. ?What can I do with my stairs? ?Do not leave any items on the stairs. ?Make sure that there are handrails on both sides of the stairs and use them. Fix handrails that are broken or loose. Make sure that handrails are as long as the stairways. ?Check any carpeting to make sure that it is firmly attached to the stairs. Fix any carpet that is loose or worn. ?Avoid having throw rugs at the top or bottom of the stairs. If you do have throw rugs, attach them to the floor with carpet tape. ?Make sure that you have a light switch at the top of the stairs and the bottom of the stairs. If you do not have them, ask  someone to add them for you. ?What else can I do to help prevent falls? ?Wear shoes that: ?Do not have high heels. ?Have rubber bottoms. ?Are comfortable and fit you well. ?Are closed at the toe. Do not

## 2021-07-02 NOTE — Progress Notes (Signed)
?I connected with Kayla Floyd today by telephone and verified that I am speaking with the correct person using two identifiers. ?Location patient: home ?Location provider: work ?Persons participating in the virtual visit: patient, provider. ?  ?I discussed the limitations, risks, security and privacy concerns of performing an evaluation and management service by telephone and the availability of in person appointments. I also discussed with the patient that there may be a patient responsible charge related to this service. The patient expressed understanding and verbally consented to this telephonic visit.  ?  ?Interactive audio and video telecommunications were attempted between this provider and patient, however failed, due to patient having technical difficulties OR patient did not have access to video capability.  We continued and completed visit with audio only. ? ?Some vital signs may be absent or patient reported.  ? ?Time Spent with patient on telephone encounter: 30 minutes ? ?Subjective:  ? Kayla Floyd is a 41 y.o. female who presents for Medicare Annual (Subsequent) preventive examination. ? ?Review of Systems    ? ?Cardiac Risk Factors include: dyslipidemia;family history of premature cardiovascular disease;hypertension ? ?   ?Objective:  ?  ?There were no vitals filed for this visit. ?There is no height or weight on file to calculate BMI. ? ? ?  07/02/2021  ?  3:35 PM 06/15/2021  ?  1:06 PM 10/18/2020  ?  9:40 PM 10/17/2020  ?  2:46 PM 06/21/2020  ?  3:21 PM 01/12/2019  ? 10:48 AM  ?Advanced Directives  ?Does Patient Have a Medical Advance Directive? No No No No No No  ?Would patient like information on creating a medical advance directive? No - Patient declined No - Patient declined No - Patient declined  No - Patient declined No - Patient declined  ? ? ?Current Medications (verified) ?Outpatient Encounter Medications as of 07/02/2021  ?Medication Sig  ? acetaminophen (TYLENOL) 325 MG tablet Take 2  tablets (650 mg total) by mouth every 6 (six) hours as needed (pain).  ? atenolol (TENORMIN) 25 MG tablet TAKE 1 TABLET BY MOUTH EVERY DAY  ? atorvastatin (LIPITOR) 10 MG tablet TAKE 1 TABLET BY MOUTH EVERY DAY  ? b complex vitamins tablet Take 1 tablet by mouth daily.  ? baclofen (LIORESAL) 10 MG tablet TAKE 1 TABLET BY MOUTH THREE TIMES A DAY AS NEEDED FOR MUSCLE SPASM  ? BOTOX 200 units SOLR   ? botulinum toxin Type A (BOTOX) 100 units SOLR injection Inject 100 Units into the muscle every 3 (three) months. Every 12 weeks at [facility]  ? cetirizine (ZYRTEC) 10 MG tablet Take 10 mg by mouth daily. Reported on 07/13/2015  ? DULoxetine (CYMBALTA) 30 MG capsule TAKE 1 CAPSULE BY MOUTH DAILY. TAKE DAILY IN ADDITION TO 60 MG DAILY FOR 90 MG DAILY (Patient taking differently: Take 30 mg by mouth every evening.)  ? DULoxetine (CYMBALTA) 60 MG capsule TAKE 1 CAPSULE BY MOUTH EVERY DAY. WILL NEED A FOLLOW UP APPOINTMENT FOR ADDITIONAL REFILLS  ? EPINEPHrine 0.3 mg/0.3 mL IJ SOAJ injection Inject 0.3 mg into the muscle as needed for anaphylaxis.  ? eszopiclone (LUNESTA) 2 MG TABS tablet Take 1 tablet by mouth at bedtime as needed for sleep. Take immediately before bedtime (Patient not taking: Reported on 05/30/2021)  ? fenofibrate (TRICOR) 145 MG tablet TAKE 1 TABLET (145MG  TOTAL) BY MOUTH EVERY DAY. PLEASE SCHEDULE FOLLOW UP FOR FUTURE REFILLS  ? ferrous sulfate 325 (65 FE) MG tablet Take 1 tablet (325 mg total)  by mouth daily with breakfast.  ? hyoscyamine (LEVSIN SL) 0.125 MG SL tablet PLACE 1 TABLET UNDER THE TONGUE EVERY 6 (SIX) HOURS AS NEEDED.  ? MELATONIN PO Take 10 mg by mouth daily.  ? metFORMIN (GLUCOPHAGE) 1000 MG tablet TAKE 1 TABLET BY MOUTH TWICE A DAY WITH MEALS  ? mirtazapine (REMERON SOL-TAB) 15 MG disintegrating tablet Dissolve 2 tablets by mouth nightly for 2 weeks and 1 tablet nightly for 1 week then stop (Patient taking differently: 45 mg. Dissolve 2 tablets by mouth nightly for 2 weeks and 1 tablet  nightly for 1 week then stop)  ? Multiple Vitamin (MULTIVITAMIN) tablet Take 1 tablet by mouth daily.    ? nitrofurantoin, macrocrystal-monohydrate, (MACROBID) 100 MG capsule TAKE 1 CAPSULE BY MOUTH ONCE FOR 1 DOSE. WITH INTERCOURSE  ? norethindrone-ethinyl estradiol (JUNEL 1/20) 1-20 MG-MCG tablet Take 1 tablet by mouth daily. Take ACTIVE pills only, skipping PLACEBOS  ? omeprazole (PRILOSEC) 40 MG capsule TAKE 1 CAPSULE BY MOUTH EVERY DAY  ? ondansetron (ZOFRAN) 8 MG tablet Take 1 tablet (8 mg total) by mouth 2 (two) times daily as needed for nausea or vomiting.  ? oxyCODONE-acetaminophen (PERCOCET/ROXICET) 5-325 MG tablet Take 1 tablet by mouth every 8 (eight) hours as needed for severe pain.  ? pregabalin (LYRICA) 150 MG capsule TAKE 1 CAPSULE BY MOUTH TWICE A DAY  ? Probiotic Product (PROBIOTIC-10 PO) Take 1 capsule by mouth daily.  ? promethazine (PHENERGAN) 12.5 MG tablet Take by mouth daily as needed.  ? SUMAtriptan (IMITREX) 25 MG tablet Take 25 mg by mouth every 2 (two) hours as needed for migraine. May repeat in 2 hours if headache persists or recurs.  ? tiZANidine (ZANAFLEX) 4 MG tablet TAKE 1 TABLET BY MOUTH EVERY 8 HOURS AS NEEDED FOR MUSCLE SPASMS  ? valACYclovir (VALTREX) 1000 MG tablet Take 1 tablet (1,000 mg total) by mouth daily.  ? ?No facility-administered encounter medications on file as of 07/02/2021.  ? ? ?Allergies (verified) ?Alpha-gal; Beef-derived products; Glycerol, iodinated; Milk-related compounds; and Pork-derived products  ? ?History: ?Past Medical History:  ?Diagnosis Date  ? ANXIETY   ? BACK PAIN, CHRONIC   ? DYSAUTONOMIA 2011  ? "POTS" , recurrent syncope - follows with cards for same  ? GERD   ? HSV (herpes simplex virus) anogenital infection 06/2015  ? cervix swab  ? HYPERLIPIDEMIA   ? Kidney failure   ? MIGRAINE HEADACHE   ? Palpitations   ? POLYCYSTIC OVARIAN DISEASE   ? POTS (postural orthostatic tachycardia syndrome)   ? Recurrent upper respiratory infection (URI)   ? Tear  meniscus knee 06/14/2010  ? right  ? ?Past Surgical History:  ?Procedure Laterality Date  ? Cervical spine  2010  ? ACDF c5-6  ? crevical fusion   2010  ? evs    ? LUMBAR LAMINECTOMY  2008  ? TONSILLECTOMY AND ADENOIDECTOMY    ? ?Family History  ?Problem Relation Age of Onset  ? Bipolar disorder Mother   ? Stroke Mother   ?     Age of 62 while on OCP  ? Other Father   ?     MVA  ? Heart disease Father   ? Heart attack Father   ? Colon cancer Maternal Grandmother [phone]  ? Cancer Maternal Grandmother   ? Hypertension Sister   ? Hypertension Sister   ? Migraines Sister   ? Breast cancer Paternal Grandmother   ?     dx in her 79s-40s  ?  Cancer Paternal Uncle   ?     ocular  ? Breast cancer Paternal Aunt [id number]  ? Lung cancer Paternal Uncle   ? ?Social History  ? ?Socioeconomic History  ? Marital status: Married  ?  Spouse name: Not on file  ? Number of children: Not on file  ? Years of education: Not on file  ? Highest education level: Not on file  ?Occupational History  ? Occupation: part-time Art gallery managerChiopractor  ?Tobacco Use  ? Smoking status: Every Day  ?  Packs/day: 0.10  ?  Years: 10.00  ?  Pack years: 1.00  ?  Types: Cigarettes  ? Smokeless tobacco: Never  ? Tobacco comments:  ?  3-4 cigarettes per day  ?Vaping Use  ? Vaping Use: Never used  ?Substance and Sexual Activity  ? Alcohol use: Yes  ?  Alcohol/week: 0.0 standard drinks  ?  Comment: occasionally  ? Drug use: No  ? Sexual activity: Yes  ?  Partners: Male  ?  Comment: 1st intercourse 41 yo-More than 5 partners, married- 1 yr  ?Other Topics Concern  ? Not on file  ?Social History Narrative  ? Disability since 10/2012 due to POTS, 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[employer]  ?   ? Some exercise on a regular exericse  ? ?Social Determinants of Health  ? ?Financial Resource Strain: Low Risk   ? Difficulty of Paying Living Expenses: Not hard at all  ?Food Insecurity: No Food Insecurity  ? Worried About Programme researcher, broadcasting/film/videounning Out of Food in the Last Year: Never true  ? Ran Out of Food in the Last Year: Never  true  ?Transportation Needs: No Transportation Needs  ? Lack of Transportation (Medical): No  ? Lack of Transportation (Non-Medical): No  ?Physical Activity: Inactive  ? Days of Exercise per Week: 0 da

## 2021-07-08 ENCOUNTER — Other Ambulatory Visit: Payer: Self-pay | Admitting: Internal Medicine

## 2021-07-19 DIAGNOSIS — G43719 Chronic migraine without aura, intractable, without status migrainosus: Secondary | ICD-10-CM | POA: Diagnosis not present

## 2021-08-02 DIAGNOSIS — M62838 Other muscle spasm: Secondary | ICD-10-CM | POA: Diagnosis not present

## 2021-08-02 DIAGNOSIS — M9902 Segmental and somatic dysfunction of thoracic region: Secondary | ICD-10-CM | POA: Diagnosis not present

## 2021-08-02 DIAGNOSIS — M545 Low back pain, unspecified: Secondary | ICD-10-CM | POA: Diagnosis not present

## 2021-08-02 DIAGNOSIS — M9903 Segmental and somatic dysfunction of lumbar region: Secondary | ICD-10-CM | POA: Diagnosis not present

## 2021-08-02 DIAGNOSIS — M542 Cervicalgia: Secondary | ICD-10-CM | POA: Diagnosis not present

## 2021-08-02 DIAGNOSIS — M9901 Segmental and somatic dysfunction of cervical region: Secondary | ICD-10-CM | POA: Diagnosis not present

## 2021-08-02 DIAGNOSIS — M62830 Muscle spasm of back: Secondary | ICD-10-CM | POA: Diagnosis not present

## 2021-08-02 DIAGNOSIS — M546 Pain in thoracic spine: Secondary | ICD-10-CM | POA: Diagnosis not present

## 2021-08-15 DIAGNOSIS — M62830 Muscle spasm of back: Secondary | ICD-10-CM | POA: Diagnosis not present

## 2021-08-15 DIAGNOSIS — M542 Cervicalgia: Secondary | ICD-10-CM | POA: Diagnosis not present

## 2021-08-15 DIAGNOSIS — M9903 Segmental and somatic dysfunction of lumbar region: Secondary | ICD-10-CM | POA: Diagnosis not present

## 2021-08-15 DIAGNOSIS — M546 Pain in thoracic spine: Secondary | ICD-10-CM | POA: Diagnosis not present

## 2021-08-15 DIAGNOSIS — M62838 Other muscle spasm: Secondary | ICD-10-CM | POA: Diagnosis not present

## 2021-08-15 DIAGNOSIS — M9902 Segmental and somatic dysfunction of thoracic region: Secondary | ICD-10-CM | POA: Diagnosis not present

## 2021-08-15 DIAGNOSIS — M9901 Segmental and somatic dysfunction of cervical region: Secondary | ICD-10-CM | POA: Diagnosis not present

## 2021-08-15 DIAGNOSIS — M545 Low back pain, unspecified: Secondary | ICD-10-CM | POA: Diagnosis not present

## 2021-08-16 DIAGNOSIS — M546 Pain in thoracic spine: Secondary | ICD-10-CM | POA: Diagnosis not present

## 2021-08-16 DIAGNOSIS — M62830 Muscle spasm of back: Secondary | ICD-10-CM | POA: Diagnosis not present

## 2021-08-16 DIAGNOSIS — M542 Cervicalgia: Secondary | ICD-10-CM | POA: Diagnosis not present

## 2021-08-16 DIAGNOSIS — M545 Low back pain, unspecified: Secondary | ICD-10-CM | POA: Diagnosis not present

## 2021-08-16 DIAGNOSIS — M9902 Segmental and somatic dysfunction of thoracic region: Secondary | ICD-10-CM | POA: Diagnosis not present

## 2021-08-16 DIAGNOSIS — M9903 Segmental and somatic dysfunction of lumbar region: Secondary | ICD-10-CM | POA: Diagnosis not present

## 2021-08-16 DIAGNOSIS — M9901 Segmental and somatic dysfunction of cervical region: Secondary | ICD-10-CM | POA: Diagnosis not present

## 2021-08-16 DIAGNOSIS — M62838 Other muscle spasm: Secondary | ICD-10-CM | POA: Diagnosis not present

## 2021-08-22 DIAGNOSIS — M542 Cervicalgia: Secondary | ICD-10-CM | POA: Diagnosis not present

## 2021-08-22 DIAGNOSIS — M9901 Segmental and somatic dysfunction of cervical region: Secondary | ICD-10-CM | POA: Diagnosis not present

## 2021-08-22 DIAGNOSIS — M546 Pain in thoracic spine: Secondary | ICD-10-CM | POA: Diagnosis not present

## 2021-08-22 DIAGNOSIS — M62830 Muscle spasm of back: Secondary | ICD-10-CM | POA: Diagnosis not present

## 2021-08-22 DIAGNOSIS — M9902 Segmental and somatic dysfunction of thoracic region: Secondary | ICD-10-CM | POA: Diagnosis not present

## 2021-08-22 DIAGNOSIS — M9903 Segmental and somatic dysfunction of lumbar region: Secondary | ICD-10-CM | POA: Diagnosis not present

## 2021-08-22 DIAGNOSIS — M545 Low back pain, unspecified: Secondary | ICD-10-CM | POA: Diagnosis not present

## 2021-08-22 DIAGNOSIS — M62838 Other muscle spasm: Secondary | ICD-10-CM | POA: Diagnosis not present

## 2021-08-27 DIAGNOSIS — M545 Low back pain, unspecified: Secondary | ICD-10-CM | POA: Diagnosis not present

## 2021-08-27 DIAGNOSIS — M9903 Segmental and somatic dysfunction of lumbar region: Secondary | ICD-10-CM | POA: Diagnosis not present

## 2021-08-27 DIAGNOSIS — M542 Cervicalgia: Secondary | ICD-10-CM | POA: Diagnosis not present

## 2021-08-27 DIAGNOSIS — M9902 Segmental and somatic dysfunction of thoracic region: Secondary | ICD-10-CM | POA: Diagnosis not present

## 2021-08-27 DIAGNOSIS — M62830 Muscle spasm of back: Secondary | ICD-10-CM | POA: Diagnosis not present

## 2021-08-27 DIAGNOSIS — M546 Pain in thoracic spine: Secondary | ICD-10-CM | POA: Diagnosis not present

## 2021-08-27 DIAGNOSIS — M9901 Segmental and somatic dysfunction of cervical region: Secondary | ICD-10-CM | POA: Diagnosis not present

## 2021-08-27 DIAGNOSIS — M62838 Other muscle spasm: Secondary | ICD-10-CM | POA: Diagnosis not present

## 2021-08-29 DIAGNOSIS — M62830 Muscle spasm of back: Secondary | ICD-10-CM | POA: Diagnosis not present

## 2021-08-29 DIAGNOSIS — M9901 Segmental and somatic dysfunction of cervical region: Secondary | ICD-10-CM | POA: Diagnosis not present

## 2021-08-29 DIAGNOSIS — M542 Cervicalgia: Secondary | ICD-10-CM | POA: Diagnosis not present

## 2021-08-29 DIAGNOSIS — M9903 Segmental and somatic dysfunction of lumbar region: Secondary | ICD-10-CM | POA: Diagnosis not present

## 2021-08-29 DIAGNOSIS — M545 Low back pain, unspecified: Secondary | ICD-10-CM | POA: Diagnosis not present

## 2021-08-29 DIAGNOSIS — M62838 Other muscle spasm: Secondary | ICD-10-CM | POA: Diagnosis not present

## 2021-08-29 DIAGNOSIS — M546 Pain in thoracic spine: Secondary | ICD-10-CM | POA: Diagnosis not present

## 2021-08-29 DIAGNOSIS — M9902 Segmental and somatic dysfunction of thoracic region: Secondary | ICD-10-CM | POA: Diagnosis not present

## 2021-08-30 DIAGNOSIS — M9902 Segmental and somatic dysfunction of thoracic region: Secondary | ICD-10-CM | POA: Diagnosis not present

## 2021-08-30 DIAGNOSIS — M9903 Segmental and somatic dysfunction of lumbar region: Secondary | ICD-10-CM | POA: Diagnosis not present

## 2021-08-30 DIAGNOSIS — M542 Cervicalgia: Secondary | ICD-10-CM | POA: Diagnosis not present

## 2021-08-30 DIAGNOSIS — M62838 Other muscle spasm: Secondary | ICD-10-CM | POA: Diagnosis not present

## 2021-08-30 DIAGNOSIS — M62830 Muscle spasm of back: Secondary | ICD-10-CM | POA: Diagnosis not present

## 2021-08-30 DIAGNOSIS — M9901 Segmental and somatic dysfunction of cervical region: Secondary | ICD-10-CM | POA: Diagnosis not present

## 2021-08-30 DIAGNOSIS — M545 Low back pain, unspecified: Secondary | ICD-10-CM | POA: Diagnosis not present

## 2021-08-30 DIAGNOSIS — M546 Pain in thoracic spine: Secondary | ICD-10-CM | POA: Diagnosis not present

## 2021-09-03 DIAGNOSIS — M62838 Other muscle spasm: Secondary | ICD-10-CM | POA: Diagnosis not present

## 2021-09-03 DIAGNOSIS — M62830 Muscle spasm of back: Secondary | ICD-10-CM | POA: Diagnosis not present

## 2021-09-03 DIAGNOSIS — M542 Cervicalgia: Secondary | ICD-10-CM | POA: Diagnosis not present

## 2021-09-03 DIAGNOSIS — M545 Low back pain, unspecified: Secondary | ICD-10-CM | POA: Diagnosis not present

## 2021-09-03 DIAGNOSIS — M9901 Segmental and somatic dysfunction of cervical region: Secondary | ICD-10-CM | POA: Diagnosis not present

## 2021-09-03 DIAGNOSIS — M9903 Segmental and somatic dysfunction of lumbar region: Secondary | ICD-10-CM | POA: Diagnosis not present

## 2021-09-03 DIAGNOSIS — M546 Pain in thoracic spine: Secondary | ICD-10-CM | POA: Diagnosis not present

## 2021-09-03 DIAGNOSIS — M9902 Segmental and somatic dysfunction of thoracic region: Secondary | ICD-10-CM | POA: Diagnosis not present

## 2021-09-05 DIAGNOSIS — M62830 Muscle spasm of back: Secondary | ICD-10-CM | POA: Diagnosis not present

## 2021-09-05 DIAGNOSIS — M542 Cervicalgia: Secondary | ICD-10-CM | POA: Diagnosis not present

## 2021-09-05 DIAGNOSIS — M545 Low back pain, unspecified: Secondary | ICD-10-CM | POA: Diagnosis not present

## 2021-09-05 DIAGNOSIS — M9902 Segmental and somatic dysfunction of thoracic region: Secondary | ICD-10-CM | POA: Diagnosis not present

## 2021-09-05 DIAGNOSIS — M9903 Segmental and somatic dysfunction of lumbar region: Secondary | ICD-10-CM | POA: Diagnosis not present

## 2021-09-05 DIAGNOSIS — M9901 Segmental and somatic dysfunction of cervical region: Secondary | ICD-10-CM | POA: Diagnosis not present

## 2021-09-05 DIAGNOSIS — M62838 Other muscle spasm: Secondary | ICD-10-CM | POA: Diagnosis not present

## 2021-09-05 DIAGNOSIS — M546 Pain in thoracic spine: Secondary | ICD-10-CM | POA: Diagnosis not present

## 2021-09-06 ENCOUNTER — Ambulatory Visit: Payer: Medicare Other | Admitting: Allergy

## 2021-09-10 DIAGNOSIS — M62838 Other muscle spasm: Secondary | ICD-10-CM | POA: Diagnosis not present

## 2021-09-10 DIAGNOSIS — M9901 Segmental and somatic dysfunction of cervical region: Secondary | ICD-10-CM | POA: Diagnosis not present

## 2021-09-10 DIAGNOSIS — M542 Cervicalgia: Secondary | ICD-10-CM | POA: Diagnosis not present

## 2021-09-10 DIAGNOSIS — M9902 Segmental and somatic dysfunction of thoracic region: Secondary | ICD-10-CM | POA: Diagnosis not present

## 2021-09-10 DIAGNOSIS — M545 Low back pain, unspecified: Secondary | ICD-10-CM | POA: Diagnosis not present

## 2021-09-10 DIAGNOSIS — M9903 Segmental and somatic dysfunction of lumbar region: Secondary | ICD-10-CM | POA: Diagnosis not present

## 2021-09-10 DIAGNOSIS — M62830 Muscle spasm of back: Secondary | ICD-10-CM | POA: Diagnosis not present

## 2021-09-10 DIAGNOSIS — M546 Pain in thoracic spine: Secondary | ICD-10-CM | POA: Diagnosis not present

## 2021-09-12 DIAGNOSIS — M62838 Other muscle spasm: Secondary | ICD-10-CM | POA: Diagnosis not present

## 2021-09-12 DIAGNOSIS — M9903 Segmental and somatic dysfunction of lumbar region: Secondary | ICD-10-CM | POA: Diagnosis not present

## 2021-09-12 DIAGNOSIS — M9902 Segmental and somatic dysfunction of thoracic region: Secondary | ICD-10-CM | POA: Diagnosis not present

## 2021-09-12 DIAGNOSIS — M9901 Segmental and somatic dysfunction of cervical region: Secondary | ICD-10-CM | POA: Diagnosis not present

## 2021-09-12 DIAGNOSIS — M546 Pain in thoracic spine: Secondary | ICD-10-CM | POA: Diagnosis not present

## 2021-09-12 DIAGNOSIS — M62830 Muscle spasm of back: Secondary | ICD-10-CM | POA: Diagnosis not present

## 2021-09-12 DIAGNOSIS — M545 Low back pain, unspecified: Secondary | ICD-10-CM | POA: Diagnosis not present

## 2021-09-12 DIAGNOSIS — M542 Cervicalgia: Secondary | ICD-10-CM | POA: Diagnosis not present

## 2021-09-13 DIAGNOSIS — M9902 Segmental and somatic dysfunction of thoracic region: Secondary | ICD-10-CM | POA: Diagnosis not present

## 2021-09-13 DIAGNOSIS — M546 Pain in thoracic spine: Secondary | ICD-10-CM | POA: Diagnosis not present

## 2021-09-13 DIAGNOSIS — M542 Cervicalgia: Secondary | ICD-10-CM | POA: Diagnosis not present

## 2021-09-13 DIAGNOSIS — M62830 Muscle spasm of back: Secondary | ICD-10-CM | POA: Diagnosis not present

## 2021-09-13 DIAGNOSIS — M9901 Segmental and somatic dysfunction of cervical region: Secondary | ICD-10-CM | POA: Diagnosis not present

## 2021-09-13 DIAGNOSIS — M545 Low back pain, unspecified: Secondary | ICD-10-CM | POA: Diagnosis not present

## 2021-09-13 DIAGNOSIS — M62838 Other muscle spasm: Secondary | ICD-10-CM | POA: Diagnosis not present

## 2021-09-13 DIAGNOSIS — M9903 Segmental and somatic dysfunction of lumbar region: Secondary | ICD-10-CM | POA: Diagnosis not present

## 2021-09-20 DIAGNOSIS — H04123 Dry eye syndrome of bilateral lacrimal glands: Secondary | ICD-10-CM | POA: Diagnosis not present

## 2021-09-20 DIAGNOSIS — H5213 Myopia, bilateral: Secondary | ICD-10-CM | POA: Diagnosis not present

## 2021-09-20 DIAGNOSIS — H40053 Ocular hypertension, bilateral: Secondary | ICD-10-CM | POA: Diagnosis not present

## 2021-09-20 DIAGNOSIS — G43009 Migraine without aura, not intractable, without status migrainosus: Secondary | ICD-10-CM | POA: Diagnosis not present

## 2021-10-04 ENCOUNTER — Other Ambulatory Visit: Payer: Self-pay | Admitting: Internal Medicine

## 2021-10-06 ENCOUNTER — Other Ambulatory Visit: Payer: Self-pay | Admitting: Internal Medicine

## 2021-10-18 DIAGNOSIS — G43719 Chronic migraine without aura, intractable, without status migrainosus: Secondary | ICD-10-CM | POA: Diagnosis not present

## 2021-10-30 ENCOUNTER — Encounter: Payer: Medicare Other | Admitting: Internal Medicine

## 2021-11-02 ENCOUNTER — Other Ambulatory Visit: Payer: Self-pay | Admitting: Internal Medicine

## 2021-11-29 ENCOUNTER — Telehealth: Payer: Medicare Other

## 2021-12-08 ENCOUNTER — Other Ambulatory Visit: Payer: Self-pay | Admitting: Internal Medicine

## 2021-12-11 ENCOUNTER — Other Ambulatory Visit: Payer: Self-pay | Admitting: Nurse Practitioner

## 2021-12-11 ENCOUNTER — Other Ambulatory Visit: Payer: Self-pay | Admitting: Internal Medicine

## 2021-12-11 DIAGNOSIS — N390 Urinary tract infection, site not specified: Secondary | ICD-10-CM

## 2021-12-11 NOTE — Telephone Encounter (Signed)
AEX 05/09/2021.

## 2021-12-12 ENCOUNTER — Other Ambulatory Visit: Payer: Self-pay | Admitting: Internal Medicine

## 2021-12-12 ENCOUNTER — Encounter: Payer: Self-pay | Admitting: Internal Medicine

## 2021-12-12 ENCOUNTER — Other Ambulatory Visit: Payer: Self-pay

## 2021-12-12 MED ORDER — PREGABALIN 150 MG PO CAPS: 150.0000 mg | ORAL_CAPSULE | Freq: Two times a day (BID) | ORAL | 0 refills | Status: DC

## 2021-12-13 ENCOUNTER — Other Ambulatory Visit: Payer: Self-pay | Admitting: Internal Medicine

## 2021-12-29 ENCOUNTER — Other Ambulatory Visit: Payer: Self-pay | Admitting: Internal Medicine

## 2022-01-10 ENCOUNTER — Other Ambulatory Visit: Payer: Self-pay | Admitting: Internal Medicine

## 2022-01-14 ENCOUNTER — Encounter: Payer: Self-pay | Admitting: Internal Medicine

## 2022-01-14 DIAGNOSIS — D649 Anemia, unspecified: Secondary | ICD-10-CM | POA: Insufficient documentation

## 2022-01-14 NOTE — Progress Notes (Signed)
Subjective:    Patient ID: Kayla Floyd, female    DOB: 11-27-1980, 41 y.o.   MRN: [id number]      HPI Kayla Floyd is here for a Physical exam.     Acute mid back pain - started Thursday or Friday  - it was severe.  She treated it with percocet she had a home.  She took one - got nauseous and vomited so I did not take anymore.  The next day it felt better.  The next couple of days it was back.  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  Today she is sore.  She does state decreased urination, which is not acute.  She denies other urine symptoms.  She has had something similar in the past and was concerned about her kidneys.  Since she was here last she did adopt her baby girl.  Everything is going well.   Medications and allergies reviewed with patient and updated if appropriate.  Current Outpatient Medications on File Prior to Visit  Medication Sig Dispense Refill   acetaminophen (TYLENOL) 325 MG tablet Take 2 tablets (650 mg total) by mouth every 6 (six) hours as needed (pain).     atenolol (TENORMIN) 25 MG tablet TAKE 1 TABLET BY MOUTH EVERY DAY 30 tablet 2   atorvastatin (LIPITOR) 10 MG tablet TAKE 1 TABLET BY MOUTH EVERY DAY 30 tablet 5   b complex vitamins tablet Take 1 tablet by mouth daily.     baclofen (LIORESAL) 10 MG tablet TAKE 1 TABLET BY MOUTH THREE TIMES A DAY AS NEEDED FOR MUSCLE SPASM 90 tablet 2   BOTOX 200 units SOLR      botulinum toxin Type A (BOTOX) 100 units SOLR injection Inject 100 Units into the muscle every 3 (three) months. Every 12 weeks at [place] [facility] 2 vial    cetirizine (ZYRTEC) 10 MG tablet Take 10 mg by mouth daily. Reported on 07/13/2015     DULoxetine (CYMBALTA) 30 MG capsule TAKE 1 CAPSULE BY MOUTH DAILY. TAKE DAILY IN ADDITION TO 60 MG DAILY FOR 90 MG DAILY 30 capsule 3   DULoxetine (CYMBALTA) 60 MG capsule TAKE 1 CAPSULE BY MOUTH EVERY DAY. 30 capsule 2   EPINEPHrine 0.3 mg/0.3 mL IJ SOAJ  injection Inject 0.3 mg into the muscle as needed for anaphylaxis. 1 each 5   fenofibrate (TRICOR) 145 MG tablet TAKE 1 TABLET (145MG  TOTAL) BY MOUTH EVERY DAY. PLEASE SCHEDULE FOLLOW UP FOR FUTURE REFILLS 30 tablet 0   hyoscyamine (LEVSIN SL) 0.125 MG SL tablet PLACE 1 TABLET UNDER THE TONGUE EVERY 6 (SIX) HOURS AS NEEDED. 120 tablet 1   metFORMIN (GLUCOPHAGE) 1000 MG tablet TAKE 1 TABLET BY MOUTH TWICE A DAY WITH MEALS 60 tablet 5   mirtazapine (REMERON) 45 MG tablet TAKE 1 TABLET BY MOUTH EVERYDAY AT BEDTIME 30 tablet 2   Multiple Vitamin (MULTIVITAMIN) tablet Take 1 tablet by mouth daily.       nitrofurantoin, macrocrystal-monohydrate, (MACROBID) 100 MG capsule TAKE 1 CAPSULE BY MOUTH ONCE FOR 1 DOSE. WITH INTERCOURSE 30 capsule 1   norethindrone-ethinyl estradiol (JUNEL 1/20) 1-20 MG-MCG tablet Take 1 tablet by mouth daily. Take ACTIVE pills only, skipping PLACEBOS 112 tablet 3   omeprazole (PRILOSEC) 40 MG capsule TAKE 1 CAPSULE BY MOUTH EVERY DAY 30 capsule 5   ondansetron (ZOFRAN) 8 MG tablet Take 1 tablet (8 mg total) by mouth 2 (two)  times daily as needed for nausea or vomiting. 20 tablet 0   pregabalin (LYRICA) 150 MG capsule TAKE 1 CAPSULE BY MOUTH TWICE A DAY 15 capsule 0   Probiotic Product (PROBIOTIC-10 PO) Take 1 capsule by mouth daily.     promethazine (PHENERGAN) 12.5 MG tablet Take by mouth daily as needed.     SUMAtriptan (IMITREX) 25 MG tablet Take 25 mg by mouth every 2 (two) hours as needed for migraine. May repeat in 2 hours if headache persists or recurs.     tiZANidine (ZANAFLEX) 4 MG tablet TAKE 1 TABLET BY MOUTH EVERY 8 HOURS AS NEEDED FOR MUSCLE SPASMS 90 tablet 1   valACYclovir (VALTREX) 1000 MG tablet Take 1 tablet (1,000 mg total) by mouth daily. 90 tablet 3   No current facility-administered medications on file prior to visit.    Review of Systems  Constitutional:  Negative for fever.  Eyes:  Negative for visual disturbance.  Respiratory:  Positive for  shortness of breath (chronic - no change). Negative for cough and wheezing.   Cardiovascular:  Positive for chest pain (chronic - no change) and palpitations (chronic - no change). Negative for leg swelling.  Gastrointestinal:  Positive for diarrhea (chronic - no change) and nausea. Negative for abdominal pain, blood in stool and constipation.       Gerd controlled, occ flare  Genitourinary:  Positive for decreased urine volume. Negative for difficulty urinating, dysuria, frequency and hematuria.  Musculoskeletal:  Positive for arthralgias and back pain.  Skin:  Negative for rash.  Neurological:  Positive for light-headedness and headaches.  Psychiatric/Behavioral:  Positive for dysphoric mood (controlled). The patient is nervous/anxious (increased).        Objective:   Vitals:   01/15/22 1407  BP: 102/78  Pulse: (!) 102  Temp: 98.1 F (36.7 C)  SpO2: 98%   Filed Weights   01/15/22 1407  Weight: 198 lb (89.8 kg)   Body mass index is 32.95 kg/m.  BP Readings from Last 3 Encounters:  01/15/22 102/78  06/15/21 137/90  05/09/21 118/74    Wt Readings from Last 3 Encounters:  01/15/22 198 lb (89.8 kg)  06/15/21 188 lb 15 oz (85.7 kg)  05/09/21 189 lb (85.7 kg)       Physical Exam Constitutional: She appears well-developed and well-nourished. No distress.  HENT:  Head: Normocephalic and atraumatic.  Right Ear: External ear normal. Normal ear canal and TM Left Ear: External ear normal.  Normal ear canal and TM Mouth/Throat: Oropharynx is clear and moist.  Eyes: Conjunctivae normal.  Neck: Neck supple. No tracheal deviation present. No thyromegaly present.  No carotid bruit  Cardiovascular: Normal rate, regular rhythm and normal heart sounds.   No murmur heard.  No edema. Pulmonary/Chest: Effort normal and breath sounds normal. No respiratory distress. She has no wheezes. She has no rales.  Breast: deferred   Abdominal: Soft. She exhibits no distension. There is no  tenderness.  Lymphadenopathy: She has no cervical adenopathy.  Skin: Skin is warm and dry. She is not diaphoretic.  Psychiatric: She has a normal mood and affect. Her behavior is normal.     Lab Results  Component Value Date   WBC 6.8 01/15/2022   HGB 9.7 (L) 01/15/2022   HCT 29.9 (L) 01/15/2022   PLT 303.0 01/15/2022   GLUCOSE 93 01/15/2022   CHOL 178 01/15/2022   TRIG (H) 01/15/2022    412.0 Triglyceride is over 400; calculations on Lipids are invalid.   HDL 30.50 (  L) 01/15/2022   LDLDIRECT 112.0 01/15/2022   LDLCALC 92 04/07/2013   ALT 14 01/15/2022   AST 23 01/15/2022   NA 138 01/15/2022   K 3.8 01/15/2022   CL 103 01/15/2022   CREATININE 1.00 01/15/2022   BUN 11 01/15/2022   CO2 26 01/15/2022   TSH 1.23 01/15/2022   INR 0.9 10/17/2020   HGBA1C 6.3 01/15/2022         Assessment & Plan:   Physical exam: Screening blood work  ordered Exercise  not really Weight  encouraged weight loss   Reviewed recommended immunizations.   Health Maintenance  Topic Date Due   INFLUENZA VACCINE  06/23/2022 (Originally 10/23/2021)   Medicare Annual Wellness (AWV)  08/02/2022   PAP SMEAR-Modifier  05/09/2024   TETANUS/TDAP  09/09/2031   COVID-19 Vaccine  Completed   Hepatitis C Screening  Completed   HIV Screening  Completed   HPV VACCINES  Aged Out          See Problem List for Assessment and Plan of chronic medical problems.

## 2022-01-14 NOTE — Patient Instructions (Addendum)
Blood work was ordered.   The lab is on the first floor.    Medications changes include :   none     Return in about 6 months (around 07/17/2022) for follow up.   Health Maintenance, Female Adopting a healthy lifestyle and getting preventive care are important in promoting health and wellness. Ask your health care provider about: The right schedule for you to have regular tests and exams. Things you can do on your own to prevent diseases and keep yourself healthy. What should I know about diet, weight, and exercise? Eat a healthy diet  Eat a diet that includes plenty of vegetables, fruits, low-fat dairy products, and lean protein. Do not eat a lot of foods that are high in solid fats, added sugars, or sodium. Maintain a healthy weight Body mass index (BMI) is used to identify weight problems. It estimates body fat based on height and weight. Your health care provider can help determine your BMI and help you achieve or maintain a healthy weight. Get regular exercise Get regular exercise. This is one of the most important things you can do for your health. Most adults should: Exercise for at least 150 minutes each week. The exercise should increase your heart rate and make you sweat (moderate-intensity exercise). Do strengthening exercises at least twice a week. This is in addition to the moderate-intensity exercise. Spend less time sitting. Even light physical activity can be beneficial. Watch cholesterol and blood lipids Have your blood tested for lipids and cholesterol at 42 years of age, then have this test every 5 years. Have your cholesterol levels checked more often if: Your lipid or cholesterol levels are high. You are older than 42 years of age. You are at high risk for heart disease. What should I know about cancer screening? Depending on your health history and family history, you may need to have cancer screening at various ages. This may include screening  for: Breast cancer. Cervical cancer. Colorectal cancer. Skin cancer. Lung cancer. What should I know about heart disease, diabetes, and high blood pressure? Blood pressure and heart disease High blood pressure causes heart disease and increases the risk of stroke. This is more likely to develop in people who have high blood pressure readings or are overweight. Have your blood pressure checked: Every 3-5 years if you are 55-91 years of age. Every year if you are 74 years old or older. Diabetes Have regular diabetes screenings. This checks your fasting blood sugar level. Have the screening done: Once every three years after age 53 if you are at a normal weight and have a low risk for diabetes. More often and at a younger age if you are overweight or have a high risk for diabetes. What should I know about preventing infection? Hepatitis B If you have a higher risk for hepatitis B, you should be screened for this virus. Talk with your health care provider to find out if you are at risk for hepatitis B infection. Hepatitis C Testing is recommended for: Everyone born from [birth year] through 1965. Anyone with known risk factors for hepatitis C. Sexually transmitted infections (STIs) Get screened for STIs, including gonorrhea and chlamydia, if: You are sexually active and are younger than 42 years of age. You are older than 42 years of age and your health care provider tells you that you are at risk for this type of infection. Your sexual activity has changed since you were last screened, and you are at  increased risk for chlamydia or gonorrhea. Ask your health care provider if you are at risk. Ask your health care provider about whether you are at high risk for HIV. Your health care provider may recommend a prescription medicine to help prevent HIV infection. If you choose to take medicine to prevent HIV, you should first get tested for HIV. You should then be tested every 3 months for as long as you  are taking the medicine. Pregnancy If you are about to stop having your period (premenopausal) and you may become pregnant, seek counseling before you get pregnant. Take 400 to 800 micrograms (mcg) of folic acid every day if you become pregnant. Ask for birth control (contraception) if you want to prevent pregnancy. Osteoporosis and menopause Osteoporosis is a disease in which the bones lose minerals and strength with aging. This can result in bone fractures. If you are 62 years old or older, or if you are at risk for osteoporosis and fractures, ask your health care provider if you should: Be screened for bone loss. Take a calcium or vitamin D supplement to lower your risk of fractures. Be given hormone replacement therapy (HRT) to treat symptoms of menopause. Follow these instructions at home: Alcohol use Do not drink alcohol if: Your health care provider tells you not to drink. You are pregnant, may be pregnant, or are planning to become pregnant. If you drink alcohol: Limit how much you have to: 0-1 drink a day. Know how much alcohol is in your drink. In the [place] one drink equals one 12 oz bottle of beer (355 mL), one 5 oz glass of wine (148 mL), or one 1 oz glass of hard liquor (44 mL). Lifestyle Do not use any products that contain nicotine or tobacco. These products include cigarettes, chewing tobacco, and vaping devices, such as e-cigarettes. If you need help quitting, ask your health care provider. Do not use street drugs. Do not share needles. Ask your health care provider for help if you need support or information about quitting drugs. General instructions Schedule regular health, dental, and eye exams. Stay current with your vaccines. Tell your health care provider if: You often feel depressed. You have ever been abused or do not feel safe at home. Summary Adopting a healthy lifestyle and getting preventive care are important in promoting health and wellness. Follow your  health care provider's instructions about healthy diet, exercising, and getting tested or screened for diseases. Follow your health care provider's instructions on monitoring your cholesterol and blood pressure. This information is not intended to replace advice given to you by your health care provider. Make sure you discuss any questions you have with your health care provider. Document Revised: 07/31/2020 Document Reviewed: 07/31/2020 Elsevier Patient Education  [employer].

## 2022-01-15 ENCOUNTER — Ambulatory Visit (INDEPENDENT_AMBULATORY_CARE_PROVIDER_SITE_OTHER): Payer: Medicare Other | Admitting: Internal Medicine

## 2022-01-15 VITALS — BP 102/78 | HR 102 | Temp 98.1°F | Ht 65.0 in | Wt 198.0 lb

## 2022-01-15 DIAGNOSIS — G901 Familial dysautonomia [Riley-Day]: Secondary | ICD-10-CM | POA: Diagnosis not present

## 2022-01-15 DIAGNOSIS — G4709 Other insomnia: Secondary | ICD-10-CM

## 2022-01-15 DIAGNOSIS — Z0000 Encounter for general adult medical examination without abnormal findings: Secondary | ICD-10-CM

## 2022-01-15 DIAGNOSIS — I10 Essential (primary) hypertension: Secondary | ICD-10-CM | POA: Diagnosis not present

## 2022-01-15 DIAGNOSIS — M549 Dorsalgia, unspecified: Secondary | ICD-10-CM | POA: Diagnosis not present

## 2022-01-15 DIAGNOSIS — G8929 Other chronic pain: Secondary | ICD-10-CM

## 2022-01-15 DIAGNOSIS — E782 Mixed hyperlipidemia: Secondary | ICD-10-CM

## 2022-01-15 DIAGNOSIS — R34 Anuria and oliguria: Secondary | ICD-10-CM | POA: Insufficient documentation

## 2022-01-15 DIAGNOSIS — D649 Anemia, unspecified: Secondary | ICD-10-CM | POA: Diagnosis not present

## 2022-01-15 DIAGNOSIS — R7303 Prediabetes: Secondary | ICD-10-CM

## 2022-01-15 DIAGNOSIS — F3289 Other specified depressive episodes: Secondary | ICD-10-CM | POA: Diagnosis not present

## 2022-01-15 DIAGNOSIS — F419 Anxiety disorder, unspecified: Secondary | ICD-10-CM | POA: Diagnosis not present

## 2022-01-15 LAB — URINALYSIS, ROUTINE W REFLEX MICROSCOPIC
Bilirubin Urine: NEGATIVE
Hgb urine dipstick: NEGATIVE
Ketones, ur: NEGATIVE
Leukocytes,Ua: NEGATIVE
Nitrite: NEGATIVE
Specific Gravity, Urine: 1.015 (ref 1.000–1.030)
Total Protein, Urine: NEGATIVE
Urine Glucose: NEGATIVE
Urobilinogen, UA: 0.2 (ref 0.0–1.0)
pH: 6.5 (ref 5.0–8.0)

## 2022-01-15 LAB — TSH: TSH: 1.23 u[IU]/mL (ref 0.35–5.50)

## 2022-01-15 LAB — IBC PANEL
Iron: 33 ug/dL — ABNORMAL LOW (ref 42–145)
Saturation Ratios: 5.8 % — ABNORMAL LOW (ref 20.0–50.0)
TIBC: 569.8 ug/dL — ABNORMAL HIGH (ref 250.0–450.0)
Transferrin: 407 mg/dL — ABNORMAL HIGH (ref 212.0–360.0)

## 2022-01-15 LAB — COMPREHENSIVE METABOLIC PANEL
ALT: 14 U/L (ref 0–35)
AST: 23 U/L (ref 0–37)
Albumin: 4.3 g/dL (ref 3.5–5.2)
Alkaline Phosphatase: 27 U/L — ABNORMAL LOW (ref 39–117)
BUN: 11 mg/dL (ref 6–23)
CO2: 26 mEq/L (ref 19–32)
Calcium: 9.7 mg/dL (ref 8.4–10.5)
Chloride: 103 mEq/L (ref 96–112)
Creatinine, Ser: 1 mg/dL (ref 0.40–1.20)
GFR: 69.72 mL/min (ref >60.00)
Glucose, Bld: 93 mg/dL (ref 70–99)
Potassium: 3.8 mEq/L (ref 3.5–5.1)
Sodium: 138 mEq/L (ref 135–145)
Total Bilirubin: 0.2 mg/dL (ref 0.2–1.2)
Total Protein: 7.5 g/dL (ref 6.0–8.3)

## 2022-01-15 LAB — CBC WITH DIFFERENTIAL/PLATELET
Basophils Absolute: 0.1 10*3/uL (ref 0.0–0.1)
Basophils Relative: 0.8 % (ref 0.0–3.0)
Eosinophils Absolute: 0.2 10*3/uL (ref 0.0–0.7)
Eosinophils Relative: 2.2 % (ref 0.0–5.0)
HCT: 29.9 % — ABNORMAL LOW (ref 36.0–46.0)
Hemoglobin: 9.7 g/dL — ABNORMAL LOW (ref 12.0–15.0)
Lymphocytes Relative: 40 % (ref 12.0–46.0)
Lymphs Abs: 2.7 10*3/uL (ref 0.7–4.0)
MCHC: 32.3 g/dL (ref 30.0–36.0)
MCV: 72 fl — ABNORMAL LOW (ref 78.0–100.0)
Monocytes Absolute: 0.3 10*3/uL (ref 0.1–1.0)
Monocytes Relative: 5.1 % (ref 3.0–12.0)
Neutro Abs: 3.5 10*3/uL (ref 1.4–7.7)
Neutrophils Relative %: 51.9 % (ref 43.0–77.0)
Platelets: 303 10*3/uL (ref 150.0–400.0)
RBC: 4.16 Mil/uL (ref 3.87–5.11)
RDW: 16.4 % — ABNORMAL HIGH (ref 11.5–15.5)
WBC: 6.8 10*3/uL (ref 4.0–10.5)

## 2022-01-15 LAB — LIPID PANEL
Cholesterol: 178 mg/dL (ref 0–200)
HDL: 30.5 mg/dL — ABNORMAL LOW (ref >39.00)
Total CHOL/HDL Ratio: 6
Triglycerides: 412 mg/dL — ABNORMAL HIGH (ref 0.0–149.0)

## 2022-01-15 LAB — VITAMIN B12: Vitamin B-12: 370 pg/mL (ref 211–911)

## 2022-01-15 LAB — LDL CHOLESTEROL, DIRECT: Direct LDL: 112 mg/dL

## 2022-01-15 LAB — HEMOGLOBIN A1C: Hgb A1c MFr Bld: 6.3 % (ref 4.6–6.5)

## 2022-01-15 LAB — FOLATE: Folate: 20.5 ng/mL (ref >5.9)

## 2022-01-15 LAB — FERRITIN: Ferritin: 4.4 ng/mL — ABNORMAL LOW (ref 10.0–291.0)

## 2022-01-15 NOTE — Assessment & Plan Note (Signed)
Chronic Controlled, Stable Continue Cymbalta 90 mg daily 

## 2022-01-15 NOTE — Assessment & Plan Note (Signed)
Chronic Regular exercise and healthy diet encouraged Check lipid panel  Continue atorvastatin 10 mg daily, fenofibrate 145 mg daily

## 2022-01-15 NOTE — Assessment & Plan Note (Signed)
Chronic Following with cardiology On atenolol 25 mg daily Wearing compression socks, maintaining good water intake Stressed regular exercise

## 2022-01-15 NOTE — Assessment & Plan Note (Signed)
She states decreased urine output that not necessarily acute She does feel like she is drinking plenty of water Given recent back pain which does sound more musculoskeletal, and history of UTI will check UA, urine culture

## 2022-01-15 NOTE — Assessment & Plan Note (Signed)
Chronic On Remeron 45 mg nightly

## 2022-01-15 NOTE — Assessment & Plan Note (Signed)
Chronic Continue Lyrica 150 mg twice daily, tizanidine 4 mg 3 times daily as needed for baclofen 10 mg 3 times daily-does not take both at the same time Continue Cymbalta 90 mg daily

## 2022-01-15 NOTE — Assessment & Plan Note (Signed)
Chronic Check a1c Low sugar / carb diet Stressed regular exercise  

## 2022-01-15 NOTE — Assessment & Plan Note (Signed)
Chronic Taking iron daily Check iron levels, CBC, B12, folate

## 2022-01-15 NOTE — Assessment & Plan Note (Signed)
Started having mid back pain 4-5 days ago that was initially severe.  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

## 2022-01-15 NOTE — Assessment & Plan Note (Signed)
Chronic Blood pressure well controlled CMP Continue atenolol 25 mg daily

## 2022-01-17 LAB — URINE CULTURE

## 2022-01-21 ENCOUNTER — Encounter: Payer: Self-pay | Admitting: Internal Medicine

## 2022-01-22 ENCOUNTER — Other Ambulatory Visit: Payer: Self-pay | Admitting: Internal Medicine

## 2022-01-22 MED ORDER — METFORMIN HCL 500 MG PO TABS: 500.0000 mg | ORAL_TABLET | Freq: Two times a day (BID) | ORAL | 2 refills | Status: DC

## 2022-01-24 DIAGNOSIS — G43719 Chronic migraine without aura, intractable, without status migrainosus: Secondary | ICD-10-CM | POA: Diagnosis not present

## 2022-02-05 ENCOUNTER — Other Ambulatory Visit: Payer: Self-pay | Admitting: Internal Medicine

## 2022-02-06 ENCOUNTER — Other Ambulatory Visit: Payer: Self-pay

## 2022-02-06 ENCOUNTER — Other Ambulatory Visit: Payer: Self-pay | Admitting: Internal Medicine

## 2022-02-06 MED ORDER — FENOFIBRATE 145 MG PO TABS: ORAL_TABLET | ORAL | 2 refills | Status: DC

## 2022-02-12 ENCOUNTER — Other Ambulatory Visit: Payer: Self-pay | Admitting: Nurse Practitioner

## 2022-02-12 DIAGNOSIS — N390 Urinary tract infection, site not specified: Secondary | ICD-10-CM

## 2022-02-12 NOTE — Telephone Encounter (Signed)
Last annual exam 05/09/21

## 2022-02-27 ENCOUNTER — Encounter: Payer: Self-pay | Admitting: Internal Medicine

## 2022-02-27 NOTE — Progress Notes (Signed)
[facility] Quality Other; (KED MEASURE) CHART REVIEWED FOR KED (KIDNEY HEALTH) GAP- URINE MICROALBUMIN CREATININE NEEDED TO CLOSE GAP PLEASE ADDRESS BY 03/24/2022 IF POSSIBLE. THANKS.

## 2022-03-11 ENCOUNTER — Other Ambulatory Visit: Payer: Self-pay | Admitting: Internal Medicine

## 2022-04-04 DIAGNOSIS — G43719 Chronic migraine without aura, intractable, without status migrainosus: Secondary | ICD-10-CM | POA: Diagnosis not present

## 2022-04-08 ENCOUNTER — Other Ambulatory Visit: Payer: Self-pay | Admitting: Internal Medicine

## 2022-04-09 ENCOUNTER — Encounter: Payer: Self-pay | Admitting: Nurse Practitioner

## 2022-04-22 ENCOUNTER — Other Ambulatory Visit: Payer: Self-pay | Admitting: Internal Medicine

## 2022-05-09 ENCOUNTER — Other Ambulatory Visit: Payer: Self-pay | Admitting: Internal Medicine

## 2022-05-09 ENCOUNTER — Other Ambulatory Visit: Payer: Self-pay

## 2022-05-16 ENCOUNTER — Encounter: Payer: Self-pay | Admitting: Nurse Practitioner

## 2022-05-16 ENCOUNTER — Ambulatory Visit (INDEPENDENT_AMBULATORY_CARE_PROVIDER_SITE_OTHER): Payer: Medicare Other | Admitting: Nurse Practitioner

## 2022-05-16 ENCOUNTER — Other Ambulatory Visit (HOSPITAL_COMMUNITY)
Admission: RE | Admit: 2022-05-16 | Discharge: 2022-05-16 | Disposition: A | Discharge status: Home / Self Care | Payer: Medicare Other | Source: Ambulatory Visit | Attending: Nurse Practitioner | Admitting: Nurse Practitioner

## 2022-05-16 VITALS — BP 124/80 | Ht 65.0 in | Wt 195.0 lb

## 2022-05-16 DIAGNOSIS — N390 Urinary tract infection, site not specified: Secondary | ICD-10-CM | POA: Diagnosis not present

## 2022-05-16 DIAGNOSIS — Z01419 Encounter for gynecological examination (general) (routine) without abnormal findings: Secondary | ICD-10-CM

## 2022-05-16 DIAGNOSIS — Z9189 Other specified personal risk factors, not elsewhere classified: Secondary | ICD-10-CM

## 2022-05-16 DIAGNOSIS — B3731 Acute candidiasis of vulva and vagina: Secondary | ICD-10-CM | POA: Diagnosis not present

## 2022-05-16 DIAGNOSIS — R87612 Low grade squamous intraepithelial lesion on cytologic smear of cervix (LGSIL): Secondary | ICD-10-CM

## 2022-05-16 DIAGNOSIS — A609 Anogenital herpesviral infection, unspecified: Secondary | ICD-10-CM

## 2022-05-16 DIAGNOSIS — N898 Other specified noninflammatory disorders of vagina: Secondary | ICD-10-CM

## 2022-05-16 DIAGNOSIS — L989 Disorder of the skin and subcutaneous tissue, unspecified: Secondary | ICD-10-CM

## 2022-05-16 DIAGNOSIS — Z1151 Encounter for screening for human papillomavirus (HPV): Secondary | ICD-10-CM | POA: Insufficient documentation

## 2022-05-16 DIAGNOSIS — Z3041 Encounter for surveillance of contraceptive pills: Secondary | ICD-10-CM | POA: Diagnosis not present

## 2022-05-16 DIAGNOSIS — N760 Acute vaginitis: Secondary | ICD-10-CM

## 2022-05-16 DIAGNOSIS — B009 Herpesviral infection, unspecified: Secondary | ICD-10-CM | POA: Diagnosis not present

## 2022-05-16 LAB — WET PREP FOR TRICH, YEAST, CLUE

## 2022-05-16 MED ORDER — NORETHINDRONE ACET-ETHINYL EST 1-20 MG-MCG PO TABS: 1.0000 | ORAL_TABLET | Freq: Every day | ORAL | 3 refills | Status: DC

## 2022-05-16 MED ORDER — NITROFURANTOIN MONOHYD MACRO 100 MG PO CAPS: 100.0000 mg | ORAL_CAPSULE | ORAL | 1 refills | Status: DC | PRN

## 2022-05-16 MED ORDER — VALACYCLOVIR HCL 1 G PO TABS: 1000.0000 mg | ORAL_TABLET | Freq: Every day | ORAL | 3 refills | Status: DC

## 2022-05-16 MED ORDER — FLUCONAZOLE 150 MG PO TABS: 150.0000 mg | ORAL_TABLET | ORAL | 0 refills | Status: DC

## 2022-05-16 NOTE — Progress Notes (Signed)
Kayla Floyd 07/29/1980 [id number]   History:  42 y.o. G0 presents for breast and pelvic exam. COCs continuously. Had little girl by surrogacy last year.  Normal pap history. History of recurrent postcoital UTIs, takes macrodantin prophylactically with intercourse.  HLD, HTN, migraines, POTS managed by PCP. HSV, occasional outbreaks, takes Valtrex daily. Occasional smoker. Negative genetic testing. Would like a mole looked at one mons pubis. Saw derm in the past, has had multiple lesions removed.   Gynecologic History Patient's last menstrual period was 05/02/2021.   Contraception/Family planning: OCP (estrogen/progesterone) Sexually active: Yes  Health Maintenance Last Pap: 05/09/2021. Results were: LGSIL neg HPV Last mammogram: Never Last colonoscopy: Never Last Dexa: Not indicated  Past medical history, past surgical history, family history and social history were all reviewed and documented in the EPIC chart. Married. 7 mo daughter, Kayla Floyd. SAHM.   ROS:  A ROS was performed and pertinent positives and negatives are included.  Exam:  Vitals:   05/16/22 1506  BP: 124/80  Weight: 195 lb (88.5 kg)  Height: 5' 5"$  (1.651 m)    Body mass index is 32.45 kg/m.  General appearance:  Normal Thyroid:  Symmetrical, normal in size, without palpable masses or nodularity. Respiratory  Auscultation:  Clear without wheezing or rhonchi Cardiovascular  Auscultation:  Regular rate, without rubs, murmurs or gallops  Edema/varicosities:  Not grossly evident Abdominal  Soft,nontender, without masses, guarding or rebound.  Liver/spleen:  No organomegaly noted  Hernia:  None appreciated  Skin  Inspection:  Grossly normal Breasts: Examined lying and sitting.   Right: Without masses, retractions, nipple discharge or axillary adenopathy.   Left: Without masses, retractions, nipple discharge or axillary adenopathy. Genitourinary   Inguinal/mons:  Normal without inguinal  adenopathy  External genitalia:  Normal appearing vulva with no masses, tenderness. ~5 mm flat lesion, regular border, slight darker center and border  BUS/Urethra/Skene's glands:  Normal  Vagina:  Discharge present, mild erythema  Cervix:  Normal appearing without discharge or lesions  Uterus:  Normal in size, shape and contour.  Midline and mobile, nontender  Adnexa/parametria:     Rt: Normal in size, without masses or tenderness.   Lt: Normal in size, without masses or tenderness.  Anus and perineum: Normal  Patient informed chaperone available to be present for breast and pelvic exam. Patient has requested no chaperone to be present. Patient has been advised what will be completed during breast and pelvic exam.   Wet prep + yeast  Assessment/Plan:  42 y.o. G0 for breast and pelvic exam.   Well female exam with routine gynecological exam - Plan: Cytology - PAP( [facility]). Education provided on SBEs, importance of preventative screenings, current guidelines, high calcium diet, regular exercise, and multivitamin daily. Labs with PCP.   LGSIL on Pap smear of cervix - Plan: Cytology - PAP( [facility]). 04/2021 pap LGSIL. Pap today.   Vaginal discharge - Plan: [facility], CLUE. Wet prep positive for yeast.   Encounter for surveillance of contraceptive pills - Plan: norethindrone-ethinyl estradiol (JUNEL 1/20) 1-20 MG-MCG tablet daily. Takes continuously for menstrual suppression. Smokes cigarettes occasionally. Discussed risk of COCs and smoking. She reports it is rare and she wants to continue. Refill x 1 year provided.   HSV (herpes simplex virus) anogenital infection - Plan: valACYclovir (VALTREX) 1000 MG tablet daily. Occasional outbreaks.  Recurrent UTI - Plan: nitrofurantoin, macrocrystal-monohydrate, (MACROBID) 100 MG capsule after intercourse.  Vaginal candidiasis - Plan: fluconazole (DIFLUCAN) 150 MG tablet today  and repeat in 3 days for total of 2 doses.    Skin lesion - Flat, light brown lesion, darker center and slightly darker border, regular border. Patient unsure if it has changed. Appears benign but due to history it is recommended she  follow up with derm.   Screening for breast cancer - Has not had screening mammogram. Discussed current guidelines and importance of preventative screening. Information provided on local breast imaging centers. Normal breast exam today.  Return in 1 year for annual.     Kayla Gammon DNP, 3:45 PM 05/16/2022

## 2022-05-17 DIAGNOSIS — G43719 Chronic migraine without aura, intractable, without status migrainosus: Secondary | ICD-10-CM | POA: Diagnosis not present

## 2022-05-20 LAB — CYTOLOGY - PAP
Comment: NEGATIVE
Diagnosis: UNDETERMINED — AB
High risk HPV: NEGATIVE

## 2022-06-08 ENCOUNTER — Other Ambulatory Visit: Payer: Self-pay | Admitting: Internal Medicine

## 2022-07-03 ENCOUNTER — Ambulatory Visit: Payer: Medicare Other | Admitting: Allergy

## 2022-07-03 ENCOUNTER — Other Ambulatory Visit: Payer: Self-pay

## 2022-07-03 ENCOUNTER — Encounter: Payer: Self-pay | Admitting: Allergy

## 2022-07-03 VITALS — BP 110/78 | HR 101 | Temp 98.3°F | Resp 20 | Ht 65.0 in | Wt 193.3 lb

## 2022-07-03 DIAGNOSIS — R232 Flushing: Secondary | ICD-10-CM | POA: Diagnosis not present

## 2022-07-03 DIAGNOSIS — R1084 Generalized abdominal pain: Secondary | ICD-10-CM

## 2022-07-03 NOTE — Patient Instructions (Addendum)
Flushing Abdominal pain -There is concern for possible mast cell activation issue which is a spectrum.  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 -Tryptase ordered for current baseline -Future tryptase lab has been ordered and she has been instructed when she is having an acute episode of flushing to have her tryptase level done within a 4 to 6-hour window of symptom onset to see if the tryptase level is elevated during a flare. -She has access to an epinephrine device to use in case of allergic reaction -Recommend that use of H1 blocker with H2 blocker at this time.   For H1 blocker I recommended she take a long-acting antihistamine like Xyzal, Allegra or Zyrtec daily. For H1 blocker she also is currently taking hydroxyzine as needed.   For H2 blocker I have recommended that she take Pepcid 20 mg 1 tab daily at this time -Consider adding cromolyn which is a mast cell stabilizer if needed -Consider adding Singulair which is an antileukotriene if needed

## 2022-07-03 NOTE — Progress Notes (Signed)
Follow-up Note  RE: MARIETHERESE Floyd MRN: [id number] DOB: 1980/03/28 Date of Office Visit: 07/03/2022   History of present illness: Kayla Floyd is a 42 y.o. female presenting today for concern for Mast cell activation.  9 she was last seen in the office on 04/20/2021 by our nurse practitioner Ambs at which time she performed a beef challenge that she did pass.  She has been eating beef and pork in diet now without issue but does not eat very often.  She states sometimes she does note tingling of the mouth and will take a benadryl. She has access to epinephrine device.   She states few months ago she has been having full body reactions where she turns red, hot to touch that starts on her ears and progresses down the body.  She states feels like her whole body is a welt and feels like a sunburn.   Nother has changed in her diet or lifestyle.  She does note stressful situations seems to flare it.  She states she has walked into [employer] and had the flushing.   She states the flushing can happen weekly.   She usually will take benadryl.  She has been prescribed hydroxyzine for anxiety and has taken with benadryl.   She has had some occasional abd cramping without any triggers or identifiable reasons to have abdominal pain.  She does have history of gastroparesis but states has not had formal studies.  She has history of EDS and dysautonomia and it was thought that she likely had gastroparesis because of these other issues. She reports she has chronic headaches and is on botox for management.  She states she was tested for the MTHFR gene and does have this deficiency which has been linked to other issues like EDS and dysautonomia as well as autism.  However there is also a concern that it may be linked with mast cell activation issues.  Review of systems: Review of Systems  Constitutional: Negative.   Eyes: Negative.   Respiratory: Negative.    Cardiovascular: Negative.   Gastrointestinal:         See HPI  Musculoskeletal: Negative.   Skin:        See HPI  Allergic/Immunologic: Negative.   Neurological: Negative.      All other systems negative unless noted above in HPI  Past medical/social/surgical/family history have been reviewed and are unchanged unless specifically indicated below.  No changes  Medication List: Current Outpatient Medications  Medication Sig Dispense Refill   acetaminophen (TYLENOL) 325 MG tablet Take 2 tablets (650 mg total) by mouth every 6 (six) hours as needed (pain).     atenolol (TENORMIN) 25 MG tablet TAKE 1 TABLET BY MOUTH EVERY DAY 30 tablet 2   atomoxetine (STRATTERA) 40 MG capsule Take 40 mg by mouth daily.     atorvastatin (LIPITOR) 10 MG tablet TAKE 1 TABLET BY MOUTH EVERY DAY 30 tablet 5   AUVELITY 45-105 MG TBCR PLEASE SEE ATTACHED FOR DETAILED DIRECTIONS     b complex vitamins tablet Take 1 tablet by mouth daily.     baclofen (LIORESAL) 10 MG tablet TAKE 1 TABLET BY MOUTH THREE TIMES A DAY AS NEEDED FOR MUSCLE SPASM 90 tablet 2   BOTOX 200 units SOLR      botulinum toxin Type A (BOTOX) 100 units SOLR injection Inject 100 Units into the muscle every 3 (three) months. Every 12 weeks at [facility] 2 vial    buPROPion [facility]  XL) 150 MG 24 hr tablet Take 150 mg by mouth every morning.     cetirizine (ZYRTEC) 10 MG tablet Take 10 mg by mouth daily. Reported on 07/13/2015     EPINEPHrine 0.3 mg/0.3 mL IJ SOAJ injection Inject 0.3 mg into the muscle as needed for anaphylaxis. 1 each 5   fenofibrate (TRICOR) 145 MG tablet TAKE 1 TABLET (145MG  TOTAL) BY MOUTH EVERY DAY. PLEASE SCHEDULE FOLLOW UP FOR FUTURE REFILLS 30 tablet 2   hydrOXYzine (ATARAX) 50 MG tablet Take 50-100 mg by mouth 3 (three) times daily as needed.     hyoscyamine (LEVSIN SL) 0.125 MG SL tablet PLACE 1 TABLET UNDER THE TONGUE EVERY 6 (SIX) HOURS AS NEEDED. 120 tablet 1   lamoTRIgine (LAMICTAL) 25 MG tablet PLEASE SEE ATTACHED FOR DETAILED DIRECTIONS     metFORMIN  (GLUCOPHAGE) 500 MG tablet Take 1 tablet (500 mg total) by mouth 2 (two) times daily with a meal. 180 tablet 2   Multiple Vitamin (MULTIVITAMIN) tablet Take 1 tablet by mouth daily.       nitrofurantoin, macrocrystal-monohydrate, (MACROBID) 100 MG capsule Take 1 capsule (100 mg total) by mouth as needed. Take 1 capsule as needed with intercourse 30 capsule 1   norethindrone-ethinyl estradiol (JUNEL 1/20) 1-20 MG-MCG tablet Take 1 tablet by mouth daily. Take ACTIVE pills only, skipping PLACEBOS 112 tablet 3   omeprazole (PRILOSEC) 40 MG capsule TAKE 1 CAPSULE BY MOUTH EVERY DAY 30 capsule 5   ondansetron (ZOFRAN) 8 MG tablet Take 1 tablet (8 mg total) by mouth 2 (two) times daily as needed for nausea or vomiting. 20 tablet 0   pregabalin (LYRICA) 150 MG capsule TAKE 1 CAPSULE BY MOUTH TWICE A DAY 180 capsule 0   Probiotic Product (PROBIOTIC-10 PO) Take 1 capsule by mouth daily.     promethazine (PHENERGAN) 12.5 MG tablet Take by mouth daily as needed.     SUMAtriptan (IMITREX) 25 MG tablet Take 25 mg by mouth every 2 (two) hours as needed for migraine. May repeat in 2 hours if headache persists or recurs.     tiZANidine (ZANAFLEX) 4 MG tablet TAKE 1 TABLET BY MOUTH EVERY 8 HOURS AS NEEDED FOR MUSCLE SPASMS 90 tablet 1   valACYclovir (VALTREX) 1000 MG tablet Take 1 tablet (1,000 mg total) by mouth daily. 90 tablet 3   No current facility-administered medications for this visit.     Known medication allergies: Allergies  Allergen Reactions   Alpha-Gal Anaphylaxis   Beef-Derived Products     anaphylaxes    Glycerol, Iodinated Anaphylaxis    anaphylaxes    Milk-Related Compounds Anaphylaxis    Cheese, whey protein, gelatin   Pork-Derived Products     anaplaxis     Physical examination: Blood pressure 110/78, pulse (!) 101, temperature 98.3 F (36.8 C), resp. rate 20, height 5\' 5"  (1.651 m), weight 193 lb 4.8 oz (87.7 kg), SpO2 99 %.  General: Alert, interactive, in no acute  distress. HEENT: PERRLA, TMs pearly gray, turbinates non-edematous without discharge, post-pharynx non erythematous. Neck: Supple without lymphadenopathy. Lungs: Clear to auscultation without wheezing, rhonchi or rales. {no increased work of breathing. CV: Normal S1, S2 without murmurs. Abdomen: Nondistended, nontender. Skin: Warm and dry, without lesions or rashes. Extremities:  No clubbing, cyanosis or edema. Neuro:   Grossly intact.  Diagnositics/Labs: None today   Assessment and plan:   Flushing Abdominal pain -There is concern for possible mast cell activation issue which is a spectrum.  That she states she  has gene for MTHFR which is also associated with EDS and dysautonomia which she also states she has. -Tryptase ordered for current baseline -Future tryptase lab has been ordered and she has been instructed when she is having an acute episode of flushing to have her tryptase level done within a 4 to 6-hour window of symptom onset to see if the tryptase level is elevated during a flare.  If tryptase is elevated at baseline then it will be repeated to see if she has persistent elevation of tryptase.  If she does have a future flare and has a tryptase done if it is also elevated during these times then we may need to proceed to further testing which would include urinary histamine studies as well as a c-kit mutation test via blood work.  At that time we usually also refer to hematology for further workup of mast cell activation/mastocytosis. -She has access to an epinephrine device to use in case of allergic reaction -Recommend that use of H1 blocker with H2 blocker at this time.   For H1 blocker I recommended she take a long-acting antihistamine like Xyzal, Allegra or Zyrtec daily. For H1 blocker she also is currently taking hydroxyzine as needed.   For H2 blocker I have recommended that she take Pepcid 20 mg 1 tab daily at this time -Consider adding cromolyn which is a mast cell  stabilizer if needed -Consider adding Singulair which is an antileukotriene if needed  Follow-up in 2 to 3 months or sooner if needed I appreciate the opportunity to take part in Olevia's care. Please do not hesitate to contact me with questions.  Sincerely,   Margo AyeShaylar [place], MD Allergy/Immunology [facility]

## 2022-07-05 LAB — TRYPTASE: Tryptase: 7.6 ug/L (ref 2.2–13.2)

## 2022-07-08 ENCOUNTER — Ambulatory Visit (INDEPENDENT_AMBULATORY_CARE_PROVIDER_SITE_OTHER): Payer: Medicare Other

## 2022-07-08 VITALS — Ht 65.0 in | Wt 190.0 lb

## 2022-07-08 DIAGNOSIS — Z0000 Encounter for general adult medical examination without abnormal findings: Secondary | ICD-10-CM

## 2022-07-08 DIAGNOSIS — Z1231 Encounter for screening mammogram for malignant neoplasm of breast: Secondary | ICD-10-CM | POA: Diagnosis not present

## 2022-07-08 NOTE — Progress Notes (Signed)
Subjective:   Kayla Floyd is a 42 y.o. female who presents for Medicare Annual (Subsequent) preventive examination. I connected with  [name] on 07/08/22 by a audio enabled telemedicine application and verified that I am speaking with the correct person using two identifiers.  Patient Location: Home  Provider Location: Home Office  I discussed the limitations of evaluation and management by telemedicine. The patient expressed understanding and agreed to proceed.  Review of Systems     Cardiac Risk Factors include: advanced age (>2men, >64 women)     Objective:    Today's Vitals   07/08/22 1505  Weight: 190 lb (86.2 kg)  Height: 5\' 5"  (1.651 m)   Body mass index is 31.62 kg/m.     07/08/2022    3:08 PM 07/02/2021    3:35 PM 06/15/2021    1:06 PM 10/18/2020    9:40 PM 10/17/2020    2:46 PM 06/21/2020    3:21 PM 01/12/2019   10:48 AM  Advanced Directives  Does Patient Have a Medical Advance Directive? Yes No No No No No No  Type of Estate agent of [place];Living will        Copy of Healthcare Power of Attorney in Chart? No - copy requested        Would patient like information on creating a medical advance directive?  No - Patient declined No - Patient declined No - Patient declined  No - Patient declined No - Patient declined    Current Medications (verified) Outpatient Encounter Medications as of 07/08/2022  Medication Sig   acetaminophen (TYLENOL) 325 MG tablet Take 2 tablets (650 mg total) by mouth every 6 (six) hours as needed (pain).   atenolol (TENORMIN) 25 MG tablet TAKE 1 TABLET BY MOUTH EVERY DAY   atomoxetine (STRATTERA) 40 MG capsule Take 40 mg by mouth daily.   atorvastatin (LIPITOR) 10 MG tablet TAKE 1 TABLET BY MOUTH EVERY DAY   AUVELITY 45-105 MG TBCR PLEASE SEE ATTACHED FOR DETAILED DIRECTIONS   b complex vitamins tablet Take 1 tablet by mouth daily.   baclofen (LIORESAL) 10 MG tablet TAKE 1 TABLET BY MOUTH THREE TIMES  A DAY AS NEEDED FOR MUSCLE SPASM   BOTOX 200 units SOLR    botulinum toxin Type A (BOTOX) 100 units SOLR injection Inject 100 Units into the muscle every 3 (three) months. Every 12 weeks at [facility]   buPROPion (WELLBUTRIN XL) 150 MG 24 hr tablet Take 150 mg by mouth every morning.   cetirizine (ZYRTEC) 10 MG tablet Take 10 mg by mouth daily. Reported on 07/13/2015   EPINEPHrine 0.3 mg/0.3 mL IJ SOAJ injection Inject 0.3 mg into the muscle as needed for anaphylaxis.   fenofibrate (TRICOR) 145 MG tablet TAKE 1 TABLET (145MG  TOTAL) BY MOUTH EVERY DAY. PLEASE SCHEDULE FOLLOW UP FOR FUTURE REFILLS   hydrOXYzine (ATARAX) 50 MG tablet Take 50-100 mg by mouth 3 (three) times daily as needed.   hyoscyamine (LEVSIN SL) 0.125 MG SL tablet PLACE 1 TABLET UNDER THE TONGUE EVERY 6 (SIX) HOURS AS NEEDED.   lamoTRIgine (LAMICTAL) 25 MG tablet PLEASE SEE ATTACHED FOR DETAILED DIRECTIONS   metFORMIN (GLUCOPHAGE) 500 MG tablet Take 1 tablet (500 mg total) by mouth 2 (two) times daily with a meal.   Multiple Vitamin (MULTIVITAMIN) tablet Take 1 tablet by mouth daily.     nitrofurantoin, macrocrystal-monohydrate, (MACROBID) 100 MG capsule Take 1 capsule (100 mg total) by mouth as needed. Take 1  capsule as needed with intercourse   norethindrone-ethinyl estradiol (JUNEL 1/20) 1-20 MG-MCG tablet Take 1 tablet by mouth daily. Take ACTIVE pills only, skipping PLACEBOS   omeprazole (PRILOSEC) 40 MG capsule TAKE 1 CAPSULE BY MOUTH EVERY DAY   ondansetron (ZOFRAN) 8 MG tablet Take 1 tablet (8 mg total) by mouth 2 (two) times daily as needed for nausea or vomiting.   pregabalin (LYRICA) 150 MG capsule TAKE 1 CAPSULE BY MOUTH TWICE A DAY   Probiotic Product (PROBIOTIC-10 PO) Take 1 capsule by mouth daily.   promethazine (PHENERGAN) 12.5 MG tablet Take by mouth daily as needed.   SUMAtriptan (IMITREX) 25 MG tablet Take 25 mg by mouth every 2 (two) hours as needed for migraine. May repeat in 2 hours if headache persists  or recurs.   tiZANidine (ZANAFLEX) 4 MG tablet TAKE 1 TABLET BY MOUTH EVERY 8 HOURS AS NEEDED FOR MUSCLE SPASMS   valACYclovir (VALTREX) 1000 MG tablet Take 1 tablet (1,000 mg total) by mouth daily.   No facility-administered encounter medications on file as of 07/08/2022.    Allergies (verified) Alpha-gal; Beef-derived products; Glycerol, iodinated; Milk-related compounds; and Pork-derived products   History: Past Medical History:  Diagnosis Date   ANXIETY    BACK PAIN, CHRONIC    DYSAUTONOMIA 2011   "POTS" , recurrent syncope - follows with cards for same   GERD    HSV (herpes simplex virus) anogenital infection 06/2015   cervix swab   HYPERLIPIDEMIA    Kidney failure    MIGRAINE HEADACHE    Palpitations    POLYCYSTIC OVARIAN DISEASE    POTS (postural orthostatic tachycardia syndrome)    Recurrent upper respiratory infection (URI)    Tear meniscus knee 06/14/2010   right   Past Surgical History:  Procedure Laterality Date   Cervical spine  2010   ACDF c5-6   crevical fusion   2010   evs     LUMBAR LAMINECTOMY  2008   TONSILLECTOMY AND ADENOIDECTOMY     Family History  Problem Relation Age of Onset   Bipolar disorder Mother    Stroke Mother        Age of 37 while on OCP   Other Father        MVA   Heart disease Father    Heart attack Father    Colon cancer Maternal Grandmother 31   Cancer Maternal Grandmother    Hypertension Sister    Hypertension Sister    Migraines Sister    Breast cancer Paternal Grandmother        dx in her 13s-40s   Cancer Paternal Uncle        ocular   Breast cancer Paternal Aunt 24   Lung cancer Paternal Uncle    Social History   Socioeconomic History   Marital status: Married    Spouse name: Not on file   Number of children: Not on file   Years of education: Not on file   Highest education level: Not on file  Occupational History   Occupation: part-time Chiopractor  Tobacco Use   Smoking status: Every Day    Packs/day:  0.10    Years: 10.00    Additional pack years: 0.00    Total pack years: 1.00    Types: Cigarettes   Smokeless tobacco: Never   Tobacco comments:    3-4 cigarettes per day  Vaping Use   Vaping Use: Never used  Substance and Sexual Activity   Alcohol use: Not Currently  Drug use: No   Sexual activity: Yes    Partners: Male    Comment: 1st intercourse 42 yo-More than 5 partners, married- 1 yr  Other Topics Concern   Not on file  Social History Narrative   Disability since 10/2012 due to POTS, 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[employer] [facility]      Some exercise on a regular exericse   Social Determinants of Health   Financial Resource Strain: Low Risk  (07/08/2022)   Overall Financial Resource Strain (CARDIA)    Difficulty of Paying Living Expenses: Not hard at all  Food Insecurity: No Food Insecurity (07/08/2022)   Hunger Vital Sign    Worried About Running Out of Food in the Last Year: Never true    Ran Out of Food in the Last Year: Never true  Transportation Needs: No Transportation Needs (07/08/2022)   PRAPARE - Administrator, Civil Service (Medical): No    Lack of Transportation (Non-Medical): No  Physical Activity: Insufficiently Active (07/08/2022)   Exercise Vital Sign    Days of Exercise per Week: 3 days    Minutes of Exercise per Session: 30 min  Stress: No Stress Concern Present (07/08/2022)   [employer] of Occupational Health - Occupational Stress Questionnaire    Feeling of Stress : Not at all  Social Connections: Moderately Integrated (07/08/2022)   Social Connection and Isolation Panel [NHANES]    Frequency of Communication with Friends and Family: More than three times a week    Frequency of Social Gatherings with Friends and Family: More than three times a week    Attends Religious Services: Never    Database administrator or Organizations: Yes    Attends Engineer, structural: More than 4 times per year    Marital Status: Married    Tobacco  Counseling Ready to quit: No Counseling given: Not Answered Tobacco comments: 3-4 cigarettes per day   Clinical Intake:  Pre-visit preparation completed: Yes  Pain : No/denies pain     Nutritional Risks: None Diabetes: No  How often do you need to have someone help you when you read instructions, pamphlets, or other written materials from your doctor or pharmacy?: 1 - Never  Diabetic?no   Interpreter Needed?: No  Information entered by :: Renie Ora, LPN   Activities of Daily Living    07/08/2022    3:09 PM 07/04/2022    8:37 AM  In your present state of health, do you have any difficulty performing the following activities:  Hearing? 0 0  Vision? 0 0  Difficulty concentrating or making decisions? 0 1  Walking or climbing stairs? 0 1  Dressing or bathing? 0 1  Doing errands, shopping? 0 1  Preparing Food and eating ? N Y  Using the Toilet? N N  In the past six months, have you accidently leaked urine? N N  Do you have problems with loss of bowel control? N N  Managing your Medications? N Y  Managing your Finances? N Y  Housekeeping or managing your Housekeeping? N Y    Patient Care Team: Pincus Sanes, MD as PCP - General (Internal Medicine) Rollene Rotunda, MD (Cardiology) Teryl Lucy, MD (Orthopedic Surgery) Irene Limbo, FNP (Nurse Practitioner) Kathyrn Sheriff, [facility] as Pharmacist (Pharmacist)  Indicate any recent Medical Services you may have received from other than [facility] providers in the past year (date may be approximate).     Assessment:   This is a routine wellness examination  for Kayla Floyd.  Hearing/Vision screen Vision Screening - Comments:: Wears rx glasses - up to date with routine eye exams with  [facility]   Dietary issues and exercise activities discussed: Current Exercise Habits: Home exercise routine, Type of exercise: walking, Time (Minutes): 30, Frequency (Times/Week): 3, Weekly Exercise (Minutes/Week): 90,  Intensity: Mild, Exercise limited by: None identified   Goals Addressed             This Visit's Progress    DIET - INCREASE WATER INTAKE         Depression Screen    07/08/2022    3:07 PM 07/02/2021    3:43 PM 06/21/2020    3:20 PM 01/12/2019   10:37 AM 06/20/2014   10:41 AM  PHQ 2/9 Scores  PHQ - 2 Score 0 0 0 1 0    Fall Risk    07/08/2022    3:05 PM 07/04/2022    8:37 AM 07/02/2021    3:40 PM 06/21/2020    3:21 PM 01/12/2019   10:37 AM  Fall Risk   Falls in the past year? 0 0 0 0 0  Number falls in past yr: 0 0 0 0 0  Injury with Fall? 0 0 0 0 0  Risk for fall due to : No Fall Risks  No Fall Risks No Fall Risks   Follow up Falls prevention discussed  Falls evaluation completed Falls evaluation completed     FALL RISK PREVENTION PERTAINING TO THE HOME:  Any stairs in or around the home? Yes  If so, are there any without handrails? No  Home free of loose throw rugs in walkways, pet beds, electrical cords, etc? Yes  Adequate lighting in your home to reduce risk of falls? Yes   ASSISTIVE DEVICES UTILIZED TO PREVENT FALLS:  Life alert? No  Use of a cane, walker or w/c? No  Grab bars in the bathroom? No  Shower chair or bench in shower? No  Elevated toilet seat or a handicapped toilet? No          07/08/2022    3:09 PM 07/02/2021    3:46 PM  6CIT Screen  What Year? 0 points 0 points  What month? 0 points 0 points  What time? 0 points 0 points  Count back from 20 0 points 0 points  Months in reverse 0 points 0 points  Repeat phrase 0 points 0 points  Total Score 0 points 0 points    Immunizations Immunization History  Administered Date(s) Administered   DTP 03/31/1980, 05/02/1980, 06/07/1980, 07/05/1981, 11/09/1984   Influenza Whole 12/23/2008   Influenza,inj,Quad PF,6+ Mos 12/23/2017   MMR 04/25/1981, 08/26/2003   Moderna Sars-Covid-2 Vaccination 07/06/2019, 08/03/2019   OPV 03/31/1980, 06/07/1980, 08/09/1980, 07/05/1981, 11/09/1984   Pfizer  Covid-19 Vaccine Bivalent Booster 48yrs & up 09/08/2021   Td 11/11/1994, 03/25/2008   Tdap 12/23/2017, 09/08/2021    TDAP status: Up to date  Flu Vaccine status: Declined, Education has been provided regarding the importance of this vaccine but patient still declined. Advised may receive this vaccine at local pharmacy or Health Dept. Aware to provide a copy of the vaccination record if obtained from local pharmacy or Health Dept. Verbalized acceptance and understanding.  Pneumococcal vaccine status: Due, Education has been provided regarding the importance of this vaccine. Advised may receive this vaccine at local pharmacy or Health Dept. Aware to provide a copy of the vaccination record if obtained from local pharmacy or Health Dept. Verbalized acceptance and understanding.  Covid-19 vaccine status: Completed vaccines  Qualifies for Shingles Vaccine? Yes   Zostavax completed No   Shingrix Completed?: No.    Education has been provided regarding the importance of this vaccine. Patient has been advised to call insurance company to determine out of pocket expense if they have not yet received this vaccine. Advised may also receive vaccine at local pharmacy or Health Dept. Verbalized acceptance and understanding.  Screening Tests Health Maintenance  Topic Date Due   COVID-19 Vaccine (4 - 2023-24 season) 11/23/2021   INFLUENZA VACCINE  10/24/2022   Medicare Annual Wellness (AWV)  07/08/2023   PAP SMEAR-Modifier  05/16/2025   DTaP/Tdap/Td (10 - Td or Tdap) 09/09/2031   Hepatitis C Screening  Completed   HIV Screening  Completed   HPV VACCINES  Aged Out    Health Maintenance  Health Maintenance Due  Topic Date Due   COVID-19 Vaccine (4 - 2023-24 season) 11/23/2021    Colorectal cancer screening: Referral to GI placed not of age . Pt aware the office will call re: appt.  Mammogram status: Ordered referral 07/08/2022. Pt provided with contact info and advised to call to schedule appt.    Bone Density status: Ordered not of age . Pt provided with contact info and advised to call to schedule appt.  Lung Cancer Screening: (Low Dose CT Chest recommended if Age 75-80 years, 30 pack-year currently smoking OR have quit w/in 15years.) does qualify.   Lung Cancer Screening Referral: Will discuss with PCP   Additional Screening:  Hepatitis C Screening: does not qualify;   Vision Screening: Recommended annual ophthalmology exams for early detection of glaucoma and other disorders of the eye. Is the patient up to date with their annual eye exam?  No  Who is the provider or what is the name of the office in which the patient attends annual eye exams? None referral 07/08/2022 If pt is not established with a provider, would they like to be referred to a provider to establish care? No .   Dental Screening: Recommended annual dental exams for proper oral hygiene  Community Resource Referral / Chronic Care Management: CRR required this visit?  No   CCM required this visit?  No      Plan:     I have personally reviewed and noted the following in the patient's chart:   Medical and social history Use of alcohol, tobacco or illicit drugs  Current medications and supplements including opioid prescriptions. Patient is not currently taking opioid prescriptions. Functional ability and status Nutritional status Physical activity Advanced directives List of other physicians Hospitalizations, surgeries, and ER visits in previous 12 months Vitals Screenings to include cognitive, depression, and falls Referrals and appointments  In addition, I have reviewed and discussed with patient certain preventive protocols, quality metrics, and best practice recommendations. A written personalized care plan for preventive services as well as general preventive health recommendations were provided to patient.     Lorrene Reid, LPN   [id number]/61/0960   Nurse Notes: none

## 2022-07-08 NOTE — Patient Instructions (Signed)
Kayla Floyd , Thank you for taking time to come for your Medicare Wellness Visit. I appreciate your ongoing commitment to your health goals. Please review the following plan we discussed and let me know if I can assist you in the future.   These are the goals we discussed:  Goals      DIET - INCREASE WATER INTAKE     Manage My Medicine     Timeframe:  Long-Range Goal Priority:  High Start Date:     12/29/20                        Expected End Date:      12/29/21                 Follow Up Date Sept 2023   - call for medicine refill 2 or 3 days before it runs out - call if I am sick and can't take my medicine - keep a list of all the medicines I take; vitamins and herbals too  -make appt with cardiology to discuss HR/BP issues   Why is this important?   These steps will help you keep on track with your medicines.   Notes:      Patient Stated     I want to work       Patient Stated     I want a better quality of living and to be more active.     To be able to take care of the new baby in July        This is a list of the screening recommended for you and due dates:  Health Maintenance  Topic Date Due   COVID-19 Vaccine (4 - 2023-24 season) 11/23/2021   Flu Shot  10/24/2022   Medicare Annual Wellness Visit  07/08/2023   Pap Smear  05/16/2025   DTaP/Tdap/Td vaccine (10 - Td or Tdap) 09/09/2031   Hepatitis C Screening: USPSTF Recommendation to screen - Ages 54-79 yo.  Completed   HIV Screening  Completed   HPV Vaccine  Aged Out    Advanced directives: Advance directive discussed with you today. I have provided a copy for you to complete at home and have notarized. Once this is complete please bring a copy in to our office so we can scan it into your chart.   Conditions/risks identified: Aim for 30 minutes of exercise or brisk walking, 6-8 glasses of water, and 5 servings of fruits and vegetables each day.   Next appointment: Follow up in one year for your annual wellness  visit.   Preventive Care 40-64 Years, Female Preventive care refers to lifestyle choices and visits with your health care provider that can promote health and wellness. What does preventive care include? A yearly physical exam. This is also called an annual well check. Dental exams once or twice a year. Routine eye exams. Ask your health care provider how often you should have your eyes checked. Personal lifestyle choices, including: Daily care of your teeth and gums. Regular physical activity. Eating a healthy diet. Avoiding tobacco and drug use. Limiting alcohol use. Practicing safe sex. Taking low-dose aspirin daily starting at age 79. Taking vitamin and mineral supplements as recommended by your health care provider. What happens during an annual well check? The services and screenings done by your health care provider during your annual well check will depend on your age, overall health, lifestyle risk factors, and family history of disease. Counseling  Your health care provider may ask you questions about your: Alcohol use. Tobacco use. Drug use. Emotional well-being. Home and relationship well-being. Sexual activity. Eating habits. Work and work Astronomer. Method of birth control. Menstrual cycle. Pregnancy history. Screening  You may have the following tests or measurements: Height, weight, and BMI. Blood pressure. Lipid and cholesterol levels. These may be checked every 5 years, or more frequently if you are over 26 years old. Skin check. Lung cancer screening. You may have this screening every year starting at age 64 if you have a 30-pack-year history of smoking and currently smoke or have quit within the past 15 years. Fecal occult blood test (FOBT) of the stool. You may have this test every year starting at age 31. Flexible sigmoidoscopy or colonoscopy. You may have a sigmoidoscopy every 5 years or a colonoscopy every 10 years starting at age 90. Hepatitis C  blood test. Hepatitis B blood test. Sexually transmitted disease (STD) testing. Diabetes screening. This is done by checking your blood sugar (glucose) after you have not eaten for a while (fasting). You may have this done every 1-3 years. Mammogram. This may be done every 1-2 years. Talk to your health care provider about when you should start having regular mammograms. This may depend on whether you have a family history of breast cancer. BRCA-related cancer screening. This may be done if you have a family history of breast, ovarian, tubal, or peritoneal cancers. Pelvic exam and Pap test. This may be done every 3 years starting at age 28. Starting at age 64, this may be done every 5 years if you have a Pap test in combination with an HPV test. Bone density scan. This is done to screen for osteoporosis. You may have this scan if you are at high risk for osteoporosis. Discuss your test results, treatment options, and if necessary, the need for more tests with your health care provider. Vaccines  Your health care provider may recommend certain vaccines, such as: Influenza vaccine. This is recommended every year. Tetanus, diphtheria, and acellular pertussis (Tdap, Td) vaccine. You may need a Td booster every 10 years. Zoster vaccine. You may need this after age 18. Pneumococcal 13-valent conjugate (PCV13) vaccine. You may need this if you have certain conditions and were not previously vaccinated. Pneumococcal polysaccharide (PPSV23) vaccine. You may need one or two doses if you smoke cigarettes or if you have certain conditions. Talk to your health care provider about which screenings and vaccines you need and how often you need them. This information is not intended to replace advice given to you by your health care provider. Make sure you discuss any questions you have with your health care provider. Document Released: 04/07/2015 Document Revised: 11/29/2015 Document Reviewed: 01/10/2015 Elsevier  Interactive Patient Education  2017 [employer].    Fall Prevention in the Home Falls can cause injuries. They can happen to people of all ages. There are many things you can do to make your home safe and to help prevent falls. What can I do on the outside of my home? Regularly fix the edges of walkways and driveways and fix any cracks. Remove anything that might make you trip as you walk through a door, such as a raised step or threshold. Trim any bushes or trees on the path to your home. Use bright outdoor lighting. Clear any walking paths of anything that might make someone trip, such as rocks or tools. Regularly check to see if handrails are loose or broken.  Make sure that both sides of any steps have handrails. Any raised decks and porches should have guardrails on the edges. Have any leaves, snow, or ice cleared regularly. Use sand or salt on walking paths during winter. Clean up any spills in your garage right away. This includes oil or grease spills. What can I do in the bathroom? Use night lights. Install grab bars by the toilet and in the tub and shower. Do not use towel bars as grab bars. Use non-skid mats or decals in the tub or shower. If you need to sit down in the shower, use a plastic, non-slip stool. Keep the floor dry. Clean up any water that spills on the floor as soon as it happens. Remove soap buildup in the tub or shower regularly. Attach bath mats securely with double-sided non-slip rug tape. Do not have throw rugs and other things on the floor that can make you trip. What can I do in the bedroom? Use night lights. Make sure that you have a light by your bed that is easy to reach. Do not use any sheets or blankets that are too big for your bed. They should not hang down onto the floor. Have a firm chair that has side arms. You can use this for support while you get dressed. Do not have throw rugs and other things on the floor that can make you trip. What can I  do in the kitchen? Clean up any spills right away. Avoid walking on wet floors. Keep items that you use a lot in easy-to-reach places. If you need to reach something above you, use a strong step stool that has a grab bar. Keep electrical cords out of the way. Do not use floor polish or wax that makes floors slippery. If you must use wax, use non-skid floor wax. Do not have throw rugs and other things on the floor that can make you trip. What can I do with my stairs? Do not leave any items on the stairs. Make sure that there are handrails on both sides of the stairs and use them. Fix handrails that are broken or loose. Make sure that handrails are as long as the stairways. Check any carpeting to make sure that it is firmly attached to the stairs. Fix any carpet that is loose or worn. Avoid having throw rugs at the top or bottom of the stairs. If you do have throw rugs, attach them to the floor with carpet tape. Make sure that you have a light switch at the top of the stairs and the bottom of the stairs. If you do not have them, ask someone to add them for you. What else can I do to help prevent falls? Wear shoes that: Do not have high heels. Have rubber bottoms. Are comfortable and fit you well. Are closed at the toe. Do not wear sandals. If you use a stepladder: Make sure that it is fully opened. Do not climb a closed stepladder. Make sure that both sides of the stepladder are locked into place. Ask someone to hold it for you, if possible. Clearly mark and make sure that you can see: Any grab bars or handrails. First and last steps. Where the edge of each step is. Use tools that help you move around (mobility aids) if they are needed. These include: Canes. Walkers. Scooters. Crutches. Turn on the lights when you go into a dark area. Replace any light bulbs as soon as they burn out. Set up your  furniture so you have a clear path. Avoid moving your furniture around. If any of your  floors are uneven, fix them. If there are any pets around you, be aware of where they are. Review your medicines with your doctor. Some medicines can make you feel dizzy. This can increase your chance of falling. Ask your doctor what other things that you can do to help prevent falls. This information is not intended to replace advice given to you by your health care provider. Make sure you discuss any questions you have with your health care provider. Document Released: 01/05/2009 Document Revised: 08/17/2015 Document Reviewed: 04/15/2014 Elsevier Interactive Patient Education  2017 [employer].

## 2022-07-15 NOTE — Patient Instructions (Addendum)
      Blood work was ordered.   The lab is on the first floor.    Medications changes include :       A referral was ordered for XXX.     Someone will call you to schedule an appointment.    Return in about 6 months (around 01/15/2023) for Physical Exam.

## 2022-07-15 NOTE — Progress Notes (Unsigned)
Subjective:    Patient ID: Kayla Floyd, female    DOB: [birth year], 42 y.o.   MRN: [id number]     HPI [place] is here for follow up of her chronic medical problems.  No major concerns.   No regular exercise.    Medications and allergies reviewed with patient and updated if appropriate.  Current Outpatient Medications on File Prior to Visit  Medication Sig Dispense Refill   acetaminophen (TYLENOL) 325 MG tablet Take 2 tablets (650 mg total) by mouth every 6 (six) hours as needed (pain).     atomoxetine (STRATTERA) 40 MG capsule Take 40 mg by mouth daily.     atorvastatin (LIPITOR) 10 MG tablet TAKE 1 TABLET BY MOUTH EVERY DAY 30 tablet 5   AUVELITY 45-105 MG TBCR PLEASE SEE ATTACHED FOR DETAILED DIRECTIONS     b complex vitamins tablet Take 1 tablet by mouth daily.     baclofen (LIORESAL) 10 MG tablet TAKE 1 TABLET BY MOUTH THREE TIMES A DAY AS NEEDED FOR MUSCLE SPASM 90 tablet 2   BOTOX 200 units SOLR      botulinum toxin Type A (BOTOX) 100 units SOLR injection Inject 100 Units into the muscle every 3 (three) months. Every 12 weeks at [facility] 2 vial    buPROPion (WELLBUTRIN XL) 150 MG 24 hr tablet Take 150 mg by mouth every morning.     cetirizine (ZYRTEC) 10 MG tablet Take 10 mg by mouth daily. Reported on 07/13/2015     EPINEPHrine 0.3 mg/0.3 mL IJ SOAJ injection Inject 0.3 mg into the muscle as needed for anaphylaxis. 1 each 5   hydrOXYzine (ATARAX) 50 MG tablet Take 50-100 mg by mouth 3 (three) times daily as needed.     hyoscyamine (LEVSIN SL) 0.125 MG SL tablet PLACE 1 TABLET UNDER THE TONGUE EVERY 6 (SIX) HOURS AS NEEDED. 120 tablet 1   Multiple Vitamin (MULTIVITAMIN) tablet Take 1 tablet by mouth daily.       nitrofurantoin, macrocrystal-monohydrate, (MACROBID) 100 MG capsule Take 1 capsule (100 mg total) by mouth as needed. Take 1 capsule as needed with intercourse 30 capsule 1   norethindrone-ethinyl estradiol (JUNEL 1/20) 1-20 MG-MCG tablet Take 1  tablet by mouth daily. Take ACTIVE pills only, skipping PLACEBOS 112 tablet 3   omeprazole (PRILOSEC) 40 MG capsule TAKE 1 CAPSULE BY MOUTH EVERY DAY 30 capsule 5   ondansetron (ZOFRAN) 8 MG tablet Take 1 tablet (8 mg total) by mouth 2 (two) times daily as needed for nausea or vomiting. 20 tablet 0   pregabalin (LYRICA) 150 MG capsule TAKE 1 CAPSULE BY MOUTH TWICE A DAY 180 capsule 0   Probiotic Product (PROBIOTIC-10 PO) Take 1 capsule by mouth daily.     promethazine (PHENERGAN) 12.5 MG tablet Take by mouth daily as needed.     SUMAtriptan (IMITREX) 25 MG tablet Take 25 mg by mouth every 2 (two) hours as needed for migraine. May repeat in 2 hours if headache persists or recurs.     tiZANidine (ZANAFLEX) 4 MG tablet TAKE 1 TABLET BY MOUTH EVERY 8 HOURS AS NEEDED FOR MUSCLE SPASMS 90 tablet 1   valACYclovir (VALTREX) 1000 MG tablet Take 1 tablet (1,000 mg total) by mouth daily. 90 tablet 3   No current facility-administered medications on file prior to visit.     Review of Systems  Constitutional:  Negative for fever.  HENT:  Positive for postnasal drip and rhinorrhea.   Respiratory:  Positive for cough (allergy related) and wheezing (allergy related). Negative for shortness of breath.   Cardiovascular:  Positive for chest pain, palpitations and leg swelling (at times).  Gastrointestinal:  Positive for abdominal pain (occ random sharp cramping pain).  Neurological:  Positive for light-headedness and headaches.       Objective:   Vitals:   07/16/22 1539  BP: 104/72  Pulse: (!) 110  Temp: 98.6 F (37 C)  SpO2: 98%   BP Readings from Last 3 Encounters:  07/16/22 104/72  07/03/22 110/78  05/16/22 124/80   Wt Readings from Last 3 Encounters:  07/16/22 190 lb (86.2 kg)  07/08/22 190 lb (86.2 kg)  07/03/22 193 lb 4.8 oz (87.7 kg)   Body mass index is 31.62 kg/m.    Physical Exam Constitutional:      General: She is not in acute distress.    Appearance: Normal appearance.   HENT:     Head: Normocephalic and atraumatic.  Eyes:     Conjunctiva/sclera: Conjunctivae normal.  Cardiovascular:     Rate and Rhythm: Normal rate and regular rhythm.     Heart sounds: Normal heart sounds.  Pulmonary:     Effort: Pulmonary effort is normal. No respiratory distress.     Breath sounds: Normal breath sounds. No wheezing.  Musculoskeletal:     Cervical back: Neck supple.     Right lower leg: No edema.     Left lower leg: No edema.  Lymphadenopathy:     Cervical: No cervical adenopathy.  Skin:    General: Skin is warm and dry.     Findings: No rash.  Neurological:     Mental Status: She is alert. Mental status is at baseline.  Psychiatric:        Mood and Affect: Mood normal.        Behavior: Behavior normal.        Lab Results  Component Value Date   WBC 6.8 01/15/2022   HGB 9.7 (L) 01/15/2022   HCT 29.9 (L) 01/15/2022   PLT 303.0 01/15/2022   GLUCOSE 93 01/15/2022   CHOL 178 01/15/2022   TRIG (H) 01/15/2022    412.0 Triglyceride is over 400; calculations on Lipids are invalid.   HDL 30.50 (L) 01/15/2022   LDLDIRECT 112.0 01/15/2022   LDLCALC 92 04/07/2013   ALT 14 01/15/2022   AST 23 01/15/2022   NA 138 01/15/2022   K 3.8 01/15/2022   CL 103 01/15/2022   CREATININE 1.00 01/15/2022   BUN 11 01/15/2022   CO2 26 01/15/2022   TSH 1.23 01/15/2022   INR 0.9 10/17/2020   HGBA1C 6.3 01/15/2022     Assessment & Plan:    See Problem List for Assessment and Plan of chronic medical problems.

## 2022-07-16 ENCOUNTER — Ambulatory Visit (INDEPENDENT_AMBULATORY_CARE_PROVIDER_SITE_OTHER): Payer: Medicare Other | Admitting: Internal Medicine

## 2022-07-16 VITALS — BP 104/72 | HR 110 | Temp 98.6°F | Ht 65.0 in | Wt 190.0 lb

## 2022-07-16 DIAGNOSIS — D649 Anemia, unspecified: Secondary | ICD-10-CM

## 2022-07-16 DIAGNOSIS — I10 Essential (primary) hypertension: Secondary | ICD-10-CM | POA: Diagnosis not present

## 2022-07-16 DIAGNOSIS — G901 Familial dysautonomia [Riley-Day]: Secondary | ICD-10-CM

## 2022-07-16 DIAGNOSIS — E282 Polycystic ovarian syndrome: Secondary | ICD-10-CM

## 2022-07-16 DIAGNOSIS — F419 Anxiety disorder, unspecified: Secondary | ICD-10-CM

## 2022-07-16 DIAGNOSIS — G43709 Chronic migraine without aura, not intractable, without status migrainosus: Secondary | ICD-10-CM

## 2022-07-16 DIAGNOSIS — F3289 Other specified depressive episodes: Secondary | ICD-10-CM

## 2022-07-16 DIAGNOSIS — E782 Mixed hyperlipidemia: Secondary | ICD-10-CM | POA: Diagnosis not present

## 2022-07-16 DIAGNOSIS — D509 Iron deficiency anemia, unspecified: Secondary | ICD-10-CM | POA: Diagnosis not present

## 2022-07-16 DIAGNOSIS — G4709 Other insomnia: Secondary | ICD-10-CM | POA: Diagnosis not present

## 2022-07-16 DIAGNOSIS — K219 Gastro-esophageal reflux disease without esophagitis: Secondary | ICD-10-CM

## 2022-07-16 DIAGNOSIS — R7303 Prediabetes: Secondary | ICD-10-CM | POA: Diagnosis not present

## 2022-07-16 LAB — LIPID PANEL
Cholesterol: 165 mg/dL (ref 0–200)
HDL: 34.5 mg/dL — ABNORMAL LOW (ref >39.00)
NonHDL: 130.53
Total CHOL/HDL Ratio: 5
Triglycerides: 280 mg/dL — ABNORMAL HIGH (ref 0.0–149.0)
VLDL: 56 mg/dL — ABNORMAL HIGH (ref 0.0–40.0)

## 2022-07-16 LAB — CBC WITH DIFFERENTIAL/PLATELET
Basophils Absolute: 0.1 10*3/uL (ref 0.0–0.1)
Basophils Relative: 0.9 % (ref 0.0–3.0)
Eosinophils Absolute: 0.1 10*3/uL (ref 0.0–0.7)
Eosinophils Relative: 1.1 % (ref 0.0–5.0)
HCT: 33.7 % — ABNORMAL LOW (ref 36.0–46.0)
Hemoglobin: 10.7 g/dL — ABNORMAL LOW (ref 12.0–15.0)
Lymphocytes Relative: 36.4 % (ref 12.0–46.0)
Lymphs Abs: 3.1 10*3/uL (ref 0.7–4.0)
MCHC: 31.6 g/dL (ref 30.0–36.0)
MCV: 69.5 fl — ABNORMAL LOW (ref 78.0–100.0)
Monocytes Absolute: 0.5 10*3/uL (ref 0.1–1.0)
Monocytes Relative: 5.3 % (ref 3.0–12.0)
Neutro Abs: 4.8 10*3/uL (ref 1.4–7.7)
Neutrophils Relative %: 56.3 % (ref 43.0–77.0)
Platelets: 369 10*3/uL (ref 150.0–400.0)
RBC: 4.84 Mil/uL (ref 3.87–5.11)
RDW: 16.3 % — ABNORMAL HIGH (ref 11.5–15.5)
WBC: 8.6 10*3/uL (ref 4.0–10.5)

## 2022-07-16 LAB — COMPREHENSIVE METABOLIC PANEL
ALT: 10 U/L (ref 0–35)
AST: 15 U/L (ref 0–37)
Albumin: 4.5 g/dL (ref 3.5–5.2)
Alkaline Phosphatase: 34 U/L — ABNORMAL LOW (ref 39–117)
BUN: 8 mg/dL (ref 6–23)
CO2: 25 mEq/L (ref 19–32)
Calcium: 10.1 mg/dL (ref 8.4–10.5)
Chloride: 102 mEq/L (ref 96–112)
Creatinine, Ser: 0.84 mg/dL (ref 0.40–1.20)
GFR: 85.65 mL/min (ref >60.00)
Glucose, Bld: 103 mg/dL — ABNORMAL HIGH (ref 70–99)
Potassium: 4.2 mEq/L (ref 3.5–5.1)
Sodium: 136 mEq/L (ref 135–145)
Total Bilirubin: 0.3 mg/dL (ref 0.2–1.2)
Total Protein: 7.9 g/dL (ref 6.0–8.3)

## 2022-07-16 LAB — IBC PANEL
Iron: 38 ug/dL — ABNORMAL LOW (ref 42–145)
Saturation Ratios: 5.7 % — ABNORMAL LOW (ref 20.0–50.0)
TIBC: 670.6 ug/dL — ABNORMAL HIGH (ref 250.0–450.0)
Transferrin: 479 mg/dL — ABNORMAL HIGH (ref 212.0–360.0)

## 2022-07-16 LAB — TSH: TSH: 1.34 u[IU]/mL (ref 0.35–5.50)

## 2022-07-16 LAB — HEMOGLOBIN A1C: Hgb A1c MFr Bld: 6.4 % (ref 4.6–6.5)

## 2022-07-16 LAB — FERRITIN: Ferritin: 3 ng/mL — ABNORMAL LOW (ref 10.0–291.0)

## 2022-07-16 MED ORDER — METFORMIN HCL 500 MG PO TABS: 500.0000 mg | ORAL_TABLET | Freq: Two times a day (BID) | ORAL | 2 refills | Status: DC

## 2022-07-16 MED ORDER — ATENOLOL 25 MG PO TABS: 25.0000 mg | ORAL_TABLET | Freq: Two times a day (BID) | ORAL | 1 refills | Status: DC

## 2022-07-16 MED ORDER — FENOFIBRATE 145 MG PO TABS: ORAL_TABLET | ORAL | 1 refills | Status: DC

## 2022-07-16 NOTE — Assessment & Plan Note (Signed)
Chronic Regular exercise and healthy diet encouraged Check lipid panel, cmp Continue atorvastatin 10 mg daily, fenofibrate 145 mg daily

## 2022-07-16 NOTE — Assessment & Plan Note (Signed)
Chronic Management per NP she is following with

## 2022-07-16 NOTE — Assessment & Plan Note (Addendum)
Chronic Follows with cardiology Continue atenolol 25 mg bid Not wearing compression socks - always too hot Consistent with good water intake Regular exercise encouraged - she has a 37 month old so is active

## 2022-07-16 NOTE — Assessment & Plan Note (Signed)
Chronic GERD controlled Continue omeprazole 40 mg daily 

## 2022-07-16 NOTE — Assessment & Plan Note (Signed)
Chronic Check a1c Low sugar / carb diet Stressed regular exercise  

## 2022-07-16 NOTE — Assessment & Plan Note (Signed)
Chronic On OCPs On metformin 500 mg bid

## 2022-07-16 NOTE — Assessment & Plan Note (Signed)
Chronic °Taking iron daily °Ck cbc, iron panel °

## 2022-07-16 NOTE — Assessment & Plan Note (Addendum)
Chronic Management per the headache center Getting botox Taking imitrex prn

## 2022-07-16 NOTE — Assessment & Plan Note (Signed)
Chronic Management per NP she is following with 

## 2022-07-16 NOTE — Assessment & Plan Note (Signed)
Chronic Improved Not currently on any medication

## 2022-07-17 ENCOUNTER — Other Ambulatory Visit: Payer: Self-pay

## 2022-07-17 DIAGNOSIS — T7800XA Anaphylactic reaction due to unspecified food, initial encounter: Secondary | ICD-10-CM | POA: Diagnosis not present

## 2022-07-17 LAB — LDL CHOLESTEROL, DIRECT: Direct LDL: 108 mg/dL

## 2022-07-19 LAB — TRYPTASE: Tryptase: 6.7 ug/L (ref 2.2–13.2)

## 2022-07-22 ENCOUNTER — Other Ambulatory Visit: Payer: Self-pay | Admitting: Internal Medicine

## 2022-07-22 ENCOUNTER — Encounter: Payer: Self-pay | Admitting: Allergy

## 2022-08-05 ENCOUNTER — Other Ambulatory Visit (HOSPITAL_COMMUNITY): Payer: Self-pay

## 2022-08-20 ENCOUNTER — Ambulatory Visit
Admission: RE | Admit: 2022-08-20 | Discharge: 2022-08-20 | Disposition: A | Discharge status: Home / Self Care | Payer: Medicare Other | Source: Ambulatory Visit | Attending: Internal Medicine | Admitting: Internal Medicine

## 2022-08-20 DIAGNOSIS — Z1231 Encounter for screening mammogram for malignant neoplasm of breast: Secondary | ICD-10-CM | POA: Diagnosis not present

## 2022-08-24 ENCOUNTER — Other Ambulatory Visit: Payer: Self-pay | Admitting: Nurse Practitioner

## 2022-08-24 DIAGNOSIS — N390 Urinary tract infection, site not specified: Secondary | ICD-10-CM

## 2022-08-26 NOTE — Telephone Encounter (Signed)
Medication refill request: macrobid 100mg   Last AEX:  07/08/22 Next AEX: nothing scheduled  Last MMG (if hormonal medication request): n/a Refill authorized: #30 with 1 rf pended for today

## 2022-08-27 DIAGNOSIS — G43719 Chronic migraine without aura, intractable, without status migrainosus: Secondary | ICD-10-CM | POA: Diagnosis not present

## 2022-09-28 ENCOUNTER — Other Ambulatory Visit: Payer: Self-pay

## 2022-09-28 ENCOUNTER — Encounter: Payer: Self-pay | Admitting: Internal Medicine

## 2022-09-28 ENCOUNTER — Encounter (HOSPITAL_BASED_OUTPATIENT_CLINIC_OR_DEPARTMENT_OTHER): Payer: Self-pay

## 2022-09-28 DIAGNOSIS — X501XXA Overexertion from prolonged static or awkward postures, initial encounter: Secondary | ICD-10-CM | POA: Diagnosis not present

## 2022-09-28 DIAGNOSIS — M7989 Other specified soft tissue disorders: Secondary | ICD-10-CM | POA: Insufficient documentation

## 2022-09-28 DIAGNOSIS — S8992XA Unspecified injury of left lower leg, initial encounter: Secondary | ICD-10-CM | POA: Diagnosis present

## 2022-09-28 DIAGNOSIS — S8392XA Sprain of unspecified site of left knee, initial encounter: Secondary | ICD-10-CM | POA: Diagnosis not present

## 2022-09-28 NOTE — ED Triage Notes (Signed)
POV from home, A&O x 4, GCS 15, amb to triage  2 weeks ago she sprained left knee, stepped over baby gate tonight and felt a pop with sharp pain in thigh and knee. Hx of ehler's danlos syndrome.

## 2022-09-29 ENCOUNTER — Emergency Department (HOSPITAL_BASED_OUTPATIENT_CLINIC_OR_DEPARTMENT_OTHER)
Admission: EM | Admit: 2022-09-29 | Discharge: 2022-09-29 | Disposition: A | Discharge status: Home / Self Care | Payer: Medicare Other | Attending: Emergency Medicine | Admitting: Emergency Medicine

## 2022-09-29 ENCOUNTER — Emergency Department (HOSPITAL_BASED_OUTPATIENT_CLINIC_OR_DEPARTMENT_OTHER): Payer: Medicare Other

## 2022-09-29 DIAGNOSIS — M25462 Effusion, left knee: Secondary | ICD-10-CM | POA: Diagnosis not present

## 2022-09-29 DIAGNOSIS — S80912A Unspecified superficial injury of left knee, initial encounter: Secondary | ICD-10-CM | POA: Diagnosis not present

## 2022-09-29 DIAGNOSIS — S838X2A Sprain of other specified parts of left knee, initial encounter: Secondary | ICD-10-CM | POA: Diagnosis not present

## 2022-09-29 DIAGNOSIS — S8392XA Sprain of unspecified site of left knee, initial encounter: Secondary | ICD-10-CM

## 2022-09-29 MED ORDER — HYDROCODONE-ACETAMINOPHEN 5-325 MG PO TABS
1.0000 | ORAL_TABLET | Freq: Once | ORAL | Status: AC
  Administered 2022-09-29: 1 via ORAL
  Filled 2022-09-29: qty 1

## 2022-09-29 NOTE — ED Provider Notes (Signed)
[place] [facility]ERGENC[facility] DEPARTMENT AT [facility] Provider Note   CSN: [id number] Arrival date & time: 09/28/22  2345     History  Chief Complaint  Patient presents with   Leg Injury    ABBIGAIL Floyd is a 42 y.o. female.  The history is provided by the patient.   Patient with Ehlers-Danlos syndrome presents with left knee pain.  Patient reports around 2 weeks ago she sprained her left knee.  She saw a chiropractor for this injury, and was told that the joint was not dislocated. She reports that she rested the knee and it appeared to be improving.  However several hours ago she stepped over a baby gate and when she stepped down she had immediate pain in the knee again.  She reports swelling in the knee.  She also felt a pop in the knee.  No other traumatic injuries. No other acute complaints  No chest pain shortness of breath.  No history of VTE Past Medical History:  Diagnosis Date   ANXIETY    BACK PAIN, CHRONIC    DYSAUTONOMIA 2011   "POTS" , recurrent syncope - follows with cards for same   GERD    HSV (herpes simplex virus) anogenital infection 06/2015   cervix swab   HYPERLIPIDEMIA    Kidney failure    MIGRAINE HEADACHE    Palpitations    POLYCYSTIC OVARIAN DISEASE    POTS (postural orthostatic tachycardia syndrome)    Recurrent upper respiratory infection (URI)    Tear meniscus knee 06/14/2010   right    Home Medications Prior to Admission medications   Medication Sig Start Date End Date Taking? Authorizing Provider  acetaminophen (TYLENOL) 325 MG tablet Take 2 tablets (650 mg total) by mouth every 6 (six) hours as needed (pain). 10/19/20   Arrien, York Ram, MD  atenolol (TENORMIN) 25 MG tablet Take 1 tablet (25 mg total) by mouth 2 (two) times daily. 07/16/22   Pincus Sanes, MD  atomoxetine (STRATTERA) 40 MG capsule Take 40 mg by mouth daily. 06/09/22   [provider]  atorvastatin (LIPITOR) 10 MG tablet TAKE 1 TABLET BY MOUTH EVERY DAY  05/09/22   Burns, Bobette Mo, MD  AUVELITY 45-105 MG TBCR PLEASE SEE ATTACHED FOR DETAILED DIRECTIONS 06/18/22   [provider]  b complex vitamins tablet Take 1 tablet by mouth daily.    [provider]  baclofen (LIORESAL) 10 MG tablet TAKE 1 TABLET BY MOUTH THREE TIMES A DAY AS NEEDED FOR MUSCLE SPASM 05/09/21   Pincus Sanes, MD  BOTOX 200 units SOLR  03/29/21   [provider]  botulinum toxin Type A (BOTOX) 100 units SOLR injection Inject 100 Units into the muscle every 3 (three) months. Every 12 weeks at [facility] 03/15/15   Newt Lukes, MD  buPROPion (WELLBUTRIN XL) 150 MG 24 hr tablet Take 150 mg by mouth every morning. 06/09/22   [provider]  cetirizine (ZYRTEC) 10 MG tablet Take 10 mg by mouth daily. Reported on 07/13/2015    [provider]  EPINEPHrine 0.3 mg/0.3 mL IJ SOAJ injection Inject 0.3 mg into the muscle as needed for anaphylaxis. 10/25/20   Pincus Sanes, MD  fenofibrate (TRICOR) 145 MG tablet TAKE 1 TABLET (145MG  TOTAL) BY MOUTH EVERY DAY. 07/16/22   Pincus Sanes, MD  hydrOXYzine (ATARAX) 50 MG tablet Take 50-100 mg by mouth 3 (three) times daily as needed. 06/09/22   [provider]  hyoscyamine (LEVSIN SL) 0.125 MG SL tablet PLACE 1 TABLET UNDER THE TONGUE EVERY 6 (SIX) HOURS AS NEEDED. 04/09/21   Pincus Sanes, MD  metFORMIN (GLUCOPHAGE) 500 MG tablet Take 1 tablet (500 mg total) by mouth 2 (two) times daily with a meal. 07/16/22   Burns, Bobette Mo, MD  Multiple Vitamin (MULTIVITAMIN) tablet Take 1 tablet by mouth daily.      [provider]  nitrofurantoin, macrocrystal-monohydrate, (MACROBID) 100 MG capsule TAKE 1 CAPSULE (100 MG TOTAL) BY MOUTH AS NEEDED. TAKE 1 CAPSULE AS NEEDED WITH INTERCOURSE 08/26/22   Wyline Beady A, NP  norethindrone-ethinyl estradiol (JUNEL 1/20) 1-20 MG-MCG tablet Take 1 tablet by mouth daily. Take ACTIVE pills only, skipping PLACEBOS 05/16/22   Wyline Beady A, NP   omeprazole (PRILOSEC) 40 MG capsule TAKE 1 CAPSULE BY MOUTH EVERY DAY 05/09/22   Pincus Sanes, MD  ondansetron (ZOFRAN) 8 MG tablet Take 1 tablet (8 mg total) by mouth 2 (two) times daily as needed for nausea or vomiting. 06/15/21   Al Decant, PA-C  pregabalin (LYRICA) 150 MG capsule TAKE 1 CAPSULE BY MOUTH TWICE A DAY 07/23/22   Pincus Sanes, MD  Probiotic Product (PROBIOTIC-10 PO) Take 1 capsule by mouth daily.    [provider]  promethazine (PHENERGAN) 12.5 MG tablet Take by mouth daily as needed.    [provider]  SUMAtriptan (IMITREX) 25 MG tablet Take 25 mg by mouth every 2 (two) hours as needed for migraine. May repeat in 2 hours if headache persists or recurs.    [provider]  tiZANidine (ZANAFLEX) 4 MG tablet TAKE 1 TABLET BY MOUTH EVERY 8 HOURS AS NEEDED FOR MUSCLE SPASMS 06/08/21   Burns, Bobette Mo, MD  valACYclovir (VALTREX) 1000 MG tablet Take 1 tablet (1,000 mg total) by mouth daily. 05/16/22   Olivia Mackie, NP      Allergies    Alpha-gal; Beef-derived products; Glycerol, iodinated; Milk-related compounds; and Pork-derived products    Review of Systems   Review of Systems  Respiratory:  Negative for shortness of breath.   Cardiovascular:  Negative for chest pain.  Musculoskeletal:  Positive for arthralgias and joint swelling.    Physical Exam Updated Vital Signs BP (!) 109/93   Pulse 100   Temp 98 F (36.7 C)   Resp 18   Ht 1.651 m (5\' 5" )   Wt 83.9 kg   LMP  (LMP Unknown)   SpO2 100%   BMI 30.79 kg/m  Physical Exam CONSTITUTIONAL: Well developed/well nourished HEAD: Normocephalic/atraumatic NEURO: Pt is awake/alert/appropriate, moves all extremitiesx4.  No facial droop.   EXTREMITIES: pulses normal/equal, full ROM Distal pulses equal and intact in both legs.  Mild swelling noted to the left knee but no erythema.  Patient can flex and extend the left knee but is limited due to pain.  There is no lower extremity  edema noted SKIN: warm, color normal PSYCH: no abnormalities of mood noted, alert and oriented to situation  ED Results / Procedures / Treatments   Labs (all labs ordered are listed, but only abnormal results are displayed) Labs Reviewed - No data to display  EKG None  Radiology DG Knee Complete 4 Views Left  Result Date: 09/29/2022 CLINICAL DATA:  Status post trauma. EXAM: LEFT KNEE - COMPLETE 4+ VIEW COMPARISON:  None Available. FINDINGS: No evidence of an acute fracture or dislocation. No evidence of arthropathy or other focal bone abnormality. A small joint effusion is suspected. IMPRESSION:  Small joint effusion without evidence of acute fracture or dislocation. Electronically Signed   By: Aram Candela M.D.   On: 09/29/2022 00:40    Procedures Procedures    Medications Ordered in ED Medications  HYDROcodone-acetaminophen (NORCO/VICODIN) 5-325 MG per tablet 1 tablet (has no administration in time range)    ED Course/ Medical Decision Making/ A&P                             Medical Decision Making Amount and/or Complexity of Data Reviewed Radiology: ordered.  Risk Prescription drug management.   Patient reports landing wrong after stepping down the left leg now having left knee pain.  No acute bony injury is noted on x-ray.  Plan to discharge home with crutches and follow-up with her orthopedist        Final Clinical Impression(s) / ED Diagnoses Final diagnoses:  Sprain of left knee, unspecified ligament, initial encounter    Rx / DC Orders ED Discharge Orders     None         Zadie Rhine, MD 09/29/22 [phone]

## 2022-10-04 ENCOUNTER — Telehealth: Payer: Self-pay | Admitting: *Deleted

## 2022-10-04 NOTE — Telephone Encounter (Signed)
Transition Care Management Unsuccessful Follow-up Telephone Call  Date of discharge and from where:  [facility] 09/29/2022  Attempts:  1st Attempt  Reason for unsuccessful TCM follow-up call:  No answer/busy

## 2022-10-08 ENCOUNTER — Telehealth: Payer: Self-pay | Admitting: *Deleted

## 2022-10-08 NOTE — Telephone Encounter (Signed)
Transition Care Management Follow-up Telephone Call Date of discharge and from where: [facility] 09/29/2022 How have you been since you were released from the hospital? Not a lot better but see ortho dr on Friday  Any questions or concerns? No  Items Reviewed: Did the pt receive and understand the discharge instructions provided? Yes  Medications obtained and verified? No  Other? No  Any new allergies since your discharge? No  Dietary orders reviewed? No Do you have support at home? Yes   Follow up appointments reviewed:   [facility] f/u appt confirmed? Yes  Scheduled tosee ortho dr on Friday  Are transportation arrangements needed? No  If their condition worsens, is the pt aware to call PCP or go to the Emergency Dept.? Yes Was the patient provided with contact information for the PCP's office or ED? Yes Was to pt encouraged to call back with questions or concerns? Yes

## 2022-10-11 DIAGNOSIS — M25562 Pain in left knee: Secondary | ICD-10-CM | POA: Diagnosis not present

## 2022-10-15 DIAGNOSIS — R531 Weakness: Secondary | ICD-10-CM | POA: Diagnosis not present

## 2022-10-15 DIAGNOSIS — M25562 Pain in left knee: Secondary | ICD-10-CM | POA: Diagnosis not present

## 2022-10-15 DIAGNOSIS — M2392 Unspecified internal derangement of left knee: Secondary | ICD-10-CM | POA: Diagnosis not present

## 2022-10-15 DIAGNOSIS — M25462 Effusion, left knee: Secondary | ICD-10-CM | POA: Diagnosis not present

## 2022-10-18 DIAGNOSIS — M25562 Pain in left knee: Secondary | ICD-10-CM | POA: Diagnosis not present

## 2022-10-21 ENCOUNTER — Other Ambulatory Visit: Payer: Self-pay | Admitting: Internal Medicine

## 2022-11-09 ENCOUNTER — Other Ambulatory Visit: Payer: Self-pay | Admitting: Internal Medicine

## 2022-12-03 ENCOUNTER — Ambulatory Visit: Payer: Medicare Other | Admitting: Psychiatry

## 2022-12-03 ENCOUNTER — Encounter: Payer: Self-pay | Admitting: Psychiatry

## 2022-12-03 ENCOUNTER — Telehealth: Payer: Self-pay | Admitting: Psychiatry

## 2022-12-03 VITALS — BP 108/70 | HR 96 | Ht 65.0 in | Wt 184.8 lb

## 2022-12-03 DIAGNOSIS — G43709 Chronic migraine without aura, not intractable, without status migrainosus: Secondary | ICD-10-CM | POA: Diagnosis not present

## 2022-12-03 MED ORDER — RIZATRIPTAN BENZOATE 10 MG PO TABS: 10.0000 mg | ORAL_TABLET | ORAL | 6 refills | Status: DC | PRN

## 2022-12-03 MED ORDER — ONABOTULINUMTOXINA 200 UNITS IJ SOLR: INTRAMUSCULAR | 2 refills | Status: DC

## 2022-12-03 NOTE — Progress Notes (Signed)
Referring:  Monia Pouch., MD [address] [place],  [place] [address]  PCP: Pincus Sanes, MD  Neurology was asked to evaluate Kayla Floyd, a 42 year old female for a chief complaint of headaches.  Our recommendations of care will be communicated by shared medical record.    CC:  headaches  History provided from self  HPI:  Medical co-morbidities: HTN, HLD, dysautonomia, Ehlers Danlos, TMJ  The patient presents for evaluation of chronic migraines. She previously followed in [place] for migraine management, and is here to establish care as her previous clinic has shut down. Migraines are associated with photophobia, phonophobia, and nausea. She does Botox for prevention which helps significantly. Prior to Botox she had constant migraines, now she has only 3 per month.  Takes Imitrex as needed but this is not very effective. Usually has to take ibuprofen and tylenol with it.  Headache History: Onset: several years ago Aura: none Associated Symptoms:  Photophobia: yes  Phonophobia: yes  Nausea: yes Vomiting: yes Worse with activity?: yes Duration of headaches: several hours  Migraine days per month: 3 Headache free days per month: 27  Current Treatment: Abortive Imitrex 100 mg PRN  Preventative Botox  Prior Therapies                                 Prevention: Cymbalta 60 mg daily Zoloft 50 mg daily Lyrica 150 mg BID Topamax - increased ocular pressure Atenolol 25 mg - hypotension Aimovig - lack of efficacy Botox - helpful  Rescue: Diclofenac 75 mg Tizanidine 4 mg PRN Baclofen 10 mg PRN Imitrex 100 mg PRN - lack of efficacy   LABS: CBC    Component Value Date/Time   WBC 8.6 07/16/2022 1628   RBC 4.84 07/16/2022 1628   HGB 10.7 (L) 07/16/2022 1628   HCT 33.7 (L) 07/16/2022 1628   PLT 369.0 07/16/2022 1628   MCV 69.5 Repeated and verified X2. (L) 07/16/2022 1628   MCH 23.6 (L) 06/15/2021 1322   MCHC 31.6 07/16/2022 1628    RDW 16.3 (H) 07/16/2022 1628   LYMPHSABS 3.1 07/16/2022 1628   MONOABS 0.5 07/16/2022 1628   EOSABS 0.1 07/16/2022 1628   BASOSABS 0.1 07/16/2022 1628      Latest Ref Rng & Units 07/16/2022    4:28 PM 01/15/2022    2:41 PM 06/15/2021    1:22 PM  CMP  Glucose 70 - 99 mg/dL 604  93  540   BUN 6 - 23 mg/dL 8  11  15    Creatinine 0.40 - 1.20 mg/dL 9.81  1.91  4.78   Sodium 135 - 145 mEq/L 136  138  139   Potassium 3.5 - 5.1 mEq/L 4.2  3.8  4.0   Chloride 96 - 112 mEq/L 102  103  105   CO2 19 - 32 mEq/L 25  26  22    Calcium 8.4 - 10.5 mg/dL 29.5  9.7  62.1   Total Protein 6.0 - 8.3 g/dL 7.9  7.5    Total Bilirubin 0.2 - 1.2 mg/dL 0.3  0.2    Alkaline Phos 39 - 117 U/L 34  27    AST 0 - 37 U/L 15  23    ALT 0 - 35 U/L 10  14       IMAGING:  N/a  Current Outpatient Medications on File Prior to Visit  Medication Sig Dispense Refill  acetaminophen (TYLENOL) 325 MG tablet Take 2 tablets (650 mg total) by mouth every 6 (six) hours as needed (pain).     atenolol (TENORMIN) 25 MG tablet Take 1 tablet (25 mg total) by mouth 2 (two) times daily. 180 tablet 1   atomoxetine (STRATTERA) 40 MG capsule Take 40 mg by mouth daily.     atorvastatin (LIPITOR) 10 MG tablet TAKE 1 TABLET BY MOUTH EVERY DAY 30 tablet 5   AUVELITY 45-105 MG TBCR PLEASE SEE ATTACHED FOR DETAILED DIRECTIONS     b complex vitamins tablet Take 1 tablet by mouth daily.     baclofen (LIORESAL) 10 MG tablet TAKE 1 TABLET BY MOUTH THREE TIMES A DAY AS NEEDED FOR MUSCLE SPASM 90 tablet 2   BOTOX 200 units SOLR      botulinum toxin Type A (BOTOX) 100 units SOLR injection Inject 100 Units into the muscle every 3 (three) months. Every 12 weeks at [facility] 2 vial    cetirizine (ZYRTEC) 10 MG tablet Take 10 mg by mouth daily. Reported on 07/13/2015     EPINEPHrine 0.3 mg/0.3 mL IJ SOAJ injection Inject 0.3 mg into the muscle as needed for anaphylaxis. 1 each 5   fenofibrate (TRICOR) 145 MG tablet TAKE 1 TABLET (145MG  TOTAL)  BY MOUTH EVERY DAY. 90 tablet 1   hydrOXYzine (ATARAX) 50 MG tablet Take 50-100 mg by mouth 3 (three) times daily as needed.     hyoscyamine (LEVSIN SL) 0.125 MG SL tablet PLACE 1 TABLET UNDER THE TONGUE EVERY 6 (SIX) HOURS AS NEEDED. 120 tablet 1   metFORMIN (GLUCOPHAGE) 500 MG tablet Take 1 tablet (500 mg total) by mouth 2 (two) times daily with a meal. 180 tablet 2   Multiple Vitamin (MULTIVITAMIN) tablet Take 1 tablet by mouth daily.       nitrofurantoin, macrocrystal-monohydrate, (MACROBID) 100 MG capsule TAKE 1 CAPSULE (100 MG TOTAL) BY MOUTH AS NEEDED. TAKE 1 CAPSULE AS NEEDED WITH INTERCOURSE 30 capsule 1   norethindrone-ethinyl estradiol (JUNEL 1/20) 1-20 MG-MCG tablet Take 1 tablet by mouth daily. Take ACTIVE pills only, skipping PLACEBOS 112 tablet 3   omeprazole (PRILOSEC) 40 MG capsule TAKE 1 CAPSULE BY MOUTH EVERY DAY 30 capsule 5   ondansetron (ZOFRAN) 8 MG tablet Take 1 tablet (8 mg total) by mouth 2 (two) times daily as needed for nausea or vomiting. 20 tablet 0   pregabalin (LYRICA) 150 MG capsule TAKE 1 CAPSULE BY MOUTH TWICE A DAY 180 capsule 0   Probiotic Product (PROBIOTIC-10 PO) Take 1 capsule by mouth daily.     promethazine (PHENERGAN) 12.5 MG tablet Take by mouth daily as needed.     tiZANidine (ZANAFLEX) 4 MG tablet TAKE 1 TABLET BY MOUTH EVERY 8 HOURS AS NEEDED FOR MUSCLE SPASMS 90 tablet 1   valACYclovir (VALTREX) 1000 MG tablet Take 1 tablet (1,000 mg total) by mouth daily. 90 tablet 3   buPROPion (WELLBUTRIN XL) 150 MG 24 hr tablet Take 150 mg by mouth every morning. (Patient not taking: Reported on 12/03/2022)     No current facility-administered medications on file prior to visit.     Allergies: Allergies  Allergen Reactions   Alpha-Gal Anaphylaxis   Beef-Derived Products     anaphylaxes    Glycerol, Iodinated Anaphylaxis    anaphylaxes    Milk-Related Compounds Anaphylaxis    Cheese, whey protein, gelatin   Pork-Derived Products     anaplaxis     Family History: Family History  Problem Relation  Age of Onset   Bipolar disorder Mother    Stroke Mother        Age of 58 while on OCP   Other Father        MVA   Heart disease Father    Heart attack Father    Colon cancer Maternal Grandmother 44   Cancer Maternal Grandmother    Hypertension Sister    Hypertension Sister    Migraines Sister    Breast cancer Paternal Grandmother        dx in her 29s-40s   Cancer Paternal Uncle        ocular   Breast cancer Paternal Aunt 2   Lung cancer Paternal Uncle      Past Medical History: Past Medical History:  Diagnosis Date   ANXIETY    BACK PAIN, CHRONIC    DYSAUTONOMIA 2011   "POTS" , recurrent syncope - follows with cards for same   GERD    HSV (herpes simplex virus) anogenital infection 06/2015   cervix swab   HYPERLIPIDEMIA    Kidney failure    MIGRAINE HEADACHE    Palpitations    POLYCYSTIC OVARIAN DISEASE    POTS (postural orthostatic tachycardia syndrome)    Recurrent upper respiratory infection (URI)    Tear meniscus knee 06/14/2010   right    Past Surgical History Past Surgical History:  Procedure Laterality Date   Cervical spine  2010   ACDF c5-6   crevical fusion   2010   evs     LUMBAR LAMINECTOMY  2008   TONSILLECTOMY AND ADENOIDECTOMY      Social History: Social History   Tobacco Use   Smoking status: Former    Current packs/day: 0.00    Average packs/day: 0.1 packs/day for 10.0 years (1.0 ttl pk-yrs)    Types: Cigarettes    Quit date: 2024    Years since quitting: 0.6   Smokeless tobacco: Never   Tobacco comments:    3-4 cigarettes per day  Vaping Use   Vaping status: Never Used  Substance Use Topics   Alcohol use: Not Currently   Drug use: No    ROS: Negative for fevers, chills. Positive for headaches. All other systems reviewed and negative unless stated otherwise in HPI.   Physical Exam:   Vital Signs: BP 108/70 (BP Location: Right Arm, Patient Position: Sitting, Cuff  Size: Normal)   Pulse 96   Ht 5\' 5"  (1.651 m)   Wt 184 lb 12.8 oz (83.8 kg)   BMI 30.75 kg/m  GENERAL: well appearing,in no acute distress,alert SKIN:  Color, texture, turgor normal. No rashes or lesions HEAD:  Normocephalic/atraumatic. CV:  RRR RESP: Normal respiratory effort  NEUROLOGICAL: Mental Status: Alert, oriented to person, place and time,Follows commands Cranial Nerves: PERRL, visual fields intact to confrontation, extraocular movements intact, facial sensation intact, no facial droop or ptosis, hearing grossly intact, no dysarthria Motor: muscle strength 5/5 both upper and lower extremities,no drift, normal tone Reflexes: 2+ throughout Sensation: decreased sensation left outer thigh (hx of meralgia paresthetica), otherwise intact to light touch all 4 extremities Coordination: Finger-to- nose-finger intact bilaterally Gait: normal-based   IMPRESSION: 42 year old female with a history of HTN, HLD, dysautonomia, Ehlers Danlos, TMJ who presents to establish care for migraines. Migraines have been well-controlled on Botox. Will plan to continue Botox in our clinic. Imitrex has not been effective for rescue. Will switch to Maxalt as needed.  PLAN: -Prevention: Continue Botox -Rescue: Start Maxalt 10 mg  PRN -Next steps: consider gepant for rescue  I spent a total of 25 minutes chart reviewing and counseling the patient. Headache education was done. Discussed treatment options including preventive and acute medications. Discussed medication side effects, adverse reactions and drug interactions. Written educational materials and patient instructions outlining all of the above were given.  Follow-up: for Botox   Ocie Doyne, MD 12/03/2022   3:19 PM

## 2022-12-03 NOTE — Addendum Note (Signed)
Addended by: Aura Camps on: 12/03/2022 04:10 PM   Modules accepted: Orders

## 2022-12-03 NOTE — Telephone Encounter (Signed)
Rx sent 

## 2022-12-03 NOTE — Telephone Encounter (Signed)
Received teams message from [place] requesting new start for Botox. I submitted auth via [facility] website and received approval. Auth: [id number] (12/03/22-12/03/23)  Please send rx to [facility].

## 2022-12-09 NOTE — Telephone Encounter (Signed)
Can you please resend to [facility] location below?  [facility] [place], [place] - [address], [place] [address] Phone: [phone]  Fax: [phone]

## 2022-12-09 NOTE — Telephone Encounter (Signed)
I called [facility] to see about getting delivery scheduled for pt's Botox, she states she is due for injection 9/19. Spoke with [employer]. She informed me that Botox was delivered on 8/30 to pt's old Neuro office @ [address]. We would need to coordinate with their office how to get the Botox over to [place]. Marcelle Smiling also requested new rx sent over under Dr. Delena Bali, sent message to nurses requesting that this is sent.  I called [facility] and LVM for Botox Coordinator Okey Regal, left message explaining situation with my direct callback #.

## 2022-12-10 NOTE — Telephone Encounter (Signed)
Attempted to call [facility] again, answering prompt stated that office was closed. I called at 4:35 pm and they are open until 5:00 pm but phones would not let me through. Will attempt calling again tomorrow.

## 2022-12-11 NOTE — Telephone Encounter (Signed)
Called pt to inform her that I haven't heard back from [facility], she states she has been in contact with them and will pick up her Botox on Friday and bring it with her to her appointment on Monday.

## 2022-12-11 NOTE — Telephone Encounter (Signed)
Left another VM with Okey Regal asking for a call back regarding Botox delivery.

## 2022-12-12 NOTE — Telephone Encounter (Signed)
Okey Regal from [facility] returned my call and did confirm that pt is able to come pick Botox up. She will bring it to her appt Monday.

## 2022-12-13 ENCOUNTER — Telehealth: Payer: Self-pay | Admitting: Psychiatry

## 2022-12-13 NOTE — Telephone Encounter (Signed)
Pt rescheduling Botox appt due to daughter has hand foot mouth virus. Rescheduled pt's Botox appt with Shanda Bumps.

## 2022-12-16 ENCOUNTER — Ambulatory Visit: Payer: Medicare Other | Admitting: Neurology

## 2022-12-24 ENCOUNTER — Other Ambulatory Visit: Payer: Self-pay | Admitting: Internal Medicine

## 2022-12-28 ENCOUNTER — Encounter: Payer: Self-pay | Admitting: Internal Medicine

## 2022-12-30 ENCOUNTER — Other Ambulatory Visit: Payer: Self-pay

## 2022-12-30 MED ORDER — EPINEPHRINE 0.3 MG/0.3ML IJ SOAJ: 0.3000 mg | INTRAMUSCULAR | 5 refills | Status: AC | PRN

## 2023-01-01 ENCOUNTER — Encounter: Payer: Self-pay | Admitting: Adult Health

## 2023-01-01 ENCOUNTER — Ambulatory Visit: Payer: Medicare Other | Admitting: Adult Health

## 2023-01-01 DIAGNOSIS — G43709 Chronic migraine without aura, not intractable, without status migrainosus: Secondary | ICD-10-CM

## 2023-01-01 MED ORDER — ONABOTULINUMTOXINA 200 UNITS IJ SOLR
155.0000 [IU] | Freq: Once | INTRAMUSCULAR | Status: AC
Start: 2023-01-01 — End: 2023-01-01
  Administered 2023-01-01: 165 [IU] via INTRAMUSCULAR

## 2023-01-01 NOTE — Progress Notes (Signed)
Returns for initial botox injection in this office. Reports prior injection on 08/27/2022 by [facility] in [place]. Continued benefit with botox injections. She does have jaw pain and notes teeth clenching and grinding which worsens her headaches. Has not yet tried rizatriptan, use of Advil or Tylenol as needed. She was advised to call if no benefit or unable to tolerate rizatriptan.  Lack of benefit with sumatriptan.  Tolerated procedure well today. Return in 3 months for repeat injection.         Consent Form Botulism Toxin Injection For Chronic Migraine    Reviewed orally with patient, additionally signature is on file:  Botulism toxin has been approved by the Federal drug administration for treatment of chronic migraine. Botulism toxin does not cure chronic migraine and it may not be effective in some patients.  The administration of botulism toxin is accomplished by injecting a small amount of toxin into the muscles of the neck and head. Dosage must be titrated for each individual. Any benefits resulting from botulism toxin tend to wear off after 3 months with a repeat injection required if benefit is to be maintained. Injections are usually done every 3-4 months with maximum effect peak achieved by about 2 or 3 weeks. Botulism toxin is expensive and you should be sure of what costs you will incur resulting from the injection.  The side effects of botulism toxin use for chronic migraine may include:   -Transient, and usually mild, facial weakness with facial injections  -Transient, and usually mild, head or neck weakness with head/neck injections  -Reduction or loss of forehead facial animation due to forehead muscle weakness  -Eyelid drooping  -Dry eye  -Pain at the site of injection or bruising at the site of injection  -Double vision  -Potential unknown long term risks   Contraindications: You should not have Botox if you are pregnant, nursing, allergic to  albumin, have an infection, skin condition, or muscle weakness at the site of the injection, or have myasthenia gravis, Lambert-Eaton syndrome, or ALS.  It is also possible that as with any injection, there may be an allergic reaction or no effect from the medication. Reduced effectiveness after repeated injections is sometimes seen and rarely infection at the injection site may occur. All care will be taken to prevent these side effects. If therapy is given over a long time, atrophy and wasting in the muscle injected may occur. Occasionally the patient's become refractory to treatment because they develop antibodies to the toxin. In this event, therapy needs to be modified.  I have read the above information and consent to the administration of botulism toxin.    BOTOX PROCEDURE NOTE FOR MIGRAINE HEADACHE  Contraindications and precautions discussed with patient(above). Aseptic procedure was observed and patient tolerated procedure. Procedure performed by Ihor Austin, AGNP-BC.   The condition has existed for more than 6 months, and pt does not have a diagnosis of ALS, Myasthenia Gravis or Lambert-Eaton Syndrome.  Risks and benefits of injections discussed and pt agrees to proceed with the procedure.  Written consent obtained  These injections are medically necessary. Pt  receives good benefits from these injections. These injections do not cause sedations or hallucinations which the oral therapies may cause.   Description of procedure:  The patient was placed in a sitting position. The standard protocol was used for Botox as follows, with 5 units of Botox injected at each site:  -Procerus muscle, midline injection  -Corrugator muscle, bilateral injection  -  Frontalis muscle, bilateral injection, with 2 sites each side, medial injection was performed in the upper one third of the frontalis muscle, in the region vertical from the medial inferior edge of the superior orbital rim. The lateral  injection was again in the upper one third of the forehead vertically above the lateral limbus of the cornea, 1.5 cm lateral to the medial injection site.  -Temporalis muscle injection, 4 sites, bilaterally. The first injection was 3 cm above the tragus of the ear, second injection site was 1.5 cm to 3 cm up from the first injection site in line with the tragus of the ear. The third injection site was 1.5-3 cm forward between the first 2 injection sites. The fourth injection site was 1.5 cm posterior to the second injection site. 5th site laterally in the temporalis  muscleat the level of the outer canthus.  -Occipitalis muscle injection, 3 sites, bilaterally. The first injection was done one half way between the occipital protuberance and the tip of the mastoid process behind the ear. The second injection site was done lateral and superior to the first, 1 fingerbreadth from the first injection. The third injection site was 1 fingerbreadth superiorly and medially from the first injection site.  -Cervical paraspinal muscle injection, 2 sites, bilaterally. The first injection site was 1 cm from the midline of the cervical spine, 3 cm inferior to the lower border of the occipital protuberance. The second injection site was 1.5 cm superiorly and laterally to the first injection site.  -Trapezius muscle injection was performed at 3 sites, bilaterally. The first injection site was in the upper trapezius muscle halfway between the inflection point of the neck, and the acromion. The second injection site was one half way between the acromion and the first injection site. The third injection was done between the first injection site and the inflection point of the neck.  -Masseter muscle injection was performed at 1 site, bilaterally.     A total of 200 units of Botox was prepared, 165 units of Botox was injected as documented above, any Botox not injected was wasted. The patient tolerated the procedure well,  there were no complications of the above procedure.   Ihor Austin, AGNP-BC  [facility] [address] [place], [place] [address]  Phone [phone] Fax [phone] Note: This document was prepared with digital dictation and possible smart phrase technology. Any transcriptional errors that result from this process are unintentional.

## 2023-01-01 NOTE — Progress Notes (Signed)
Botox- 200 units x 1 vial Lot: [id number] Expiration: 02/2025 NDC: 5409-8119-14  Bacteriostatic 0.9% Sodium Chloride- 4 mL  Lot: [id number] Expiration: 02/01/2023 NDC: 2130-8657-84  Dx: O96.295 S/P Witnessed by Berneice Gandy, RMA

## 2023-01-19 ENCOUNTER — Other Ambulatory Visit: Payer: Self-pay | Admitting: Internal Medicine

## 2023-01-19 ENCOUNTER — Encounter: Payer: Self-pay | Admitting: Internal Medicine

## 2023-01-19 NOTE — Progress Notes (Signed)
Subjective:    Patient ID: Kayla Floyd, female    DOB: 1980-05-24, 42 y.o.   MRN: [id number]     HPI Kayla Floyd is here for follow up of her chronic medical problems.    Medications and allergies reviewed with patient and updated if appropriate.  Current Outpatient Medications on File Prior to Visit  Medication Sig Dispense Refill  . acetaminophen (TYLENOL) 325 MG tablet Take 2 tablets (650 mg total) by mouth every 6 (six) hours as needed (pain).    Marland Kitchen atorvastatin (LIPITOR) 10 MG tablet TAKE 1 TABLET BY MOUTH EVERY DAY 30 tablet 5  . b complex vitamins tablet Take 1 tablet by mouth daily.    . baclofen (LIORESAL) 10 MG tablet TAKE 1 TABLET BY MOUTH THREE TIMES A DAY AS NEEDED FOR MUSCLE SPASM 90 tablet 2  . botulinum toxin Type A (BOTOX) 200 units injection Inject 155 units into the muscles of head, and neck every 3 months discard remainder 1 each 2  . cetirizine (ZYRTEC) 10 MG tablet Take 10 mg by mouth daily. Reported on 07/13/2015    . EPINEPHrine 0.3 mg/0.3 mL IJ SOAJ injection Inject 0.3 mg into the muscle as needed for anaphylaxis. 1 each 5  . hydrOXYzine (ATARAX) 50 MG tablet Take 50-100 mg by mouth 3 (three) times daily as needed.    . Multiple Vitamin (MULTIVITAMIN) tablet Take 1 tablet by mouth daily.      . norethindrone-ethinyl estradiol (JUNEL 1/20) 1-20 MG-MCG tablet Take 1 tablet by mouth daily. Take ACTIVE pills only, skipping PLACEBOS 112 tablet 3  . omeprazole (PRILOSEC) 40 MG capsule TAKE 1 CAPSULE BY MOUTH EVERY DAY 30 capsule 5  . ondansetron (ZOFRAN) 8 MG tablet Take 1 tablet (8 mg total) by mouth 2 (two) times daily as needed for nausea or vomiting. 20 tablet 0  . Probiotic Product (PROBIOTIC-10 PO) Take 1 capsule by mouth daily.    . promethazine (PHENERGAN) 12.5 MG tablet Take by mouth daily as needed.    . rizatriptan (MAXALT) 10 MG tablet Take 1 tablet (10 mg total) by mouth as needed for migraine. May repeat in 2 hours if needed 10 tablet 6  .  tiZANidine (ZANAFLEX) 4 MG tablet TAKE 1 TABLET BY MOUTH EVERY 8 HOURS AS NEEDED FOR MUSCLE SPASMS 90 tablet 1  . valACYclovir (VALTREX) 1000 MG tablet Take 1 tablet (1,000 mg total) by mouth daily. 90 tablet 3   No current facility-administered medications on file prior to visit.     Review of Systems     Objective:  There were no vitals filed for this visit. BP Readings from Last 3 Encounters:  02/14/23 100/70  12/03/22 108/70  09/28/22 (!) 109/93   Wt Readings from Last 3 Encounters:  02/14/23 187 lb (84.8 kg)  12/03/22 184 lb 12.8 oz (83.8 kg)  09/28/22 185 lb (83.9 kg)   There is no height or weight on file to calculate BMI.    Physical Exam     Lab Results  Component Value Date   WBC 8.6 07/16/2022   HGB 10.7 (L) 07/16/2022   HCT 33.7 (L) 07/16/2022   PLT 369.0 07/16/2022   GLUCOSE 103 (H) 07/16/2022   CHOL 165 07/16/2022   TRIG 280.0 (H) 07/16/2022   HDL 34.50 (L) 07/16/2022   LDLDIRECT 108.0 07/16/2022   LDLCALC 92 04/07/2013   ALT 10 07/16/2022   AST 15 07/16/2022   NA 136 07/16/2022   K 4.2 07/16/2022  CL 102 07/16/2022   CREATININE 0.84 07/16/2022   BUN 8 07/16/2022   CO2 25 07/16/2022   TSH 1.34 07/16/2022   INR 0.9 10/17/2020   HGBA1C 6.4 07/16/2022     Assessment & Plan:    See Problem List for Assessment and Plan of chronic medical problems.    This encounter was created in error - please disregard.

## 2023-01-19 NOTE — Patient Instructions (Addendum)
      Blood work was ordered.   The lab is on the first floor.    Medications changes include :       A referral was ordered and someone will call you to schedule an appointment.     Return in about 6 months (around 07/24/2023) for Physical Exam.

## 2023-01-24 ENCOUNTER — Encounter: Payer: Medicare Other | Admitting: Internal Medicine

## 2023-01-24 DIAGNOSIS — G8929 Other chronic pain: Secondary | ICD-10-CM

## 2023-01-24 DIAGNOSIS — G43709 Chronic migraine without aura, not intractable, without status migrainosus: Secondary | ICD-10-CM

## 2023-01-24 DIAGNOSIS — G901 Familial dysautonomia [Riley-Day]: Secondary | ICD-10-CM

## 2023-01-24 DIAGNOSIS — E782 Mixed hyperlipidemia: Secondary | ICD-10-CM

## 2023-01-24 DIAGNOSIS — F419 Anxiety disorder, unspecified: Secondary | ICD-10-CM

## 2023-01-24 DIAGNOSIS — D509 Iron deficiency anemia, unspecified: Secondary | ICD-10-CM

## 2023-01-24 DIAGNOSIS — R7303 Prediabetes: Secondary | ICD-10-CM

## 2023-01-24 DIAGNOSIS — K219 Gastro-esophageal reflux disease without esophagitis: Secondary | ICD-10-CM

## 2023-01-24 DIAGNOSIS — F3289 Other specified depressive episodes: Secondary | ICD-10-CM

## 2023-01-24 DIAGNOSIS — G4709 Other insomnia: Secondary | ICD-10-CM

## 2023-01-30 ENCOUNTER — Other Ambulatory Visit: Payer: Self-pay | Admitting: Internal Medicine

## 2023-01-30 ENCOUNTER — Other Ambulatory Visit: Payer: Self-pay | Admitting: Nurse Practitioner

## 2023-01-30 DIAGNOSIS — N390 Urinary tract infection, site not specified: Secondary | ICD-10-CM

## 2023-01-31 ENCOUNTER — Encounter: Payer: Self-pay | Admitting: Nurse Practitioner

## 2023-01-31 ENCOUNTER — Other Ambulatory Visit: Payer: Self-pay | Admitting: Nurse Practitioner

## 2023-01-31 DIAGNOSIS — B3731 Acute candidiasis of vulva and vagina: Secondary | ICD-10-CM

## 2023-01-31 DIAGNOSIS — N760 Acute vaginitis: Secondary | ICD-10-CM

## 2023-01-31 DIAGNOSIS — N898 Other specified noninflammatory disorders of vagina: Secondary | ICD-10-CM

## 2023-01-31 NOTE — Telephone Encounter (Signed)
Med refill request: Macrobid (TW pt) Last AEX: 05/16/22 Next AEX: not scheduled Last MMG (if hormonal med) 08/20/22 Refill authorized: Please Advise, #[phone], 1 RF for after IC

## 2023-02-03 MED ORDER — FLUCONAZOLE 150 MG PO TABS: 150.0000 mg | ORAL_TABLET | Freq: Once | ORAL | 0 refills | Status: AC

## 2023-02-03 NOTE — Telephone Encounter (Signed)
Ok to send fluconazole x 1

## 2023-02-13 NOTE — Progress Notes (Signed)
Subjective:    Patient ID: Kayla Floyd, female    DOB: [birth year], 42 y.o.   MRN: [id number]      HPI Kayla Floyd is here for a Physical exam and her chronic medical problems.   Has not been able to keep up with fluids.  Her daughter is almost a year and a half old and she is very busy caring for her and chasing after her which makes it difficult for her to do everything she needs to do for herself.   Medications and allergies reviewed with patient and updated if appropriate.  Current Outpatient Medications on File Prior to Visit  Medication Sig Dispense Refill   acetaminophen (TYLENOL) 325 MG tablet Take 2 tablets (650 mg total) by mouth every 6 (six) hours as needed (pain).     atenolol (TENORMIN) 25 MG tablet TAKE 1 TABLET BY MOUTH TWICE A DAY 180 tablet 1   atomoxetine (STRATTERA) 40 MG capsule Take 40 mg by mouth daily.     atorvastatin (LIPITOR) 10 MG tablet TAKE 1 TABLET BY MOUTH EVERY DAY 30 tablet 5   AUVELITY 45-105 MG TBCR PLEASE SEE ATTACHED FOR DETAILED DIRECTIONS     b complex vitamins tablet Take 1 tablet by mouth daily.     baclofen (LIORESAL) 10 MG tablet TAKE 1 TABLET BY MOUTH THREE TIMES A DAY AS NEEDED FOR MUSCLE SPASM 90 tablet 2   botulinum toxin Type A (BOTOX) 200 units injection Inject 155 units into the muscles of head, and neck every 3 months discard remainder 1 each 2   buPROPion (WELLBUTRIN XL) 150 MG 24 hr tablet Take 150 mg by mouth every morning.     cetirizine (ZYRTEC) 10 MG tablet Take 10 mg by mouth daily. Reported on 07/13/2015     EPINEPHrine 0.3 mg/0.3 mL IJ SOAJ injection Inject 0.3 mg into the muscle as needed for anaphylaxis. 1 each 5   fenofibrate (TRICOR) 145 MG tablet TAKE 1 TABLET (145MG  TOTAL) BY MOUTH EVERY DAY. 90 tablet 1   hydrOXYzine (ATARAX) 50 MG tablet Take 50-100 mg by mouth 3 (three) times daily as needed.     hyoscyamine (LEVSIN SL) 0.125 MG SL tablet PLACE 1 TABLET UNDER THE TONGUE EVERY 6 (SIX) HOURS AS NEEDED. 120 tablet 1    metFORMIN (GLUCOPHAGE) 500 MG tablet Take 1 tablet (500 mg total) by mouth 2 (two) times daily with a meal. 180 tablet 2   Multiple Vitamin (MULTIVITAMIN) tablet Take 1 tablet by mouth daily.       nitrofurantoin, macrocrystal-monohydrate, (MACROBID) 100 MG capsule TAKE 1 CAPSULE BY MOUTH AS NEEDED WITH INTERCOURSE 30 capsule 1   norethindrone-ethinyl estradiol (JUNEL 1/20) 1-20 MG-MCG tablet Take 1 tablet by mouth daily. Take ACTIVE pills only, skipping PLACEBOS 112 tablet 3   omeprazole (PRILOSEC) 40 MG capsule TAKE 1 CAPSULE BY MOUTH EVERY DAY 30 capsule 5   ondansetron (ZOFRAN) 8 MG tablet Take 1 tablet (8 mg total) by mouth 2 (two) times daily as needed for nausea or vomiting. 20 tablet 0   pregabalin (LYRICA) 150 MG capsule TAKE 1 CAPSULE BY MOUTH TWICE A DAY 180 capsule 0   Probiotic Product (PROBIOTIC-10 PO) Take 1 capsule by mouth daily.     promethazine (PHENERGAN) 12.5 MG tablet Take by mouth daily as needed.     rizatriptan (MAXALT) 10 MG tablet Take 1 tablet (10 mg total) by mouth as needed for migraine. May repeat in 2 hours if needed 10 tablet 6  tiZANidine (ZANAFLEX) 4 MG tablet TAKE 1 TABLET BY MOUTH EVERY 8 HOURS AS NEEDED FOR MUSCLE SPASMS 90 tablet 1   valACYclovir (VALTREX) 1000 MG tablet Take 1 tablet (1,000 mg total) by mouth daily. 90 tablet 3   No current facility-administered medications on file prior to visit.    Review of Systems  Constitutional:  Negative for fever.  Eyes:  Positive for visual disturbance (changes in vision).  Respiratory:  Positive for shortness of breath (with moderate exertion). Negative for cough and wheezing.   Cardiovascular:  Positive for chest pain and palpitations. Negative for leg swelling (has heaviness with standing long periods - does not wear compression socks).  Gastrointestinal:  Positive for abdominal pain (intermittent) and constipation. Negative for blood in stool and diarrhea.  Genitourinary:  Positive for dysuria (occ).   Musculoskeletal:  Positive for arthralgias (stiff, achy) and back pain (chronic - sees chiropractor).  Skin:  Negative for rash.  Neurological:  Positive for dizziness, light-headedness, numbness and headaches.  Psychiatric/Behavioral:  Positive for dysphoric mood. The patient is nervous/anxious.        Objective:   Vitals:   02/14/23 1414  BP: 100/70  Pulse: 80  Temp: 98.2 F (36.8 C)  SpO2: 97%   Filed Weights   02/14/23 1414  Weight: 187 lb (84.8 kg)   Body mass index is 31.12 kg/m.  BP Readings from Last 3 Encounters:  02/14/23 100/70  12/03/22 108/70  09/28/22 (!) 109/93    Wt Readings from Last 3 Encounters:  02/14/23 187 lb (84.8 kg)  12/03/22 184 lb 12.8 oz (83.8 kg)  09/28/22 185 lb (83.9 kg)       Physical Exam Constitutional: She appears well-developed and well-nourished. No distress.  HENT:  Head: Normocephalic and atraumatic.  Right Ear: External ear normal. Normal ear canal and TM Left Ear: External ear normal.  Normal ear canal and TM Mouth/Throat: Oropharynx is clear and moist.  Eyes: Conjunctivae normal.  Neck: Neck supple. No tracheal deviation present. No thyromegaly present.  No carotid bruit  Cardiovascular: Normal rate, regular rhythm and normal heart sounds.   No murmur heard.  No edema. Pulmonary/Chest: Effort normal and breath sounds normal. No respiratory distress. She has no wheezes. She has no rales.  Breast: deferred   Abdominal: Soft. She exhibits no distension. There is no tenderness.  Lymphadenopathy: She has no cervical adenopathy.  Skin: Skin is warm and dry. She is not diaphoretic.  Psychiatric: She has a normal mood and affect. Her behavior is normal.     Lab Results  Component Value Date   WBC 8.6 07/16/2022   HGB 10.7 (L) 07/16/2022   HCT 33.7 (L) 07/16/2022   PLT 369.0 07/16/2022   GLUCOSE 103 (H) 07/16/2022   CHOL 165 07/16/2022   TRIG 280.0 (H) 07/16/2022   HDL 34.50 (L) 07/16/2022   LDLDIRECT 108.0  07/16/2022   LDLCALC 92 04/07/2013   ALT 10 07/16/2022   AST 15 07/16/2022   NA 136 07/16/2022   K 4.2 07/16/2022   CL 102 07/16/2022   CREATININE 0.84 07/16/2022   BUN 8 07/16/2022   CO2 25 07/16/2022   TSH 1.34 07/16/2022   INR 0.9 10/17/2020   HGBA1C 6.4 07/16/2022         Assessment & Plan:   Physical exam: Screening blood work  ordered Exercise  none Weight  obese Substance abuse  none   Reviewed recommended immunizations.   Health Maintenance  Topic Date Due   COVID-19 Vaccine (  4 - 2023-24 season) 03/02/2023 (Originally 11/24/2022)   INFLUENZA VACCINE  06/23/2023 (Originally 10/24/2022)   Medicare Annual Wellness (AWV)  07/08/2023   Cervical Cancer Screening (HPV/Pap Cotest)  05/17/2027   DTaP/Tdap/Td (10 - Td or Tdap) 09/09/2031   Hepatitis C Screening  Completed   HIV Screening  Completed   HPV VACCINES  Aged Out          See Problem List for Assessment and Plan of chronic medical problems.

## 2023-02-13 NOTE — Patient Instructions (Addendum)
Blood work was ordered.       Medications changes include :   None   Call us today at [phone] to schedule a complimentary 15-minute phone consultation with our lead POTS specialist, Dr. Theador Hawthorne   Return in about 6 months (around 08/14/2023) for follow up.    Health Maintenance, Female Adopting a healthy lifestyle and getting preventive care are important in promoting health and wellness. Ask your health care provider about: The right schedule for you to have regular tests and exams. Things you can do on your own to prevent diseases and keep yourself healthy. What should I know about diet, weight, and exercise? Eat a healthy diet  Eat a diet that includes plenty of vegetables, fruits, low-fat dairy products, and lean protein. Do not eat a lot of foods that are high in solid fats, added sugars, or sodium. Maintain a healthy weight Body mass index (BMI) is used to identify weight problems. It estimates body fat based on height and weight. Your health care provider can help determine your BMI and help you achieve or maintain a healthy weight. Get regular exercise Get regular exercise. This is one of the most important things you can do for your health. Most adults should: Exercise for at least 150 minutes each week. The exercise should increase your heart rate and make you sweat (moderate-intensity exercise). Do strengthening exercises at least twice a week. This is in addition to the moderate-intensity exercise. Spend less time sitting. Even light physical activity can be beneficial. Watch cholesterol and blood lipids Have your blood tested for lipids and cholesterol at 43 years of age, then have this test every 5 years. Have your cholesterol levels checked more often if: Your lipid or cholesterol levels are high. You are older than 43 years of age. You are at high risk for heart disease. What should I know about cancer screening? Depending on your health history  and family history, you may need to have cancer screening at various ages. This may include screening for: Breast cancer. Cervical cancer. Colorectal cancer. Skin cancer. Lung cancer. What should I know about heart disease, diabetes, and high blood pressure? Blood pressure and heart disease High blood pressure causes heart disease and increases the risk of stroke. This is more likely to develop in people who have high blood pressure readings or are overweight. Have your blood pressure checked: Every 3-5 years if you are 68-6 years of age. Every year if you are 76 years old or older. Diabetes Have regular diabetes screenings. This checks your fasting blood sugar level. Have the screening done: Once every three years after age 54 if you are at a normal weight and have a low risk for diabetes. More often and at a younger age if you are overweight or have a high risk for diabetes. What should I know about preventing infection? Hepatitis B If you have a higher risk for hepatitis B, you should be screened for this virus. Talk with your health care provider to find out if you are at risk for hepatitis B infection. Hepatitis C Testing is recommended for: Everyone born from [birth year] through 1966. Anyone with known risk factors for hepatitis C. Sexually transmitted infections (STIs) Get screened for STIs, including gonorrhea and chlamydia, if: You are sexually active and are younger than 43 years of age. You are older than 43 years of age and your health care provider tells you that you are at risk for this type of  infection. Your sexual activity has changed since you were last screened, and you are at increased risk for chlamydia or gonorrhea. Ask your health care provider if you are at risk. Ask your health care provider about whether you are at high risk for HIV. Your health care provider may recommend a prescription medicine to help prevent HIV infection. If you choose to take medicine to prevent  HIV, you should first get tested for HIV. You should then be tested every 3 months for as long as you are taking the medicine. Pregnancy If you are about to stop having your period (premenopausal) and you may become pregnant, seek counseling before you get pregnant. Take 400 to 800 micrograms (mcg) of folic acid every day if you become pregnant. Ask for birth control (contraception) if you want to prevent pregnancy. Osteoporosis and menopause Osteoporosis is a disease in which the bones lose minerals and strength with aging. This can result in bone fractures. If you are 56 years old or older, or if you are at risk for osteoporosis and fractures, ask your health care provider if you should: Be screened for bone loss. Take a calcium or vitamin D supplement to lower your risk of fractures. Be given hormone replacement therapy (HRT) to treat symptoms of menopause. Follow these instructions at home: Alcohol use Do not drink alcohol if: Your health care provider tells you not to drink. You are pregnant, may be pregnant, or are planning to become pregnant. If you drink alcohol: Limit how much you have to: 0-1 drink a day. Know how much alcohol is in your drink. In the [place] one drink equals one 12 oz bottle of beer (355 mL), one 5 oz glass of wine (148 mL), or one 1 oz glass of hard liquor (44 mL). Lifestyle Do not use any products that contain nicotine or tobacco. These products include cigarettes, chewing tobacco, and vaping devices, such as e-cigarettes. If you need help quitting, ask your health care provider. Do not use street drugs. Do not share needles. Ask your health care provider for help if you need support or information about quitting drugs. General instructions Schedule regular health, dental, and eye exams. Stay current with your vaccines. Tell your health care provider if: You often feel depressed. You have ever been abused or do not feel safe at home. Summary Adopting a  healthy lifestyle and getting preventive care are important in promoting health and wellness. Follow your health care provider's instructions about healthy diet, exercising, and getting tested or screened for diseases. Follow your health care provider's instructions on monitoring your cholesterol and blood pressure. This information is not intended to replace advice given to you by your health care provider. Make sure you discuss any questions you have with your health care provider. Document Revised: 07/31/2020 Document Reviewed: 07/31/2020 Elsevier Patient Education  2024 [employer].

## 2023-02-14 ENCOUNTER — Encounter: Payer: Self-pay | Admitting: Internal Medicine

## 2023-02-14 ENCOUNTER — Ambulatory Visit (INDEPENDENT_AMBULATORY_CARE_PROVIDER_SITE_OTHER): Payer: Medicare Other | Admitting: Internal Medicine

## 2023-02-14 VITALS — BP 100/70 | HR 80 | Temp 98.2°F | Ht 65.0 in | Wt 187.0 lb

## 2023-02-14 DIAGNOSIS — F419 Anxiety disorder, unspecified: Secondary | ICD-10-CM

## 2023-02-14 DIAGNOSIS — Z0000 Encounter for general adult medical examination without abnormal findings: Secondary | ICD-10-CM | POA: Diagnosis not present

## 2023-02-14 DIAGNOSIS — K219 Gastro-esophageal reflux disease without esophagitis: Secondary | ICD-10-CM | POA: Diagnosis not present

## 2023-02-14 DIAGNOSIS — G901 Familial dysautonomia [Riley-Day]: Secondary | ICD-10-CM | POA: Diagnosis not present

## 2023-02-14 DIAGNOSIS — R7303 Prediabetes: Secondary | ICD-10-CM

## 2023-02-14 DIAGNOSIS — E782 Mixed hyperlipidemia: Secondary | ICD-10-CM | POA: Diagnosis not present

## 2023-02-14 DIAGNOSIS — G8929 Other chronic pain: Secondary | ICD-10-CM

## 2023-02-14 DIAGNOSIS — G43709 Chronic migraine without aura, not intractable, without status migrainosus: Secondary | ICD-10-CM

## 2023-02-14 DIAGNOSIS — D509 Iron deficiency anemia, unspecified: Secondary | ICD-10-CM | POA: Diagnosis not present

## 2023-02-14 DIAGNOSIS — F3289 Other specified depressive episodes: Secondary | ICD-10-CM

## 2023-02-14 DIAGNOSIS — M549 Dorsalgia, unspecified: Secondary | ICD-10-CM | POA: Diagnosis not present

## 2023-02-14 DIAGNOSIS — F900 Attention-deficit hyperactivity disorder, predominantly inattentive type: Secondary | ICD-10-CM

## 2023-02-14 DIAGNOSIS — G4709 Other insomnia: Secondary | ICD-10-CM

## 2023-02-14 LAB — LIPID PANEL
Cholesterol: 186 mg/dL (ref 0–200)
HDL: 39 mg/dL — ABNORMAL LOW (ref >39.00)
LDL Cholesterol: 111 mg/dL — ABNORMAL HIGH (ref 0–99)
NonHDL: 147.42
Total CHOL/HDL Ratio: 5
Triglycerides: 182 mg/dL — ABNORMAL HIGH (ref 0.0–149.0)
VLDL: 36.4 mg/dL (ref 0.0–40.0)

## 2023-02-14 LAB — COMPREHENSIVE METABOLIC PANEL
ALT: 12 U/L (ref 0–35)
AST: 20 U/L (ref 0–37)
Albumin: 4.2 g/dL (ref 3.5–5.2)
Alkaline Phosphatase: 33 U/L — ABNORMAL LOW (ref 39–117)
BUN: 7 mg/dL (ref 6–23)
CO2: 26 meq/L (ref 19–32)
Calcium: 9.2 mg/dL (ref 8.4–10.5)
Chloride: 104 meq/L (ref 96–112)
Creatinine, Ser: 0.77 mg/dL (ref 0.40–1.20)
GFR: 94.68 mL/min (ref >60.00)
Glucose, Bld: 92 mg/dL (ref 70–99)
Potassium: 4 meq/L (ref 3.5–5.1)
Sodium: 138 meq/L (ref 135–145)
Total Bilirubin: 0.2 mg/dL (ref 0.2–1.2)
Total Protein: 7.2 g/dL (ref 6.0–8.3)

## 2023-02-14 LAB — HEMOGLOBIN A1C: Hgb A1c MFr Bld: 6.5 % (ref 4.6–6.5)

## 2023-02-14 LAB — CBC WITH DIFFERENTIAL/PLATELET
Basophils Absolute: 0.1 10*3/uL (ref 0.0–0.1)
Basophils Relative: 0.9 % (ref 0.0–3.0)
Eosinophils Absolute: 0.1 10*3/uL (ref 0.0–0.7)
Eosinophils Relative: 1 % (ref 0.0–5.0)
HCT: 32.3 % — ABNORMAL LOW (ref 36.0–46.0)
Hemoglobin: 10 g/dL — ABNORMAL LOW (ref 12.0–15.0)
Lymphocytes Relative: 38.7 % (ref 12.0–46.0)
Lymphs Abs: 2.6 10*3/uL (ref 0.7–4.0)
MCHC: 30.9 g/dL (ref 30.0–36.0)
MCV: 70.8 fL — ABNORMAL LOW (ref 78.0–100.0)
Monocytes Absolute: 0.4 10*3/uL (ref 0.1–1.0)
Monocytes Relative: 6.2 % (ref 3.0–12.0)
Neutro Abs: 3.6 10*3/uL (ref 1.4–7.7)
Neutrophils Relative %: 53.2 % (ref 43.0–77.0)
Platelets: 381 10*3/uL (ref 150.0–400.0)
RBC: 4.56 Mil/uL (ref 3.87–5.11)
RDW: 16.7 % — ABNORMAL HIGH (ref 11.5–15.5)
WBC: 6.8 10*3/uL (ref 4.0–10.5)

## 2023-02-14 LAB — TSH: TSH: 1.44 u[IU]/mL (ref 0.35–5.50)

## 2023-02-14 LAB — FERRITIN: Ferritin: 2.5 ng/mL — ABNORMAL LOW (ref 10.0–291.0)

## 2023-02-14 LAB — IBC PANEL
Iron: 19 ug/dL — ABNORMAL LOW (ref 42–145)
Saturation Ratios: 2.9 % — ABNORMAL LOW (ref 20.0–50.0)
TIBC: 652.4 ug/dL — ABNORMAL HIGH (ref 250.0–450.0)
Transferrin: 466 mg/dL — ABNORMAL HIGH (ref 212.0–360.0)

## 2023-02-14 MED ORDER — METFORMIN HCL 500 MG PO TABS: 500.0000 mg | ORAL_TABLET | Freq: Two times a day (BID) | ORAL | 2 refills | Status: DC

## 2023-02-14 MED ORDER — AUVELITY 45-105 MG PO TBCR: 1.0000 | EXTENDED_RELEASE_TABLET | Freq: Every day | ORAL | 1 refills | Status: DC

## 2023-02-14 MED ORDER — HYOSCYAMINE SULFATE 0.125 MG SL SUBL: 0.1250 mg | SUBLINGUAL_TABLET | Freq: Four times a day (QID) | SUBLINGUAL | 1 refills | Status: DC | PRN

## 2023-02-14 MED ORDER — ATENOLOL 25 MG PO TABS: 25.0000 mg | ORAL_TABLET | Freq: Two times a day (BID) | ORAL | 1 refills | Status: DC

## 2023-02-14 MED ORDER — ATOMOXETINE HCL 40 MG PO CAPS: 40.0000 mg | ORAL_CAPSULE | Freq: Every day | ORAL | 1 refills | Status: DC

## 2023-02-14 MED ORDER — PREGABALIN 150 MG PO CAPS: 150.0000 mg | ORAL_CAPSULE | Freq: Two times a day (BID) | ORAL | 0 refills | Status: DC

## 2023-02-14 NOTE — Assessment & Plan Note (Addendum)
Chronic I will no longer be following with psychiatry so I will take over her prescriptions Continue Auvelity 45-105 mg daily, hydroxyzine 50-100 mg 3 times daily as needed

## 2023-02-14 NOTE — Assessment & Plan Note (Signed)
Chronic Follows with cardiology-Dr. Graciela Husbands Continue atenolol 25 mg bid-has not been able to take this recently because her blood pressure is still low Not wearing compression socks - always too hot Has not been as good with drinking fluids and has had increased symptoms because of that Has not been able to exercise regularly, but she is active caring for her daughter

## 2023-02-14 NOTE — Assessment & Plan Note (Signed)
Chronic No longer seeing psychiatry so I will for start prescribing her medication Continue Strattera 40 mg daily

## 2023-02-14 NOTE — Assessment & Plan Note (Signed)
Chronic Taking iron daily Ck cbc, iron panel

## 2023-02-14 NOTE — Assessment & Plan Note (Signed)
Chronic Regular exercise and healthy diet encouraged Check lipid panel, cmp Continue atorvastatin 10 mg daily, fenofibrate 145 mg daily

## 2023-02-14 NOTE — Assessment & Plan Note (Signed)
Chronic Continue Lyrica 150 mg twice daily, tizanidine 4 mg 3 times daily as needed for baclofen 10 mg 3 times daily-does not take both at the same time

## 2023-02-14 NOTE — Assessment & Plan Note (Signed)
Chronic No longer seeing psychiatrist and I will prescribe her medications Continue Auvelity 45-105 mg daily

## 2023-02-14 NOTE — Assessment & Plan Note (Signed)
Chronic GERD controlled Continue omeprazole 40 mg daily

## 2023-02-14 NOTE — Assessment & Plan Note (Addendum)
Chronic Management per the headache center Getting botox Taking Maxalt 10 mg prn

## 2023-02-14 NOTE — Assessment & Plan Note (Signed)
Chronic Lab Results  Component Value Date   HGBA1C 6.4 07/16/2022   Check a1c Low sugar / carb diet Stressed regular exercise Continue metformin 500 mg twice daily

## 2023-03-03 NOTE — Telephone Encounter (Signed)
Received approval via pharmacy benefit. Medical benefit approval is on file through 12/03/2023.  Auth#: [id number] (03/03/23-06/01/23)

## 2023-03-03 NOTE — Telephone Encounter (Signed)
Completed pharmacy auth renewal via cmm, status is pending. Key: [id number]

## 2023-03-20 ENCOUNTER — Telehealth: Payer: Self-pay | Admitting: Adult Health

## 2023-03-20 ENCOUNTER — Encounter: Payer: Self-pay | Admitting: Internal Medicine

## 2023-03-20 NOTE — Telephone Encounter (Signed)
[facility] Kayla Floyd) scheduling delivery of Botox. Would like a call back from the nurse to ask clinical questions before scheduling delivery.

## 2023-03-20 NOTE — Telephone Encounter (Signed)
Called the pharmacy back. Made aware of the patient upcoming apt that is scheduled for 04/08/2023. The pharmacist called the patient while on the phone to schedule delivery of the medication. There was no answer by the pt. Will send a mychart message as well.

## 2023-03-24 ENCOUNTER — Other Ambulatory Visit: Payer: Self-pay | Admitting: Internal Medicine

## 2023-03-24 DIAGNOSIS — F3341 Major depressive disorder, recurrent, in partial remission: Secondary | ICD-10-CM

## 2023-03-24 MED ORDER — AUVELITY 45-105 MG PO TBCR: 1.0000 | EXTENDED_RELEASE_TABLET | Freq: Two times a day (BID) | ORAL | 0 refills | Status: DC

## 2023-03-24 NOTE — Telephone Encounter (Signed)
I called [facility], delivery scheduled for 03/31/23.

## 2023-04-08 ENCOUNTER — Encounter: Payer: Self-pay | Admitting: Internal Medicine

## 2023-04-08 ENCOUNTER — Ambulatory Visit: Payer: Medicare Other | Admitting: Adult Health

## 2023-04-08 DIAGNOSIS — G43709 Chronic migraine without aura, not intractable, without status migrainosus: Secondary | ICD-10-CM

## 2023-04-08 DIAGNOSIS — G8929 Other chronic pain: Secondary | ICD-10-CM

## 2023-04-08 MED ORDER — RIZATRIPTAN BENZOATE 10 MG PO TABS: 10.0000 mg | ORAL_TABLET | ORAL | 11 refills | Status: DC | PRN

## 2023-04-08 MED ORDER — BACLOFEN 10 MG PO TABS: ORAL_TABLET | ORAL | 1 refills | Status: DC

## 2023-04-08 MED ORDER — TIZANIDINE HCL 4 MG PO TABS: ORAL_TABLET | ORAL | 1 refills | Status: DC

## 2023-04-08 MED ORDER — ONABOTULINUMTOXINA 200 UNITS IJ SOLR
155.0000 [IU] | Freq: Once | INTRAMUSCULAR | Status: AC
  Administered 2023-04-08: 165 [IU] via INTRAMUSCULAR

## 2023-04-08 NOTE — Progress Notes (Signed)
 Botox- 200 units x 1 vial Lot: [id number] Expiration:06/2025 NDC: 0023-3921-02  Bacteriostatic 0.9% Sodium Chloride- * mL  Lot: [id number] Expiration: 01/24/2024 NDC: 5621-3086-57  Dx:G43.709 S/P Witnessed by: Hermenia Fiscal, RN

## 2023-04-08 NOTE — Progress Notes (Signed)
 Returns for repeat Botox , prior injection 01/01/2023 (previously receiving Botox  through [facility] neurology in [place]).  Reports continued benefit with Botox  injections.  Has had some increased headaches more recently due to fluctuation of blood pressure, she is closely being followed by PCP and cardiology.  Continues to have jaw pain with clenching and grinding which can worsen headaches.  Use of rizatriptan  with benefit.  Tolerated procedure well today.  Return in 3 months for repeat injection.      Consent Form Botulism Toxin Injection For Chronic Migraine    Reviewed orally with patient, additionally signature is on file:  Botulism toxin has been approved by the Federal drug administration for treatment of chronic migraine. Botulism toxin does not cure chronic migraine and it may not be effective in some patients.  The administration of botulism toxin is accomplished by injecting a small amount of toxin into the muscles of the neck and head. Dosage must be titrated for each individual. Any benefits resulting from botulism toxin tend to wear off after 3 months with a repeat injection required if benefit is to be maintained. Injections are usually done every 3-4 months with maximum effect peak achieved by about 2 or 3 weeks. Botulism toxin is expensive and you should be sure of what costs you will incur resulting from the injection.  The side effects of botulism toxin use for chronic migraine may include:   -Transient, and usually mild, facial weakness with facial injections  -Transient, and usually mild, head or neck weakness with head/neck injections  -Reduction or loss of forehead facial animation due to forehead muscle weakness  -Eyelid drooping  -Dry eye  -Pain at the site of injection or bruising at the site of injection  -Double vision  -Potential unknown long term risks   Contraindications: You should not have Botox  if you are pregnant, nursing, allergic to albumin,  have an infection, skin condition, or muscle weakness at the site of the injection, or have myasthenia gravis, Lambert-Eaton syndrome, or ALS.  It is also possible that as with any injection, there may be an allergic reaction or no effect from the medication. Reduced effectiveness after repeated injections is sometimes seen and rarely infection at the injection site may occur. All care will be taken to prevent these side effects. If therapy is given over a long time, atrophy and wasting in the muscle injected may occur. Occasionally the patient's become refractory to treatment because they develop antibodies to the toxin. In this event, therapy needs to be modified.  I have read the above information and consent to the administration of botulism toxin.    BOTOX  PROCEDURE NOTE FOR MIGRAINE HEADACHE  Contraindications and precautions discussed with patient(above). Aseptic procedure was observed and patient tolerated procedure. Procedure performed by Harlene Bogaert, AGNP-BC.   The condition has existed for more than 6 months, and pt does not have a diagnosis of ALS, Myasthenia Gravis or Lambert-Eaton Syndrome.  Risks and benefits of injections discussed and pt agrees to proceed with the procedure.  Written consent obtained  These injections are medically necessary. Pt  receives good benefits from these injections. These injections do not cause sedations or hallucinations which the oral therapies may cause.   Description of procedure:  The patient was placed in a sitting position. The standard protocol was used for Botox  as follows, with 5 units of Botox  injected at each site:  -Procerus muscle, midline injection  -Corrugator muscle, bilateral injection  -Frontalis muscle, bilateral injection, with 2  sites each side, medial injection was performed in the upper one third of the frontalis muscle, in the region vertical from the medial inferior edge of the superior orbital rim. The lateral injection  was again in the upper one third of the forehead vertically above the lateral limbus of the cornea, 1.5 cm lateral to the medial injection site.  -Temporalis muscle injection, 4 sites, bilaterally. The first injection was 3 cm above the tragus of the ear, second injection site was 1.5 cm to 3 cm up from the first injection site in line with the tragus of the ear. The third injection site was 1.5-3 cm forward between the first 2 injection sites. The fourth injection site was 1.5 cm posterior to the second injection site. 5th site laterally in the temporalis  muscleat the level of the outer canthus.  -Occipitalis muscle injection, 3 sites, bilaterally. The first injection was done one half way between the occipital protuberance and the tip of the mastoid process behind the ear. The second injection site was done lateral and superior to the first, 1 fingerbreadth from the first injection. The third injection site was 1 fingerbreadth superiorly and medially from the first injection site.  -Cervical paraspinal muscle injection, 2 sites, bilaterally. The first injection site was 1 cm from the midline of the cervical spine, 3 cm inferior to the lower border of the occipital protuberance. The second injection site was 1.5 cm superiorly and laterally to the first injection site.  -Trapezius muscle injection was performed at 3 sites, bilaterally. The first injection site was in the upper trapezius muscle halfway between the inflection point of the neck, and the acromion. The second injection site was one half way between the acromion and the first injection site. The third injection was done between the first injection site and the inflection point of the neck.  -Masseter muscle injection was performed at 1 site, bilaterally.     A total of 200 units of Botox  was prepared, 165 units of Botox  was injected as documented above, any Botox  not injected was wasted. The patient tolerated the procedure well, there were  no complications of the above procedure.   Harlene Bogaert, AGNP-BC  [facility] [address] [place], [place] [address]  Phone [phone] Fax [phone] Note: This document was prepared with digital dictation and possible smart phrase technology. Any transcriptional errors that result from this process are unintentional.

## 2023-04-20 ENCOUNTER — Other Ambulatory Visit: Payer: Self-pay | Admitting: Radiology

## 2023-04-20 DIAGNOSIS — N390 Urinary tract infection, site not specified: Secondary | ICD-10-CM

## 2023-04-21 ENCOUNTER — Other Ambulatory Visit: Payer: Self-pay | Admitting: Nurse Practitioner

## 2023-04-21 DIAGNOSIS — A609 Anogenital herpesviral infection, unspecified: Secondary | ICD-10-CM

## 2023-04-21 NOTE — Telephone Encounter (Signed)
Med refill request: Macrobid Last AEX: 05/16/22 Next AEX: not scheduled Last MMG (if hormonal med) 08/20/22 Refill authorized: Please Advise, #90, 0 RF, asked front to schedule for AEX

## 2023-04-21 NOTE — Telephone Encounter (Signed)
Med refill request: Valtrex Last AEX: 05/16/22 Next AEX: not scheduled Last MMG (if hormonal med) 08/20/22 Refill authorized: Please Advise, #90, 0 RF, asked front to schedule for AEX

## 2023-05-08 ENCOUNTER — Other Ambulatory Visit: Payer: Self-pay | Admitting: Internal Medicine

## 2023-05-17 ENCOUNTER — Other Ambulatory Visit: Payer: Self-pay | Admitting: Nurse Practitioner

## 2023-05-17 DIAGNOSIS — Z3041 Encounter for surveillance of contraceptive pills: Secondary | ICD-10-CM

## 2023-05-19 NOTE — Telephone Encounter (Signed)
 Med refill request: BCPs Last AEX: 05/16/2022-TW Next AEX: 06/26/2023-TW Last MMG (if hormonal med): 08/20/2022-WNL Refill authorized: rx pend.

## 2023-05-20 ENCOUNTER — Other Ambulatory Visit: Payer: Self-pay | Admitting: Internal Medicine

## 2023-05-20 DIAGNOSIS — G8929 Other chronic pain: Secondary | ICD-10-CM

## 2023-05-21 NOTE — Telephone Encounter (Signed)
 Please advise, see below.  LOV: 02/14/2023 NOV: 08/15/2023 Last rx refill: 04/08/2023 #60 with 1 refill.

## 2023-05-26 ENCOUNTER — Other Ambulatory Visit: Payer: Self-pay | Admitting: Internal Medicine

## 2023-05-31 ENCOUNTER — Other Ambulatory Visit: Payer: Self-pay | Admitting: Internal Medicine

## 2023-06-10 ENCOUNTER — Telehealth: Payer: Self-pay | Admitting: Adult Health

## 2023-06-10 DIAGNOSIS — G43709 Chronic migraine without aura, not intractable, without status migrainosus: Secondary | ICD-10-CM

## 2023-06-10 NOTE — Telephone Encounter (Signed)
 Received fax of approval, pt will continue to fill through [facility].  Auth#: [id number] (06/10/23-09/10/23)

## 2023-06-10 NOTE — Telephone Encounter (Signed)
 Submitted auth renewal via CMM, status is pending. Key: [id number]

## 2023-06-16 ENCOUNTER — Other Ambulatory Visit: Payer: Self-pay | Admitting: Internal Medicine

## 2023-06-16 DIAGNOSIS — F3341 Major depressive disorder, recurrent, in partial remission: Secondary | ICD-10-CM

## 2023-06-25 ENCOUNTER — Ambulatory Visit (INDEPENDENT_AMBULATORY_CARE_PROVIDER_SITE_OTHER): Payer: Medicare Other

## 2023-06-25 VITALS — Ht 65.0 in | Wt 185.0 lb

## 2023-06-25 DIAGNOSIS — Z0000 Encounter for general adult medical examination without abnormal findings: Secondary | ICD-10-CM | POA: Diagnosis not present

## 2023-06-25 NOTE — Patient Instructions (Signed)
 Kayla Floyd , Thank you for taking time to come for your Medicare Wellness Visit. I appreciate your ongoing commitment to your health goals. Please review the following plan we discussed and let me know if I can assist you in the future.   Referrals/Orders/Follow-Ups/Clinician Recommendations: Aim for 30 minutes of exercise or brisk walking, 6-8 glasses of water, and 5 servings of fruits and vegetables each day.   This is a list of the screening recommended for you and due dates:  Health Maintenance  Topic Date Due   Pneumococcal Vaccination (1 of 2 - PCV) Never done   COVID-19 Vaccine (4 - 2024-25 season) 11/24/2022   Flu Shot  10/24/2023   Medicare Annual Wellness Visit  06/24/2024   Pap with HPV screening  05/17/2027   DTaP/Tdap/Td vaccine (10 - Td or Tdap) 09/09/2031   Hepatitis C Screening  Completed   HIV Screening  Completed   HPV Vaccine  Aged Out    Advanced directives: (Provided) Advance directive discussed with you today. I have provided a copy for you to complete at home and have notarized. Once this is complete, please bring a copy in to our office so we can scan it into your chart.   Next Medicare Annual Wellness Visit scheduled for next year: Yes

## 2023-06-25 NOTE — Progress Notes (Signed)
 Subjective:   Kayla Floyd is a 43 y.o. who presents for a Medicare Wellness preventive visit.  Visit Complete: Virtual I connected with  Kayla Floyd on 06/25/23 by a audio enabled telemedicine application and verified that I am speaking with the correct person using two identifiers.  Patient Location: Home  Provider Location: Office/Clinic  I discussed the limitations of evaluation and management by telemedicine. The patient expressed understanding and agreed to proceed.  Vital Signs: Because this visit was a virtual/telehealth visit, some criteria may be missing or patient reported. Any vitals not documented were not able to be obtained and vitals that have been documented are patient reported.  VideoDeclined- This patient declined Librarian, academic. Therefore the visit was completed with audio only.  Persons Participating in Visit: Patient.  AWV Questionnaire: No: Patient Medicare AWV questionnaire was not completed prior to this visit.  Cardiac Risk Factors include: advanced age (>22men, >70 women);dyslipidemia;obesity (BMI >30kg/m2)     Objective:    Today's Vitals   06/25/23 1525  Weight: 185 lb (83.9 kg)  Height: 5\' 5"  (1.651 m)  PainSc: 5   PainLoc: Back   Body mass index is 30.79 kg/m.     06/25/2023    3:22 PM 09/28/2022   11:57 PM 07/08/2022    3:08 PM 07/02/2021    3:35 PM 06/15/2021    1:06 PM 10/18/2020    9:40 PM 10/17/2020    2:46 PM  Advanced Directives  Does Patient Have a Medical Advance Directive? No No Yes No No No No  Type of Surveyor, minerals;Living will      Copy of Healthcare Power of Attorney in Chart?   No - copy requested      Would patient like information on creating a medical advance directive? Yes (MAU/Ambulatory/Procedural Areas - Information given) No - Patient declined  No - Patient declined No - Patient declined No - Patient declined     Current Medications  (verified) Outpatient Encounter Medications as of 06/25/2023  Medication Sig   acetaminophen (TYLENOL) 325 MG tablet Take 2 tablets (650 mg total) by mouth every 6 (six) hours as needed (pain).   atenolol (TENORMIN) 25 MG tablet Take 1 tablet (25 mg total) by mouth 2 (two) times daily.   atomoxetine (STRATTERA) 40 MG capsule Take 1 capsule (40 mg total) by mouth daily.   atorvastatin (LIPITOR) 10 MG tablet TAKE 1 TABLET BY MOUTH EVERY DAY   AUROVELA 1/20 1-20 MG-MCG tablet TAKE 1 TABLET BY MOUTH DAILY. TAKE ACTIVE PILLS ONLY, SKIPPING PLACEBOS   AUVELITY 45-105 MG TBCR TAKE 1 TABLET BY MOUTH IN THE MORNING AND IN THE EVENING   b complex vitamins tablet Take 1 tablet by mouth daily.   baclofen (LIORESAL) 10 MG tablet TAKE 1 TABLET BY MOUTH THREE TIMES A DAY AS NEEDED FOR MUSCLE SPASM   botulinum toxin Type A (BOTOX) 200 units injection Inject 155 units into the muscles of head, and neck every 3 months discard remainder   cetirizine (ZYRTEC) 10 MG tablet Take 10 mg by mouth daily. Reported on 07/13/2015   EPINEPHrine 0.3 mg/0.3 mL IJ SOAJ injection Inject 0.3 mg into the muscle as needed for anaphylaxis.   fenofibrate (TRICOR) 145 MG tablet TAKE 1 TABLET (145MG  TOTAL) BY MOUTH EVERY DAY.   hydrOXYzine (ATARAX) 50 MG tablet TAKE 1 - 2 TABLETS BY MOUTH ONCE DAILY AS NEEDED FOR ANXIETY/SLEEP   hyoscyamine (LEVSIN SL) 0.125 MG  SL tablet Take 1 tablet (0.125 mg total) by mouth every 6 (six) hours as needed. PLACE 1 TABLET UNDER THE TONGUE EVERY 6 (SIX) HOURS AS NEEDED.   metFORMIN (GLUCOPHAGE) 500 MG tablet Take 1 tablet (500 mg total) by mouth 2 (two) times daily with a meal.   Multiple Vitamin (MULTIVITAMIN) tablet Take 1 tablet by mouth daily.     nitrofurantoin, macrocrystal-monohydrate, (MACROBID) 100 MG capsule TAKE 1 CAPSULE BY MOUTH AS NEEDED WITH INTERCOURSE   omeprazole (PRILOSEC) 40 MG capsule TAKE 1 CAPSULE BY MOUTH EVERY DAY   ondansetron (ZOFRAN) 8 MG tablet Take 1 tablet (8 mg total) by  mouth 2 (two) times daily as needed for nausea or vomiting.   pregabalin (LYRICA) 150 MG capsule Take 1 capsule (150 mg total) by mouth 2 (two) times daily.   Probiotic Product (PROBIOTIC-10 PO) Take 1 capsule by mouth daily.   promethazine (PHENERGAN) 12.5 MG tablet Take by mouth daily as needed.   rizatriptan (MAXALT) 10 MG tablet Take 1 tablet (10 mg total) by mouth as needed for migraine. May repeat in 2 hours if needed   tiZANidine (ZANAFLEX) 4 MG tablet TAKE 1 TABLET BY MOUTH EVERY 8 HOURS AS NEEDED FOR MUSCLE SPASM   valACYclovir (VALTREX) 1000 MG tablet TAKE 1 TABLET BY MOUTH EVERY DAY   No facility-administered encounter medications on file as of 06/25/2023.    Allergies (verified) Alpha-gal; Beef-derived drug products; Glycerol, iodinated; Milk-related compounds; and Pork-derived products   History: Past Medical History:  Diagnosis Date   ANXIETY    BACK PAIN, CHRONIC    DYSAUTONOMIA 2011   "POTS" , recurrent syncope - follows with cards for same   GERD    HSV (herpes simplex virus) anogenital infection 06/2015   cervix swab   HYPERLIPIDEMIA    Kidney failure    MIGRAINE HEADACHE    Palpitations    POLYCYSTIC OVARIAN DISEASE    POTS (postural orthostatic tachycardia syndrome)    Recurrent upper respiratory infection (URI)    Tear meniscus knee 06/14/2010   right   Past Surgical History:  Procedure Laterality Date   Cervical spine  2010   ACDF c5-6   crevical fusion   2010   evs     LUMBAR LAMINECTOMY  2008   TONSILLECTOMY AND ADENOIDECTOMY     Family History  Problem Relation Age of Onset   Bipolar disorder Mother    Stroke Mother        Age of 75 while on OCP   Other Father        MVA   Heart disease Father    Heart attack Father    Colon cancer Maternal Grandmother 34   Cancer Maternal Grandmother    Hypertension Sister    Hypertension Sister    Migraines Sister    Breast cancer Paternal Grandmother        dx in her 96s-40s   Cancer Paternal Uncle         ocular   Breast cancer Paternal Aunt 22   Lung cancer Paternal Uncle    Social History   Socioeconomic History   Marital status: Married    Spouse name: Not on file   Number of children: Not on file   Years of education: Not on file   Highest education level: Associate degree: occupational, Scientist, product/process development, or vocational program  Occupational History   Occupation: part-time Art gallery manager  Tobacco Use   Smoking status: Former    Current packs/day: 0.00  Average packs/day: 0.1 packs/day for 10.0 years (1.0 ttl pk-yrs)    Types: Cigarettes    Quit date: 2024    Years since quitting: 1.2    Passive exposure: Past   Smokeless tobacco: Never   Tobacco comments:    3-4 cigarettes per day  Vaping Use   Vaping status: Never Used  Substance and Sexual Activity   Alcohol use: Not Currently   Drug use: No   Sexual activity: Yes    Partners: Male    Comment: 1st intercourse 43 yo-More than 5 partners, married- 1 yr  Other Topics Concern   Not on file  Social History Narrative   Disability since 10/2012 due to POTS, 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[employer]      Some exercise on a regular exericse         Right handed    Wear glasses   Drinks coffee daily   Drinks soda occ    Social Drivers of Health   Financial Resource Strain: Low Risk  (06/25/2023)   Overall Financial Resource Strain (CARDIA)    Difficulty of Paying Living Expenses: Not hard at all  Food Insecurity: No Food Insecurity (06/25/2023)   Hunger Vital Sign    Worried About Running Out of Food in the Last Year: Never true    Ran Out of Food in the Last Year: Never true  Transportation Needs: No Transportation Needs (06/25/2023)   PRAPARE - Administrator, Civil Service (Medical): No    Lack of Transportation (Non-Medical): No  Physical Activity: Inactive (06/25/2023)   Exercise Vital Sign    Days of Exercise per Week: 0 days    Minutes of Exercise per Session: 0 min  Stress: Stress Concern Present (06/25/2023)    [employer] of Occupational Health - Occupational Stress Questionnaire    Feeling of Stress : Very much  Social Connections: Moderately Integrated (06/25/2023)   Social Connection and Isolation Panel [NHANES]    Frequency of Communication with Friends and Family: More than three times a week    Frequency of Social Gatherings with Friends and Family: Never    Attends Religious Services: Never    Diplomatic Services operational officer: No    Attends Engineer, structural: 1 to 4 times per year    Marital Status: Married    Tobacco Counseling Counseling given: No Tobacco comments: 3-4 cigarettes per day    Clinical Intake:  Pre-visit preparation completed: Yes  Pain : 0-10 Pain Score: 5  Pain Type: Chronic pain Pain Location: Back Pain Orientation: Lower Pain Descriptors / Indicators: Constant Pain Onset: More than a month ago Pain Relieving Factors: medication Effect of Pain on Daily Activities: heat pads; Chiropractor exams 1x a month  Pain Relieving Factors: medication  BMI - recorded: 30.79 Nutritional Status: BMI > 30  Obese Nutritional Risks: None Diabetes: No  Lab Results  Component Value Date   HGBA1C 6.5 02/14/2023   HGBA1C 6.4 07/16/2022   HGBA1C 6.3 01/15/2022     How often do you need to have someone help you when you read instructions, pamphlets, or other written materials from your doctor or pharmacy?: 1 - Never  Interpreter Needed?: No  Information entered by :: Hassell Halim, CMA   Activities of Daily Living     06/25/2023    3:31 PM 07/08/2022    3:09 PM  In your present state of health, do you have any difficulty performing the following activities:  Hearing? 0  0  Vision? 0 0  Difficulty concentrating or making decisions? 0 0  Walking or climbing stairs? 0 0  Dressing or bathing? 0 0  Doing errands, shopping? 0 0  Preparing Food and eating ? N N  Using the Toilet? N N  In the past six months, have you accidently leaked urine?  N N  Do you have problems with loss of bowel control? N N  Managing your Medications? N N  Managing your Finances? N N  Housekeeping or managing your Housekeeping? N N    Patient Care Team: Pincus Sanes, MD as PCP - General (Internal Medicine) Rollene Rotunda, MD (Cardiology) Teryl Lucy, MD (Orthopedic Surgery) Irene Limbo, FNP (Nurse Practitioner) Kathyrn Sheriff, [facility] (Inactive) as Pharmacist (Pharmacist) Olivia Mackie, NP as Nurse Practitioner (Gynecology)  Indicate any recent Medical Services you may have received from other than [facility] providers in the past year (date may be approximate).     Assessment:   This is a routine wellness examination for [place]e.  Hearing/Vision screen Hearing Screening - Comments:: Denies hearing difficulties   Vision Screening - Comments:: Wears rx glasses - up to date with routine eye exams (per pt last exam in 2024).   Goals Addressed               This Visit's Progress     Patient Stated (pt-stated)        Patient stated she wants to balance her medical conditions and stay active.       Depression Screen     06/25/2023    3:37 PM 02/14/2023    4:16 PM 07/16/2022    3:46 PM 07/08/2022    3:07 PM 07/02/2021    3:43 PM 06/21/2020    3:20 PM 01/12/2019   10:37 AM  PHQ 2/9 Scores  PHQ - 2 Score 2 2 2  0 0 0 1  PHQ- 9 Score 9 6 12         Fall Risk     06/25/2023    3:32 PM 02/14/2023    4:16 PM 07/16/2022    3:46 PM 07/08/2022    3:05 PM 07/04/2022    8:37 AM  Fall Risk   Falls in the past year? 0 0 0 0 0  Number falls in past yr: 0 0 0 0 0  Injury with Fall? 0 0 0 0 0  Risk for fall due to : No Fall Risks No Fall Risks No Fall Risks No Fall Risks   Follow up Falls prevention discussed;Falls evaluation completed Falls evaluation completed Falls evaluation completed Falls prevention discussed     MEDICARE RISK AT HOME:  Medicare Risk at Home Any stairs in or around the home?: No If so, are there any  without handrails?: No Home free of loose throw rugs in walkways, pet beds, electrical cords, etc?: Yes Adequate lighting in your home to reduce risk of falls?: Yes Life alert?: No Use of a cane, walker or w/c?: No Grab bars in the bathroom?: No Shower chair or bench in shower?: No Elevated toilet seat or a handicapped toilet?: No  TIMED UP AND GO:  Was the test performed?  No  Cognitive Function: 6CIT completed        06/25/2023    3:33 PM 07/08/2022    3:09 PM 07/02/2021    3:46 PM  6CIT Screen  What Year? 0 points 0 points 0 points  What month? 0 points 0 points 0 points  What time?  0 points 0 points 0 points  Count back from 20 0 points 0 points 0 points  Months in reverse 0 points 0 points 0 points  Repeat phrase 2 points 0 points 0 points  Total Score 2 points 0 points 0 points    Immunizations Immunization History  Administered Date(s) Administered   DTP 03/31/1980, 05/02/1980, 06/07/1980, 07/05/1981, 11/09/1984   Influenza Whole 12/23/2008   Influenza,inj,Quad PF,6+ Mos 12/23/2017   MMR 04/25/1981, 08/26/2003   Moderna Sars-Covid-2 Vaccination 07/06/2019, 08/03/2019   OPV 03/31/1980, 06/07/1980, 08/09/1980, 07/05/1981, 11/09/1984   Pfizer Covid-19 Vaccine Bivalent Booster 37yrs & up 09/08/2021   Td 11/11/1994, 03/25/2008   Tdap 12/23/2017, 09/08/2021    Screening Tests Health Maintenance  Topic Date Due   Pneumococcal Vaccine 72-84 Years old (1 of 2 - PCV) Never done   COVID-19 Vaccine (4 - 2024-25 season) 11/24/2022   INFLUENZA VACCINE  10/24/2023   Medicare Annual Wellness (AWV)  06/24/2024   Cervical Cancer Screening (HPV/Pap Cotest)  05/17/2027   DTaP/Tdap/Td (10 - Td or Tdap) 09/09/2031   Hepatitis C Screening  Completed   HIV Screening  Completed   HPV VACCINES  Aged Out    Health Maintenance  Health Maintenance Due  Topic Date Due   Pneumococcal Vaccine 46-31 Years old (1 of 2 - PCV) Never done   COVID-19 Vaccine (4 - 2024-25 season)  11/24/2022   Health Maintenance Items Addressed: 06/25/2023  Pap smear status: Pt stated has an appt w/Tiffany Earlene Plater, NP on 06/26/2023.  Additional Screening:  Vision Screening: Recommended annual ophthalmology exams for early detection of glaucoma and other disorders of the eye.  Dental Screening: Recommended annual dental exams for proper oral hygiene  Community Resource Referral / Chronic Care Management: CRR required this visit?  No   CCM required this visit?  No     Plan:     I have personally reviewed and noted the following in the patient's chart:   Medical and social history Use of alcohol, tobacco or illicit drugs  Current medications and supplements including opioid prescriptions. Patient is not currently taking opioid prescriptions. Functional ability and status Nutritional status Physical activity Advanced directives List of other physicians Hospitalizations, surgeries, and ER visits in previous 12 months Vitals Screenings to include cognitive, depression, and falls Referrals and appointments  In addition, I have reviewed and discussed with patient certain preventive protocols, quality metrics, and best practice recommendations. A written personalized care plan for preventive services as well as general preventive health recommendations were provided to patient.     Darreld Mclean, CMA   06/25/2023   After Visit Summary: (MyChart) Due to this being a telephonic visit, the after visit summary with patients personalized plan was offered to patient via MyChart   Notes: Nothing significant to report at this time.

## 2023-06-26 ENCOUNTER — Other Ambulatory Visit (HOSPITAL_COMMUNITY)
Admission: RE | Admit: 2023-06-26 | Discharge: 2023-06-26 | Disposition: A | Discharge status: Home / Self Care | Source: Ambulatory Visit | Attending: Nurse Practitioner | Admitting: Nurse Practitioner

## 2023-06-26 ENCOUNTER — Encounter: Payer: Self-pay | Admitting: Nurse Practitioner

## 2023-06-26 ENCOUNTER — Ambulatory Visit (INDEPENDENT_AMBULATORY_CARE_PROVIDER_SITE_OTHER): Payer: Medicare Other | Admitting: Nurse Practitioner

## 2023-06-26 VITALS — BP 118/82 | HR 87 | Ht 65.0 in | Wt 183.0 lb

## 2023-06-26 DIAGNOSIS — Z1151 Encounter for screening for human papillomavirus (HPV): Secondary | ICD-10-CM | POA: Diagnosis not present

## 2023-06-26 DIAGNOSIS — Z3041 Encounter for surveillance of contraceptive pills: Secondary | ICD-10-CM

## 2023-06-26 DIAGNOSIS — Z01419 Encounter for gynecological examination (general) (routine) without abnormal findings: Secondary | ICD-10-CM | POA: Diagnosis present

## 2023-06-26 DIAGNOSIS — N390 Urinary tract infection, site not specified: Secondary | ICD-10-CM

## 2023-06-26 DIAGNOSIS — Z124 Encounter for screening for malignant neoplasm of cervix: Secondary | ICD-10-CM | POA: Insufficient documentation

## 2023-06-26 DIAGNOSIS — A609 Anogenital herpesviral infection, unspecified: Secondary | ICD-10-CM | POA: Diagnosis not present

## 2023-06-26 DIAGNOSIS — Z9189 Other specified personal risk factors, not elsewhere classified: Secondary | ICD-10-CM

## 2023-06-26 MED ORDER — NORETHINDRONE ACET-ETHINYL EST 1-20 MG-MCG PO TABS: 1.0000 | ORAL_TABLET | Freq: Every day | ORAL | 3 refills | Status: AC

## 2023-06-26 MED ORDER — NITROFURANTOIN MONOHYD MACRO 100 MG PO CAPS
100.0000 mg | ORAL_CAPSULE | ORAL | 1 refills | Status: AC | PRN
Start: 2023-06-26 — End: ?

## 2023-06-26 MED ORDER — VALACYCLOVIR HCL 1 G PO TABS: 1000.0000 mg | ORAL_TABLET | Freq: Every day | ORAL | 3 refills | Status: DC

## 2023-06-26 NOTE — Progress Notes (Signed)
 AMILA CALLIES 1980/08/17 [id number]   History:  43 y.o. G0 presents for breast and pelvic exam. COCs continuously. Had little girl by surrogacy in 2023. Normal pap history. History of recurrent postcoital UTIs, takes macrodantin prophylactically with intercourse.  HLD, HTN, migraines, POTS managed by PCP. HSV, occasional outbreaks, takes Valtrex daily. Occasional smoker. Negative genetic testing.   Gynecologic History No LMP recorded. (Menstrual status: Oral contraceptives).   Contraception/Family planning: OCP (estrogen/progesterone) Sexually active: Yes  Health Maintenance Last Pap: 05/16/2022. Results were: ASCUS neg HPV Last mammogram: 08/20/2022. Results were: Normal Last colonoscopy: Never Last Dexa: Not indicated  Past medical history, past surgical history, family history and social history were all reviewed and documented in the EPIC chart. Married. 20 mo daughter, Langston Reusing. SAHM.   ROS:  A ROS was performed and pertinent positives and negatives are included.  Exam:  Vitals:   06/26/23 1503  BP: 118/82  Pulse: 87  SpO2: 99%  Weight: 183 lb (83 kg)  Height: 5\' 5"  (1.651 m)    Body mass index is 30.45 kg/m.  General appearance:  Normal Thyroid:  Symmetrical, normal in size, without palpable masses or nodularity. Respiratory  Auscultation:  Clear without wheezing or rhonchi Cardiovascular  Auscultation:  Regular rate, without rubs, murmurs or gallops  Edema/varicosities:  Not grossly evident Abdominal  Soft,nontender, without masses, guarding or rebound.  Liver/spleen:  No organomegaly noted  Hernia:  None appreciated  Skin  Inspection:  Grossly normal Breasts: Examined lying and sitting.   Right: Without masses, retractions, nipple discharge or axillary adenopathy.   Left: Without masses, retractions, nipple discharge or axillary adenopathy. Pelvic: External genitalia:  no lesions              Urethra:  normal appearing urethra with no masses,  tenderness or lesions              Bartholins and Skenes: normal                 Vagina: normal appearing vagina with normal color and discharge, no lesions              Cervix: no lesions Bimanual Exam:  Uterus:  no masses or tenderness              Adnexa: no mass, fullness, tenderness              Rectovaginal: Deferred              Anus:  normal, no lesions  Patient informed chaperone available to be present for breast and pelvic exam. Patient has requested no chaperone to be present. Patient has been advised what will be completed during breast and pelvic exam.   Assessment/Plan:  43 y.o. G0 for breast and pelvic exam.   Encounter for breast and pelvic examination - Education provided on SBEs, importance of preventative screenings, current guidelines, high calcium diet, regular exercise, and multivitamin daily. Labs with PCP.   Cervical cancer screening - Plan: Cytology - PAP( [place]). 04/2021 pap LGSIL, 04/2022 ASCUS neg HPV. Pap today per guidelines.   HSV (herpes simplex virus) anogenital infection - Plan: valACYclovir (VALTREX) 1000 MG tablet daily. Occasional outbreaks.   Postcoital UTI - Plan: nitrofurantoin, macrocrystal-monohydrate, (MACROBID) 100 MG capsule as needed with intercourse.   Encounter for surveillance of contraceptive pills - Plan: norethindrone-ethinyl estradiol (JUNEL 1/20) 1-20 MG-MCG tablet daily. Takes continuously for menstrual suppression. Smokes cigarettes occasionally. Discussed risk of COCs and smoking. She reports it is  rare and she wants to continue. Refill x 1 year provided.   HSV (herpes simplex virus) anogenital infection - Plan: valACYclovir (VALTREX) 1000 MG tablet daily. Occasional outbreaks.  Recurrent UTI - Plan: nitrofurantoin, macrocrystal-monohydrate, (MACROBID) 100 MG capsule after intercourse.  Screening for breast cancer - Normal mammogram history.  Continue annual screenings.  Normal breast exam today.  Return in about 1 year  (around 06/25/2024) for Annual.    Olivia Mackie DNP, 3:28 PM 06/26/2023

## 2023-07-03 ENCOUNTER — Encounter: Payer: Self-pay | Admitting: Nurse Practitioner

## 2023-07-03 ENCOUNTER — Ambulatory Visit: Payer: Medicare Other | Admitting: Adult Health

## 2023-07-03 LAB — CYTOLOGY - PAP
Comment: NEGATIVE
Diagnosis: NEGATIVE
High risk HPV: NEGATIVE

## 2023-07-10 ENCOUNTER — Ambulatory Visit (INDEPENDENT_AMBULATORY_CARE_PROVIDER_SITE_OTHER): Payer: Medicare Other | Admitting: Adult Health

## 2023-07-10 VITALS — BP 137/90 | HR 123

## 2023-07-10 DIAGNOSIS — G43709 Chronic migraine without aura, not intractable, without status migrainosus: Secondary | ICD-10-CM | POA: Diagnosis not present

## 2023-07-10 MED ORDER — ONABOTULINUMTOXINA 200 UNITS IJ SOLR
155.0000 [IU] | Freq: Once | INTRAMUSCULAR | Status: AC
Start: 2023-07-10 — End: 2023-07-10
  Administered 2023-07-10: 155 [IU] via INTRAMUSCULAR

## 2023-07-10 NOTE — Progress Notes (Signed)
 Botox- 200 units x 1 vial Lot: [id number] Expiration:11/2025 NDC: 1610-9604-54   Bacteriostatic 0.9% Sodium Chloride- * mL  Lot: [id number] Expiration: 01/24/2024 NDC: 1478-2956-21   Dx:G43.709 S/P Witnessed by: Camilo Cella, NP

## 2023-07-10 NOTE — Progress Notes (Signed)
 Update 07/10/2023 JM: Returns for repeat Botox, prior injection 04/08/2023.  Migraines remain well-controlled on Botox.  Can fluctuate especially more recently in setting of increased pollen and frequent weather changes.  Continue jaw pain, only some improvement after prior masseter injection, clenching grinding can worsen headaches.  Use of rizatriptan with benefit.  Tolerated procedure well.  Return in 3 months for repeat injection.     Consent Form Botulism Toxin Injection For Chronic Migraine    Reviewed orally with patient, additionally signature is on file:  Botulism toxin has been approved by the Federal drug administration for treatment of chronic migraine. Botulism toxin does not cure chronic migraine and it may not be effective in some patients.  The administration of botulism toxin is accomplished by injecting a small amount of toxin into the muscles of the neck and head. Dosage must be titrated for each individual. Any benefits resulting from botulism toxin tend to wear off after 3 months with a repeat injection required if benefit is to be maintained. Injections are usually done every 3-4 months with maximum effect peak achieved by about 2 or 3 weeks. Botulism toxin is expensive and you should be sure of what costs you will incur resulting from the injection.  The side effects of botulism toxin use for chronic migraine may include:   -Transient, and usually mild, facial weakness with facial injections  -Transient, and usually mild, head or neck weakness with head/neck injections  -Reduction or loss of forehead facial animation due to forehead muscle weakness  -Eyelid drooping  -Dry eye  -Pain at the site of injection or bruising at the site of injection  -Double vision  -Potential unknown long term risks   Contraindications: You should not have Botox if you are pregnant, nursing, allergic to albumin, have an infection, skin condition, or muscle weakness at the site of  the injection, or have myasthenia gravis, Lambert-Eaton syndrome, or ALS.  It is also possible that as with any injection, there may be an allergic reaction or no effect from the medication. Reduced effectiveness after repeated injections is sometimes seen and rarely infection at the injection site may occur. All care will be taken to prevent these side effects. If therapy is given over a long time, atrophy and wasting in the muscle injected may occur. Occasionally the patient's become refractory to treatment because they develop antibodies to the toxin. In this event, therapy needs to be modified.  I have read the above information and consent to the administration of botulism toxin.    BOTOX PROCEDURE NOTE FOR MIGRAINE HEADACHE  Contraindications and precautions discussed with patient(above). Aseptic procedure was observed and patient tolerated procedure. Procedure performed by Ihor Austin, AGNP-BC.   The condition has existed for more than 6 months, and pt does not have a diagnosis of ALS, Myasthenia Gravis or Lambert-Eaton Syndrome.  Risks and benefits of injections discussed and pt agrees to proceed with the procedure.  Written consent obtained  These injections are medically necessary. Pt  receives good benefits from these injections. These injections do not cause sedations or hallucinations which the oral therapies may cause.   Description of procedure:  The patient was placed in a sitting position. The standard protocol was used for Botox as follows, with 5 units of Botox injected at each site:  -Procerus muscle, midline injection  -Corrugator muscle, bilateral injection  -Frontalis muscle, bilateral injection, with 2 sites each side, medial injection was performed in the upper one third of the  frontalis muscle, in the region vertical from the medial inferior edge of the superior orbital rim. The lateral injection was again in the upper one third of the forehead vertically above  the lateral limbus of the cornea, 1.5 cm lateral to the medial injection site.  -Temporalis muscle injection, 4 sites, bilaterally. The first injection was 3 cm above the tragus of the ear, second injection site was 1.5 cm to 3 cm up from the first injection site in line with the tragus of the ear. The third injection site was 1.5-3 cm forward between the first 2 injection sites. The fourth injection site was 1.5 cm posterior to the second injection site. 5th site laterally in the temporalis  muscleat the level of the outer canthus.  -Occipitalis muscle injection, 3 sites, bilaterally. The first injection was done one half way between the occipital protuberance and the tip of the mastoid process behind the ear. The second injection site was done lateral and superior to the first, 1 fingerbreadth from the first injection. The third injection site was 1 fingerbreadth superiorly and medially from the first injection site.  -Cervical paraspinal muscle injection, 2 sites, bilaterally. The first injection site was 1 cm from the midline of the cervical spine, 3 cm inferior to the lower border of the occipital protuberance. The second injection site was 1.5 cm superiorly and laterally to the first injection site.  -Trapezius muscle injection was performed at 3 sites, bilaterally. The first injection site was in the upper trapezius muscle halfway between the inflection point of the neck, and the acromion. The second injection site was one half way between the acromion and the first injection site. The third injection was done between the first injection site and the inflection point of the neck.  -Masseter muscle injection was performed at 1 site, bilaterally.     A total of 200 units of Botox was prepared, 165 units of Botox was injected as documented above, any Botox not injected was wasted. The patient tolerated the procedure well, there were no complications of the above procedure.   Johny Nap,  AGNP-BC  [facility] [address] [place], [place] [address]  Phone [phone] Fax [phone] Note: This document was prepared with digital dictation and possible smart phrase technology. Any transcriptional errors that result from this process are unintentional.

## 2023-07-19 ENCOUNTER — Other Ambulatory Visit: Payer: Self-pay | Admitting: Internal Medicine

## 2023-07-31 ENCOUNTER — Other Ambulatory Visit: Payer: Self-pay | Admitting: Internal Medicine

## 2023-08-02 ENCOUNTER — Other Ambulatory Visit: Payer: Self-pay

## 2023-08-02 ENCOUNTER — Emergency Department (HOSPITAL_BASED_OUTPATIENT_CLINIC_OR_DEPARTMENT_OTHER)
Admission: EM | Admit: 2023-08-02 | Discharge: 2023-08-02 | Disposition: A | Discharge status: Home / Self Care | Attending: Emergency Medicine | Admitting: Emergency Medicine

## 2023-08-02 ENCOUNTER — Encounter (HOSPITAL_BASED_OUTPATIENT_CLINIC_OR_DEPARTMENT_OTHER): Payer: Self-pay | Admitting: *Deleted

## 2023-08-02 DIAGNOSIS — S0501XA Injury of conjunctiva and corneal abrasion without foreign body, right eye, initial encounter: Secondary | ICD-10-CM | POA: Diagnosis not present

## 2023-08-02 DIAGNOSIS — X58XXXA Exposure to other specified factors, initial encounter: Secondary | ICD-10-CM | POA: Diagnosis not present

## 2023-08-02 DIAGNOSIS — H5711 Ocular pain, right eye: Secondary | ICD-10-CM | POA: Diagnosis present

## 2023-08-02 MED ORDER — FLUORESCEIN SODIUM 1 MG OP STRP
1.0000 | ORAL_STRIP | Freq: Once | OPHTHALMIC | Status: AC
  Administered 2023-08-02: 1 via OPHTHALMIC
  Filled 2023-08-02: qty 1

## 2023-08-02 MED ORDER — TETRACAINE HCL 0.5 % OP SOLN
2.0000 [drp] | Freq: Once | OPHTHALMIC | Status: AC
  Administered 2023-08-02: 2 [drp] via OPHTHALMIC
  Filled 2023-08-02: qty 4

## 2023-08-02 MED ORDER — ERYTHROMYCIN 5 MG/GM OP OINT: TOPICAL_OINTMENT | OPHTHALMIC | 0 refills | Status: DC

## 2023-08-02 NOTE — ED Triage Notes (Signed)
 Patient to ED POV after daughter thumb poked her in the eye. Pain since event and swelling noted. Patient reporting she is unable to open her eye. No bleeding or drainage noted.

## 2023-08-02 NOTE — ED Provider Notes (Signed)
 [facility] EMERGENCY DEPARTMENT AT [facility] Provider Note   CSN: [id number] Arrival date & time: 08/02/23  1343     History Chief Complaint  Patient presents with   Eye Pain     Eye Pain   Kayla Floyd is a 43 y.o. female presenting for pain and irritation to the right eye after being jabbed in that eye with her 52-year-old thumb.  She is unsure if there is any change in her vision as she has been unable to open the eye due to pain and burning in the eye when opened.  Left eye is unaffected and she has no other associated symptoms at the moment.  Does endorse increased lacrimation.  Denies sensation of foreign body in the eye   Patient's recorded medical, surgical, social, medication list and allergies were reviewed in the Snapshot window as part of the initial history.   Review of Systems   Review of Systems  Eyes:  Positive for pain and redness.  All other systems reviewed and are negative.   Physical Exam Updated Vital Signs BP (!) 139/90 (BP Location: Right Arm)   Pulse (!) 119   Temp 97.9 F (36.6 C) (Oral)   Resp 14   SpO2 99%  Physical Exam Vitals and nursing note reviewed.  Constitutional:      General: She is not in acute distress.    Appearance: Normal appearance.  HENT:     Head: Normocephalic and atraumatic.     Mouth/Throat:     Mouth: Mucous membranes are moist.     Pharynx: Oropharynx is clear.  Eyes:     General: Lids are normal.        Right eye: No foreign body or discharge.        Left eye: No foreign body or discharge.     Extraocular Movements: Extraocular movements intact.     Conjunctiva/sclera:     Right eye: Right conjunctiva is injected. No exudate or hemorrhage.    Left eye: Left conjunctiva is not injected. No chemosis, exudate or hemorrhage.    Pupils: Pupils are equal, round, and reactive to light.     Visual Fields: Right eye visual fields normal and left eye visual fields normal.      Comments: Fluorescein exam  completed on the right eye with uptake noted at the 7-8 o'clock space at the iris.  Cardiovascular:     Rate and Rhythm: Normal rate and regular rhythm.     Pulses: Normal pulses.     Heart sounds: Normal heart sounds. No murmur heard.    No friction rub. No gallop.  Pulmonary:     Effort: Pulmonary effort is normal.     Breath sounds: Normal breath sounds.  Abdominal:     General: Abdomen is flat. Bowel sounds are normal.     Palpations: Abdomen is soft.  Musculoskeletal:        General: Normal range of motion.     Cervical back: Normal range of motion and neck supple.     Right lower leg: No edema.     Left lower leg: No edema.  Skin:    General: Skin is warm and dry.     Capillary Refill: Capillary refill takes less than 2 seconds.  Neurological:     General: No focal deficit present.     Mental Status: She is alert. Mental status is at baseline.  Psychiatric:        Mood and Affect: Mood normal.  ED Course/ Medical Decision Making/ A&P    Procedures Procedures   Medications Ordered in ED Medications  fluorescein ophthalmic strip 1 strip (1 strip Right Eye Given 08/02/23 1401)  tetracaine (PONTOCAINE) 0.5 % ophthalmic solution 2 drop (2 drops Right Eye Given 08/02/23 1401)    Medical Decision Making:   Kayla Floyd is a 43 y.o. female who presented to the ED today with right eye pain detailed above.     Complete initial physical exam performed, notably the patient  was photosensitive to the right eye as well as having increased irritation lacrimation.  Eyelid everted and no foreign bodies were noted.  As noted in physical exam the fluorescein stain did have positive uptake at the 7 to 8 o'clock position on the iris of the right eye..    Reviewed and confirmed nursing documentation for past medical history, family history, social history.    Initial Assessment:   With the patient's presentation of right eye pain, most likely diagnosis is corneal abrasion.     Initial Plan:  Fluorescein stain of right eye to assess for corneal abrasion Objective evaluation as below reviewed   Reassessment and Plan:   With history and physical exam along with positive fluorescein uptake, this is likely a corneal abrasion and will manage with topical antibiotic/steroid drops.  She is to follow-up with primary care as needed for continued management.    Clinical Impression:  1. Abrasion of right cornea, initial encounter      Discharge   Final Clinical Impression(s) / ED Diagnoses Final diagnoses:  Abrasion of right cornea, initial encounter    Rx / DC Orders ED Discharge Orders          Ordered    erythromycin ophthalmic ointment        08/02/23 1437              Juanetta Nordmann, PA 08/02/23 1437    Celesta Coke K, DO 08/02/23 1453

## 2023-08-02 NOTE — ED Notes (Signed)
 Pt CA&O to baseline. D/C instructions discussed with patient and all questions answered. All belongings with patient. Pt ambulatory on discharge.

## 2023-08-14 ENCOUNTER — Encounter: Payer: Self-pay | Admitting: Internal Medicine

## 2023-08-14 NOTE — Progress Notes (Signed)
 Subjective:    Patient ID: Kayla Floyd, female    DOB: 09/23/1980, 42 y.o.   MRN: [id number]     HPI Kayla Floyd is here for follow up of her chronic medical problems.  A1c last visit 6.5%  More joint pain - hands, wrists, shoulders  Not always able to take the atenolol  because her blood pressure is on the low side.  She is trying to keep up with fluids, but has not been doing as good as she should.  Medications and allergies reviewed with patient and updated if appropriate.  Current Outpatient Medications on File Prior to Visit  Medication Sig Dispense Refill   acetaminophen  (TYLENOL ) 325 MG tablet Take 2 tablets (650 mg total) by mouth every 6 (six) hours as needed (pain).     atenolol  (TENORMIN ) 25 MG tablet Take 1 tablet (25 mg total) by mouth 2 (two) times daily. 180 tablet 1   atomoxetine  (STRATTERA ) 40 MG capsule Take 1 capsule (40 mg total) by mouth daily. 90 capsule 1   atorvastatin  (LIPITOR) 10 MG tablet TAKE 1 TABLET BY MOUTH EVERY DAY 30 tablet 5   AUVELITY  45-105 MG TBCR TAKE 1 TABLET BY MOUTH IN THE MORNING AND IN THE EVENING 60 tablet 2   b complex vitamins tablet Take 1 tablet by mouth daily.     baclofen  (LIORESAL ) 10 MG tablet TAKE 1 TABLET BY MOUTH THREE TIMES A DAY AS NEEDED FOR MUSCLE SPASM 180 tablet 1   botulinum toxin Type A  (BOTOX ) 200 units injection Inject 155 units into the muscles of head, and neck every 3 months discard remainder 1 each 2   cetirizine (ZYRTEC) 10 MG tablet Take 10 mg by mouth daily. Reported on 07/13/2015     EPINEPHrine  0.3 mg/0.3 mL IJ SOAJ injection Inject 0.3 mg into the muscle as needed for anaphylaxis. 1 each 5   erythromycin  ophthalmic ointment Place a 1/2 inch ribbon of ointment into the lower eyelid of right eye.  Use 4 times daily for 5 days. 3.5 g 0   fenofibrate  (TRICOR ) 145 MG tablet TAKE 1 TABLET (145MG  TOTAL) BY MOUTH EVERY DAY. 90 tablet 1   hydrOXYzine (ATARAX) 50 MG tablet TAKE 1 - 2 TABLETS BY MOUTH ONCE DAILY  AS NEEDED FOR ANXIETY/SLEEP 180 tablet 1   hyoscyamine  (LEVSIN SL) 0.125 MG SL tablet Take 1 tablet (0.125 mg total) by mouth every 6 (six) hours as needed. PLACE 1 TABLET UNDER THE TONGUE EVERY 6 (SIX) HOURS AS NEEDED. 120 tablet 1   metFORMIN  (GLUCOPHAGE ) 500 MG tablet Take 1 tablet (500 mg total) by mouth 2 (two) times daily with a meal. 180 tablet 2   Multiple Vitamin (MULTIVITAMIN) tablet Take 1 tablet by mouth daily.       nitrofurantoin , macrocrystal-monohydrate, (MACROBID ) 100 MG capsule Take 1 capsule (100 mg total) by mouth as needed. 30 capsule 1   norethindrone -ethinyl estradiol  (AUROVELA 1/20) 1-20 MG-MCG tablet Take 1 tablet by mouth daily. 84 tablet 3   omeprazole  (PRILOSEC) 40 MG capsule TAKE 1 CAPSULE BY MOUTH EVERY DAY 30 capsule 5   ondansetron  (ZOFRAN ) 8 MG tablet Take 1 tablet (8 mg total) by mouth 2 (two) times daily as needed for nausea or vomiting. 20 tablet 0   pregabalin  (LYRICA ) 150 MG capsule TAKE 1 CAPSULE BY MOUTH TWICE A DAY 180 capsule 0   Probiotic Product (PROBIOTIC-10 PO) Take 1 capsule by mouth daily.     promethazine  (PHENERGAN ) 12.5 MG tablet Take  by mouth daily as needed.     rizatriptan  (MAXALT ) 10 MG tablet Take 1 tablet (10 mg total) by mouth as needed for migraine. May repeat in 2 hours if needed 10 tablet 11   tiZANidine  (ZANAFLEX ) 4 MG tablet TAKE 1 TABLET BY MOUTH EVERY 8 HOURS AS NEEDED FOR MUSCLE SPASM 60 tablet 2   valACYclovir  (VALTREX ) 1000 MG tablet Take 1 tablet (1,000 mg total) by mouth daily. 90 tablet 3   No current facility-administered medications on file prior to visit.     Review of Systems  Constitutional:  Positive for fatigue.  Respiratory:  Positive for cough (dry - pollen) and shortness of breath (with quick bursts of activity). Negative for wheezing.   Cardiovascular:  Positive for chest pain (occ - not too bad) and palpitations. Negative for leg swelling.  Gastrointestinal:  Positive for abdominal distention (bloating recently)  and constipation.  Musculoskeletal:  Positive for arthralgias and neck pain.  Neurological:  Positive for light-headedness and headaches.       Objective:   Vitals:   08/15/23 1537  BP: 112/68  Pulse: (!) 120  Temp: 98.5 F (36.9 C)  SpO2: 98%   BP Readings from Last 3 Encounters:  08/15/23 112/68  08/02/23 130/80  07/10/23 (!) 137/90   Wt Readings from Last 3 Encounters:  08/15/23 182 lb (82.6 kg)  06/26/23 183 lb (83 kg)  06/25/23 185 lb (83.9 kg)   Body mass index is 30.29 kg/m.    Physical Exam Constitutional:      General: She is not in acute distress.    Appearance: Normal appearance.  HENT:     Head: Normocephalic and atraumatic.  Eyes:     Conjunctiva/sclera: Conjunctivae normal.  Cardiovascular:     Rate and Rhythm: Regular rhythm. Tachycardia present.     Heart sounds: Normal heart sounds.  Pulmonary:     Effort: Pulmonary effort is normal. No respiratory distress.     Breath sounds: Normal breath sounds. No wheezing.  Musculoskeletal:     Cervical back: Neck supple.     Right lower leg: No edema.     Left lower leg: No edema.  Lymphadenopathy:     Cervical: No cervical adenopathy.  Skin:    General: Skin is warm and dry.     Findings: No rash.  Neurological:     Mental Status: She is alert. Mental status is at baseline.  Psychiatric:        Mood and Affect: Mood normal.        Behavior: Behavior normal.        Lab Results  Component Value Date   WBC 6.8 02/14/2023   HGB 10.0 (L) 02/14/2023   HCT 32.3 (L) 02/14/2023   PLT 381.0 02/14/2023   GLUCOSE 92 02/14/2023   CHOL 186 02/14/2023   TRIG 182.0 (H) 02/14/2023   HDL 39.00 (L) 02/14/2023   LDLDIRECT 108.0 07/16/2022   LDLCALC 111 (H) 02/14/2023   ALT 12 02/14/2023   AST 20 02/14/2023   NA 138 02/14/2023   K 4.0 02/14/2023   CL 104 02/14/2023   CREATININE 0.77 02/14/2023   BUN 7 02/14/2023   CO2 26 02/14/2023   TSH 1.44 02/14/2023   INR 0.9 10/17/2020   HGBA1C 6.5 02/14/2023      Assessment & Plan:    See Problem List for Assessment and Plan of chronic medical problems.

## 2023-08-14 NOTE — Patient Instructions (Addendum)
      Blood work was ordered.       Medications changes include :   None      Return in about 6 months (around 02/15/2024) for Physical Exam.

## 2023-08-15 ENCOUNTER — Ambulatory Visit (INDEPENDENT_AMBULATORY_CARE_PROVIDER_SITE_OTHER): Payer: Medicare Other | Admitting: Internal Medicine

## 2023-08-15 ENCOUNTER — Other Ambulatory Visit: Payer: Self-pay | Admitting: Internal Medicine

## 2023-08-15 VITALS — BP 112/68 | HR 120 | Temp 98.5°F | Ht 65.0 in | Wt 182.0 lb

## 2023-08-15 DIAGNOSIS — G43709 Chronic migraine without aura, not intractable, without status migrainosus: Secondary | ICD-10-CM | POA: Diagnosis not present

## 2023-08-15 DIAGNOSIS — M549 Dorsalgia, unspecified: Secondary | ICD-10-CM

## 2023-08-15 DIAGNOSIS — F3289 Other specified depressive episodes: Secondary | ICD-10-CM

## 2023-08-15 DIAGNOSIS — G8929 Other chronic pain: Secondary | ICD-10-CM | POA: Diagnosis not present

## 2023-08-15 DIAGNOSIS — G901 Familial dysautonomia [Riley-Day]: Secondary | ICD-10-CM

## 2023-08-15 DIAGNOSIS — D509 Iron deficiency anemia, unspecified: Secondary | ICD-10-CM | POA: Diagnosis not present

## 2023-08-15 DIAGNOSIS — E782 Mixed hyperlipidemia: Secondary | ICD-10-CM

## 2023-08-15 DIAGNOSIS — F900 Attention-deficit hyperactivity disorder, predominantly inattentive type: Secondary | ICD-10-CM

## 2023-08-15 DIAGNOSIS — R7303 Prediabetes: Secondary | ICD-10-CM | POA: Diagnosis not present

## 2023-08-15 DIAGNOSIS — K219 Gastro-esophageal reflux disease without esophagitis: Secondary | ICD-10-CM

## 2023-08-15 DIAGNOSIS — F419 Anxiety disorder, unspecified: Secondary | ICD-10-CM

## 2023-08-15 LAB — COMPREHENSIVE METABOLIC PANEL WITH GFR
ALT: 12 U/L (ref 0–35)
AST: 14 U/L (ref 0–37)
Albumin: 4.4 g/dL (ref 3.5–5.2)
Alkaline Phosphatase: 31 U/L — ABNORMAL LOW (ref 39–117)
BUN: 9 mg/dL (ref 6–23)
CO2: 26 meq/L (ref 19–32)
Calcium: 9.8 mg/dL (ref 8.4–10.5)
Chloride: 104 meq/L (ref 96–112)
Creatinine, Ser: 0.78 mg/dL (ref 0.40–1.20)
GFR: 92.9 mL/min (ref >60.00)
Glucose, Bld: 95 mg/dL (ref 70–99)
Potassium: 3.9 meq/L (ref 3.5–5.1)
Sodium: 138 meq/L (ref 135–145)
Total Bilirubin: 0.2 mg/dL (ref 0.2–1.2)
Total Protein: 7.6 g/dL (ref 6.0–8.3)

## 2023-08-15 LAB — IBC PANEL
Iron: 21 ug/dL — ABNORMAL LOW (ref 42–145)
Saturation Ratios: 3.1 % — ABNORMAL LOW (ref 20.0–50.0)
TIBC: 676.2 ug/dL — ABNORMAL HIGH (ref 250.0–450.0)
Transferrin: 483 mg/dL — ABNORMAL HIGH (ref 212.0–360.0)

## 2023-08-15 LAB — CBC WITH DIFFERENTIAL/PLATELET
Basophils Absolute: 0.1 10*3/uL (ref 0.0–0.1)
Basophils Relative: 0.9 % (ref 0.0–3.0)
Eosinophils Absolute: 0.1 10*3/uL (ref 0.0–0.7)
Eosinophils Relative: 1 % (ref 0.0–5.0)
HCT: 31.3 % — ABNORMAL LOW (ref 36.0–46.0)
Hemoglobin: 9.9 g/dL — ABNORMAL LOW (ref 12.0–15.0)
Lymphocytes Relative: 40.7 % (ref 12.0–46.0)
Lymphs Abs: 2.7 10*3/uL (ref 0.7–4.0)
MCHC: 31.5 g/dL (ref 30.0–36.0)
MCV: 72.7 fl — ABNORMAL LOW (ref 78.0–100.0)
Monocytes Absolute: 0.4 10*3/uL (ref 0.1–1.0)
Monocytes Relative: 6.6 % (ref 3.0–12.0)
Neutro Abs: 3.4 10*3/uL (ref 1.4–7.7)
Neutrophils Relative %: 50.8 % (ref 43.0–77.0)
Platelets: 334 10*3/uL (ref 150.0–400.0)
RBC: 4.3 Mil/uL (ref 3.87–5.11)
RDW: 16.9 % — ABNORMAL HIGH (ref 11.5–15.5)
WBC: 6.7 10*3/uL (ref 4.0–10.5)

## 2023-08-15 LAB — LIPID PANEL
Cholesterol: 196 mg/dL (ref 0–200)
HDL: 41 mg/dL (ref >39.00)
LDL Cholesterol: 101 mg/dL — ABNORMAL HIGH (ref 0–99)
NonHDL: 155.22
Total CHOL/HDL Ratio: 5
Triglycerides: 269 mg/dL — ABNORMAL HIGH (ref 0.0–149.0)
VLDL: 53.8 mg/dL — ABNORMAL HIGH (ref 0.0–40.0)

## 2023-08-15 LAB — HEMOGLOBIN A1C: Hgb A1c MFr Bld: 6.4 % (ref 4.6–6.5)

## 2023-08-15 LAB — FERRITIN: Ferritin: 3.4 ng/mL — ABNORMAL LOW (ref 10.0–291.0)

## 2023-08-15 MED ORDER — METFORMIN HCL 500 MG PO TABS: 500.0000 mg | ORAL_TABLET | Freq: Two times a day (BID) | ORAL | 2 refills | Status: DC

## 2023-08-15 MED ORDER — HYOSCYAMINE SULFATE 0.125 MG SL SUBL: 0.1250 mg | SUBLINGUAL_TABLET | Freq: Four times a day (QID) | SUBLINGUAL | 1 refills | Status: DC | PRN

## 2023-08-15 MED ORDER — ATOMOXETINE HCL 40 MG PO CAPS: 40.0000 mg | ORAL_CAPSULE | Freq: Every day | ORAL | 1 refills | Status: DC

## 2023-08-15 MED ORDER — FENOFIBRATE 145 MG PO TABS: ORAL_TABLET | ORAL | 1 refills | Status: DC

## 2023-08-15 MED ORDER — ATENOLOL 25 MG PO TABS: 25.0000 mg | ORAL_TABLET | Freq: Two times a day (BID) | ORAL | 1 refills | Status: DC

## 2023-08-15 NOTE — Assessment & Plan Note (Signed)
 Chronic No longer seeing psychiatrist and I will prescribe her medications Continue Auvelity  45-105 mg twice daily

## 2023-08-15 NOTE — Assessment & Plan Note (Signed)
 Chronic Taking iron daily Ck cbc, iron panel

## 2023-08-15 NOTE — Assessment & Plan Note (Signed)
 Chronic Continue Lyrica  150 mg twice daily, tizanidine  4 mg 3 times daily as needed or baclofen  10 mg 3 times daily-does not take both at the same time

## 2023-08-15 NOTE — Assessment & Plan Note (Signed)
 Chronic No longer seeing psychiatry so I will for start prescribing her medication Continue Strattera 40 mg daily

## 2023-08-15 NOTE — Assessment & Plan Note (Signed)
 Chronic GERD controlled Continue omeprazole 40 mg daily

## 2023-08-15 NOTE — Assessment & Plan Note (Signed)
 Chronic Lab Results  Component Value Date   HGBA1C 6.5 02/14/2023   Check a1c Low sugar / carb diet Stressed regular exercise Continue metformin  500 mg twice daily

## 2023-08-15 NOTE — Assessment & Plan Note (Signed)
 Chronic Regular exercise and healthy diet encouraged Check lipid panel, cmp Continue atorvastatin 10 mg daily, fenofibrate 145 mg daily

## 2023-08-15 NOTE — Assessment & Plan Note (Signed)
 Chronic no longer be following with psychiatry  Continue Auvelity  45-105 mg twice daily, hydroxyzine 50-100 mg nightly as needed

## 2023-08-15 NOTE — Assessment & Plan Note (Addendum)
 Chronic Controlled Management per the headache center Getting botox  Taking Maxalt  10 mg prn

## 2023-08-15 NOTE — Assessment & Plan Note (Signed)
 Chronic Follows with cardiology-Dr. Rodolfo Clan Continue atenolol  25 mg bid-cannot take it sometimes secondary to low BP Not wearing compression socks - always too hot Stressed increase water intake Stressed regular exercise

## 2023-08-17 ENCOUNTER — Ambulatory Visit: Payer: Self-pay | Admitting: Internal Medicine

## 2023-08-24 ENCOUNTER — Emergency Department (HOSPITAL_BASED_OUTPATIENT_CLINIC_OR_DEPARTMENT_OTHER)

## 2023-08-24 ENCOUNTER — Encounter (HOSPITAL_COMMUNITY): Payer: Self-pay | Admitting: Orthopedic Surgery

## 2023-08-24 ENCOUNTER — Encounter (HOSPITAL_COMMUNITY)
Admission: EM | Disposition: A | Discharge status: Home / Self Care | Payer: Self-pay | Source: Home / Self Care | Attending: Emergency Medicine

## 2023-08-24 ENCOUNTER — Other Ambulatory Visit: Payer: Self-pay

## 2023-08-24 ENCOUNTER — Emergency Department (HOSPITAL_COMMUNITY): Admitting: Certified Registered Nurse Anesthetist

## 2023-08-24 ENCOUNTER — Emergency Department (HOSPITAL_BASED_OUTPATIENT_CLINIC_OR_DEPARTMENT_OTHER): Admitting: Certified Registered Nurse Anesthetist

## 2023-08-24 ENCOUNTER — Ambulatory Visit (HOSPITAL_BASED_OUTPATIENT_CLINIC_OR_DEPARTMENT_OTHER)
Admission: EM | Admit: 2023-08-24 | Discharge: 2023-08-24 | Disposition: A | Discharge status: Home / Self Care | Attending: Emergency Medicine | Admitting: Emergency Medicine

## 2023-08-24 DIAGNOSIS — Z87891 Personal history of nicotine dependence: Secondary | ICD-10-CM | POA: Diagnosis not present

## 2023-08-24 DIAGNOSIS — Z79899 Other long term (current) drug therapy: Secondary | ICD-10-CM | POA: Insufficient documentation

## 2023-08-24 DIAGNOSIS — K828 Other specified diseases of gallbladder: Secondary | ICD-10-CM | POA: Diagnosis not present

## 2023-08-24 DIAGNOSIS — K819 Cholecystitis, unspecified: Secondary | ICD-10-CM

## 2023-08-24 DIAGNOSIS — I10 Essential (primary) hypertension: Secondary | ICD-10-CM | POA: Diagnosis not present

## 2023-08-24 DIAGNOSIS — R079 Chest pain, unspecified: Secondary | ICD-10-CM | POA: Diagnosis not present

## 2023-08-24 DIAGNOSIS — F418 Other specified anxiety disorders: Secondary | ICD-10-CM | POA: Insufficient documentation

## 2023-08-24 DIAGNOSIS — K8010 Calculus of gallbladder with chronic cholecystitis without obstruction: Secondary | ICD-10-CM | POA: Diagnosis not present

## 2023-08-24 DIAGNOSIS — E1143 Type 2 diabetes mellitus with diabetic autonomic (poly)neuropathy: Secondary | ICD-10-CM | POA: Insufficient documentation

## 2023-08-24 DIAGNOSIS — Z743 Need for continuous supervision: Secondary | ICD-10-CM | POA: Diagnosis not present

## 2023-08-24 DIAGNOSIS — K8012 Calculus of gallbladder with acute and chronic cholecystitis without obstruction: Secondary | ICD-10-CM | POA: Diagnosis not present

## 2023-08-24 DIAGNOSIS — K3184 Gastroparesis: Secondary | ICD-10-CM | POA: Diagnosis not present

## 2023-08-24 DIAGNOSIS — K5790 Diverticulosis of intestine, part unspecified, without perforation or abscess without bleeding: Secondary | ICD-10-CM | POA: Diagnosis not present

## 2023-08-24 DIAGNOSIS — K219 Gastro-esophageal reflux disease without esophagitis: Secondary | ICD-10-CM | POA: Diagnosis not present

## 2023-08-24 DIAGNOSIS — R000 Tachycardia, unspecified: Secondary | ICD-10-CM | POA: Diagnosis not present

## 2023-08-24 DIAGNOSIS — K810 Acute cholecystitis: Secondary | ICD-10-CM | POA: Diagnosis not present

## 2023-08-24 DIAGNOSIS — N289 Disorder of kidney and ureter, unspecified: Secondary | ICD-10-CM | POA: Insufficient documentation

## 2023-08-24 DIAGNOSIS — R0789 Other chest pain: Secondary | ICD-10-CM | POA: Diagnosis not present

## 2023-08-24 DIAGNOSIS — Z7984 Long term (current) use of oral hypoglycemic drugs: Secondary | ICD-10-CM | POA: Diagnosis not present

## 2023-08-24 DIAGNOSIS — R1084 Generalized abdominal pain: Secondary | ICD-10-CM | POA: Diagnosis not present

## 2023-08-24 DIAGNOSIS — R109 Unspecified abdominal pain: Secondary | ICD-10-CM | POA: Diagnosis not present

## 2023-08-24 HISTORY — DX: Mast cell activation, unspecified: D89.40

## 2023-08-24 HISTORY — PX: INDOCYANINE GREEN FLUORESCENCE IMAGING (ICG): SHX7595

## 2023-08-24 HISTORY — PX: CHOLECYSTECTOMY: SHX55

## 2023-08-24 HISTORY — DX: Prediabetes: R73.03

## 2023-08-24 HISTORY — DX: Hypermobile Ehlers-Danlos syndrome: Q79.62

## 2023-08-24 LAB — CBC WITH DIFFERENTIAL/PLATELET
Abs Immature Granulocytes: 0.02 10*3/uL (ref 0.00–0.07)
Basophils Absolute: 0.1 10*3/uL (ref 0.0–0.1)
Basophils Relative: 1 %
Eosinophils Absolute: 0.1 10*3/uL (ref 0.0–0.5)
Eosinophils Relative: 1 %
HCT: 32.7 % — ABNORMAL LOW (ref 36.0–46.0)
Hemoglobin: 10.2 g/dL — ABNORMAL LOW (ref 12.0–15.0)
Immature Granulocytes: 0 %
Lymphocytes Relative: 40 %
Lymphs Abs: 2.3 10*3/uL (ref 0.7–4.0)
MCH: 23.2 pg — ABNORMAL LOW (ref 26.0–34.0)
MCHC: 31.2 g/dL (ref 30.0–36.0)
MCV: 74.5 fL — ABNORMAL LOW (ref 80.0–100.0)
Monocytes Absolute: 0.4 10*3/uL (ref 0.1–1.0)
Monocytes Relative: 6 %
Neutro Abs: 2.8 10*3/uL (ref 1.7–7.7)
Neutrophils Relative %: 52 %
Platelets: 342 10*3/uL (ref 150–400)
RBC: 4.39 MIL/uL (ref 3.87–5.11)
RDW: 15.6 % — ABNORMAL HIGH (ref 11.5–15.5)
WBC: 5.6 10*3/uL (ref 4.0–10.5)
nRBC: 0 % (ref 0.0–0.2)

## 2023-08-24 LAB — COMPREHENSIVE METABOLIC PANEL WITH GFR
ALT: 18 U/L (ref 0–44)
AST: 25 U/L (ref 15–41)
Albumin: 4.6 g/dL (ref 3.5–5.0)
Alkaline Phosphatase: 42 U/L (ref 38–126)
Anion gap: 16 — ABNORMAL HIGH (ref 5–15)
BUN: 9 mg/dL (ref 6–20)
CO2: 21 mmol/L — ABNORMAL LOW (ref 22–32)
Calcium: 10.1 mg/dL (ref 8.9–10.3)
Chloride: 101 mmol/L (ref 98–111)
Creatinine, Ser: 0.78 mg/dL (ref 0.44–1.00)
GFR, Estimated: >60 mL/min (ref >60)
Glucose, Bld: 107 mg/dL — ABNORMAL HIGH (ref 70–99)
Potassium: 3.8 mmol/L (ref 3.5–5.1)
Sodium: 138 mmol/L (ref 135–145)
Total Bilirubin: <0.2 mg/dL (ref 0.0–1.2)
Total Protein: 8.2 g/dL — ABNORMAL HIGH (ref 6.5–8.1)

## 2023-08-24 LAB — TROPONIN T, HIGH SENSITIVITY: Troponin T High Sensitivity: <15 ng/L (ref <19)

## 2023-08-24 LAB — LIPASE, BLOOD: Lipase: 50 U/L (ref 11–51)

## 2023-08-24 LAB — HCG, SERUM, QUALITATIVE: Preg, Serum: NEGATIVE

## 2023-08-24 SURGERY — LAPAROSCOPIC CHOLECYSTECTOMY
Anesthesia: General

## 2023-08-24 MED ORDER — BUPIVACAINE-EPINEPHRINE (PF) 0.25% -1:200000 IJ SOLN
INTRAMUSCULAR | Status: AC
  Filled 2023-08-24: qty 30

## 2023-08-24 MED ORDER — CHLORHEXIDINE GLUCONATE 0.12 % MT SOLN
OROMUCOSAL | Status: AC
  Filled 2023-08-24: qty 15

## 2023-08-24 MED ORDER — ACETAMINOPHEN 500 MG PO TABS
1000.0000 mg | ORAL_TABLET | ORAL | Status: AC
  Administered 2023-08-24: 1000 mg via ORAL

## 2023-08-24 MED ORDER — BUPIVACAINE-EPINEPHRINE 0.25% -1:200000 IJ SOLN
INTRAMUSCULAR | Status: DC | PRN
  Administered 2023-08-24: 30 mL

## 2023-08-24 MED ORDER — PROPOFOL 500 MG/50ML IV EMUL
INTRAVENOUS | Status: DC | PRN
Start: 2023-08-24 — End: 2023-08-24
  Administered 2023-08-24: 75 ug/kg/min via INTRAVENOUS

## 2023-08-24 MED ORDER — LABETALOL HCL 5 MG/ML IV SOLN
INTRAVENOUS | Status: DC | PRN
  Administered 2023-08-24: 10 mg via INTRAVENOUS

## 2023-08-24 MED ORDER — SODIUM CHLORIDE 0.9 % IV SOLN
2.0000 g | Freq: Once | INTRAVENOUS | Status: AC
  Administered 2023-08-24: 2 g via INTRAVENOUS
  Filled 2023-08-24: qty 20

## 2023-08-24 MED ORDER — CELECOXIB 200 MG PO CAPS
ORAL_CAPSULE | ORAL | Status: AC
  Filled 2023-08-24: qty 1

## 2023-08-24 MED ORDER — ROCURONIUM BROMIDE 10 MG/ML (PF) SYRINGE
PREFILLED_SYRINGE | INTRAVENOUS | Status: DC | PRN
  Administered 2023-08-24: 50 mg via INTRAVENOUS

## 2023-08-24 MED ORDER — MIDAZOLAM HCL 2 MG/2ML IJ SOLN
INTRAMUSCULAR | Status: DC | PRN
  Administered 2023-08-24: 2 mg via INTRAVENOUS

## 2023-08-24 MED ORDER — INDOCYANINE GREEN 25 MG IV SOLR
2.5000 mg | Freq: Once | INTRAVENOUS | Status: AC
  Administered 2023-08-24: 2.5 mg via INTRAVENOUS
  Filled 2023-08-24: qty 10

## 2023-08-24 MED ORDER — SODIUM CHLORIDE 0.9 % IV BOLUS
1000.0000 mL | Freq: Once | INTRAVENOUS | Status: AC
  Administered 2023-08-24: 1000 mL via INTRAVENOUS

## 2023-08-24 MED ORDER — GABAPENTIN 300 MG PO CAPS
ORAL_CAPSULE | ORAL | Status: AC
  Filled 2023-08-24: qty 1

## 2023-08-24 MED ORDER — LIDOCAINE 2% (20 MG/ML) 5 ML SYRINGE
INTRAMUSCULAR | Status: DC | PRN
  Administered 2023-08-24: 40 mg via INTRAVENOUS

## 2023-08-24 MED ORDER — 0.9 % SODIUM CHLORIDE (POUR BTL) OPTIME
TOPICAL | Status: DC | PRN
  Administered 2023-08-24: 1000 mL

## 2023-08-24 MED ORDER — OXYCODONE HCL 5 MG/5ML PO SOLN: 5.0000 mg | Freq: Once | ORAL | Status: AC | PRN

## 2023-08-24 MED ORDER — SUGAMMADEX SODIUM 200 MG/2ML IV SOLN
INTRAVENOUS | Status: DC | PRN
  Administered 2023-08-24: 165.2 mg via INTRAVENOUS

## 2023-08-24 MED ORDER — OXYCODONE HCL 5 MG PO TABS: 5.0000 mg | ORAL_TABLET | ORAL | 0 refills | Status: DC | PRN

## 2023-08-24 MED ORDER — ORAL CARE MOUTH RINSE: 15.0000 mL | Freq: Once | OROMUCOSAL | Status: AC

## 2023-08-24 MED ORDER — LACTATED RINGERS IV SOLN: INTRAVENOUS | Status: DC

## 2023-08-24 MED ORDER — OXYCODONE HCL 5 MG PO TABS
ORAL_TABLET | ORAL | Status: AC
Start: 2023-08-24 — End: ?
  Filled 2023-08-24: qty 1

## 2023-08-24 MED ORDER — DROPERIDOL 2.5 MG/ML IJ SOLN
0.6250 mg | Freq: Once | INTRAMUSCULAR | Status: AC | PRN
  Administered 2023-08-24: 0.625 mg via INTRAVENOUS

## 2023-08-24 MED ORDER — CHLORHEXIDINE GLUCONATE 0.12 % MT SOLN
15.0000 mL | Freq: Once | OROMUCOSAL | Status: AC
  Administered 2023-08-24: 15 mL via OROMUCOSAL

## 2023-08-24 MED ORDER — CELECOXIB 200 MG PO CAPS
200.0000 mg | ORAL_CAPSULE | ORAL | Status: DC
  Administered 2023-08-24: 200 mg via ORAL

## 2023-08-24 MED ORDER — PROPOFOL 10 MG/ML IV BOLUS
INTRAVENOUS | Status: DC | PRN
  Administered 2023-08-24: 140 mg via INTRAVENOUS

## 2023-08-24 MED ORDER — GABAPENTIN 300 MG PO CAPS
300.0000 mg | ORAL_CAPSULE | ORAL | Status: AC
  Administered 2023-08-24: 300 mg via ORAL

## 2023-08-24 MED ORDER — SUCCINYLCHOLINE CHLORIDE 200 MG/10ML IV SOSY
PREFILLED_SYRINGE | INTRAVENOUS | Status: DC | PRN
  Administered 2023-08-24: 120 mg via INTRAVENOUS

## 2023-08-24 MED ORDER — SCOPOLAMINE 1 MG/3DAYS TD PT72
1.0000 | MEDICATED_PATCH | TRANSDERMAL | Status: DC
  Administered 2023-08-24: 1.5 mg via TRANSDERMAL

## 2023-08-24 MED ORDER — PANTOPRAZOLE SODIUM 40 MG IV SOLR
40.0000 mg | Freq: Once | INTRAVENOUS | Status: AC
  Administered 2023-08-24: 40 mg via INTRAVENOUS
  Filled 2023-08-24: qty 10

## 2023-08-24 MED ORDER — OXYCODONE HCL 5 MG PO TABS
5.0000 mg | ORAL_TABLET | Freq: Once | ORAL | Status: AC | PRN
  Administered 2023-08-24: 5 mg via ORAL

## 2023-08-24 MED ORDER — DEXAMETHASONE SODIUM PHOSPHATE 10 MG/ML IJ SOLN
INTRAMUSCULAR | Status: DC | PRN
  Administered 2023-08-24: 10 mg via INTRAVENOUS

## 2023-08-24 MED ORDER — ACETAMINOPHEN 10 MG/ML IV SOLN: 1000.0000 mg | Freq: Once | INTRAVENOUS | Status: DC | PRN

## 2023-08-24 MED ORDER — FENTANYL CITRATE (PF) 250 MCG/5ML IJ SOLN
INTRAMUSCULAR | Status: AC
Start: 2023-08-24 — End: ?
  Filled 2023-08-24: qty 5

## 2023-08-24 MED ORDER — IOHEXOL 300 MG/ML  SOLN
100.0000 mL | Freq: Once | INTRAMUSCULAR | Status: AC | PRN
  Administered 2023-08-24: 100 mL via INTRAVENOUS

## 2023-08-24 MED ORDER — SODIUM CHLORIDE 0.9 % IR SOLN
Status: DC | PRN
  Administered 2023-08-24: 1000 mL

## 2023-08-24 MED ORDER — FENTANYL CITRATE (PF) 250 MCG/5ML IJ SOLN
INTRAMUSCULAR | Status: DC | PRN
  Administered 2023-08-24 (×2): 50 ug via INTRAVENOUS
  Administered 2023-08-24: 100 ug via INTRAVENOUS

## 2023-08-24 MED ORDER — MIDAZOLAM HCL 2 MG/2ML IJ SOLN
INTRAMUSCULAR | Status: AC
  Filled 2023-08-24: qty 2

## 2023-08-24 MED ORDER — DROPERIDOL 2.5 MG/ML IJ SOLN
INTRAMUSCULAR | Status: AC
  Filled 2023-08-24: qty 2

## 2023-08-24 MED ORDER — OXYCODONE HCL 5 MG PO TABS: 5.0000 mg | ORAL_TABLET | Freq: Once | ORAL | Status: DC | PRN

## 2023-08-24 MED ORDER — SCOPOLAMINE 1 MG/3DAYS TD PT72
MEDICATED_PATCH | TRANSDERMAL | Status: AC
  Filled 2023-08-24: qty 1

## 2023-08-24 MED ORDER — OXYCODONE HCL 5 MG/5ML PO SOLN: 5.0000 mg | Freq: Once | ORAL | Status: DC | PRN

## 2023-08-24 MED ORDER — FENTANYL CITRATE (PF) 100 MCG/2ML IJ SOLN: 25.0000 ug | INTRAMUSCULAR | Status: DC | PRN

## 2023-08-24 MED ORDER — MORPHINE SULFATE (PF) 4 MG/ML IV SOLN
4.0000 mg | Freq: Once | INTRAVENOUS | Status: AC
  Administered 2023-08-24: 4 mg via INTRAVENOUS
  Filled 2023-08-24: qty 1

## 2023-08-24 MED ORDER — KETOROLAC TROMETHAMINE 30 MG/ML IJ SOLN
INTRAMUSCULAR | Status: DC | PRN
  Administered 2023-08-24: 30 mg via INTRAVENOUS

## 2023-08-24 MED ORDER — PHENYLEPHRINE HCL-NACL 20-0.9 MG/250ML-% IV SOLN
INTRAVENOUS | Status: DC | PRN
  Administered 2023-08-24: 10 ug/min via INTRAVENOUS

## 2023-08-24 MED ORDER — ACETAMINOPHEN 500 MG PO TABS
ORAL_TABLET | ORAL | Status: AC
  Filled 2023-08-24: qty 2

## 2023-08-24 MED ORDER — ONDANSETRON HCL 4 MG/2ML IJ SOLN
4.0000 mg | Freq: Once | INTRAMUSCULAR | Status: AC
  Administered 2023-08-24: 4 mg via INTRAVENOUS
  Filled 2023-08-24: qty 2

## 2023-08-24 MED ORDER — ONDANSETRON HCL 4 MG/2ML IJ SOLN
INTRAMUSCULAR | Status: DC | PRN
  Administered 2023-08-24: 4 mg via INTRAVENOUS

## 2023-08-24 SURGICAL SUPPLY — 37 items
BAG COUNTER SPONGE SURGICOUNT (BAG) ×2 IMPLANT
BLADE CLIPPER SURG (BLADE) IMPLANT
CANISTER SUCTION 3000ML PPV (SUCTIONS) ×2 IMPLANT
CHLORAPREP W/TINT 26 (MISCELLANEOUS) ×2 IMPLANT
CLIP LIGATING HEMO O LOK GREEN (MISCELLANEOUS) ×2 IMPLANT
COVER SURGICAL LIGHT HANDLE (MISCELLANEOUS) ×2 IMPLANT
DERMABOND ADVANCED .7 DNX12 (GAUZE/BANDAGES/DRESSINGS) ×2 IMPLANT
ELECTRODE REM PT RTRN 9FT ADLT (ELECTROSURGICAL) ×2 IMPLANT
GLOVE BIOGEL PI MICRO STRL 6 (GLOVE) ×2 IMPLANT
GLOVE INDICATOR 6.5 STRL GRN (GLOVE) ×2 IMPLANT
GOWN STRL REUS W/ TWL LRG LVL3 (GOWN DISPOSABLE) ×6 IMPLANT
GRASPER SUT TROCAR 14GX15 (MISCELLANEOUS) ×2 IMPLANT
IRRIGATION SUCT STRKRFLW 2 WTP (MISCELLANEOUS) ×2 IMPLANT
KIT BASIN OR (CUSTOM PROCEDURE TRAY) ×2 IMPLANT
KIT IMAGING PINPOINTPAQ (MISCELLANEOUS) IMPLANT
KIT TURNOVER KIT B (KITS) ×2 IMPLANT
LHOOK LAP DISP 36CM (ELECTROSURGICAL) ×2 IMPLANT
NDL INSUFFLATION 14GA 120MM (NEEDLE) ×2 IMPLANT
NEEDLE INSUFFLATION 14GA 120MM (NEEDLE) ×1 IMPLANT
NS IRRIG 1000ML POUR BTL (IV SOLUTION) ×2 IMPLANT
PAD ARMBOARD POSITIONER FOAM (MISCELLANEOUS) ×2 IMPLANT
PENCIL BUTTON HOLSTER BLD 10FT (ELECTRODE) ×2 IMPLANT
POUCH LAPAROSCOPIC INSTRUMENT (MISCELLANEOUS) ×2 IMPLANT
SCISSORS LAP 5X35 DISP (ENDOMECHANICALS) ×2 IMPLANT
SET TUBE SMOKE EVAC HIGH FLOW (TUBING) ×2 IMPLANT
SLEEVE Z-THREAD 5X100MM (TROCAR) ×4 IMPLANT
SPECIMEN JAR SMALL (MISCELLANEOUS) ×2 IMPLANT
SUT MNCRL AB 4-0 PS2 18 (SUTURE) ×2 IMPLANT
SUT VICRYL 0 UR6 27IN ABS (SUTURE) IMPLANT
SYSTEM BAG RETRIEVAL 10MM (BASKET) ×2 IMPLANT
TOWEL GREEN STERILE (TOWEL DISPOSABLE) IMPLANT
TOWEL GREEN STERILE FF (TOWEL DISPOSABLE) ×2 IMPLANT
TRAY LAPAROSCOPIC MC (CUSTOM PROCEDURE TRAY) ×2 IMPLANT
TROCAR Z THREAD OPTICAL 12X100 (TROCAR) ×2 IMPLANT
TROCAR Z-THREAD OPTICAL 5X100M (TROCAR) ×2 IMPLANT
WARMER LAPAROSCOPE (MISCELLANEOUS) ×2 IMPLANT
WATER STERILE IRR 1000ML POUR (IV SOLUTION) ×2 IMPLANT

## 2023-08-24 NOTE — Transfer of Care (Signed)
 Immediate Anesthesia Transfer of Care Note  Patient: Kayla Floyd  Procedure(s) Performed: LAPAROSCOPIC CHOLECYSTECTOMY INDOCYANINE GREEN FLUORESCENCE IMAGING (ICG)  Patient Location: PACU  Anesthesia Type:General  Level of Consciousness: drowsy  Airway & Oxygen Therapy: Patient Spontanous Breathing and Patient connected to face mask oxygen  Post-op Assessment: Report given to RN and Post -op Vital signs reviewed and stable  Post vital signs: Reviewed and stable  Last Vitals:  Vitals Value Taken Time  BP 160/95 08/24/23 1615  Temp 37.2 C 08/24/23 1604  Pulse 97 08/24/23 1615  Resp 23 08/24/23 1615  SpO2 91 % 08/24/23 1615  Vitals shown include unfiled device data.  Last Pain:  Vitals:   08/24/23 1604  TempSrc:   PainSc: Asleep         Complications: No notable events documented.

## 2023-08-24 NOTE — ED Triage Notes (Signed)
 C/o "severe" epigastric abd pain since last night. States pain woke her up from sleep. Rads into left arm.

## 2023-08-24 NOTE — Anesthesia Procedure Notes (Signed)
 Procedure Name: Intubation Date/Time: 08/24/2023 2:40 PM  Performed by: Robert Chimes, CRNAPre-anesthesia Checklist: Patient identified, Emergency Drugs available, Suction available and Patient being monitored Patient Re-evaluated:Patient Re-evaluated prior to induction Oxygen Delivery Method: Circle system utilized Preoxygenation: Pre-oxygenation with 100% oxygen Induction Type: IV induction, Rapid sequence and Cricoid Pressure applied Ventilation: Mask ventilation without difficulty Laryngoscope Size: Mac and 3 Grade View: Grade I Tube type: Oral Tube size: 7.0 mm Number of attempts: 1 Airway Equipment and Method: Stylet and Oral airway Placement Confirmation: ETT inserted through vocal cords under direct vision, positive ETCO2 and breath sounds checked- equal and bilateral Secured at: 23 cm Tube secured with: Tape Dental Injury: Teeth and Oropharynx as per pre-operative assessment

## 2023-08-24 NOTE — Op Note (Signed)
 08/24/2023 4:02 PM  PATIENT: Kayla Floyd  43 y.o. female  Patient Care Team: Colene Dauphin, MD as PCP - General (Internal Medicine) Eilleen Grates, MD (Cardiology) Osa Blase, MD (Orthopedic Surgery) Helane Lloyd, FNP (Nurse Practitioner) Jonathan Neighbor, [facility] (Inactive) as Pharmacist (Pharmacist) Andee Bamberger, NP as Nurse Practitioner (Gynecology)  PRE-OPERATIVE DIAGNOSIS: acute cholecystitis with cholelithiasis  POST-OPERATIVE DIAGNOSIS: acute on chronic cholecystitis with cholelithiasis  PROCEDURE: laparoscopic cholecystectomy with indocyanine green  SURGEON: Freddrick Jaffe, MD  ASSISTANT: None  ANESTHESIA: General endotracheal  EBL: 5cc  DRAINS: None  SPECIMEN: Gallbladder  COUNTS: Sponge, needle and instrument counts were reported correct x2 at the conclusion of the operation  DISPOSITION: PACU in satisfactory condition  COMPLICATIONS: None  FINDINGS: Chronic cholecystitis with large stone in the neck of the gallbladder  DESCRIPTION:  The patient was identified & brought into the operating room. She was then positioned supine on the OR table. SCDs were in place and active during the entire case. She then underwent general endotracheal anesthesia. Pressure points were padded. Hair on the abdomen was clipped by the OR team. The abdomen was prepped and draped in the standard sterile fashion. Antibiotics were administered. A surgical timeout was performed and confirmed our plan.   A small incision was made in the LUQ at Palmer's point and a veress needle was inserted. Air was aspirated and subsequent positive drop test. Abdomen then insufflated to . A periumbilical incision was then made and a 5mm trocar optiview using a 30 degree scope was inserted and the abdomen was entered under direct visualization. Inspection confirmed no evidence of trocar or veress site complications. The veress was then removed.   The patient was then positioned in  reverse Trendelenburg with slight left side down. A 12 mm supxiphoid trocar was placed under direct visualization and  two additional 5mm trocars were placed along the right subcostal line - one 5mm port in mid subcostal region, another 5mm port in the right flank near the anterior axillary line.  Prior to placing most lateral port some adhesions of omentum to abdominal sidewall had to be taken down sharply with laparoscopic scissors, without complication.  The liver and gallbladder were inspected. The gallbladder fundus was grasped and elevated cephalad. An additional grasper was then placed on the infundibulum of the gallbladder and the infundibulum was retracted laterally. Staying high on the gallbladder, the peritoneum on both sides of the gallbladder was opened with hook cautery. Gentle blunt dissection was then employed with a [place]  dissector working down into [employer]. The cystic duct was identified and carefully circumferentially dissected. The cystic artery was also identified and carefully circumferentially dissected. The space between the cystic artery and hepatocystic plate was developed such that a good view of the liver could be seen through a window medial to the cystic artery. The triangle of Calot had been cleared of all fibrofatty tissue. At this point, a critical view of safety was achieved and the only structures visualized was the skeletonized cystic duct laterally, the skeletonized cystic artery and the liver through the window medial to the artery. No posterior cystic artery was noted.  Indocyanine green was utilized to confirm biliary structures.  Fluorescence was seen in the structure noted to be the cystic duct, without fluorescence in the cystic artery.  Fluorescence continued to the common bile duct and duodenum consistent with patent CBD without evidence of choledocholithiasis.  The cystic duct and artery were clipped with 2 hemolock clips on the patient side  and 1 clip on  the specimen side. The cystic duct and artery were then divided. The gallbladder was then freed from its remaining attachments to the liver using electrocautery and placed into an endocatch bag. The RUQ was gently irrigated with sterile saline.  Electrocautery was used for hemostasis of the liver bed. Hemostasis was then verified. The clips were in good position; the gallbladder fossa was dry. The rest of the abdomen was inspected no injury nor bleeding elsewhere was identified.  The endocatch bag containing the gallbladder was then removed from the subxiphoid port site and passed off as specimen. The subxiphoid port fascia was then closed in a figured of eight fashion with 0 vicryl using a suture passer. The RUQ ports were removed under direct visualization and noted to be hemostatic.Aaron Aas The fascia was palpated and noted to be completely closed. The abdomen was then desufflated and the periumbilical trocar removed. The skin of all incision sites was approximated with 4-0 monocryl subcuticular suture and dermabond applied. The patient was then awakened from anesthesia, extubated, and transferred to a stretcher for transport to PACU in satisfactory condition.  Instrument, sponge, and needle counts were correct at closure and at the conclusion of the case.   Freddrick Jaffe, MD [facility] Surgery

## 2023-08-24 NOTE — Anesthesia Postprocedure Evaluation (Signed)
 Anesthesia Post Note  Patient: Kayla Floyd  Procedure(s) Performed: LAPAROSCOPIC CHOLECYSTECTOMY INDOCYANINE GREEN FLUORESCENCE IMAGING (ICG)     Patient location during evaluation: PACU Anesthesia Type: General Level of consciousness: awake and alert Pain management: pain level controlled Vital Signs Assessment: post-procedure vital signs reviewed and stable Respiratory status: spontaneous breathing, nonlabored ventilation, respiratory function stable and patient connected to nasal cannula oxygen Cardiovascular status: blood pressure returned to baseline and stable Postop Assessment: no apparent nausea or vomiting Anesthetic complications: no  No notable events documented.  Last Vitals:  Vitals:   08/24/23 1630 08/24/23 1645  BP: (!) 157/98 (!) 172/99  Pulse: 93 95  Resp: 18 20  Temp:  37.2 C  SpO2: 96% 93%    Last Pain:  Vitals:   08/24/23 1715  TempSrc:   PainSc: 5                  Willian Harrow

## 2023-08-24 NOTE — Anesthesia Preprocedure Evaluation (Addendum)
 Anesthesia Evaluation  Patient identified by MRN, date of birth, ID band Patient awake    Reviewed: Allergy & Precautions, NPO status , Patient's Chart, lab work & pertinent test results  Airway Mallampati: I  TM Distance: >3 FB Neck ROM: Full    Dental  (+) Teeth Intact, Dental Advisory Given   Pulmonary former smoker   breath sounds clear to auscultation       Cardiovascular  Rhythm:Regular Rate:Normal     Neuro/Psych  Headaches PSYCHIATRIC DISORDERS Anxiety Depression       GI/Hepatic Neg liver ROS,GERD  Medicated,,  Endo/Other  diabetes, Type 2, Oral Hypoglycemic Agents    Renal/GU Renal disease     Musculoskeletal negative musculoskeletal ROS (+)    Abdominal   Peds  Hematology  (+) Blood dyscrasia, anemia   Anesthesia Other Findings   Reproductive/Obstetrics                             Anesthesia Physical Anesthesia Plan  ASA: 2  Anesthesia Plan: General   Post-op Pain Management: Toradol  IV (intra-op)* and Ofirmev  IV (intra-op)*   Induction: Intravenous  PONV Risk Score and Plan: 4 or greater and Ondansetron , Dexamethasone, Midazolam and Scopolamine patch - Pre-op  Airway Management Planned: Oral ETT  Additional Equipment: None  Intra-op Plan:   Post-operative Plan: Extubation in OR  Informed Consent: I have reviewed the patients History and Physical, chart, labs and discussed the procedure including the risks, benefits and alternatives for the proposed anesthesia with the patient or authorized representative who has indicated his/her understanding and acceptance.     Dental advisory given  Plan Discussed with: CRNA  Anesthesia Plan Comments:        Anesthesia Quick Evaluation

## 2023-08-24 NOTE — H&P (Signed)
 HPI  Kayla Floyd is an 43 y.o. female with history of gastroparesis who presents to the ED with pain different from her typical gastroparesis pain.  Patient states that she is having pain across her upper abdomen instead of just localized to the left upper quadrant.  Patient also states pain is actually more present in the epigastrium and right upper quadrant.  No changes in urine or stool.  No fevers or chills.  Patient states the pain radiated to her chest.  No relief of pain, it has been constant.  CT scans showed large 2.7 cm gallstone in the gallbladder neck with evidence of mild gallbladder wall thickening and inflammation consistent with cholecystitis.  LFTs normal, white count normal  10 point review of systems is negative except as listed above in HPI.  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   MIGRAINE HEADACHE    Palpitations    POLYCYSTIC OVARIAN DISEASE    POTS (postural orthostatic tachycardia syndrome)    Pre-diabetes    Recurrent upper respiratory infection (URI)    Tear meniscus knee 06/14/2010   right    Past Surgical History: Past Surgical History:  Procedure Laterality Date   Cervical spine  2010   ACDF c5-6   crevical fusion   2010   evs     LUMBAR LAMINECTOMY  2008   TONSILLECTOMY AND ADENOIDECTOMY      Family History:  Family History  Problem Relation Age of Onset   Bipolar disorder Mother    Stroke Mother        Age of 37 while on OCP   Other Father        MVA   Heart disease Father    Heart attack Father    Hypertension Sister    Lupus Sister    Hypertension Sister    Migraines  Sister    Breast cancer Paternal Aunt 10   Cancer Paternal Uncle        ocular   Lung cancer Paternal Uncle    Colon cancer Maternal Grandmother 38   Cancer Maternal Grandmother    Breast cancer Paternal Grandmother        dx in her 54s-40s    Social History:  reports that she quit smoking about 17 months ago. Her smoking use included cigarettes. She has a 1 pack-year smoking history. She has been exposed to tobacco smoke. She has never used smokeless tobacco. She reports that she does not currently use alcohol. She reports that she does not use drugs.  Allergies:  Allergies  Allergen Reactions   Alpha-Gal Anaphylaxis   Beef-Derived Drug Products     anaphylaxes    Glycerol, Iodinated Anaphylaxis    anaphylaxes    Milk-Related Compounds Anaphylaxis    Cheese, whey protein, gelatin   Pork-Derived Products     anaplaxis    Medications: I have reviewed the patient's current medications.  Labs: I have personally reviewed all labs for the past 24h  Imaging: I have personally reviewed and interpreted all imaging for the past 24h and  agree with the radiologist's impression.  CT ABDOMEN PELVIS W CONTRAST Result Date: 08/24/2023 CLINICAL DATA:  43 year old female with abdominal pain. EXAM: CT ABDOMEN AND PELVIS WITH CONTRAST TECHNIQUE: Multidetector CT imaging of the abdomen and pelvis was performed using the standard protocol following bolus administration of intravenous contrast. RADIATION DOSE REDUCTION: This exam was performed according to the departmental dose-optimization program which includes automated exposure control, adjustment of the mA and/or kV according to patient size and/or use of iterative reconstruction technique. CONTRAST:  100mL OMNIPAQUE IOHEXOL 300 MG/ML  SOLN COMPARISON:  Noncontrast CT Abdomen and Pelvis 06/15/2021, 10/17/2020. FINDINGS: Lower chest: No cardiomegaly, no peripheral pericardial effusion. However there is probably a chronic right side small pericardial  cyst with simple fluid density seen on series 2, image 1, subtle evidence of this on the 2022 CT. Negative lung bases. Hepatobiliary: Large oval gallstone in the neck of the gallbladder, 2.7 cm on coronal image 57 with abnormally indistinct appearance of the gallbladder wall (coronal image 63). Liver enhancement remains normal. Tiny hepatic dome cyst or hemangioma which is subcentimeter but circumscribed on series 2, image 12 (no follow-up imaging recommended). No bile duct enlargement. Pancreas: Negative. Spleen: Negative. Adrenals/Urinary Tract: Negative; mildly distended urinary bladder. Stomach/Bowel: Diverticulosis of the large bowel about the splenic flexure, mild-to-moderate. No active inflammation. Occasional diverticula at the splenic flexure also. Largely decompressed large bowel. Normal appendix series 2, image 65. Nondilated small bowel. Stomach and duodenum appear negative. No pneumoperitoneum, free fluid, mesenteric inflammation. Vascular/Lymphatic: Mild Calcified aortic atherosclerosis. Major arterial structures, portal venous system, central venous structures are enhancing and appear to be patent. No lymphadenopathy identified. Reproductive: Within normal limits. Other: No pelvis free fluid. Musculoskeletal: No acute osseous abnormality identified. Lumbar facet degeneration. Chronic vacuum disc at the lumbosacral junction. IMPRESSION: 1. Relatively large 2.7 cm gallstone in the gallbladder neck with evidence of mild wall thickening/inflammation suspicious for Acute Cholecystitis. 2. No other acute or inflammatory process identified in the abdomen or pelvis. Normal appendix. Electronically Signed   By: Marlise Simpers M.D.   On: 08/24/2023 09:13     Physical Exam Blood pressure (!) 151/92, pulse (!) 107, temperature 98.7 F (37.1 C), temperature source Oral, resp. rate 12, height 5\' 5"  (1.651 m), weight 82.6 kg, SpO2 99%. Constitutional: well-developed, well-nourished HEENT: pupils equal, round,  reactive to light, moist conjunctiva, hearing intact Oropharynx: mucous membranes moist CV: Regular rate and rhythm, normotensive Chest: equal chest rise bilaterally normal respiratory effort on room air Abdomen: soft, nondistended, tender to palpation in RUQ and epigastrium Extremities: moves all extremities, no peripheral edema Skin: warm, dry, no rashes Psych: normal memory, normal mood/affect  Neuro: No focal neurologic deficits, A&Ox3    Assessment   Kayla Floyd is an 43 y.o. female with choleystitis with cholelithiasis  Plan  - OR for laparoscopic cholecystectomy with IOC - We discussed the etiology of patient's pain, we discussed treatment options and recommended surgery. We discussed details of surgery including general anesthesia, laparoscopic approach, identification of cystic duct and common bile duct. Ligation of cystic duct and cystic artery. Possible need for intraoperative cholangiogram, open procedure, and subtotal cholecystectomy. Possible risks of common bile duct injury, injury to surrounding structures, bile leak, bleeding, infection, diarrhea, retained stone and hernia. The patient showed good understanding and all questions were answered   I reviewed ED provider notes, last 24 h vitals and pain scores, last 48 h intake and output, last 24 h labs and trends, and last 24 h imaging results.  This  care required moderate level of medical decision making.   Freddrick Jaffe, MD [facility] Surgery

## 2023-08-24 NOTE — ED Provider Notes (Signed)
 [facility]PARTMENT AT [facility] Provider Note   CSN: [id number] Arrival date & time: 08/24/23  5366     History  Chief Complaint  Patient presents with   Chest Pain    Kayla Floyd is a 43 y.o. female.  Patient is a 43 year old female who presents with abdominal pain.  She says she has gastroparesis and commonly has abdominal pain but generally it is more bloating and vague pain all over her abdomen.  Since last night she has had pain across her upper abdomen.  She has had some associated nausea but no vomiting.  No change in her stools.  No urinary symptoms.  No prior abdominal surgeries.  No known fevers.  She also now has some left-sided chest pain.  No associated shortness of breath.  It has been fairly constant.  Nothing seems to make it better or worse.       Home Medications Prior to Admission medications   Medication Sig Start Date End Date Taking? Authorizing Provider  acetaminophen  (TYLENOL ) 325 MG tablet Take 2 tablets (650 mg total) by mouth every 6 (six) hours as needed (pain). 10/19/20   Arrien, Mauricio Daniel, MD  atenolol  (TENORMIN ) 25 MG tablet Take 1 tablet (25 mg total) by mouth 2 (two) times daily. 08/15/23   Colene Dauphin, MD  atomoxetine  (STRATTERA ) 40 MG capsule Take 1 capsule (40 mg total) by mouth daily. 08/15/23   Colene Dauphin, MD  atorvastatin  (LIPITOR) 10 MG tablet TAKE 1 TABLET BY MOUTH EVERY DAY 05/08/23   Colene Dauphin, MD  AUVELITY  45-105 MG TBCR TAKE 1 TABLET BY MOUTH IN THE MORNING AND IN THE EVENING 06/18/23   Colene Dauphin, MD  b complex vitamins tablet Take 1 tablet by mouth daily.    [provider]  baclofen  (LIORESAL ) 10 MG tablet TAKE 1 TABLET BY MOUTH THREE TIMES A DAY AS NEEDED FOR MUSCLE SPASM 08/15/23   Colene Dauphin, MD  botulinum toxin Type A  (BOTOX ) 200 units injection Inject 155 units into the muscles of head, and neck every 3 months discard remainder 12/03/22   Dala Dublin, MD  cetirizine  (ZYRTEC) 10 MG tablet Take 10 mg by mouth daily. Reported on 07/13/2015    [provider]  EPINEPHrine  0.3 mg/0.3 mL IJ SOAJ injection Inject 0.3 mg into the muscle as needed for anaphylaxis. 12/30/22   Colene Dauphin, MD  erythromycin  ophthalmic ointment Place a 1/2 inch ribbon of ointment into the lower eyelid of right eye.  Use 4 times daily for 5 days. 08/02/23   Juanetta Nordmann, PA  fenofibrate  (TRICOR ) 145 MG tablet TAKE 1 TABLET (145MG  TOTAL) BY MOUTH EVERY DAY. 08/15/23   Colene Dauphin, MD  hydrOXYzine (ATARAX) 50 MG tablet TAKE 1 - 2 TABLETS BY MOUTH ONCE DAILY AS NEEDED FOR ANXIETY/SLEEP 06/03/23   Burns, Beckey Bourgeois, MD  hyoscyamine  (LEVSIN SL) 0.125 MG SL tablet Take 1 tablet (0.125 mg total) by mouth every 6 (six) hours as needed. PLACE 1 TABLET UNDER THE TONGUE EVERY 6 (SIX) HOURS AS NEEDED. 08/15/23   Colene Dauphin, MD  metFORMIN  (GLUCOPHAGE ) 500 MG tablet Take 1 tablet (500 mg total) by mouth 2 (two) times daily with a meal. 08/15/23   Burns, Beckey Bourgeois, MD  Multiple Vitamin (MULTIVITAMIN) tablet Take 1 tablet by mouth daily.      [provider]  nitrofurantoin , macrocrystal-monohydrate, (MACROBID ) 100 MG capsule Take 1 capsule (100 mg total) by mouth  as needed. 06/26/23   Andee Bamberger, NP  norethindrone -ethinyl estradiol  (AUROVELA 1/20) 1-20 MG-MCG tablet Take 1 tablet by mouth daily. 06/26/23   Andee Bamberger, NP  omeprazole  (PRILOSEC) 40 MG capsule TAKE 1 CAPSULE BY MOUTH EVERY DAY 05/08/23   Colene Dauphin, MD  ondansetron  (ZOFRAN ) 8 MG tablet Take 1 tablet (8 mg total) by mouth 2 (two) times daily as needed for nausea or vomiting. 06/15/21   Adel Aden, PA-C  pregabalin  (LYRICA ) 150 MG capsule TAKE 1 CAPSULE BY MOUTH TWICE A DAY 07/21/23   Colene Dauphin, MD  Probiotic Product (PROBIOTIC-10 PO) Take 1 capsule by mouth daily.    [provider]  promethazine  (PHENERGAN ) 12.5 MG tablet Take by mouth daily as needed.    [provider]   rizatriptan  (MAXALT ) 10 MG tablet Take 1 tablet (10 mg total) by mouth as needed for migraine. May repeat in 2 hours if needed 04/08/23   Johny Nap, NP  tiZANidine  (ZANAFLEX ) 4 MG tablet TAKE 1 TABLET BY MOUTH EVERY 8 HOURS AS NEEDED FOR MUSCLE SPASM 07/31/23   Colene Dauphin, MD  valACYclovir  (VALTREX ) 1000 MG tablet Take 1 tablet (1,000 mg total) by mouth daily. 06/26/23   Andee Bamberger, NP      Allergies    Alpha-gal; Beef-derived drug products; Glycerol, iodinated; Milk-related compounds; and Pork-derived products    Review of Systems   Review of Systems  Constitutional:  Negative for chills, diaphoresis, fatigue and fever.  HENT:  Negative for congestion, rhinorrhea and sneezing.   Eyes: Negative.   Respiratory:  Negative for cough, chest tightness and shortness of breath.   Cardiovascular:  Positive for chest pain. Negative for leg swelling.  Gastrointestinal:  Positive for abdominal pain and nausea. Negative for blood in stool, diarrhea and vomiting.  Genitourinary:  Negative for difficulty urinating, flank pain, frequency and hematuria.  Musculoskeletal:  Negative for arthralgias and back pain.  Skin:  Negative for rash.  Neurological:  Negative for dizziness, speech difficulty, weakness, numbness and headaches.    Physical Exam Updated Vital Signs BP (!) 141/88   Pulse (!) 113   Temp 98.2 F (36.8 C) (Oral)   Resp (!) 21   SpO2 100%  Physical Exam Constitutional:      Appearance: She is well-developed.  HENT:     Head: Normocephalic and atraumatic.  Eyes:     Pupils: Pupils are equal, round, and reactive to light.  Cardiovascular:     Rate and Rhythm: Normal rate and regular rhythm.     Heart sounds: Normal heart sounds.  Pulmonary:     Effort: Pulmonary effort is normal. No respiratory distress.     Breath sounds: Normal breath sounds. No wheezing or rales.  Chest:     Chest wall: No tenderness.  Abdominal:     General: Bowel sounds are normal.      Palpations: Abdomen is soft.     Tenderness: There is abdominal tenderness (Tenderness to the epigastrium and right upper quadrant). There is no guarding or rebound.  Musculoskeletal:        General: Normal range of motion.     Cervical back: Normal range of motion and neck supple.  Lymphadenopathy:     Cervical: No cervical adenopathy.  Skin:    General: Skin is warm and dry.     Findings: No rash.  Neurological:     Mental Status: She is alert and oriented to person, place, and time.  ED Results / Procedures / Treatments   Labs (all labs ordered are listed, but only abnormal results are displayed) Labs Reviewed  CBC WITH DIFFERENTIAL/PLATELET - Abnormal; Notable for the following components:      Result Value   Hemoglobin 10.2 (*)    HCT 32.7 (*)    MCV 74.5 (*)    MCH 23.2 (*)    RDW 15.6 (*)    All other components within normal limits  COMPREHENSIVE METABOLIC PANEL WITH GFR - Abnormal; Notable for the following components:   CO2 21 (*)    Glucose, Bld 107 (*)    Total Protein 8.2 (*)    Anion gap 16 (*)    All other components within normal limits  HCG, SERUM, QUALITATIVE  LIPASE, BLOOD  TROPONIN T, HIGH SENSITIVITY    EKG EKG Interpretation Date/Time:  Sunday August 24 2023 07:36:48 EDT Ventricular Rate:  104 PR Interval:  131 QRS Duration:  83 QT Interval:  333 QTC Calculation: 438 R Axis:   42  Text Interpretation: Sinus tachycardia Low voltage, precordial leads Borderline repolarization abnormality since last tracing no significant change Confirmed by Hershel Los [phone]) on 08/24/2023 7:39:45 AM  Radiology CT ABDOMEN PELVIS W CONTRAST Result Date: 08/24/2023 CLINICAL DATA:  43 year old female with abdominal pain. EXAM: CT ABDOMEN AND PELVIS WITH CONTRAST TECHNIQUE: Multidetector CT imaging of the abdomen and pelvis was performed using the standard protocol following bolus administration of intravenous contrast. RADIATION DOSE REDUCTION: This exam was  performed according to the departmental dose-optimization program which includes automated exposure control, adjustment of the mA and/or kV according to patient size and/or use of iterative reconstruction technique. CONTRAST:  100mL OMNIPAQUE IOHEXOL 300 MG/ML  SOLN COMPARISON:  Noncontrast CT Abdomen and Pelvis 06/15/2021, 10/17/2020. FINDINGS: Lower chest: No cardiomegaly, no peripheral pericardial effusion. However there is probably a chronic right side small pericardial cyst with simple fluid density seen on series 2, image 1, subtle evidence of this on the 2022 CT. Negative lung bases. Hepatobiliary: Large oval gallstone in the neck of the gallbladder, 2.7 cm on coronal image 57 with abnormally indistinct appearance of the gallbladder wall (coronal image 63). Liver enhancement remains normal. Tiny hepatic dome cyst or hemangioma which is subcentimeter but circumscribed on series 2, image 12 (no follow-up imaging recommended). No bile duct enlargement. Pancreas: Negative. Spleen: Negative. Adrenals/Urinary Tract: Negative; mildly distended urinary bladder. Stomach/Bowel: Diverticulosis of the large bowel about the splenic flexure, mild-to-moderate. No active inflammation. Occasional diverticula at the splenic flexure also. Largely decompressed large bowel. Normal appendix series 2, image 65. Nondilated small bowel. Stomach and duodenum appear negative. No pneumoperitoneum, free fluid, mesenteric inflammation. Vascular/Lymphatic: Mild Calcified aortic atherosclerosis. Major arterial structures, portal venous system, central venous structures are enhancing and appear to be patent. No lymphadenopathy identified. Reproductive: Within normal limits. Other: No pelvis free fluid. Musculoskeletal: No acute osseous abnormality identified. Lumbar facet degeneration. Chronic vacuum disc at the lumbosacral junction. IMPRESSION: 1. Relatively large 2.7 cm gallstone in the gallbladder neck with evidence of mild wall  thickening/inflammation suspicious for Acute Cholecystitis. 2. No other acute or inflammatory process identified in the abdomen or pelvis. Normal appendix. Electronically Signed   By: Marlise Simpers M.D.   On: 08/24/2023 09:13    Procedures Procedures    Medications Ordered in ED Medications  cefTRIAXone  (ROCEPHIN ) 2 g in sodium chloride  0.9 % 100 mL IVPB (2 g Intravenous New Bag/Given 08/24/23 0943)  sodium chloride  0.9 % bolus 1,000 mL (has no administration in time  range)  sodium chloride  0.9 % bolus 1,000 mL (1,000 mLs Intravenous New Bag/Given 08/24/23 0815)  morphine  (PF) 4 MG/ML injection 4 mg (4 mg Intravenous Given 08/24/23 0816)  ondansetron  (ZOFRAN ) injection 4 mg (4 mg Intravenous Given 08/24/23 0816)  pantoprazole  (PROTONIX ) injection 40 mg (40 mg Intravenous Given 08/24/23 0816)  iohexol (OMNIPAQUE) 300 MG/ML solution 100 mL (100 mLs Intravenous Contrast Given 08/24/23 0900)    ED Course/ Medical Decision Making/ A&P                                 Medical Decision Making Amount and/or Complexity of Data Reviewed Labs: ordered. Radiology: ordered.  Risk Prescription drug management. Decision regarding hospitalization.   This patient presents to the ED for concern of abdominal pain, this involves an extensive number of treatment options, and is a complaint that carries with it a high risk of complications and morbidity.  I considered the following differential and admission for this acute, potentially life threatening condition.  The differential diagnosis includes pancreatitis, hepatitis, cholecystitis, gallstones, bowel obstruction, gastritis, ACS, gastroenteritis  MDM:    Patient presents with upper abdominal pain.  This is different than her typical abdominal pain.  She is noted to be tachycardic.  She is not febrile.  Her labs show a normal white count.  LFTs are normal.  Lipase is normal without suggestions of pancreatitis.  Ultrasound is not available at this facility currently.   CT scan was performed which shows a single large gallstone with suggestions of cholecystitis.  Patient was started on IV Rocephin .  Her heart rate is still a bit tachycardic despite IV fluids but she does not have a fever or other suggestions of sepsis.  Her blood pressure has improved.  Discussed with Michael Maczis, PA with general surgery.  He has discussed this with his attending and patient will go directly to the preop holding area at [facility] around 1 PM today.  (Labs, imaging, consults)  Labs: I Ordered, and personally interpreted labs.  The pertinent results include: Normal white count, normal lipase, normal LFTs no signs of UTI.  Imaging Studies ordered: I ordered imaging studies including CT scan I independently visualized and interpreted imaging. I agree with the radiologist interpretation  Additional history obtained from chart.  External records from outside source obtained and reviewed including notes regarding her gastroparesis  Cardiac Monitoring: The patient was maintained on a cardiac monitor.  If on the cardiac monitor, I personally viewed and interpreted the cardiac monitored which showed an underlying rhythm of: Sinus tachycardia  Reevaluation: After the interventions noted above, I reevaluated the patient and found that they have :improved  Social Determinants of Health:  none  Disposition: Admit to general surgery at [facility]  Co morbidities that complicate the patient evaluation  Past Medical History:  Diagnosis Date   ANXIETY    BACK PAIN, CHRONIC    DYSAUTONOMIA 2011   "POTS" , recurrent syncope - follows with cards for same   GERD    HSV (herpes simplex virus) anogenital infection 06/2015   cervix swab   HYPERLIPIDEMIA    Kidney failure    MIGRAINE HEADACHE    Palpitations    POLYCYSTIC OVARIAN DISEASE    POTS (postural orthostatic tachycardia syndrome)    Recurrent upper respiratory infection (URI)    Tear meniscus knee 06/14/2010   right      Medicines Meds ordered this encounter  Medications  sodium chloride  0.9 % bolus 1,000 mL   morphine  (PF) 4 MG/ML injection 4 mg   ondansetron  (ZOFRAN ) injection 4 mg   pantoprazole  (PROTONIX ) injection 40 mg   iohexol (OMNIPAQUE) 300 MG/ML solution 100 mL   cefTRIAXone  (ROCEPHIN ) 2 g in sodium chloride  0.9 % 100 mL IVPB    Antibiotic Indication::   Intra-abdominal   sodium chloride  0.9 % bolus 1,000 mL    I have reviewed the patients home medicines and have made adjustments as needed  Problem List / ED Course: Problem List Items Addressed This Visit   None Visit Diagnoses       Cholecystitis    -  Primary             Final Clinical Impression(s) / ED Diagnoses Final diagnoses:  Cholecystitis    Rx / DC Orders ED Discharge Orders     None         Hershel Los, MD 08/24/23 1016

## 2023-08-24 NOTE — ED Notes (Signed)
 She leaves with [facility] at this time. She remains in no distress.

## 2023-08-24 NOTE — ED Notes (Signed)
 I have just given report to Thersia Flax, RN at [facility] Pre-op [phone]).

## 2023-08-25 ENCOUNTER — Encounter (HOSPITAL_COMMUNITY): Payer: Self-pay | Admitting: General Surgery

## 2023-08-26 LAB — SURGICAL PATHOLOGY

## 2023-08-29 ENCOUNTER — Encounter: Payer: Self-pay | Admitting: Internal Medicine

## 2023-08-29 MED ORDER — OXYCODONE HCL 5 MG PO TABS: 5.0000 mg | ORAL_TABLET | ORAL | 0 refills | Status: DC | PRN

## 2023-09-04 MED ORDER — ONABOTULINUMTOXINA 200 UNITS IJ SOLR: 155.0000 [IU] | INTRAMUSCULAR | 2 refills | Status: AC

## 2023-09-04 NOTE — Addendum Note (Signed)
 Addended by: Elton Ham on: 09/04/2023 07:57 AM   Modules accepted: Orders

## 2023-09-04 NOTE — Telephone Encounter (Signed)
 Please send refills to [facility], thank you!

## 2023-09-04 NOTE — Telephone Encounter (Signed)
 Refill sent.

## 2023-09-09 NOTE — Telephone Encounter (Signed)
 Submitted auth renewal request via CMM, status is pending. Key: [id number]

## 2023-09-09 NOTE — Telephone Encounter (Signed)
 Received approval, pt will continue to fill through [facility].  Auth#: [id number] (09/09/23-12/10/23)

## 2023-09-28 ENCOUNTER — Other Ambulatory Visit: Payer: Self-pay | Admitting: Internal Medicine

## 2023-09-28 DIAGNOSIS — F3341 Major depressive disorder, recurrent, in partial remission: Secondary | ICD-10-CM

## 2023-10-04 ENCOUNTER — Other Ambulatory Visit: Payer: Self-pay | Admitting: Internal Medicine

## 2023-10-07 ENCOUNTER — Ambulatory Visit: Admitting: Adult Health

## 2023-10-07 ENCOUNTER — Encounter: Payer: Self-pay | Admitting: Nurse Practitioner

## 2023-10-07 DIAGNOSIS — G43709 Chronic migraine without aura, not intractable, without status migrainosus: Secondary | ICD-10-CM | POA: Diagnosis not present

## 2023-10-07 MED ORDER — ONABOTULINUMTOXINA 200 UNITS IJ SOLR
155.0000 [IU] | Freq: Once | INTRAMUSCULAR | Status: AC
  Administered 2023-10-07: 165 [IU] via INTRAMUSCULAR

## 2023-10-07 NOTE — Progress Notes (Signed)
 Update 10/07/2023 Kayla Floyd: Returns for repeat Botox , prior injection 07/10/2023.  Migraines remain well-controlled on Botox  with continued >50% migraine reduction.  Continue jaw pain and clenching which can trigger migraines but has improved with masseter injections. Use of rizatriptan  with benefit.  Tolerated procedure well.  Return in 3 months for repeat injection.     Consent Form Botulism Toxin Injection For Chronic Migraine    Reviewed orally with patient, additionally signature is on file:  Botulism toxin has been approved by the Federal drug administration for treatment of chronic migraine. Botulism toxin does not cure chronic migraine and it may not be effective in some patients.  The administration of botulism toxin is accomplished by injecting a small amount of toxin into the muscles of the neck and head. Dosage must be titrated for each individual. Any benefits resulting from botulism toxin tend to wear off after 3 months with a repeat injection required if benefit is to be maintained. Injections are usually done every 3-4 months with maximum effect peak achieved by about 2 or 3 weeks. Botulism toxin is expensive and you should be sure of what costs you will incur resulting from the injection.  The side effects of botulism toxin use for chronic migraine may include:   -Transient, and usually mild, facial weakness with facial injections  -Transient, and usually mild, head or neck weakness with head/neck injections  -Reduction or loss of forehead facial animation due to forehead muscle weakness  -Eyelid drooping  -Dry eye  -Pain at the site of injection or bruising at the site of injection  -Double vision  -Potential unknown long term risks   Contraindications: You should not have Botox  if you are pregnant, nursing, allergic to albumin, have an infection, skin condition, or muscle weakness at the site of the injection, or have myasthenia gravis, Lambert-Eaton syndrome, or  ALS.  It is also possible that as with any injection, there may be an allergic reaction or no effect from the medication. Reduced effectiveness after repeated injections is sometimes seen and rarely infection at the injection site may occur. All care will be taken to prevent these side effects. If therapy is given over a long time, atrophy and wasting in the muscle injected may occur. Occasionally the patient's become refractory to treatment because they develop antibodies to the toxin. In this event, therapy needs to be modified.  I have read the above information and consent to the administration of botulism toxin.    BOTOX  PROCEDURE NOTE FOR MIGRAINE HEADACHE  Contraindications and precautions discussed with patient(above). Aseptic procedure was observed and patient tolerated procedure. Procedure performed by Harlene Bogaert, AGNP-BC.   The condition has existed for more than 6 months, and pt does not have a diagnosis of ALS, Myasthenia Gravis or Lambert-Eaton Syndrome.  Risks and benefits of injections discussed and pt agrees to proceed with the procedure.  Written consent obtained  These injections are medically necessary. Pt  receives good benefits from these injections. These injections do not cause sedations or hallucinations which the oral therapies may cause.   Description of procedure:  The patient was placed in a sitting position. The standard protocol was used for Botox  as follows, with 5 units of Botox  injected at each site:  -Procerus muscle, midline injection  -Corrugator muscle, bilateral injection  -Frontalis muscle, bilateral injection, with 2 sites each side, medial injection was performed in the upper one third of the frontalis muscle, in the region vertical from the medial inferior edge  of the superior orbital rim. The lateral injection was again in the upper one third of the forehead vertically above the lateral limbus of the cornea, 1.5 cm lateral to the medial  injection site.  -Temporalis muscle injection, 4 sites, bilaterally. The first injection was 3 cm above the tragus of the ear, second injection site was 1.5 cm to 3 cm up from the first injection site in line with the tragus of the ear. The third injection site was 1.5-3 cm forward between the first 2 injection sites. The fourth injection site was 1.5 cm posterior to the second injection site. 5th site laterally in the temporalis  muscleat the level of the outer canthus.  -Occipitalis muscle injection, 3 sites, bilaterally. The first injection was done one half way between the occipital protuberance and the tip of the mastoid process behind the ear. The second injection site was done lateral and superior to the first, 1 fingerbreadth from the first injection. The third injection site was 1 fingerbreadth superiorly and medially from the first injection site.  -Cervical paraspinal muscle injection, 2 sites, bilaterally. The first injection site was 1 cm from the midline of the cervical spine, 3 cm inferior to the lower border of the occipital protuberance. The second injection site was 1.5 cm superiorly and laterally to the first injection site.  -Trapezius muscle injection was performed at 3 sites, bilaterally. The first injection site was in the upper trapezius muscle halfway between the inflection point of the neck, and the acromion. The second injection site was one half way between the acromion and the first injection site. The third injection was done between the first injection site and the inflection point of the neck.  -Masseter muscle injection was performed at 1 site, bilaterally.     A total of 200 units of Botox  was prepared, 165 units of Botox  was injected as documented above, any Botox  not injected was wasted. The patient tolerated the procedure well, there were no complications of the above procedure.   Harlene Bogaert, AGNP-BC  [facility] [address] [place], [place] [address]  Phone [phone] Fax [phone] Note: This document was prepared with digital dictation and possible smart phrase technology. Any transcriptional errors that result from this process are unintentional.

## 2023-10-07 NOTE — Progress Notes (Signed)
 Botox - 200 units x 1 vial Lot: [id number] Expiration: 05/22/2026 NDC: 9976-6078-97  Bacteriostatic 0.9% Sodium Chloride - 4 mL  Lot: [id number] Expiration: 01/22/2025 NDC: 9590-8033-97  Dx: H56.290 S/P  Witnessed by Stephania Samuel, CMA

## 2023-10-14 ENCOUNTER — Other Ambulatory Visit: Payer: Self-pay | Admitting: Nurse Practitioner

## 2023-10-14 DIAGNOSIS — A609 Anogenital herpesviral infection, unspecified: Secondary | ICD-10-CM

## 2023-10-14 DIAGNOSIS — A6000 Herpesviral infection of urogenital system, unspecified: Secondary | ICD-10-CM

## 2023-10-14 MED ORDER — ACYCLOVIR 5 % EX OINT: 1.0000 | TOPICAL_OINTMENT | Freq: Four times a day (QID) | CUTANEOUS | 0 refills | Status: AC

## 2023-10-14 MED ORDER — VALACYCLOVIR HCL 1 G PO TABS: 1000.0000 mg | ORAL_TABLET | Freq: Two times a day (BID) | ORAL | 0 refills | Status: DC

## 2023-10-22 ENCOUNTER — Other Ambulatory Visit: Payer: Self-pay | Admitting: Internal Medicine

## 2023-10-22 DIAGNOSIS — F3341 Major depressive disorder, recurrent, in partial remission: Secondary | ICD-10-CM

## 2023-10-23 ENCOUNTER — Encounter: Payer: Self-pay | Admitting: Internal Medicine

## 2023-10-23 MED ORDER — PREGABALIN 150 MG PO CAPS: 150.0000 mg | ORAL_CAPSULE | Freq: Two times a day (BID) | ORAL | 0 refills | Status: DC

## 2023-11-07 ENCOUNTER — Other Ambulatory Visit: Payer: Self-pay | Admitting: Internal Medicine

## 2023-11-27 ENCOUNTER — Telehealth: Admitting: Physician Assistant

## 2023-11-27 DIAGNOSIS — H6691 Otitis media, unspecified, right ear: Secondary | ICD-10-CM | POA: Diagnosis not present

## 2023-11-27 MED ORDER — AMOXICILLIN-POT CLAVULANATE 875-125 MG PO TABS: 1.0000 | ORAL_TABLET | Freq: Two times a day (BID) | ORAL | 0 refills | Status: DC

## 2023-11-27 MED ORDER — CIPROFLOXACIN-DEXAMETHASONE 0.3-0.1 % OT SUSP: 4.0000 [drp] | Freq: Two times a day (BID) | OTIC | 0 refills | Status: AC

## 2023-11-27 NOTE — Patient Instructions (Signed)
 Kayla Floyd, thank you for joining [name], PA-C for today's virtual visit.  While this provider is not your primary care provider (PCP), if your PCP is located in our provider database this encounter information will be shared with them immediately following your visit.   A [facility] MyChart account gives you access to today's visit and all your visits, tests, and labs performed at [facility]  click here if you don't have a [facility] MyChart account or go to [email or web address]  Consent: (Patient) Kayla Floyd provided verbal consent for this virtual visit at the beginning of the encounter.  Current Medications:  Current Outpatient Medications:    acyclovir  ointment (ZOVIRAX ) 5 %, Apply 1 Application topically every 6 (six) hours., Disp: 15 g, Rfl: 0   amoxicillin -clavulanate (AUGMENTIN ) 875-125 MG tablet, Take 1 tablet by mouth 2 (two) times daily., Disp: 20 tablet, Rfl: 0   ciprofloxacin -dexamethasone  (CIPRODEX ) OTIC suspension, Place 4 drops into the right ear 2 (two) times daily for 7 days., Disp: 7.5 mL, Rfl: 0   acetaminophen  (TYLENOL ) 325 MG tablet, Take 2 tablets (650 mg total) by mouth every 6 (six) hours as needed (pain)., Disp: , Rfl:    atenolol  (TENORMIN ) 25 MG tablet, Take 1 tablet (25 mg total) by mouth 2 (two) times daily., Disp: 180 tablet, Rfl: 1   atomoxetine  (STRATTERA ) 40 MG capsule, Take 1 capsule (40 mg total) by mouth daily., Disp: 90 capsule, Rfl: 1   atorvastatin  (LIPITOR) 10 MG tablet, TAKE 1 TABLET BY MOUTH EVERY DAY, Disp: 90 tablet, Rfl: 2   AUVELITY  45-105 MG TBCR, TAKE 1 TABLET BY MOUTH TWICE A DAY IN THE MORNING AND IN THE EVENING, Disp: 60 tablet, Rfl: 5   b complex vitamins tablet, Take 1 tablet by mouth daily., Disp: , Rfl:    baclofen  (LIORESAL ) 10 MG tablet, TAKE 1 TABLET BY MOUTH THREE TIMES A DAY AS NEEDED FOR MUSCLE SPASM, Disp: 270 tablet, Rfl: 1   botulinum toxin Type A  (BOTOX ) 200 units injection, Inject  155 Units into the muscle every 3 (three) months. Inject 155 units into the muscles of head, and neck every 3 months discard remainder, Disp: 1 each, Rfl: 2   cetirizine (ZYRTEC) 10 MG tablet, Take 10 mg by mouth daily. Reported on 07/13/2015, Disp: , Rfl:    EPINEPHrine  0.3 mg/0.3 mL IJ SOAJ injection, Inject 0.3 mg into the muscle as needed for anaphylaxis., Disp: 1 each, Rfl: 5   erythromycin  ophthalmic ointment, Place a 1/2 inch ribbon of ointment into the lower eyelid of right eye.  Use 4 times daily for 5 days., Disp: 3.5 g, Rfl: 0   fenofibrate  (TRICOR ) 145 MG tablet, TAKE 1 TABLET (145MG  TOTAL) BY MOUTH EVERY DAY., Disp: 90 tablet, Rfl: 1   hydrOXYzine (ATARAX) 50 MG tablet, TAKE 1 - 2 TABLETS BY MOUTH ONCE DAILY AS NEEDED FOR ANXIETY/SLEEP, Disp: 180 tablet, Rfl: 1   hyoscyamine  (LEVSIN  SL) 0.125 MG SL tablet, Take 1 tablet (0.125 mg total) by mouth every 6 (six) hours as needed. PLACE 1 TABLET UNDER THE TONGUE EVERY 6 (SIX) HOURS AS NEEDED., Disp: 120 tablet, Rfl: 1   metFORMIN  (GLUCOPHAGE ) 500 MG tablet, Take 1 tablet (500 mg total) by mouth 2 (two) times daily with a meal., Disp: 180 tablet, Rfl: 2   Multiple Vitamin (MULTIVITAMIN) tablet, Take 1 tablet by mouth daily.  , Disp: , Rfl:    nitrofurantoin , macrocrystal-monohydrate, (MACROBID ) 100 MG capsule, Take 1 capsule (100 mg total)  by mouth as needed., Disp: 30 capsule, Rfl: 1   norethindrone -ethinyl estradiol  (AUROVELA 1/20) 1-20 MG-MCG tablet, Take 1 tablet by mouth daily., Disp: 84 tablet, Rfl: 3   omeprazole  (PRILOSEC) 40 MG capsule, TAKE 1 CAPSULE BY MOUTH EVERY DAY, Disp: 30 capsule, Rfl: 5   ondansetron  (ZOFRAN ) 8 MG tablet, Take 1 tablet (8 mg total) by mouth 2 (two) times daily as needed for nausea or vomiting., Disp: 20 tablet, Rfl: 0   oxyCODONE  (ROXICODONE ) 5 MG immediate release tablet, Take 1 tablet (5 mg total) by mouth every 4 (four) hours as needed., Disp: 15 tablet, Rfl: 0   pregabalin  (LYRICA ) 150 MG capsule, Take 1  capsule (150 mg total) by mouth 2 (two) times daily., Disp: 180 capsule, Rfl: 0   Probiotic Product (PROBIOTIC-10 PO), Take 1 capsule by mouth daily., Disp: , Rfl:    promethazine  (PHENERGAN ) 12.5 MG tablet, Take by mouth daily as needed., Disp: , Rfl:    rizatriptan  (MAXALT ) 10 MG tablet, Take 1 tablet (10 mg total) by mouth as needed for migraine. May repeat in 2 hours if needed, Disp: 10 tablet, Rfl: 11   tiZANidine  (ZANAFLEX ) 4 MG tablet, TAKE 1 TABLET BY MOUTH EVERY 8 HOURS AS NEEDED FOR MUSCLE SPASMS, Disp: 60 tablet, Rfl: 5   valACYclovir  (VALTREX ) 1000 MG tablet, Take 1 tablet (1,000 mg total) by mouth 2 (two) times daily., Disp: 60 tablet, Rfl: 0   Medications ordered in this encounter:  Meds ordered this encounter  Medications   amoxicillin -clavulanate (AUGMENTIN ) 875-125 MG tablet    Sig: Take 1 tablet by mouth 2 (two) times daily.    Dispense:  20 tablet    Refill:  0    Supervising Provider:   LAMPTEY, PHILIP O [[id number]]   ciprofloxacin -dexamethasone  (CIPRODEX ) OTIC suspension    Sig: Place 4 drops into the right ear 2 (two) times daily for 7 days.    Dispense:  7.5 mL    Refill:  0    Supervising Provider:   BLAISE ALEENE KIDD [[id number]]     *If you need refills on other medications prior to your next appointment, please contact your pharmacy*  Follow-Up: Call back or seek an in-person evaluation if the symptoms worsen or if the condition fails to improve as anticipated.  [facility] [phone]  Other Instructions  Otitis Media, Adult  Otitis media occurs when there is inflammation and fluid in the middle ear with signs and symptoms of an acute infection. The middle ear is a part of the ear that contains bones for hearing as well as air that helps send sounds to the brain. When infected fluid builds up in this space, it causes pressure and can lead to an ear infection. The eustachian tube connects the middle ear to the back of the nose (nasopharynx) and  normally allows air into the middle ear. If the eustachian tube becomes blocked, fluid can build up and become infected. What are the causes? This condition is caused by a blockage in the eustachian tube. This can be caused by mucus or by swelling of the tube. Problems that can cause a blockage include: A cold or other upper respiratory infection. Allergies. An irritant, such as tobacco smoke. Enlarged adenoids. The adenoids are areas of soft tissue located high in the back of the throat, behind the nose and the roof of the mouth. They are part of the body's defense system (immune system). A mass in the nasopharynx. Damage to the ear  caused by pressure changes (barotrauma). What increases the risk? You are more likely to develop this condition if you: Smoke or are exposed to tobacco smoke. Have an opening in the roof of your mouth (cleft palate). Have gastroesophageal reflux. Have an immune system disorder. What are the signs or symptoms? Symptoms of this condition include: Ear pain. Fever. Decreased hearing. Tiredness (lethargy). Fluid leaking from the ear, if the eardrum is ruptured or has burst. Ringing in the ear. How is this diagnosed?  This condition is diagnosed with a physical exam. During the exam, your health care provider will use an instrument called an otoscope to look in your ear and check for redness, swelling, and fluid. He or she will also ask about your symptoms. Your health care provider may also order tests, such as: A pneumatic otoscopy. This is a test to check the movement of the eardrum. It is done by squeezing a small amount of air into the ear. A tympanogram. This is a test that shows how well the eardrum moves in response to air pressure in the ear canal. It provides a graph for your health care provider to review. How is this treated? This condition can go away on its own within 3-5 days. But if the condition is caused by a bacterial infection and does not go  away on its own, or if it keeps coming back, your health care provider may: Prescribe antibiotic medicine to treat the infection. Prescribe or recommend medicines to control pain. Follow these instructions at home: Take over-the-counter and prescription medicines only as told by your health care provider. If you were prescribed an antibiotic medicine, take it as told by your health care provider. Do not stop taking the antibiotic even if you start to feel better. Keep all follow-up visits. This is important. Contact a health care provider if: You have bleeding from your nose. There is a lump on your neck. You are not feeling better in 5 days. You feel worse instead of better. Get help right away if: You have severe pain that is not controlled with medicine. You have swelling, redness, or pain around your ear. You have stiffness in your neck. A part of your face is not moving (paralyzed). The bone behind your ear (mastoid bone) is tender when you touch it. You develop a severe headache. Summary Otitis media is redness, soreness, and swelling of the middle ear, usually resulting in pain and decreased hearing. This condition can go away on its own within 3-5 days. If the problem does not go away in 3-5 days, your health care provider may give you medicines to treat the infection. If you were prescribed an antibiotic medicine, take it as told by your health care provider. Follow all instructions that were given to you by your health care provider. This information is not intended to replace advice given to you by your health care provider. Make sure you discuss any questions you have with your health care provider. Document Revised: 06/19/2020 Document Reviewed: 06/19/2020 Elsevier Patient Education  2024 Elsevier Inc.   If you have been instructed to have an in-person evaluation today at a local Urgent Care facility, please use the link below. It will take you to a list of all of our  available [facility], including address, phone number and hours of operation. Please do not delay care.  [facility]  If you or a family member do not have a primary care provider, use the link  below to schedule a visit and establish care. When you choose a [facility] primary care physician or advanced practice provider, you gain a long-term partner in health. Find a Primary Care Provider  Learn more about [facility]'s in-office and virtual care options:  - Get Care Now

## 2023-11-27 NOTE — Progress Notes (Signed)
 Virtual Visit Consent   Kayla Floyd, you are scheduled for a virtual visit with a [facility] provider today. Just as with appointments in the office, your consent must be obtained to participate. Your consent will be active for this visit and any virtual visit you may have with one of our providers in the next 365 days. If you have a MyChart account, a copy of this consent can be sent to you electronically.  As this is a virtual visit, video technology does not allow for your provider to perform a traditional examination. This may limit your provider's ability to fully assess your condition. If your provider identifies any concerns that need to be evaluated in person or the need to arrange testing (such as labs, EKG, etc.), we will make arrangements to do so. Although advances in technology are sophisticated, we cannot ensure that it will always work on either your end or our end. If the connection with a video visit is poor, the visit may have to be switched to a telephone visit. With either a video or telephone visit, we are not always able to ensure that we have a secure connection.  By engaging in this virtual visit, you consent to the provision of healthcare and authorize for your insurance to be billed (if applicable) for the services provided during this visit. Depending on your insurance coverage, you may receive a charge related to this service.  I need to obtain your verbal consent now. Are you willing to proceed with your visit today? Kayla Floyd has provided verbal consent on 11/27/2023 for a virtual visit (video or telephone). Kayla CHRISTELLA Dickinson, PA-C  Date: 11/27/2023 5:54 PM   Virtual Visit via Video Note   I, Kayla Floyd, connected with  Kayla Floyd  ([id number], May 24, 1980) on 11/27/23 at  5:45 PM EDT by a video-enabled telemedicine application and verified that I am speaking with the correct person using two identifiers.  Location: Patient: Virtual Visit  Location Patient: Home Provider: Virtual Visit Location Provider: Home Office   I discussed the limitations of evaluation and management by telemedicine and the availability of in person appointments. The patient expressed understanding and agreed to proceed.    History of Present Illness: URI Kayla Floyd is a 43 y.o. who identifies as a female who was assigned female at birth, and is being seen today for otalgia.  HPI: Otalgia  There is pain in the right (starting in left now) ear. This is a new problem. The current episode started yesterday. The problem occurs constantly. The problem has been gradually worsening. There has been no fever. The pain is moderate. Associated symptoms include headaches, hearing loss (muffled) and a sore throat. Pertinent negatives include no coughing, ear discharge, neck pain or rhinorrhea. Associated symptoms comments: Swollen adenoids, swollen lymph nodes into the right axilla, hoarse voice. She has tried nothing for the symptoms. The treatment provided no relief.     Problems:  Patient Active Problem List   Diagnosis Date Noted   Anemia 01/14/2022   Insomnia 04/24/2021   Multiple food allergies 10/25/2020   Iron deficiency anemia 10/17/2020   EDS (Ehlers-Danlos syndrome) 03/02/2019   Myalgia 10/20/2018   Dysphagia 06/27/2018   Acute cystitis without hematuria 09/23/2017   Gastroparesis 10/28/2016   Prediabetes 08/31/2016   Genetic testing 08/29/2016   Depression 08/28/2016   Family history of colon cancer    Family history of breast cancer    Obese 03/15/2015   Chronic migraine without  aura without status migrainosus, not intractable 06/20/2014   Tear meniscus knee 06/14/2010   Dysautonomia - POTS 03/20/2010   Anxiety 07/30/2009   SYNCOPE AND COLLAPSE 07/10/2009   PALPITATIONS 07/10/2009   POLYCYSTIC OVARIAN DISEASE 08/31/2008   Hypercholesterolemia with hypertriglyceridemia 08/31/2008   GERD 08/31/2008   Chronic back pain 08/31/2008   ADHD  (attention deficit hyperactivity disorder) [birth year] 1979    Allergies:  Allergies  Allergen Reactions   Alpha-Gal Anaphylaxis   Beef-Derived Drug Products     anaphylaxes    Glycerol, Iodinated Anaphylaxis    anaphylaxes    Milk-Related Compounds Anaphylaxis    Cheese, whey protein, gelatin   Pork-Derived Products     anaplaxis   Medications:  Current Outpatient Medications:    acyclovir  ointment (ZOVIRAX ) 5 %, Apply 1 Application topically every 6 (six) hours., Disp: 15 g, Rfl: 0   amoxicillin -clavulanate (AUGMENTIN ) 875-125 MG tablet, Take 1 tablet by mouth 2 (two) times daily., Disp: 20 tablet, Rfl: 0   ciprofloxacin -dexamethasone  (CIPRODEX ) OTIC suspension, Place 4 drops into the right ear 2 (two) times daily for 7 days., Disp: 7.5 mL, Rfl: 0   acetaminophen  (TYLENOL ) 325 MG tablet, Take 2 tablets (650 mg total) by mouth every 6 (six) hours as needed (pain)., Disp: , Rfl:    atenolol  (TENORMIN ) 25 MG tablet, Take 1 tablet (25 mg total) by mouth 2 (two) times daily., Disp: 180 tablet, Rfl: 1   atomoxetine  (STRATTERA ) 40 MG capsule, Take 1 capsule (40 mg total) by mouth daily., Disp: 90 capsule, Rfl: 1   atorvastatin  (LIPITOR) 10 MG tablet, TAKE 1 TABLET BY MOUTH EVERY DAY, Disp: 90 tablet, Rfl: 2   AUVELITY  45-105 MG TBCR, TAKE 1 TABLET BY MOUTH TWICE A DAY IN THE MORNING AND IN THE EVENING, Disp: 60 tablet, Rfl: 5   b complex vitamins tablet, Take 1 tablet by mouth daily., Disp: , Rfl:    baclofen  (LIORESAL ) 10 MG tablet, TAKE 1 TABLET BY MOUTH THREE TIMES A DAY AS NEEDED FOR MUSCLE SPASM, Disp: 270 tablet, Rfl: 1   botulinum toxin Type A  (BOTOX ) 200 units injection, Inject 155 Units into the muscle every 3 (three) months. Inject 155 units into the muscles of head, and neck every 3 months discard remainder, Disp: 1 each, Rfl: 2   cetirizine (ZYRTEC) 10 MG tablet, Take 10 mg by mouth daily. Reported on 07/13/2015, Disp: , Rfl:    EPINEPHrine  0.3 mg/0.3 mL IJ SOAJ injection, Inject 0.3  mg into the muscle as needed for anaphylaxis., Disp: 1 each, Rfl: 5   erythromycin  ophthalmic ointment, Place a 1/2 inch ribbon of ointment into the lower eyelid of right eye.  Use 4 times daily for 5 days., Disp: 3.5 g, Rfl: 0   fenofibrate  (TRICOR ) 145 MG tablet, TAKE 1 TABLET (145MG  TOTAL) BY MOUTH EVERY DAY., Disp: 90 tablet, Rfl: 1   hydrOXYzine (ATARAX) 50 MG tablet, TAKE 1 - 2 TABLETS BY MOUTH ONCE DAILY AS NEEDED FOR ANXIETY/SLEEP, Disp: 180 tablet, Rfl: 1   hyoscyamine  (LEVSIN  SL) 0.125 MG SL tablet, Take 1 tablet (0.125 mg total) by mouth every 6 (six) hours as needed. PLACE 1 TABLET UNDER THE TONGUE EVERY 6 (SIX) HOURS AS NEEDED., Disp: 120 tablet, Rfl: 1   metFORMIN  (GLUCOPHAGE ) 500 MG tablet, Take 1 tablet (500 mg total) by mouth 2 (two) times daily with a meal., Disp: 180 tablet, Rfl: 2   Multiple Vitamin (MULTIVITAMIN) tablet, Take 1 tablet by mouth daily.  , Disp: , Rfl:  nitrofurantoin , macrocrystal-monohydrate, (MACROBID ) 100 MG capsule, Take 1 capsule (100 mg total) by mouth as needed., Disp: 30 capsule, Rfl: 1   norethindrone -ethinyl estradiol  (AUROVELA 1/20) 1-20 MG-MCG tablet, Take 1 tablet by mouth daily., Disp: 84 tablet, Rfl: 3   omeprazole  (PRILOSEC) 40 MG capsule, TAKE 1 CAPSULE BY MOUTH EVERY DAY, Disp: 30 capsule, Rfl: 5   ondansetron  (ZOFRAN ) 8 MG tablet, Take 1 tablet (8 mg total) by mouth 2 (two) times daily as needed for nausea or vomiting., Disp: 20 tablet, Rfl: 0   oxyCODONE  (ROXICODONE ) 5 MG immediate release tablet, Take 1 tablet (5 mg total) by mouth every 4 (four) hours as needed., Disp: 15 tablet, Rfl: 0   pregabalin  (LYRICA ) 150 MG capsule, Take 1 capsule (150 mg total) by mouth 2 (two) times daily., Disp: 180 capsule, Rfl: 0   Probiotic Product (PROBIOTIC-10 PO), Take 1 capsule by mouth daily., Disp: , Rfl:    promethazine  (PHENERGAN ) 12.5 MG tablet, Take by mouth daily as needed., Disp: , Rfl:    rizatriptan  (MAXALT ) 10 MG tablet, Take 1 tablet (10 mg  total) by mouth as needed for migraine. May repeat in 2 hours if needed, Disp: 10 tablet, Rfl: 11   tiZANidine  (ZANAFLEX ) 4 MG tablet, TAKE 1 TABLET BY MOUTH EVERY 8 HOURS AS NEEDED FOR MUSCLE SPASMS, Disp: 60 tablet, Rfl: 5   valACYclovir  (VALTREX ) 1000 MG tablet, Take 1 tablet (1,000 mg total) by mouth 2 (two) times daily., Disp: 60 tablet, Rfl: 0  Observations/Objective: Patient is well-developed, well-nourished in no acute distress.  Resting comfortably at home.  Head is normocephalic, atraumatic.  No labored breathing.  Speech is clear and coherent with logical content.  Patient is alert and oriented at baseline.    Assessment and Plan: 1. Acute bacterial otitis media, right (Primary) - amoxicillin -clavulanate (AUGMENTIN ) 875-125 MG tablet; Take 1 tablet by mouth 2 (two) times daily.  Dispense: 20 tablet; Refill: 0 - ciprofloxacin -dexamethasone  (CIPRODEX ) OTIC suspension; Place 4 drops into the right ear 2 (two) times daily for 7 days.  Dispense: 7.5 mL; Refill: 0  - Worsening symptoms that have not responded to OTC medications.  - Will give Augmentin  and Ciprodex  - Continue saline nasal rinses - Could consider to add Flonase (Fluticasone) nasal spray over the counter for possible eustachian tube dysfunction - Steam and humidifier can help - Warm compress to ear - Stay well hydrated and get plenty of rest.  - Seek in person evaluation if no symptom improvement or if symptoms worsen  Follow Up Instructions: I discussed the assessment and treatment plan with the patient. The patient was provided an opportunity to ask questions and all were answered. The patient agreed with the plan and demonstrated an understanding of the instructions.  A copy of instructions were sent to the patient via MyChart unless otherwise noted below.    The patient was advised to call back or seek an in-person evaluation if the symptoms worsen or if the condition fails to improve as anticipated.     Kayla CHRISTELLA Dickinson, PA-C

## 2023-12-04 NOTE — Telephone Encounter (Signed)
 Completed auth renewal request via CMM, status is pending. Key: [id number]

## 2023-12-04 NOTE — Telephone Encounter (Signed)
 Received approval, pt will continue to fill through [facility].  Auth#: [id number] (12/04/23-03/04/24)

## 2023-12-07 ENCOUNTER — Other Ambulatory Visit: Payer: Self-pay | Admitting: Internal Medicine

## 2023-12-30 ENCOUNTER — Ambulatory Visit: Admitting: Adult Health

## 2023-12-30 NOTE — Progress Notes (Deleted)
 Botox - 200 units x 1 vial Lot: [id number] Expiration: 02/2026 NDC: 9976-6078-97   Bacteriostatic 0.9% Sodium Chloride - 4 mL  Lot: [id number] Expiration: 01/22/2025 NDC: 9590-8033-97   Dx: H56.290 S/P  Witnessed by Rojean DEL

## 2023-12-31 ENCOUNTER — Encounter: Payer: Self-pay | Admitting: Adult Health

## 2023-12-31 ENCOUNTER — Ambulatory Visit: Admitting: Adult Health

## 2023-12-31 VITALS — BP 122/72 | HR 72

## 2023-12-31 DIAGNOSIS — G43709 Chronic migraine without aura, not intractable, without status migrainosus: Secondary | ICD-10-CM

## 2023-12-31 MED ORDER — ONABOTULINUMTOXINA 200 UNITS IJ SOLR
155.0000 [IU] | Freq: Once | INTRAMUSCULAR | Status: AC
  Administered 2023-12-31: 165 [IU] via INTRAMUSCULAR

## 2023-12-31 NOTE — Progress Notes (Signed)
 Botox - 200 units x 1 vial Lot: [id number] Expiration: 02/2026 NDC: 9976-6078-97   Bacteriostatic 0.9% Sodium Chloride - 4ml   Lot: [id number] Expiration: 08/2024 NDC: 9590-8033-97   Dx: H56.290 S/P  Witnessed by Rojean

## 2023-12-31 NOTE — Progress Notes (Signed)
 Update 12/31/2023 JM: Returns for repeat Botox , prior injection 10/07/2023.  Migraines remain well-controlled on Botox  with continued >50% migraine reduction.  Continue jaw pain and clenching which can trigger migraines but has improved with masseter injections. Use of rizatriptan  with benefit.  Tolerated procedure well.  Return in 3 months for repeat injection.     Consent Form Botulism Toxin Injection For Chronic Migraine    Reviewed orally with patient, additionally signature is on file:  Botulism toxin has been approved by the Federal drug administration for treatment of chronic migraine. Botulism toxin does not cure chronic migraine and it may not be effective in some patients.  The administration of botulism toxin is accomplished by injecting a small amount of toxin into the muscles of the neck and head. Dosage must be titrated for each individual. Any benefits resulting from botulism toxin tend to wear off after 3 months with a repeat injection required if benefit is to be maintained. Injections are usually done every 3-4 months with maximum effect peak achieved by about 2 or 3 weeks. Botulism toxin is expensive and you should be sure of what costs you will incur resulting from the injection.  The side effects of botulism toxin use for chronic migraine may include:   -Transient, and usually mild, facial weakness with facial injections  -Transient, and usually mild, head or neck weakness with head/neck injections  -Reduction or loss of forehead facial animation due to forehead muscle weakness  -Eyelid drooping  -Dry eye  -Pain at the site of injection or bruising at the site of injection  -Double vision  -Potential unknown long term risks   Contraindications: You should not have Botox  if you are pregnant, nursing, allergic to albumin, have an infection, skin condition, or muscle weakness at the site of the injection, or have myasthenia gravis, Lambert-Eaton syndrome, or  ALS.  It is also possible that as with any injection, there may be an allergic reaction or no effect from the medication. Reduced effectiveness after repeated injections is sometimes seen and rarely infection at the injection site may occur. All care will be taken to prevent these side effects. If therapy is given over a long time, atrophy and wasting in the muscle injected may occur. Occasionally the patient's become refractory to treatment because they develop antibodies to the toxin. In this event, therapy needs to be modified.  I have read the above information and consent to the administration of botulism toxin.    BOTOX  PROCEDURE NOTE FOR MIGRAINE HEADACHE  Contraindications and precautions discussed with patient(above). Aseptic procedure was observed and patient tolerated procedure. Procedure performed by Harlene Bogaert, AGNP-BC.   The condition has existed for more than 6 months, and pt does not have a diagnosis of ALS, Myasthenia Gravis or Lambert-Eaton Syndrome.  Risks and benefits of injections discussed and pt agrees to proceed with the procedure.  Written consent obtained  These injections are medically necessary. Pt  receives good benefits from these injections. These injections do not cause sedations or hallucinations which the oral therapies may cause.   Description of procedure:  The patient was placed in a sitting position. The standard protocol was used for Botox  as follows, with 5 units of Botox  injected at each site:  -Procerus muscle, midline injection  -Corrugator muscle, bilateral injection  -Frontalis muscle, bilateral injection, with 2 sites each side, medial injection was performed in the upper one third of the frontalis muscle, in the region vertical from the medial inferior edge  of the superior orbital rim. The lateral injection was again in the upper one third of the forehead vertically above the lateral limbus of the cornea, 1.5 cm lateral to the medial  injection site.  -Temporalis muscle injection, 4 sites, bilaterally. The first injection was 3 cm above the tragus of the ear, second injection site was 1.5 cm to 3 cm up from the first injection site in line with the tragus of the ear. The third injection site was 1.5-3 cm forward between the first 2 injection sites. The fourth injection site was 1.5 cm posterior to the second injection site. 5th site laterally in the temporalis  muscleat the level of the outer canthus.  -Occipitalis muscle injection, 3 sites, bilaterally. The first injection was done one half way between the occipital protuberance and the tip of the mastoid process behind the ear. The second injection site was done lateral and superior to the first, 1 fingerbreadth from the first injection. The third injection site was 1 fingerbreadth superiorly and medially from the first injection site.  -Cervical paraspinal muscle injection, 2 sites, bilaterally. The first injection site was 1 cm from the midline of the cervical spine, 3 cm inferior to the lower border of the occipital protuberance. The second injection site was 1.5 cm superiorly and laterally to the first injection site.  -Trapezius muscle injection was performed at 3 sites, bilaterally. The first injection site was in the upper trapezius muscle halfway between the inflection point of the neck, and the acromion. The second injection site was one half way between the acromion and the first injection site. The third injection was done between the first injection site and the inflection point of the neck.  -Masseter muscle injection was performed at 1 site, bilaterally.     A total of 200 units of Botox  was prepared, 165 units of Botox  was injected as documented above, any Botox  not injected was wasted. The patient tolerated the procedure well, there were no complications of the above procedure.   Harlene Bogaert, AGNP-BC  [facility] [address] [place], [place] [address]  Phone [phone] Fax [phone] Note: This document was prepared with digital dictation and possible smart phrase technology. Any transcriptional errors that result from this process are unintentional.

## 2024-01-06 ENCOUNTER — Other Ambulatory Visit: Payer: Self-pay | Admitting: Nurse Practitioner

## 2024-01-06 DIAGNOSIS — A609 Anogenital herpesviral infection, unspecified: Secondary | ICD-10-CM

## 2024-01-06 NOTE — Telephone Encounter (Signed)
 Med refill request:  Valacyclovir  (Valtrex ) 1000 mg Start:  10/14/23 - Disp: 60 tablets with 0 refills  Last B&P:  06/26/23 Next AEX:  Not yet scheduled Last MMG (if hormonal med): N/A Refill authorized? Please Advise.

## 2024-01-24 ENCOUNTER — Other Ambulatory Visit: Payer: Self-pay | Admitting: Internal Medicine

## 2024-01-26 ENCOUNTER — Encounter: Payer: Self-pay | Admitting: Internal Medicine

## 2024-02-03 ENCOUNTER — Encounter: Payer: Self-pay | Admitting: *Deleted

## 2024-02-03 NOTE — Progress Notes (Signed)
 Kayla Floyd                                          MRN: [id number]   02/03/2024   The [facility] Quality Team Specialist reviewed this patient medical record for the purposes of chart review for care gap closure. The following were reviewed: chart review for care gap closure-diabetic eye exam and kidney health evaluation for diabetes:eGFR  and uACR.    [facility] Quality Team

## 2024-02-03 NOTE — Progress Notes (Signed)
 Kayla Floyd                                          MRN: [id number]   02/03/2024   The [facility] Quality Team Specialist reviewed this patient medical record for the purposes of chart review for care gap closure. The following were reviewed: abstraction for care gap closure-glycemic status assessment.    [facility] Quality Team

## 2024-02-17 ENCOUNTER — Other Ambulatory Visit: Payer: Self-pay | Admitting: Internal Medicine

## 2024-02-17 DIAGNOSIS — G8929 Other chronic pain: Secondary | ICD-10-CM

## 2024-02-19 ENCOUNTER — Other Ambulatory Visit: Payer: Self-pay | Admitting: Internal Medicine

## 2024-02-22 ENCOUNTER — Encounter: Payer: Self-pay | Admitting: Internal Medicine

## 2024-02-22 NOTE — Progress Notes (Signed)
 "   Subjective:    Patient ID: Kayla Floyd, female    DOB: May 29, 1980, 43 y.o.   MRN: [id number]      HPI Kayla Floyd is here for a Physical exam and her chronic medical problems.      Medications and allergies reviewed with patient and updated if appropriate.  Current Outpatient Medications on File Prior to Visit  Medication Sig Dispense Refill   acetaminophen  (TYLENOL ) 325 MG tablet Take 2 tablets (650 mg total) by mouth every 6 (six) hours as needed (pain).     acyclovir  ointment (ZOVIRAX ) 5 % Apply 1 Application topically every 6 (six) hours. 15 g 0   atomoxetine  (STRATTERA ) 40 MG capsule TAKE 1 CAPSULE (40 MG TOTAL) BY MOUTH DAILY. 90 capsule 1   atorvastatin  (LIPITOR) 10 MG tablet TAKE 1 TABLET BY MOUTH EVERY DAY 90 tablet 2   b complex vitamins tablet Take 1 tablet by mouth daily.     baclofen  (LIORESAL ) 10 MG tablet TAKE 1 TABLET BY MOUTH THREE TIMES A DAY AS NEEDED FOR MUSCLE SPASM 270 tablet 1   botulinum toxin Type A  (BOTOX ) 200 units injection Inject 155 Units into the muscle every 3 (three) months. Inject 155 units into the muscles of head, and neck every 3 months discard remainder 1 each 2   cetirizine (ZYRTEC) 10 MG tablet Take 10 mg by mouth daily. Reported on 07/13/2015     EPINEPHrine  0.3 mg/0.3 mL IJ SOAJ injection Inject 0.3 mg into the muscle as needed for anaphylaxis. 1 each 5   hydrOXYzine (ATARAX) 50 MG tablet TAKE 1 - 2 TABLETS BY MOUTH ONCE DAILY AS NEEDED FOR ANXIETY/SLEEP 180 tablet 1   Multiple Vitamin (MULTIVITAMIN) tablet Take 1 tablet by mouth daily.       nitrofurantoin , macrocrystal-monohydrate, (MACROBID ) 100 MG capsule Take 1 capsule (100 mg total) by mouth as needed. 30 capsule 1   norethindrone -ethinyl estradiol  (AUROVELA 1/20) 1-20 MG-MCG tablet Take 1 tablet by mouth daily. 84 tablet 3   ondansetron  (ZOFRAN ) 8 MG tablet Take 1 tablet (8 mg total) by mouth 2 (two) times daily as needed for nausea or vomiting. 20 tablet 0    Probiotic Product (PROBIOTIC-10 PO) Take 1 capsule by mouth daily.     promethazine  (PHENERGAN ) 12.5 MG tablet Take by mouth daily as needed.     tiZANidine  (ZANAFLEX ) 4 MG tablet TAKE 1 TABLET BY MOUTH EVERY 8 HOURS AS NEEDED FOR MUSCLE SPASMS 60 tablet 5   valACYclovir  (VALTREX ) 1000 MG tablet TAKE 1 TABLET BY MOUTH TWICE A DAY 60 tablet 0   No current facility-administered medications on file prior to visit.    Review of Systems     Objective:  There were no vitals filed for this visit. There were no vitals filed for this visit. There is no height or weight on file to calculate BMI.  BP Readings from Last 3 Encounters:  04/12/24 116/78  03/30/24 127/72  12/31/23 122/72    Wt Readings from Last 3 Encounters:  04/12/24 185 lb (83.9 kg)  08/24/23 182 lb (82.6 kg)  08/15/23 182 lb (82.6 kg)       Physical Exam Constitutional: She appears well-developed and well-nourished. No distress.  HENT:  Head: Normocephalic and atraumatic.  Right Ear: External ear normal. Normal ear canal and TM Left Ear: External ear normal.  Normal ear canal and TM Mouth/Throat: Oropharynx is clear and moist.  Eyes: Conjunctivae normal.  Neck: Neck supple. No tracheal deviation present. No thyromegaly  present.  No carotid bruit  Cardiovascular: Normal rate, regular rhythm and normal heart sounds.   No murmur heard.  No edema. Pulmonary/Chest: Effort normal and breath sounds normal. No respiratory distress. She has no wheezes. She has no rales.  Breast: deferred   Abdominal: Soft. She exhibits no distension. There is no tenderness.  Lymphadenopathy: She has no cervical adenopathy.  Skin: Skin is warm and dry. She is not diaphoretic.  Psychiatric: She has a normal mood and affect. Her behavior is normal.     Lab Results  Component Value Date   WBC 6.1 04/12/2024   HGB 10.0 (L) 04/12/2024   HCT 31.4 (L) 04/12/2024   PLT 419.0 (H) 04/12/2024   GLUCOSE 65 (L) 04/12/2024   CHOL 188  04/12/2024   TRIG 242.0 (H) 04/12/2024   HDL 41.20 04/12/2024   LDLDIRECT 108.0 07/16/2022   LDLCALC 98 04/12/2024   ALT 12 04/12/2024   AST 18 04/12/2024   NA 138 04/12/2024   K 4.3 04/12/2024   CL 104 04/12/2024   CREATININE 0.73 04/12/2024   BUN 9 04/12/2024   CO2 26 04/12/2024   TSH 2.33 04/12/2024   INR 0.9 10/17/2020   HGBA1C 6.1 04/12/2024   MICROALBUR <0.7 04/12/2024         Assessment & Plan:   Physical exam: Screening blood work  ordered Exercise   Weight   Substance abuse  none   Reviewed recommended immunizations.   Health Maintenance  Topic Date Due   OPHTHALMOLOGY EXAM  Never done   COVID-19 Vaccine (4 - 2025-26 season) 11/24/2023   Medicare Annual Wellness (AWV)  06/24/2024   Influenza Vaccine  06/22/2024 (Originally 10/24/2023)   Pneumococcal Vaccine (1 of 2 - PCV) 04/12/2025 (Originally 01/04/1999)   Mammogram  08/19/2024   HEMOGLOBIN A1C  10/10/2024   Diabetic kidney evaluation - eGFR measurement  04/12/2025   Diabetic kidney evaluation - Urine ACR  04/12/2025   FOOT EXAM  04/12/2025   Cervical Cancer Screening (HPV/Pap Cotest)  06/25/2028   DTaP/Tdap/Td (10 - Td or Tdap) 09/09/2031   HPV VACCINES (No Doses Required) Completed   Hepatitis C Screening  Completed   HIV Screening  Completed   Meningococcal B Vaccine  Aged Out   Hepatitis B Vaccines 19-59 Average Risk  Discontinued          See Problem List for Assessment and Plan of chronic medical problems.     This encounter was created in error - please disregard. "

## 2024-02-22 NOTE — Patient Instructions (Addendum)

## 2024-02-23 ENCOUNTER — Encounter: Payer: Self-pay | Admitting: Internal Medicine

## 2024-02-23 DIAGNOSIS — G4709 Other insomnia: Secondary | ICD-10-CM

## 2024-02-23 DIAGNOSIS — F900 Attention-deficit hyperactivity disorder, predominantly inattentive type: Secondary | ICD-10-CM

## 2024-02-23 DIAGNOSIS — G43709 Chronic migraine without aura, not intractable, without status migrainosus: Secondary | ICD-10-CM

## 2024-02-23 DIAGNOSIS — D509 Iron deficiency anemia, unspecified: Secondary | ICD-10-CM

## 2024-02-23 DIAGNOSIS — K219 Gastro-esophageal reflux disease without esophagitis: Secondary | ICD-10-CM

## 2024-02-23 DIAGNOSIS — F3289 Other specified depressive episodes: Secondary | ICD-10-CM

## 2024-02-23 DIAGNOSIS — G8929 Other chronic pain: Secondary | ICD-10-CM

## 2024-02-23 DIAGNOSIS — E1169 Type 2 diabetes mellitus with other specified complication: Secondary | ICD-10-CM

## 2024-02-23 DIAGNOSIS — Z0000 Encounter for general adult medical examination without abnormal findings: Secondary | ICD-10-CM

## 2024-02-23 DIAGNOSIS — F419 Anxiety disorder, unspecified: Secondary | ICD-10-CM

## 2024-02-23 DIAGNOSIS — G901 Familial dysautonomia [Riley-Day]: Secondary | ICD-10-CM

## 2024-03-03 NOTE — Telephone Encounter (Signed)
 Received approval, she will continue to fill through [facility]. Auth#: [id number] (03/03/24-03/24/25)

## 2024-03-03 NOTE — Telephone Encounter (Signed)
 Submitted auth renewal request, status is pending. Key: [id number]

## 2024-03-05 ENCOUNTER — Telehealth: Payer: Self-pay | Admitting: Internal Medicine

## 2024-03-05 NOTE — Telephone Encounter (Signed)
 My-chart sent to patient and I also called and left message for her to call office for appointment.

## 2024-03-05 NOTE — Telephone Encounter (Signed)
 Overdue for follow-up.  She needs to be seen twice a year because of the medication she is on.  She no-showed that her recent scheduled physical appointment

## 2024-03-12 ENCOUNTER — Telehealth: Payer: Self-pay | Admitting: Pharmacist

## 2024-03-12 NOTE — Telephone Encounter (Signed)
 Patient has been scheduled

## 2024-03-12 NOTE — Progress Notes (Signed)
 Pharmacy Quality Measure Review  This patient is appearing on a report for being at risk of failing the adherence measure for cholesterol (statin) medications this calendar year.   Medication: Atorvastatin  Last fill date: 10/06/23 for 90 day supply  Spoke to patient - she states she is still taking atorvastatin  daily however still has supply she is working through before getting it refilled.  Pt had questions regarding GLP-1 and HRT interactions with her other medications if she were to take either in the future. Discussed that she likely wouldn't need metformin  if she did a GLP-1 although they can be taken together. Additionally, if she was prescribed HRT, her current hormonal oral contraceptive would need to be taken into account. She plans to discuss this with Dr. Geofm  I noted that Tobias contacted patient 12/12 to reschedule 12/1 missed appointment. Rescheduled for 04/13/23.  Darrelyn Drum, PharmD, BCPS, CPP Clinical Pharmacist Practitioner  [facility] at [facility] [phone]

## 2024-03-23 NOTE — Progress Notes (Signed)
 GESELLE CARDOSA                                          MRN: [id number]   03/23/2024   The [facility]I Quality Team Specialist reviewed this patient medical record for the purposes of chart review for care gap closure. The following were reviewed: chart review for care gap closure-diabetic eye exam, glycemic status assessment, and kidney health evaluation for diabetes:eGFR  and uACR.    [facility]BCI Quality Team

## 2024-03-30 ENCOUNTER — Ambulatory Visit: Admitting: Adult Health

## 2024-03-30 ENCOUNTER — Encounter: Payer: Self-pay | Admitting: Adult Health

## 2024-03-30 VITALS — BP 127/72 | HR 119

## 2024-03-30 DIAGNOSIS — G43709 Chronic migraine without aura, not intractable, without status migrainosus: Secondary | ICD-10-CM

## 2024-03-30 MED ORDER — ONABOTULINUMTOXINA 200 UNITS IJ SOLR
155.0000 [IU] | Freq: Once | INTRAMUSCULAR | Status: AC
  Administered 2024-03-30: 165 [IU] via INTRAMUSCULAR

## 2024-03-30 MED ORDER — RIZATRIPTAN BENZOATE 10 MG PO TABS: 10.0000 mg | ORAL_TABLET | ORAL | 11 refills | Status: AC | PRN

## 2024-03-30 NOTE — Progress Notes (Signed)
 "    Update 03/30/2024 JM: Returns for repeat Botox , prior injection 12/31/2023.  Migraines remain well-controlled on Botox  with continued >50% migraine reduction.  Continued jaw pain and clenching which can trigger migraines but has improved with masseter injections. Use of rizatriptan  with benefit.  Tolerated procedure well.  Return in 3 months for repeat injection.     Consent Form Botulism Toxin Injection For Chronic Migraine    Reviewed orally with patient, additionally signature is on file:  Botulism toxin has been approved by the Federal drug administration for treatment of chronic migraine. Botulism toxin does not cure chronic migraine and it may not be effective in some patients.  The administration of botulism toxin is accomplished by injecting a small amount of toxin into the muscles of the neck and head. Dosage must be titrated for each individual. Any benefits resulting from botulism toxin tend to wear off after 3 months with a repeat injection required if benefit is to be maintained. Injections are usually done every 3-4 months with maximum effect peak achieved by about 2 or 3 weeks. Botulism toxin is expensive and you should be sure of what costs you will incur resulting from the injection.  The side effects of botulism toxin use for chronic migraine may include:   -Transient, and usually mild, facial weakness with facial injections  -Transient, and usually mild, head or neck weakness with head/neck injections  -Reduction or loss of forehead facial animation due to forehead muscle weakness  -Eyelid drooping  -Dry eye  -Pain at the site of injection or bruising at the site of injection  -Double vision  -Potential unknown long term risks   Contraindications: You should not have Botox  if you are pregnant, nursing, allergic to albumin, have an infection, skin condition, or muscle weakness at the site of the injection, or have myasthenia gravis, Lambert-Eaton syndrome, or  ALS.  It is also possible that as with any injection, there may be an allergic reaction or no effect from the medication. Reduced effectiveness after repeated injections is sometimes seen and rarely infection at the injection site may occur. All care will be taken to prevent these side effects. If therapy is given over a long time, atrophy and wasting in the muscle injected may occur. Occasionally the patient's become refractory to treatment because they develop antibodies to the toxin. In this event, therapy needs to be modified.  I have read the above information and consent to the administration of botulism toxin.    BOTOX  PROCEDURE NOTE FOR MIGRAINE HEADACHE  Contraindications and precautions discussed with patient(above). Aseptic procedure was observed and patient tolerated procedure. Procedure performed by Harlene Bogaert, AGNP-BC.   The condition has existed for more than 6 months, and pt does not have a diagnosis of ALS, Myasthenia Gravis or Lambert-Eaton Syndrome.  Risks and benefits of injections discussed and pt agrees to proceed with the procedure.  Written consent obtained  These injections are medically necessary. Pt  receives good benefits from these injections. These injections do not cause sedations or hallucinations which the oral therapies may cause.   Description of procedure:  The patient was placed in a sitting position. The standard protocol was used for Botox  as follows, with 5 units of Botox  injected at each site:  -Procerus muscle, midline injection  -Corrugator muscle, bilateral injection  -Frontalis muscle, bilateral injection, with 2 sites each side, medial injection was performed in the upper one third of the frontalis muscle, in the region vertical from the medial inferior  edge of the superior orbital rim. The lateral injection was again in the upper one third of the forehead vertically above the lateral limbus of the cornea, 1.5 cm lateral to the medial  injection site.  -Temporalis muscle injection, 4 sites, bilaterally. The first injection was 3 cm above the tragus of the ear, second injection site was 1.5 cm to 3 cm up from the first injection site in line with the tragus of the ear. The third injection site was 1.5-3 cm forward between the first 2 injection sites. The fourth injection site was 1.5 cm posterior to the second injection site. 5th site laterally in the temporalis  muscleat the level of the outer canthus.  -Occipitalis muscle injection, 3 sites, bilaterally. The first injection was done one half way between the occipital protuberance and the tip of the mastoid process behind the ear. The second injection site was done lateral and superior to the first, 1 fingerbreadth from the first injection. The third injection site was 1 fingerbreadth superiorly and medially from the first injection site.  -Cervical paraspinal muscle injection, 2 sites, bilaterally. The first injection site was 1 cm from the midline of the cervical spine, 3 cm inferior to the lower border of the occipital protuberance. The second injection site was 1.5 cm superiorly and laterally to the first injection site.  -Trapezius muscle injection was performed at 3 sites, bilaterally. The first injection site was in the upper trapezius muscle halfway between the inflection point of the neck, and the acromion. The second injection site was one half way between the acromion and the first injection site. The third injection was done between the first injection site and the inflection point of the neck.  -Masseter muscle injection was performed at 1 site, bilaterally.     A total of 200 units of Botox  was prepared, 165 units of Botox  was injected as documented above, any Botox  not injected was wasted. The patient tolerated the procedure well, there were no complications of the above procedure.   Harlene Bogaert, AGNP-BC  [facility] [address] [place], [place] [address]  Phone [phone] Fax [phone] Note: This document was prepared with digital dictation and possible smart phrase technology. Any transcriptional errors that result from this process are unintentional.    "

## 2024-03-30 NOTE — Progress Notes (Signed)
 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

## 2024-04-11 NOTE — Patient Instructions (Addendum)
 " Health Maintenance, Female Adopting a healthy lifestyle and getting preventive care are important in promoting health and wellness. Ask your health care provider about: The right schedule for you to have regular tests and exams. Things you can do on your own to prevent diseases and keep yourself healthy. What should I know about diet, weight, and exercise? Eat a healthy diet  Eat a diet that includes plenty of vegetables, fruits, low-fat dairy products, and lean protein. Do not eat a lot of foods that are high in solid fats, added sugars, or sodium. Maintain a healthy weight Body mass index (BMI) is used to identify weight problems. It estimates body fat based on height and weight. Your health care provider can help determine your BMI and help you achieve or maintain a healthy weight. Get regular exercise Get regular exercise. This is one of the most important things you can do for your health. Most adults should: Exercise for at least 150 minutes each week. The exercise should increase your heart rate and make you sweat (moderate-intensity exercise). Do strengthening exercises at least twice a week. This is in addition to the moderate-intensity exercise. Spend less time sitting. Even light physical activity can be beneficial. Watch cholesterol and blood lipids Have your blood tested for lipids and cholesterol at 45 years of age, then have this test every 5 years. Have your cholesterol levels checked more often if: Your lipid or cholesterol levels are high. You are older than 45 years of age. You are at high risk for heart disease. What should I know about cancer screening? Depending on your health history and family history, you may need to have cancer screening at various ages. This may include screening for: Breast cancer. Cervical cancer. Colorectal cancer. Skin cancer. Lung cancer. What should I know about heart disease, diabetes, and high blood pressure? Blood pressure and heart  disease High blood pressure causes heart disease and increases the risk of stroke. This is more likely to develop in people who have high blood pressure readings or are overweight. Have your blood pressure checked: Every 3-5 years if you are 9-66 years of age. Every year if you are 68 years old or older. Diabetes Have regular diabetes screenings. This checks your fasting blood sugar level. Have the screening done: Once every three years after age 62 if you are at a normal weight and have a low risk for diabetes. More often and at a younger age if you are overweight or have a high risk for diabetes. What should I know about preventing infection? Hepatitis B If you have a higher risk for hepatitis B, you should be screened for this virus. Talk with your health care provider to find out if you are at risk for hepatitis B infection. Hepatitis C Testing is recommended for: Everyone born from [birth year] through 1966. Anyone with known risk factors for hepatitis C. Sexually transmitted infections (STIs) Get screened for STIs, including gonorrhea and chlamydia, if: You are sexually active and are younger than 45 years of age. You are older than 45 years of age and your health care provider tells you that you are at risk for this type of infection. Your sexual activity has changed since you were last screened, and you are at increased risk for chlamydia or gonorrhea. Ask your health care provider if you are at risk. Ask your health care provider about whether you are at high risk for HIV. Your health care provider may recommend a prescription medicine to help prevent  HIV infection. If you choose to take medicine to prevent HIV, you should first get tested for HIV. You should then be tested every 3 months for as long as you are taking the medicine. Pregnancy If you are about to stop having your period (premenopausal) and you may become pregnant, seek counseling before you get pregnant. Take 400 to 800  micrograms (mcg) of folic acid  every day if you become pregnant. Ask for birth control (contraception) if you want to prevent pregnancy. Osteoporosis and menopause Osteoporosis is a disease in which the bones lose minerals and strength with aging. This can result in bone fractures. If you are 63 years old or older, or if you are at risk for osteoporosis and fractures, ask your health care provider if you should: Be screened for bone loss. Take a calcium  or vitamin D supplement to lower your risk of fractures. Be given hormone replacement therapy (HRT) to treat symptoms of menopause. Follow these instructions at home: Alcohol use Do not drink alcohol if: Your health care provider tells you not to drink. You are pregnant, may be pregnant, or are planning to become pregnant. If you drink alcohol: Limit how much you have to: 0-1 drink a day. Know how much alcohol is in your drink. In the [place] one drink equals one 12 oz bottle of beer (355 mL), one 5 oz glass of wine (148 mL), or one 1 oz glass of hard liquor (44 mL). Lifestyle Do not use any products that contain nicotine or tobacco. These products include cigarettes, chewing tobacco, and vaping devices, such as e-cigarettes. If you need help quitting, ask your health care provider. Do not use street drugs. Do not share needles. Ask your health care provider for help if you need support or information about quitting drugs. General instructions Schedule regular health, dental, and eye exams. Stay current with your vaccines. Tell your health care provider if: You often feel depressed. You have ever been abused or do not feel safe at home. Summary Adopting a healthy lifestyle and getting preventive care are important in promoting health and wellness. Follow your health care provider's instructions about healthy diet, exercising, and getting tested or screened for diseases. Follow your health care provider's instructions on monitoring your  cholesterol and blood pressure. This information is not intended to replace advice given to you by your health care provider. Make sure you discuss any questions you have with your health care provider. Document Revised: 07/31/2020 Document Reviewed: 07/31/2020 Elsevier Patient Education  2024 [employer]    Blood work was ordered.       Medications changes include :   None    A referral was ordered and someone will call you to schedule an appointment.     Return in about 6 months (around 10/10/2024) for follow up.    "

## 2024-04-11 NOTE — Progress Notes (Unsigned)
 "   Subjective:    Patient ID: Kayla Floyd, female    DOB: 10-Aug-1980, 44 y.o.   MRN: [id number]      HPI Kayla Floyd is here for a Physical exam and her chronic medical problems.   Discussed the use of AI scribe software for clinical note transcription with the patient, who gave verbal consent to proceed.  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  She experienced a syncopal episode one to two weeks ago, the first in years. She felt dizzy and lightheaded, usually having enough time to sit down, but during this episode, she passed out while trying to reach her bed, landing face-first on it. She describes feeling presyncopal all the time.  She has a history of POTS and EDS, with symptoms including palpitations, leg swelling, and joint pain. Her joint pain has worsened significantly, and she experiences palpitations that can be painful. She also mentions chest wall pain that feels muscular, shortness of breath, and occasional productive coughs.  Her PCOS symptoms began during nursing school, marked by a rapid weight gain of 20 pounds within a month. She associates her health decline with the onset of PCOS symptoms rather than the later POTS and EDS diagnoses.  She experiences chronic stomach pain, diarrhea, and nausea, with occasional heartburn. She takes omeprazole  and uses Pepcid as needed for heartburn or allergic reactions. She has been on metformin  for a long time.  She reports a significant change in her vision, which has been stable for years. She visited an eye care provider but disagreed with their assessment and plans to seek another opinion.  She experiences dry skin with severe cracking, particularly on her fingers and knuckles, which she describes as 'severe' and resistant to moisturizers.  Her sleep is disrupted, and she is more active than in previous years,  primarily due to chasing her child. She attempts small exercises while watching her child but lacks structured exercise time.  She reports stable anxiety and depression, with recent minor anxiety spikes but no unmanaged levels. No fever, blood in stool, pain with urination, or rashes, but notes dry skin issues.      Medications and allergies reviewed with patient and updated if appropriate.  Current Outpatient Medications on File Prior to Visit  Medication Sig Dispense Refill   acetaminophen  (TYLENOL ) 325 MG tablet Take 2 tablets (650 mg total) by mouth every 6 (six) hours as needed (pain).     acyclovir  ointment (ZOVIRAX ) 5 % Apply 1 Application topically every 6 (six) hours. 15 g 0   atenolol  (TENORMIN ) 25 MG tablet Take 1 tablet (25 mg total) by mouth 2 (two) times daily. 180 tablet 1   atomoxetine  (STRATTERA ) 40 MG capsule TAKE 1 CAPSULE (40 MG TOTAL) BY MOUTH DAILY. 90 capsule 1   atorvastatin  (LIPITOR) 10 MG tablet TAKE 1 TABLET BY MOUTH EVERY DAY 90 tablet 2   AUVELITY  45-105 MG TBCR TAKE 1 TABLET BY MOUTH TWICE A DAY IN THE MORNING AND IN THE EVENING 60 tablet 5   b complex vitamins tablet Take 1 tablet by mouth daily.     baclofen  (LIORESAL ) 10 MG tablet TAKE 1 TABLET BY MOUTH THREE TIMES A DAY AS NEEDED FOR MUSCLE SPASM 270 tablet 1   botulinum toxin Type A  (BOTOX ) 200 units injection Inject 155 Units into the muscle every 3 (three) months. Inject 155 units into the  muscles of head, and neck every 3 months discard remainder 1 each 2   cetirizine (ZYRTEC) 10 MG tablet Take 10 mg by mouth daily. Reported on 07/13/2015     EPINEPHrine  0.3 mg/0.3 mL IJ SOAJ injection Inject 0.3 mg into the muscle as needed for anaphylaxis. 1 each 5   fenofibrate  (TRICOR ) 145 MG tablet TAKE 1 TABLET (145MG  TOTAL) BY MOUTH EVERY DAY. 90 tablet 1   hydrOXYzine (ATARAX) 50 MG tablet TAKE 1 - 2 TABLETS BY MOUTH ONCE DAILY AS NEEDED FOR ANXIETY/SLEEP 180 tablet 1   hyoscyamine  (LEVSIN  SL) 0.125 MG SL tablet  Take 1 tablet (0.125 mg total) by mouth every 6 (six) hours as needed. PLACE 1 TABLET UNDER THE TONGUE EVERY 6 (SIX) HOURS AS NEEDED. 120 tablet 1   metFORMIN  (GLUCOPHAGE ) 500 MG tablet Take 1 tablet (500 mg total) by mouth 2 (two) times daily with a meal. 180 tablet 2   Multiple Vitamin (MULTIVITAMIN) tablet Take 1 tablet by mouth daily.       nitrofurantoin , macrocrystal-monohydrate, (MACROBID ) 100 MG capsule Take 1 capsule (100 mg total) by mouth as needed. 30 capsule 1   norethindrone -ethinyl estradiol  (AUROVELA 1/20) 1-20 MG-MCG tablet Take 1 tablet by mouth daily. 84 tablet 3   omeprazole  (PRILOSEC) 40 MG capsule TAKE 1 CAPSULE BY MOUTH EVERY DAY 30 capsule 5   ondansetron  (ZOFRAN ) 8 MG tablet Take 1 tablet (8 mg total) by mouth 2 (two) times daily as needed for nausea or vomiting. 20 tablet 0   pregabalin  (LYRICA ) 150 MG capsule TAKE 1 CAPSULE BY MOUTH TWICE A DAY 180 capsule 0   Probiotic Product (PROBIOTIC-10 PO) Take 1 capsule by mouth daily.     promethazine  (PHENERGAN ) 12.5 MG tablet Take by mouth daily as needed.     rizatriptan  (MAXALT ) 10 MG tablet Take 1 tablet (10 mg total) by mouth as needed for migraine. May repeat in 2 hours if needed 10 tablet 11   tiZANidine  (ZANAFLEX ) 4 MG tablet TAKE 1 TABLET BY MOUTH EVERY 8 HOURS AS NEEDED FOR MUSCLE SPASMS 60 tablet 5   valACYclovir  (VALTREX ) 1000 MG tablet TAKE 1 TABLET BY MOUTH TWICE A DAY 60 tablet 0   No current facility-administered medications on file prior to visit.    Review of Systems  Constitutional:  Negative for fever.  Eyes:  Positive for visual disturbance (changes).  Respiratory:  Positive for shortness of breath. Negative for cough and wheezing.   Cardiovascular:  Positive for chest pain (occ with palpitations), palpitations and leg swelling.  Gastrointestinal:  Positive for abdominal pain (chronic), diarrhea and nausea. Negative for blood in stool and constipation.       Occ gerd  Genitourinary:  Negative for  dysuria.  Musculoskeletal:  Positive for arthralgias and back pain.  Skin:  Negative for rash.  Neurological:  Positive for dizziness and light-headedness. Negative for headaches.  Psychiatric/Behavioral:  Negative for dysphoric mood (controlled). The patient is nervous/anxious.        Objective:   Vitals:   04/12/24 1542  BP: 116/78  Pulse: (!) 117  Temp: 98.6 F (37 C)  SpO2: 100%   Filed Weights   04/12/24 1542  Weight: 185 lb (83.9 kg)   Body mass index is 30.79 kg/m.  BP Readings from Last 3 Encounters:  04/12/24 116/78  03/30/24 127/72  12/31/23 122/72    Wt Readings from Last 3 Encounters:  04/12/24 185 lb (83.9 kg)  08/24/23 182 lb (82.6 kg)  08/15/23 182 lb (  82.6 kg)       Physical Exam Constitutional: She appears well-developed and well-nourished. No distress.  HENT:  Head: Normocephalic and atraumatic.  Right Ear: External ear normal. Normal ear canal and TM Left Ear: External ear normal.  Normal ear canal and TM Mouth/Throat: Oropharynx is clear and moist.  Eyes: Conjunctivae normal.  Neck: Neck supple. No tracheal deviation present. No thyromegaly present.  No carotid bruit  Cardiovascular: Normal rate, regular rhythm and normal heart sounds.   No murmur heard.  No edema. Pulmonary/Chest: Effort normal and breath sounds normal. No respiratory distress. She has no wheezes. She has no rales.  Breast: deferred   Abdominal: Soft. She exhibits no distension. There is no tenderness.  Lymphadenopathy: She has no cervical adenopathy.  Skin: Skin is warm and dry. She is not diaphoretic.  Psychiatric: She has a normal mood and affect. Her behavior is normal.   Diabetic Foot Exam - Simple   Simple Foot Form Diabetic Foot exam was performed with the following findings: Yes 04/12/2024  4:12 PM  Visual Inspection No deformities, no ulcerations, no other skin breakdown bilaterally: Yes Sensation Testing Intact to touch and monofilament testing bilaterally:  Yes Pulse Check Posterior Tibialis and Dorsalis pulse intact bilaterally: Yes Comments      Lab Results  Component Value Date   WBC 5.6 08/24/2023   HGB 10.2 (L) 08/24/2023   HCT 32.7 (L) 08/24/2023   PLT 342 08/24/2023   GLUCOSE 107 (H) 08/24/2023   CHOL 196 08/15/2023   TRIG 269.0 (H) 08/15/2023   HDL 41.00 08/15/2023   LDLDIRECT 108.0 07/16/2022   LDLCALC 101 (H) 08/15/2023   ALT 18 08/24/2023   AST 25 08/24/2023   NA 138 08/24/2023   K 3.8 08/24/2023   CL 101 08/24/2023   CREATININE 0.78 08/24/2023   BUN 9 08/24/2023   CO2 21 (L) 08/24/2023   TSH 1.44 02/14/2023   INR 0.9 10/17/2020   HGBA1C 6.4 08/15/2023         Assessment & Plan:   Physical exam: Screening blood work  ordered Exercise  none Weight  obese Substance abuse  none   Reviewed recommended immunizations.   Health Maintenance  Topic Date Due   OPHTHALMOLOGY EXAM  Never done   Diabetic kidney evaluation - Urine ACR  Never done   HEMOGLOBIN A1C  02/15/2024   COVID-19 Vaccine (4 - 2025-26 season) 04/27/2024 (Originally 11/24/2023)   Influenza Vaccine  06/22/2024 (Originally 10/24/2023)   Pneumococcal Vaccine (1 of 2 - PCV) 04/12/2025 (Originally 01/04/1999)   Medicare Annual Wellness (AWV)  06/24/2024   Mammogram  08/19/2024   Diabetic kidney evaluation - eGFR measurement  08/23/2024   FOOT EXAM  04/12/2025   Cervical Cancer Screening (HPV/Pap Cotest)  06/25/2028   DTaP/Tdap/Td (10 - Td or Tdap) 09/09/2031   HPV VACCINES (No Doses Required) Completed   Hepatitis C Screening  Completed   HIV Screening  Completed   Meningococcal B Vaccine  Aged Out   Hepatitis B Vaccines 19-59 Average Risk  Discontinued          See Problem List for Assessment and Plan of chronic medical problems.     "

## 2024-04-12 ENCOUNTER — Ambulatory Visit: Admitting: Internal Medicine

## 2024-04-12 VITALS — BP 116/78 | HR 117 | Temp 98.6°F | Ht 65.0 in | Wt 185.0 lb

## 2024-04-12 DIAGNOSIS — E66811 Obesity, class 1: Secondary | ICD-10-CM

## 2024-04-12 DIAGNOSIS — M549 Dorsalgia, unspecified: Secondary | ICD-10-CM

## 2024-04-12 DIAGNOSIS — G8929 Other chronic pain: Secondary | ICD-10-CM

## 2024-04-12 DIAGNOSIS — E785 Hyperlipidemia, unspecified: Secondary | ICD-10-CM

## 2024-04-12 DIAGNOSIS — E1169 Type 2 diabetes mellitus with other specified complication: Secondary | ICD-10-CM

## 2024-04-12 DIAGNOSIS — G901 Familial dysautonomia [Riley-Day]: Secondary | ICD-10-CM

## 2024-04-12 DIAGNOSIS — Z0000 Encounter for general adult medical examination without abnormal findings: Secondary | ICD-10-CM | POA: Diagnosis not present

## 2024-04-12 DIAGNOSIS — G43709 Chronic migraine without aura, not intractable, without status migrainosus: Secondary | ICD-10-CM

## 2024-04-12 DIAGNOSIS — F3289 Other specified depressive episodes: Secondary | ICD-10-CM

## 2024-04-12 DIAGNOSIS — K219 Gastro-esophageal reflux disease without esophagitis: Secondary | ICD-10-CM | POA: Diagnosis not present

## 2024-04-12 DIAGNOSIS — G4709 Other insomnia: Secondary | ICD-10-CM

## 2024-04-12 DIAGNOSIS — E282 Polycystic ovarian syndrome: Secondary | ICD-10-CM

## 2024-04-12 DIAGNOSIS — D649 Anemia, unspecified: Secondary | ICD-10-CM

## 2024-04-12 DIAGNOSIS — F419 Anxiety disorder, unspecified: Secondary | ICD-10-CM

## 2024-04-12 DIAGNOSIS — F900 Attention-deficit hyperactivity disorder, predominantly inattentive type: Secondary | ICD-10-CM | POA: Diagnosis not present

## 2024-04-12 MED ORDER — METFORMIN HCL 500 MG PO TABS: 500.0000 mg | ORAL_TABLET | Freq: Two times a day (BID) | ORAL | 2 refills | Status: AC

## 2024-04-12 MED ORDER — TIRZEPATIDE 2.5 MG/0.5ML ~~LOC~~ SOAJ: 2.5000 mg | SUBCUTANEOUS | 0 refills | Status: AC

## 2024-04-12 MED ORDER — OMEPRAZOLE 40 MG PO CPDR: 40.0000 mg | DELAYED_RELEASE_CAPSULE | Freq: Every day | ORAL | 5 refills | Status: AC

## 2024-04-12 MED ORDER — ATENOLOL 25 MG PO TABS: 25.0000 mg | ORAL_TABLET | Freq: Two times a day (BID) | ORAL | 1 refills | Status: AC

## 2024-04-12 MED ORDER — PREGABALIN 150 MG PO CAPS: 150.0000 mg | ORAL_CAPSULE | Freq: Two times a day (BID) | ORAL | 0 refills | Status: AC

## 2024-04-12 MED ORDER — FENOFIBRATE 145 MG PO TABS: ORAL_TABLET | ORAL | 1 refills | Status: AC

## 2024-04-12 MED ORDER — HYOSCYAMINE SULFATE 0.125 MG SL SUBL: 0.1250 mg | SUBLINGUAL_TABLET | Freq: Four times a day (QID) | SUBLINGUAL | 1 refills | Status: AC | PRN

## 2024-04-12 NOTE — Assessment & Plan Note (Signed)
 Chronic Regular exercise and healthy diet encouraged Check lipid panel, cmp Continue atorvastatin 10 mg daily, fenofibrate 145 mg daily

## 2024-04-12 NOTE — Assessment & Plan Note (Signed)
 Chronic GERD controlled Continue omeprazole 40 mg daily

## 2024-04-12 NOTE — Assessment & Plan Note (Signed)
 Chronic Taking iron daily Check CBC, B12, iron panel

## 2024-04-12 NOTE — Assessment & Plan Note (Signed)
 Chronic no longer be following with psychiatry  Continue Auvelity  45-105 mg twice daily, hydroxyzine 50-100 mg nightly as needed

## 2024-04-12 NOTE — Assessment & Plan Note (Signed)
 Chronic Associated with hyperlipidemia Lab Results  Component Value Date   HGBA1C 6.4 08/15/2023   Check A1c, urine / microalbumin ratio Low sugar / carb diet Stressed regular exercise Continue metformin  500 mg twice daily Start mounjaro  2.5 mg weekly - discussed at length side effects including worsening of gastroparesis, constipation, gerd, diarrhea, pancreatitis.  Discussed potential vision issues, mood changes.  Stressed regular exercise and increased protein intake

## 2024-04-12 NOTE — Assessment & Plan Note (Signed)
 Chronic Continue Lyrica  150 mg twice daily, tizanidine  4 mg 3 times daily as needed or baclofen  10 mg 3 times daily-does not take both at the same time

## 2024-04-12 NOTE — Assessment & Plan Note (Signed)
 Chronic BMI 30.79 with hyperlipidemia, DM Will be starting mounjaro  for DM - reviewed possible side effects at length

## 2024-04-12 NOTE — Assessment & Plan Note (Signed)
 Chronic On OCPs On metformin  500 mg bid Weight loss would help PCOS Will try mounjaro  2.5 mg weekly

## 2024-04-12 NOTE — Assessment & Plan Note (Signed)
 Chronic No longer seeing psychiatry so I will for start prescribing her medication Continue Strattera 40 mg daily

## 2024-04-12 NOTE — Assessment & Plan Note (Signed)
 Chronic Controlled Management per the [facility] Getting botox  Taking Maxalt  10 mg prn

## 2024-04-12 NOTE — Assessment & Plan Note (Signed)
 Chronic No longer seeing psychiatrist and I will prescribe her medications Continue Auvelity  45-105 mg twice daily

## 2024-04-12 NOTE — Assessment & Plan Note (Signed)
 Chronic Follows with cardiology-Dr. Fernande - will need to establish with a new cardiologist Continue atenolol  25 mg bid-cannot take it sometimes secondary to low BP Not wearing compression socks regulalry Stressed increase water intake Stressed regular exercise

## 2024-04-13 LAB — FOLATE: Folate: 19.3 ng/mL

## 2024-04-13 LAB — CBC
HCT: 31.4 % — ABNORMAL LOW (ref 36.0–46.0)
Hemoglobin: 10 g/dL — ABNORMAL LOW (ref 12.0–15.0)
MCHC: 32 g/dL (ref 30.0–36.0)
MCV: 67.5 fl — ABNORMAL LOW (ref 78.0–100.0)
Platelets: 419 K/uL — ABNORMAL HIGH (ref 150.0–400.0)
RBC: 4.65 Mil/uL (ref 3.87–5.11)
RDW: 17 % — ABNORMAL HIGH (ref 11.5–15.5)
WBC: 6.1 K/uL (ref 4.0–10.5)

## 2024-04-13 LAB — LIPID PANEL
Cholesterol: 188 mg/dL (ref 28–200)
HDL: 41.2 mg/dL
LDL Cholesterol: 98 mg/dL (ref 10–99)
NonHDL: 146.75
Total CHOL/HDL Ratio: 5
Triglycerides: 242 mg/dL — ABNORMAL HIGH (ref 10.0–149.0)
VLDL: 48.4 mg/dL — ABNORMAL HIGH (ref 0.0–40.0)

## 2024-04-13 LAB — IBC PANEL
Iron: 20 ug/dL — ABNORMAL LOW (ref 42–145)
Saturation Ratios: 2.7 % — ABNORMAL LOW (ref 20.0–50.0)
TIBC: 736.4 ug/dL — ABNORMAL HIGH (ref 250.0–450.0)
Transferrin: 526 mg/dL — ABNORMAL HIGH (ref 212.0–360.0)

## 2024-04-13 LAB — COMPREHENSIVE METABOLIC PANEL WITH GFR
ALT: 12 U/L (ref 3–35)
AST: 18 U/L (ref 5–37)
Albumin: 4.5 g/dL (ref 3.5–5.2)
Alkaline Phosphatase: 30 U/L — ABNORMAL LOW (ref 39–117)
BUN: 9 mg/dL (ref 6–23)
CO2: 26 meq/L (ref 19–32)
Calcium: 10.1 mg/dL (ref 8.4–10.5)
Chloride: 104 meq/L (ref 96–112)
Creatinine, Ser: 0.73 mg/dL (ref 0.40–1.20)
GFR: 100.12 mL/min
Glucose, Bld: 65 mg/dL — ABNORMAL LOW (ref 70–99)
Potassium: 4.3 meq/L (ref 3.5–5.1)
Sodium: 138 meq/L (ref 135–145)
Total Bilirubin: 0.2 mg/dL (ref 0.2–1.2)
Total Protein: 7.9 g/dL (ref 6.0–8.3)

## 2024-04-13 LAB — HEMOGLOBIN A1C: Hgb A1c MFr Bld: 6.1 % (ref 4.6–6.5)

## 2024-04-13 LAB — FERRITIN: Ferritin: 2.7 ng/mL — ABNORMAL LOW (ref 10.0–291.0)

## 2024-04-13 LAB — TSH: TSH: 2.33 u[IU]/mL (ref 0.35–5.50)

## 2024-04-13 LAB — MICROALBUMIN / CREATININE URINE RATIO
Creatinine,U: 68.5 mg/dL
Microalb Creat Ratio: UNDETERMINED mg/g (ref 0.0–30.0)
Microalb, Ur: <0.7 mg/dL

## 2024-04-13 LAB — VITAMIN B12: Vitamin B-12: 266 pg/mL (ref 211–911)

## 2024-04-15 ENCOUNTER — Ambulatory Visit: Payer: Self-pay | Admitting: Internal Medicine

## 2024-04-27 ENCOUNTER — Other Ambulatory Visit: Payer: Self-pay | Admitting: Internal Medicine

## 2024-04-27 DIAGNOSIS — F3341 Major depressive disorder, recurrent, in partial remission: Secondary | ICD-10-CM

## 2024-05-24 ENCOUNTER — Ambulatory Visit: Admitting: Internal Medicine

## 2024-06-28 ENCOUNTER — Encounter: Admitting: Nurse Practitioner

## 2024-06-28 ENCOUNTER — Ambulatory Visit: Admitting: Adult Health

## 2024-06-30 ENCOUNTER — Ambulatory Visit: Admitting: Adult Health

## 2024-08-17 ENCOUNTER — Encounter: Admitting: Internal Medicine

## 2024-08-17 ENCOUNTER — Ambulatory Visit
